# Patient Record
Sex: Male | Born: 1975
Health system: Southern US, Community
[De-identification: ages and names within clinical notes are randomized; demographics above are authoritative.]

## PROBLEM LIST (undated history)

## (undated) DIAGNOSIS — T8859XA Other complications of anesthesia, initial encounter: Secondary | ICD-10-CM

## (undated) DIAGNOSIS — Z9889 Other specified postprocedural states: Secondary | ICD-10-CM

## (undated) DIAGNOSIS — I429 Cardiomyopathy, unspecified: Secondary | ICD-10-CM

## (undated) DIAGNOSIS — I472 Ventricular tachycardia, unspecified: Secondary | ICD-10-CM

## (undated) DIAGNOSIS — H9193 Unspecified hearing loss, bilateral: Secondary | ICD-10-CM

## (undated) DIAGNOSIS — R112 Nausea with vomiting, unspecified: Secondary | ICD-10-CM

## (undated) DIAGNOSIS — E669 Obesity, unspecified: Secondary | ICD-10-CM

## (undated) DIAGNOSIS — I219 Acute myocardial infarction, unspecified: Secondary | ICD-10-CM

## (undated) DIAGNOSIS — I10 Essential (primary) hypertension: Secondary | ICD-10-CM

## (undated) DIAGNOSIS — F419 Anxiety disorder, unspecified: Secondary | ICD-10-CM

## (undated) DIAGNOSIS — Z952 Presence of prosthetic heart valve: Secondary | ICD-10-CM

## (undated) DIAGNOSIS — R55 Syncope and collapse: Secondary | ICD-10-CM

## (undated) DIAGNOSIS — K529 Noninfective gastroenteritis and colitis, unspecified: Secondary | ICD-10-CM

## (undated) DIAGNOSIS — Z95810 Presence of automatic (implantable) cardiac defibrillator: Secondary | ICD-10-CM

## (undated) DIAGNOSIS — R011 Cardiac murmur, unspecified: Secondary | ICD-10-CM

## (undated) DIAGNOSIS — Q210 Ventricular septal defect: Secondary | ICD-10-CM

## (undated) DIAGNOSIS — I499 Cardiac arrhythmia, unspecified: Secondary | ICD-10-CM

## (undated) HISTORY — PX: CORONARY ANGIOPLASTY: SHX604

## (undated) HISTORY — DX: Syncope and collapse: R55

## (undated) HISTORY — DX: Unspecified hearing loss, bilateral: H91.93

## (undated) HISTORY — PX: CARDIAC VALVE REPLACEMENT: SHX585

## (undated) HISTORY — PX: CORONARY ARTERY BYPASS GRAFT: SHX141

## (undated) HISTORY — DX: Ventricular tachycardia: I47.2

## (undated) HISTORY — PX: CARDIAC DEFIBRILLATOR PLACEMENT: SHX171

## (undated) HISTORY — PX: CHOLECYSTECTOMY: SHX55

## (undated) HISTORY — PX: AORTIC VALVE REPLACEMENT: SHX41

## (undated) HISTORY — DX: Ventricular tachycardia, unspecified: I47.20

## (undated) HISTORY — PX: INSERT / REPLACE / REMOVE PACEMAKER: SUR710

## (undated) HISTORY — PX: VSD REPAIR: SHX276

## (undated) HISTORY — DX: Ventricular septal defect: Q21.0

## (undated) HISTORY — DX: Cardiomyopathy, unspecified: I42.9

## (undated) HISTORY — PX: ANGIOPLASTY: SHX39

## (undated) HISTORY — PX: CARDIAC CATHETERIZATION: SHX172

## (undated) HISTORY — DX: Presence of prosthetic heart valve: Z95.2

## (undated) HISTORY — DX: Obesity, unspecified: E66.9

## (undated) HISTORY — DX: Noninfective gastroenteritis and colitis, unspecified: K52.9

## (undated) HISTORY — DX: Anxiety disorder, unspecified: F41.9

---

## 2006-06-28 ENCOUNTER — Other Ambulatory Visit: Payer: Self-pay

## 2006-06-29 ENCOUNTER — Other Ambulatory Visit: Payer: Self-pay

## 2006-06-29 ENCOUNTER — Ambulatory Visit: Payer: Self-pay | Admitting: Internal Medicine

## 2006-06-29 ENCOUNTER — Inpatient Hospital Stay: Payer: Self-pay | Admitting: Internal Medicine

## 2006-06-30 ENCOUNTER — Other Ambulatory Visit: Payer: Self-pay

## 2006-07-01 ENCOUNTER — Other Ambulatory Visit: Payer: Self-pay

## 2006-07-03 ENCOUNTER — Inpatient Hospital Stay (HOSPITAL_COMMUNITY): Admission: EM | Admit: 2006-07-03 | Discharge: 2006-07-06 | Payer: Self-pay | Admitting: Emergency Medicine

## 2006-07-03 ENCOUNTER — Ambulatory Visit: Payer: Self-pay | Admitting: Internal Medicine

## 2006-07-25 ENCOUNTER — Ambulatory Visit: Payer: Self-pay

## 2006-10-15 ENCOUNTER — Ambulatory Visit: Payer: Self-pay | Admitting: Internal Medicine

## 2006-11-13 ENCOUNTER — Ambulatory Visit: Payer: Self-pay | Admitting: Internal Medicine

## 2006-12-30 ENCOUNTER — Ambulatory Visit: Payer: Self-pay | Admitting: Internal Medicine

## 2007-03-11 ENCOUNTER — Emergency Department: Payer: Self-pay | Admitting: Emergency Medicine

## 2007-03-12 ENCOUNTER — Ambulatory Visit: Payer: Self-pay | Admitting: Internal Medicine

## 2007-03-12 ENCOUNTER — Inpatient Hospital Stay: Payer: Self-pay | Admitting: Surgery

## 2007-04-03 ENCOUNTER — Ambulatory Visit: Payer: Self-pay | Admitting: Internal Medicine

## 2007-07-08 ENCOUNTER — Ambulatory Visit: Payer: Self-pay | Admitting: Internal Medicine

## 2007-07-08 ENCOUNTER — Ambulatory Visit: Payer: Self-pay

## 2007-07-08 ENCOUNTER — Encounter: Payer: Self-pay | Admitting: Internal Medicine

## 2007-10-09 ENCOUNTER — Ambulatory Visit: Payer: Self-pay | Admitting: Internal Medicine

## 2008-01-08 ENCOUNTER — Ambulatory Visit: Payer: Self-pay | Admitting: Internal Medicine

## 2008-04-08 ENCOUNTER — Ambulatory Visit: Payer: Self-pay | Admitting: Internal Medicine

## 2008-06-28 ENCOUNTER — Ambulatory Visit: Payer: Self-pay | Admitting: Internal Medicine

## 2008-06-28 ENCOUNTER — Encounter (INDEPENDENT_AMBULATORY_CARE_PROVIDER_SITE_OTHER): Payer: Self-pay | Admitting: *Deleted

## 2008-09-30 ENCOUNTER — Ambulatory Visit: Payer: Self-pay | Admitting: Internal Medicine

## 2008-11-24 ENCOUNTER — Telehealth: Payer: Self-pay | Admitting: Internal Medicine

## 2008-12-30 ENCOUNTER — Ambulatory Visit: Payer: Self-pay | Admitting: Internal Medicine

## 2009-03-09 DIAGNOSIS — Z8679 Personal history of other diseases of the circulatory system: Secondary | ICD-10-CM | POA: Insufficient documentation

## 2009-03-09 DIAGNOSIS — I429 Cardiomyopathy, unspecified: Secondary | ICD-10-CM | POA: Insufficient documentation

## 2009-03-31 ENCOUNTER — Ambulatory Visit: Payer: Self-pay | Admitting: Internal Medicine

## 2009-04-04 ENCOUNTER — Encounter: Payer: Self-pay | Admitting: Internal Medicine

## 2009-04-13 ENCOUNTER — Encounter: Payer: Self-pay | Admitting: Internal Medicine

## 2009-08-08 ENCOUNTER — Ambulatory Visit: Payer: Self-pay | Admitting: Internal Medicine

## 2009-08-08 DIAGNOSIS — Z952 Presence of prosthetic heart valve: Secondary | ICD-10-CM | POA: Insufficient documentation

## 2009-11-10 ENCOUNTER — Ambulatory Visit: Payer: Self-pay | Admitting: Internal Medicine

## 2009-11-11 ENCOUNTER — Encounter: Payer: Self-pay | Admitting: Internal Medicine

## 2010-02-09 ENCOUNTER — Ambulatory Visit: Payer: Self-pay | Admitting: Internal Medicine

## 2010-05-18 ENCOUNTER — Ambulatory Visit: Payer: Self-pay | Admitting: Internal Medicine

## 2010-05-19 ENCOUNTER — Encounter: Payer: Self-pay | Admitting: Internal Medicine

## 2010-05-31 ENCOUNTER — Encounter (INDEPENDENT_AMBULATORY_CARE_PROVIDER_SITE_OTHER): Payer: Self-pay | Admitting: *Deleted

## 2010-06-20 NOTE — Cardiovascular Report (Signed)
Summary: Office Visit Remote   Office Visit Remote   Imported By: Roderic Ovens 11/29/2009 13:31:57  _____________________________________________________________________  External Attachment:    Type:   Image     Comment:   External Document

## 2010-06-20 NOTE — Assessment & Plan Note (Signed)
Summary: F/U 1 YEAR  Medications Added ASPIRIN 81 MG TBEC (ASPIRIN) Take one tablet by mouth every other day      Allergies Added: ! * SHELLFISH  Visit Type:  Follow-up Primary Provider:  Dr Orvan Falconer  CC:  no complaints.  History of Present Illness:   [name] is seen in followup for ventricular tachycardia associated with syncope. This occurred in the context of a repaired VSD and he also has aortic valve replacement with a prosthetic valve. At the time of that surgery he had damage to his right coronary artery he underwent vein angioplasty. He has modest depression of LV function with echo in 2009 demonstrating ejection fraction of 35-45%  He has had no complaints of chest pain or shortness of breath. There've been no intercurrent ICD discharges.  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 Take One Capsule By Mouth Daily 2)  Carvedilol 25 Mg Tabs (Carvedilol) .... Take One Tablet By Mouth Two Times A Day 3)  Aspirin 81 Mg Tbec (Aspirin) .... Take One Tablet By Mouth Every Other Day  Allergies (verified): 1)  ! * Shellfish  Past History:  Family History: Last updated: 03/09/2009 Family History of Hypertension:   Social History: Last updated: 03/09/2009 Full Time Married  Tobacco Use - No.  Alcohol Use - no  Risk Factors: Smoking Status: never (03/09/2009)  Past Medical History: secondary cardiomyopathy ventriculoseptal defect Aortic valve requiring  replacement specifics not available syncope Ventricular tachycardia Status post ICD implantation  Past Surgical History: VSD repair Aortic valve bioprosthesis RCA repair with a vein  angioplasty following injury with the aforementioned surgery  Vital Signs:  Patient profile:   35 year old male Height:      69.5 inches Weight:      187.50 pounds BMI:     27.39 Pulse rate:   67 / minute Pulse rhythm:   regular BP sitting:   110 / 82  (left arm) Cuff size:   large  Vitals Entered By: Mercer Pod (August 08, 2009 10:57 AM)  Physical Exam  General:  The patient was alert and oriented in no acute distress. HEENT Normal.  Neck veins were flat, carotids were brisk.  Lungs were clear.  Heart sounds were regular without murmurs or gallops.  Abdomen was soft with active bowel sounds. There is no clubbing cyanosis or edema. Skin Warm and dry    EKG  Procedure date:  08/08/2009  Findings:      sinus rhythm at 67 Intervals 0.17/0.11/0.41 axis of 60 there is evidence of ventricular hypertrophy with repolarization abnormalities   ICD Specifications Following MD:  Sherryl Manges, MD     ICD Vendor:  [facility] [employer]     ICD Model Number:  T175     ICD Serial Number:  [id number] ICD DOI:  07/04/2006     ICD Implanting MD:  Sherryl Manges, MD  Lead 1:    Location: RV     DOI: 07/04/2006     Model #: [id number]     Serial #: [id number]     Status: active  Indications::  VT   ICD Follow Up Remote Check?  No Battery Voltage:  2.99 V  Charge Time:  8.2 seconds     Battery Est. Longevity:  BOL Underlying rhythm:  SR ICD Dependent:  No       ICD Device Measurements Right Ventricle:  Amplitude: 18.9 mV, Impedance: 675 ohms, Threshold: 1.0 V at 0.5 msec Shock Impedance: 45 ohms   Episodes Shock:  0     ATP:  0     Nonsustained:  2     Ventricular Pacing:  0%  Brady Parameters Mode VVI     Lower Rate Limit:  40      Tachy Zones VF:  240     VT:  200     Next Remote Date:  11/10/2009     Next Cardiology Appt Due:  07/20/2010 Tech Comments:  No parameter changes.  1 VF episode aborted, 2 NSVT.  He will continue with his Latitude transmissions every 3 months and return in  1 year to the [place] office with Dr. Graciela Husbands. Altha Harm, LPN  August 08, 2009 11:19 AM   Impression & Recommendations:  Problem # 1:  VENTRICULAR TACHYCARDIA (ICD-427.1) Patient had recurrent nonsustained ventricular tachycardia identified by his ICD with minimum cycle length of 230 ms. It spontaneously terminated after proximally 6-7 seconds  Problem # 2:  CARDIOMYOPATHY, SECONDARY (ICD-425.9) is well compensated on his current medications. When we see him next year we'll obtain a 2-D echo. Based on the emphasis trauma maybe a role for additional Aldactone. His updated medication list for this problem includes:    Ramipril 5 Mg Caps (Ramipril) .Marland Kitchen... Take one capsule by mouth daily    Carvedilol 25 Mg Tabs (Carvedilol) .Marland Kitchen... Take one tablet by mouth two times a day    Aspirin 81 Mg Tbec (Aspirin) .Marland Kitchen... Take one tablet by mouth every other day  Problem # 3:  IMPLANTABLE  DEFIBRILLATOR  GDT (ICD-V45.02) Device parameters and data were reviewed and no changes were made

## 2010-06-22 NOTE — Letter (Signed)
Summary: Remote Device Check  [facility], Main Office  [address]   [place], [place] [address]   Phone: [phone]  Fax: [phone]     May 31, 2010 MRN: [id number]   [name] [address] [place], [place]  [address]   Dear Mr. Moose,   Your remote transmission was recieved and reviewed by your physician.  All diagnostics were within normal limits for you.   __X____Your next office visit is scheduled for:  March 2012 with Dr Graciela Husbands. Please call our office to schedule an appointment.    Sincerely,  Vella Kohler

## 2010-06-22 NOTE — Letter (Signed)
Summary: Remote Device Check  [facility], Main Office  [address]   [place], [place] [address]   Phone: [phone]  Fax: [phone]     May 31, 2010 MRN: [id number]   [name] [address] [place], [place]  [address]   Dear [name],   Your remote transmission was recieved and reviewed by your physician.  All diagnostics were within normal limits for you.  _____Your next transmission is scheduled for:                       .  Please transmit at any time this day.  If you have a wireless device your transmission will be sent automatically.  ______Your next office visit is scheduled for:                              . Please call our office to schedule an appointment.    Sincerely,  Vella Kohler

## 2010-06-22 NOTE — Cardiovascular Report (Signed)
Summary: Office Visit Remote   Office Visit Remote   Imported By: Roderic Ovens 06/02/2010 11:06:34  _____________________________________________________________________  External Attachment:    Type:   Image     Comment:   External Document

## 2010-08-24 ENCOUNTER — Ambulatory Visit (INDEPENDENT_AMBULATORY_CARE_PROVIDER_SITE_OTHER): Payer: 59 | Admitting: *Deleted

## 2010-08-24 DIAGNOSIS — Z95810 Presence of automatic (implantable) cardiac defibrillator: Secondary | ICD-10-CM

## 2010-08-24 DIAGNOSIS — I472 Ventricular tachycardia, unspecified: Secondary | ICD-10-CM

## 2010-08-24 DIAGNOSIS — R0989 Other specified symptoms and signs involving the circulatory and respiratory systems: Secondary | ICD-10-CM

## 2010-08-24 DIAGNOSIS — I428 Other cardiomyopathies: Secondary | ICD-10-CM

## 2010-08-28 ENCOUNTER — Other Ambulatory Visit: Payer: Self-pay

## 2010-08-28 NOTE — Progress Notes (Signed)
icd remote check  

## 2010-09-10 ENCOUNTER — Encounter: Payer: Self-pay | Admitting: *Deleted

## 2010-09-20 ENCOUNTER — Other Ambulatory Visit: Payer: Self-pay | Admitting: *Deleted

## 2010-09-20 MED ORDER — RAMIPRIL 5 MG PO CAPS: 5.0000 mg | ORAL_CAPSULE | Freq: Every day | ORAL | Status: DC

## 2010-09-20 MED ORDER — CARVEDILOL 25 MG PO TABS: 25.0000 mg | ORAL_TABLET | Freq: Two times a day (BID) | ORAL | Status: DC

## 2010-10-03 NOTE — Progress Notes (Signed)
[facility] OFFICE NOTE   NAME:[name], [name]                       MRN:          [id number]  DATE:06/28/2008                            DOB:          1975-06-19    [name] is seen in followup for syncope in the setting of VSD repair  and a pericardial aortic valve replacement.  He has sustained VT and  underwent ICD implantation.  He has had no recurrent VT.  His exercise  tolerance is quite good.   MEDICATIONS:  1. Altace 5.  2. Aspirin 81.  3. Coreg 25.   On examination, his weight was 196 which is up about 7 pounds in the  last year.  His blood pressure is 118/70, pulse was 65.  His lungs were  clear.  Neck veins were flat.  Heart sounds were regular.  The abdomen  was soft.  The extremities were without edema.   IMPRESSION:  Interrogation of his Guidant vitality ICD demonstrates an R-  wave of 17 with impedance of 657, threshold of 1 volt at 0.5.  High-  voltage impedance was 44.  Battery voltage was 3.15.   There have been no intercurrent therapies.  There is 1 recurrent  nonsustained episode of VT.   IMPRESSION:  1. Ventricular tachycardia with syncope.  2. Congenital heart disease.      a.     Status post ventricular septal defect repair.      b.     Status post aortic valve replacement with prosthetic valve.      c.     Right coronary artery vein angioplasty following damage with       time and surgery.  3. Junctional rhythm, intermittent - today in sinus rhythm.  4. Left ventricular dysfunction with cardiomyopathy.  Ejection      fraction 35%.   [name] is doing really quite well.  His sinus rate is more stable.  We will continue on his current medications with some consideration for  further up titration of Altace at his next visit.  He may well be moving  to [place].  He will let us know at that time if he needs further  Cardiology support.     Duke Salvia, MD,  [facility] [place] [facility]  Electronically Signed    SCK/MedQ  DD: 06/28/2008  DT: 06/29/2008  Job #: [id number]

## 2010-10-03 NOTE — Progress Notes (Signed)
[facility]                  [facility] OFFICE NOTE   NAME:Bates, Fernando                         MRN:          [id number]  DATE:07/08/2007                            DOB:          Dec 20, 1975    [name] is seen in followup for ventricular tachycardia occurring in  the context of congenital heart disease, specifically undergoing aortic  valve replacement for aortic insufficiency in 2002 and subsequently vein  angioplasty for an RCA damaged during that surgery with prior history of  VSD repair.  He has had no recurrent ventricular tachycardia.  He is  doing well without complaints exercise intolerance, shortness of breath,  or chest pain.  He has limited his activities somewhat   CURRENT MEDICATIONS:  1. Altace 5.  2. Aspirin 81.  3. Coreg 25 b.i.d.   PHYSICAL EXAMINATION:  VITAL SIGNS:  On examination today, his blood  pressure is 120/72. His pulse was 78.  His weight was 189.  LUNGS:  Clear.  HEART:  Sounds were regular with an S1 that was variable.  ABDOMEN:  Soft.  EXTREMITIES:  Without edema.   Interrogation of Guidant Vitality ICD demonstrates R-wave of 20 with  impedance of 663, a threshold of 1 volt at 0.5.  Battery voltage 3.2.  There were  new intercurrent therapies.   Electrocardiogram demonstrated junctional rhythm at 81 with intervals of  -/012/0.40. The axis was 60 degrees.  There is evidence of two P waves  in lead III on the first two beats of the tracing potentially coming out  prior to the third beat on the tracing.  After that, I could not see it  along the rhythm strip.   IMPRESSION:  1. Ventricular tachycardia with syncope.  2. Congenital heart disease.      a.     Status post VSD repair.      b.     Status post aortic valve replaced with a prosthetic valve.      c.     Status post right coronary artery vein patch angioplasty       following damage at time of surgery.  3. Junctional rhythm with  question sinus node slowing versus      intermittent sinus node arrest.  4. Cardiomyopathy with ejection fraction previously 35%.   We will plan to check an echocardiogram today to see what his LV  function looks like.  We will continue with current medications as I  think the benefits of Coreg outweigh the potential medication's of  accelerated junctional rhythm.   He has had essentially no ventricular pacing, so while the PP interval  is somewhat shorter than the RR interval, antegrade conduction block may  be evident at times. Ventricular pacing as opposed to junctional escape  is not an issue.   We will see him again in six months' time.  I should note that he is  thinking about moving to [place] Askew, MD, [facility]  Electronically Signed    SCK/MedQ  DD: 07/08/2007  DT: 07/09/2007  Job #: [id number]

## 2010-10-03 NOTE — Assessment & Plan Note (Signed)
[facility]                         ELECTROPHYSIOLOGY OFFICE NOTE   NAME:[name], [name]                       MRN:          [id number]  DATE:11/13/2006                            DOB:          1976/01/04    [name] is seen today.  He has a history of syncope with an ischemic  cardiomyopathy, status post aortic valve replacement, status post VSD  repair and a myocardial infarction that was related, I think, to damage  at the time of one of his surgeries with a coronary artery patch repair.  He is doing quite well; he has no symptoms currently.   MEDICATIONS CURRENTLY INCLUDE:  1. Coreg 12.5 b.i.d.  2. Altace.  3. Aspirin.   PHYSICAL EXAMINATION:  His blood pressure is 110/78 and pulse is 72.  LUNGS:  Clear.  HEART:  Sounds were regular.  EXTREMITIES:  Without edema.   We will plan to increase his Coreg today to 18.75 and we will see him  again in six weeks.  I have asked him to increase it from 18.75 to 25,  if he is tolerating it, in four weeks; and we will see where he goes.   I should also note that he has asked the questions as to whether there  is compensation to which he is entitled based on the damage to his heart  from his surgery.  I told him that I did not know enough about surgical  complications to be able to say anything about this, but if that was  something that he wanted to pursue then he certainly had the right to do  that.     Duke Salvia, MD, [facility]  Electronically Signed    SCK/MedQ  DD: 11/13/2006  DT: 11/14/2006  Job #: [id number]   cc:   Dr. Dorothyann Peng

## 2010-10-03 NOTE — Assessment & Plan Note (Signed)
[facility]                         ELECTROPHYSIOLOGY OFFICE NOTE   NAME:[name], [name]                       MRN:          [id number]  DATE:10/15/2006                            DOB:          [birth year]    [name] ELECTROPHYSIOLOGY NOTE:  Mr. [name] is seen.  He has a history of ventricular tachycardia in the  setting of cardiomyopathy with an ejection fraction of 30% that occurred  in the context of VSD repair, aortic valve replacement that was  complicated by a right coronary artery injury that subsequently required  grafting.  He is status post ICD implantation and is currently taking  Coreg 6.25 and Altace 5 and aspirin 81.   PHYSICAL EXAMINATION:  VITAL SIGNS:  His blood pressure is 137/88, pulse  of 90.  LUNGS:  Clear.  HEART:  Heart sounds were regular.  EXTREMITIES:  Without edema.   Interrogation of his Guidant Vitality T175 ICD indicates an R wave of 22  and impedance of 639, threshold of 0.4 at 0.5.  Battery voltage is 3.23.   IMPRESSION:  1. Ventricular tachycardia.  2. Complex cardiomyopathy.      a.     Status post VSD repair.      b.     Status post aortic valve replacement.      c.     Status post right coronary artery patch repair.      d.     Ejection fraction of 30%.   Mr. [name] is doing well.  Will increase his Coreg to 9.375, and I will  see him again in 4 weeks time to further uptitrate that.     Duke Salvia, MD, [facility]  Electronically Signed    SCK/MedQ  DD: 10/15/2006  DT: 10/15/2006  Job #: [id number]   cc:   Dorothyann Peng, M.D.

## 2010-10-06 NOTE — Op Note (Signed)
NAMECORNELLIUS, KROPP                ACCOUNT NO.:  [id number]   MEDICAL RECORD NO.:  [id number]          PATIENT TYPE:  INP   LOCATION:  3730                         FACILITY:  [facility]   PHYSICIAN:  Duke Salvia, MD, FACCDATE OF BIRTH:  January 31, 1976   DATE OF PROCEDURE:  07/04/2006  DATE OF DISCHARGE:                               OPERATIVE REPORT   PREOPERATIVE DIAGNOSIS:  Ischemic and nonischemic cardiomyopathy, status  post aortic valve replacement, status post ventricular septal defect  repair with ventricular tachycardia and syncope.   POSTOPERATIVE DIAGNOSIS:  Ischemic and nonischemic cardiomyopathy,  status post aortic valve replacement, status post ventricular septal  defect repair with ventricular tachycardia and syncope.   OPERATION/PROCEDURE:  Defibrillator implantation with intraoperative  defibrillation threshold testing.   DESCRIPTION OF PROCEDURE:  Following obtaining informed consent, the  patient was brought to the electrophysiology laboratory and placed on  the fluoroscopic table in supine position.  After routine prep and drape  of the left upper chest, lidocaine was infiltrated in the prepectoral  subclavicular region.  Incision was made and carried down to the layer  of the prepectoral fascia using electrocautery and sharp dissection.  A  pocket was formed similarly.  Hemostasis was obtained.   Thereafter, attention was turned to gain access to the extrathoracic  left subclavian vein which was accomplished with a moderate of  difficulty.  I actually punctured the artery on one occasion and held  pressure for 2 minutes.  I was then able to access the vein and  guidewire was placed and retained and a 9.5-French sheath was placed  through which was then passed a Guidant [employer]  covered lead, serial number [id number].  With a moderate amount of  difficulty, this lead was finally placed in the right ventricular apex.  In this location the bipolar  R wave was 12.1 with a pace impedance of  717 and a threshold of 1 volt at 0.5 without diaphragmatic stimulation  at 10 volts.  The lead was secured in the prepectoral fascia and then  attached to a Guidant Vitality 2T175 ICD, serial number [id number].  Through  the device bipolar R wave was 9.5 with a pace impedance of 657,  threshold of 0.5 milliseconds.  The high-voltage impedance was 36 ohms.   Defibrillation threshold testing was undertaken.  Ventricular  fibrillation was induced via the T-wave shock.  After a total duration  of 5 seconds a 14-joule shock was delivered through a measured  resistance of 34 ohms, failing to terminate ventricular fibrillation.  After a total duration of 11 seconds, a 23-joule shock was delivered  through a measured resistance of 35 ohms, terminating ventricular  fibrillation, restoring sinus rhythm.   After wait of 5-6 minutes, ventricular fibrillation was reinduced via  the T-wave shock.  After a total duration of about 8 seconds, a 23-joule  shock was delivered through a measured resistance of 35 ohms, failing to  terminate ventricular fibrillation.  Then a 316-joule shock was  delivered externally to rescue and restore sinus rhythm.   After waiting 6 minutes, ventricular fibrillation was reinduced.  After  a total duration of about 6 seconds, a 23-joule shock was delivered  through a measured resistance of 35 ohms through a reversed polarity,  terminating ventricular fibrillation, restoring sinus rhythm.  At this  point the device was implanted.  The pocket was copiously irrigated with  antibiotic-containing saline solution.  Hemostasis was assured.  The  lead and the pulse generator were placed in the pocket, secured to the  prepectoral fascia.  The wound was closed in three layers in normal  fashion.  The wound was  washed and dried and a Benzoin and Steri-Strip dressing was applied.  Needle counts, sponge counts and instrument counts were correct  at the  end of the procedure according to staff.  The patient tolerated the  procedure without apparent complication.      Duke Salvia, MD, [facility]  Electronically Signed     SCK/MEDQ  D:  07/04/2006  T:  07/05/2006  Job:  [id number]   cc:   Dr. Gerda Diss _____________  [facility]

## 2010-10-06 NOTE — Consult Note (Signed)
[name], [name]                ACCOUNT NO.:  [id number]   MEDICAL RECORD NO.:  [id number]          PATIENT TYPE:  INP   LOCATION:  3730                         FACILITY:  [facility]   PHYSICIAN:  Heloise Purpura, MD      DATE OF BIRTH:  [birth year]   DATE OF CONSULTATION:  07/04/2006  DATE OF DISCHARGE:                                 CONSULTATION   REQUESTING PHYSICIAN:  Dr. Duke Salvia.   REASON FOR CONSULTATION:  Right scrotal hematoma.   HISTORY:  [name] is a 35 year old gentleman with a history  significant for heart disease.  He has a history of a ventricular septal  defect repair as well as an aortic valve replacement and a history of a  myocardial infarction.  Recently, he was admitted to [facility]  after an acute episode where he became syncopal.  He underwent  evaluation including a cardiac catheterization.  As a complication of  his cardiac catheterization, he did develop a retroperitoneal hematoma  as well as a right scrotal hematoma.  He developed right scrotal pain  and a scrotal ultrasound was performed.  This demonstrated blood flow to  the right testis.  He was therefore taken for inguinal exploration by  Dr. Irineo Axon.  Per the patient's report, the testis was able to be  salvaged.  Since his operation, he has decreased pain in the right  testis.  Although he does still have some discomfort in the region, it  is not nearly as painful as it initially was.  The patient states that  he voids well without complications.  He denies a history of hematuria,  urinary tract infections, urolithiasis, GU malignancy, trauma or  surgery.   PAST MEDICAL HISTORY:  1. History of ventricular septal defect status post repair.  2. Aortic valve repair in 2002.  3. History of myocardial infarction with current ejection fraction of      approximately 30%.   MEDICATIONS:  1. Aspirin.  2. Coreg.  3. Cephazolin.  4. Codeine.  5. Altace.  6. Senokot.  7.  Bactrim.  8. Kanamycin.  9. Lopressor.   ALLERGIES:  SHELLFISH.   FAMILY HISTORY:  Hypertension.   SOCIAL HISTORY:  The patient works as a Engineer, civil (consulting).  He is  married.  He denies tobacco use.   REVIEW OF SYSTEMS:  A complete review of systems was performed.  Pertinent positives are as in history of present illness.  All other  systems are reviewed and otherwise negative.   PHYSICAL EXAM:  Temperature 98.4, heart rate 74, blood pressure 135/84.  Urine output 1200 mL.  CONSTITUTIONAL:  Alert and oriented in no acute distress.  ABDOMEN:  Soft, nondistended, nontender without abdominal masses.  GU:  The patient has a well-healed right inguinal incision.  He has a  normal male phallus without urethral discharge, blood at the urethral  meatus.  He has a normal left testis which is palpably normal without  tenderness or masses.  His right scrotum demonstrates edema with  findings consistent with a large hematoma.  His right testis is not able  to be well palpated.  There are no scrotal skin lesions or penile skin  lesions.   LABORATORY STUDIES:  Serum creatinine 0.9, hemoglobin 11.6.   Outside records:  The patient's outside scrotal ultrasound report was  reviewed which demonstrated no significant blood flow to the right  testis.  Dr. Heywood Footman note is also reviewed in preparation for his  inguinal exploration.  The patient's operative note is not available for  review.   IMPRESSION:  Right scrotal hematoma with history of testicular ischemia,  status post exploration and evacuation of hematoma.   RECOMMENDATIONS:  As the patient currently is not having increased pain  in the right testis, I would recommend a scrotal ultrasound tomorrow to  reevaluate his testicular blood flow.  If the patient does develop acute  increased scrotal pain, a scrotal ultrasound should be obtained on an  urgent basis.  Otherwise, if good flow is demonstrated, the patient can  followup as  scheduled with Dr. Lonna Cobb for further postoperative care.   Thank you very much for this consultation and the opportunity to care  for this nice gentleman.           ______________________________  Heloise Purpura, MD  Electronically Signed     LB/MEDQ  D:  07/04/2006  T:  07/05/2006  Job:  [id number]

## 2010-10-06 NOTE — Discharge Summary (Signed)
[name], [name]                ACCOUNT NO.:  [id number]   MEDICAL RECORD NO.:  [id number]          PATIENT TYPE:  INP   LOCATION:  3730                         FACILITY:  [facility]   PHYSICIAN:  Duke Salvia, MD, FACCDATE OF BIRTH:  [birth year]   DATE OF ADMISSION:  07/03/2006  DATE OF DISCHARGE:  07/04/2006                               DISCHARGE SUMMARY   ALLERGIES:  SHELLFISH AND DERIVATIVES OF SHELLFISH.   PAST HISTORY PERTINENT TO THIS ADMISSION:  1. VSD repair at age 3.  2. Pericardial tissue aortic valve replacement in 2002 for aortic      insufficiency.  The patient required vein patch angioplasty of the      right coronary artery, which was damaged during dissection down to      the heart secondary to adhesions from BSD repair.   1. History at [facility] from February 9 to February      13.  A:  Admitted with palpitations leading to syncope.  B:  VT on EMS strips requiring DCV 100 J.  C:  A 2-D echocardiogram at [facility] showed ejection fraction 30%.  D:  Abnormal stress test.  E:  Catheterization.  The patient has a large diaphragmatic wall motion  abnormality, ejection fraction 30%.  F:  Post catheterization retroperitoneal bleed with extension to the  scrotum.  G:  Right testicular pain/no flow on ultrasound of right testis February  12.  H:  Status post evacuation of hematoma right scrotum and inguinal tract.   1. History at [facility] from February 13 to      February 16.  A:  Transferred from [facility] to [facility] February 13.  B:  Electrophysiology studies/status post implantation of a Guidant  Vitality 2 single chamber cardioverted defibrillator.  Dr. Sherryl Manges  defibrillator threshold study less than or equal to 23 jewels.  This was  done July 04, 2006.  C:  Neurology consult, Dr. Heloise Purpura.  Scrotal ultrasound was  ordered.  Acceptable blood flow in the right testes, residual  circumferential  hematoma in the right scrotum.  D:  CT angiogram was performed here at [facility] February 15.  Results  are pending.   PROCEDURE:  1. July 04, 2006:  Implant single chamber cardioverted      defibrillator, Dr. Graciela Husbands.  Defibrillator threshold study less than      or equal 23 jewels.  2. Scrotal ultrasound with good flow to the right testes.  Residual      hematoma in the scrotum.  3. CT angiogram, July 05, 2006.  Results are pending.   BRIEF HISTORY:  [name] is a 35 year old male.  He is sitting on an  extensive prior cardiac history.  The patient had a VSD repair at age 72  in [place], [place].  He underwent an aortic valve repair in  2002 for aortic insufficiency.  Somewhere during this period, the  patient sustained a diaphragmatic myocardial infarction, for which he  did not seek medical attention.   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   The patient was in his usual state of health until he presented to the  emergency room at [facility].  He had complained of palpitations, and  then became unconscious.  He regained consciousness within 30 seconds.  Paramedics were called.  On arrival, paramedics found a wide QRS  tachycardia with cycle length of 260 milliseconds.  He underwent urgent  DC cardioversion, which restored sinus rhythm.  The patient was admitted  to [facility].  He had a stress test, which was abnormal.  He  underwent left heart catheterization.  The right coronary artery could  not be engaged during the catheterization, but apparently the left side  was free of occlusive coronary disease.  His ejection fraction by  echocardiogram and catheterization were both said to be 30%.  The  studies also found a large diaphragmatic wall motion abnormality.  Post  catheterization, there was a complication of large retroperitoneal  hematoma with extension  into the scrotum.  He subsequently underwent  evacuation and repair and hematoma in the scrotum, which caused the  ischemic insult to right testes characterized by continued right  testicular pain in this patient.   The patient is admitted for additional evaluation here at [facility],  including CT angiography, electrophysiology study, and likely a  cardioverted defibrillator implantation.   HOSPITAL COURSE:  The patient was admitted and transferred from [facility] on February 13 for CT angiography and electrophysiology study.  The op report from 2003 when the patient had aortic valve replacement  for aortic insufficiency was examined.  It was noted that the patient  received iatrogenic injury to the right coronary artery, for which a  vein patch angioplasty was required.  There was a comment by Dr. Graciela Husbands  on the chart here that there was a nanomelous takeoff of the right  coronary artery between the great vessels.  The patient underwent  electrophysiology study with implantation of cardioverted defibrillator,  a Guidant Vitality 2 single chamber defibrillator on February 14.  Defibrillator threshold study was less than or equal to 23 jewels.  The  patient did have inducible VT/V flutter.  The patient was seen also on  February 14 by Dr. Heloise Purpura, a urologist.  He recommended a repeat  of the scrotal ultrasound.  The study showed that there was good blood  flow to the left testes, and there was residual scrotal hematoma  circumferential to the testes.  On the morning of February 15, the  patient also had CT angiogram.  The results of this study are pending.  The patient has been maintained on Bactrim-DS for 2 out of the 7 days  that was recommended that he pursue this course.   He also is having ongoing constipation.  He has not had a bowel movement  since Thursday, February 7, and efforts will be made to stimulate bowel function once again prior to his discharge.  He has been  having on  February 15, intermittent spikes of fever.  Temperature was 100.9 in the  morning, spiked again at 101 during the day.  In the afternoon,  temperature was 99.  Dr. Graciela Husbands was consulted and thought that maybe this  would be secondary to the large hematoma this patient has sustained with  pyogens release.  He will be kept until February 16.  Enemas and mag  sulfate will be applied.  The patient also apparently does  given himself  enemas at home.  He was admitted on metoprolol, and this has been  changed to Coreg this hospitalization.   DISCHARGE MEDICATIONS:  He discharged on the following medications:  1. Altace 5 mg daily.  2. Enteric coated aspirin 81 mg daily.  3. Bactrim-DS 1 tab in the morning, 1 tab in the evening for 5 more      days.  4. Coreg 6.25 mg twice daily.   FOLLOWUP:  He has an office visit with Dr. Juliann Pares Friday, February 22  at 10:00 in the morning.  The patient knows to get in touch with Dr.  Lonna Cobb for followup urology visit, and he will have visits pertinent to  implantation of his ICD here at [facility] at [address].  He will see [place] at the ICD clinic in 2 weeks, and he will see  Dr. Graciela Husbands in 3 months.  Dr. Odessa Fleming office will call with those office.   Discharge greater than 45 minutes.  In addition, the patient has had  discussion of mobility of the arm affected by the cardioverted  defibrillator.  He is asked not to drive for the next 6 months.  He is  to keep the incision dry for the next 7 days, and then sponge bathe  until Thursday, February 21.      Maple Mirza, [place]      Duke Salvia, MD, [facility]  Electronically Signed    GM/MEDQ  D:  07/05/2006  T:  07/07/2006  Job:  [id number]   cc:   Duke Salvia, MD, [facility]  Dr. Juliann Pares  Dr. Orvan Falconer

## 2010-10-06 NOTE — Op Note (Signed)
[name], [name]                ACCOUNT NO.:  [id number]   MEDICAL RECORD NO.:  [id number]          PATIENT TYPE:  INP   LOCATION:  3730                         FACILITY:  [facility]   PHYSICIAN:  Duke Salvia, MD, FACCDATE OF BIRTH:  November 13, 1975   DATE OF PROCEDURE:  07/05/2006  DATE OF DISCHARGE:                               OPERATIVE REPORT   PREOPERATIVE DIAGNOSIS:  Wide-complex tachycardia with prior  ventriculoseptal defect repair and inferior infarct question mechanism  with known anomalous right coronary artery between the great vessels as  well as injury to the right coronary artery at the time of his aortic  valve replacement.   POSTOPERATIVE DIAGNOSIS:  Easily reducible ventricular tachycardia.   DESCRIPTION OF PROCEDURE:  The patient was submitted for noninvasive  elective physiological testing via his ICD.  The R wave was 14.3.  The  impedance was 598.  Threshold was 0.4 volts at 0.5 msec with a high  voltage impedance of 37 ohms.   Program simulation was undertaken via the defibrillator.  Using long  shorts at 400:550:250:210 monomorphic ventricular tachycardia/flutter  was easily inducible.  We should note that the patient's clinical  tachycardia had a cycle length of 240 msec.  This tachycardia that was  induced had a cycle length of 228-230 msec.  It was terminated with  shock therapy.   The patient tolerated the procedure without apparent complication.      Duke Salvia, MD, [facility]  Electronically Signed     SCK/MEDQ  D:  07/05/2006  T:  07/06/2006  Job:  [id number]   cc:   Dr. Denyse Amass  Electrophysiology Laboratory  [facility]

## 2010-10-06 NOTE — Assessment & Plan Note (Signed)
[facility]                         ELECTROPHYSIOLOGY OFFICE NOTE   NAME:Stansel, JARIUS DIEUDONNE                       MRN:          [id number]  DATE:07/25/2006                            DOB:          08-24-1975    Mr. Richman was seen today in the clinic on the 6th of March, 2008, for  follow up wound check of his newly implanted Guidant Model #T175  Vitality.  Date of implant was July 04, 2006, for ischemic  cardiomyopathy.  On interrogation of his device today, his battery  voltage was 3.24 with a charge time of 6.9 seconds.  R waves measures  17.3 mV with a ventricular capture threshold of less than 0.2 v at 0.5  milliseconds and a ventricular lead impedance of 542 ohms, shock  impedance was 41.  There were 4 induced VF shocks and 1 diverted; again,  all induced at the time of implant, no other episodes noted.  His Steri-  Strips were removed today, his wound was without redness or edema and he  will follow up with Dr. Graciela Husbands in May.      Altha Harm, LPN  Electronically Signed      Duke Salvia, MD, [facility]  Electronically Signed   PO/MedQ  DD: 07/25/2006  DT: 07/25/2006  Job #: [id number]

## 2010-10-06 NOTE — Assessment & Plan Note (Signed)
[facility]                                 ON-CALL NOTE   NAME:LEWISTylor, Courtwright                       MRN:          [id number]  DATE:05/26/2007                            DOB:          01/09/1976    SUPERVISING PHYSICIAN:  Dr. Tenny Craw.   HISTORY:  Mr. Comes called this evening, very upset because his pharmacy  has faxed over authorizations for refill of his Coreg, and it has not  been authorized.  He is calling this evening, because tonight he does  not have a dose.  I have contacted [facility] at [phone] and authorized  refill of Coreg 25 mg twice daily, #60 tablets, with six refills.      Joellyn Rued, PA-C  Electronically Signed      Duke Salvia, MD, [facility]  Electronically Signed   EW/MedQ  DD: 05/26/2007  DT: 05/27/2007  Job #: [id number]

## 2010-10-06 NOTE — H&P (Signed)
[name], [name]                ACCOUNT NO.:  [id number]   MEDICAL RECORD NO.:  [id number]          PATIENT TYPE:  INP   LOCATION:  1846                         FACILITY:  [facility]   PHYSICIAN:  Doylene Canning. Ladona Ridgel, MD    DATE OF BIRTH:  Nov 14, 1975   DATE OF ADMISSION:  07/03/2006  DATE OF DISCHARGE:                              HISTORY & PHYSICAL   The patient is admitted in transfer from [facility] for additional evaluation of a wide QRS tachycardia, anomalous  coronary circulation, history of VSD status post repair, and history of  aortic valve insufficiency status post repair.   HISTORY OF PRESENT ILLNESS:  The patient is a very pleasant 35 year old  man who has an extensive cardiac history.  Basically, the patient  appears to have had a VSD repair at age 62 in [place], [place].  The patient tolerated this nicely.  He underwent aortic valve  repair in 2002, the details of which we also do not know.  Somewhere  during this period of time, the patient sustained a diaphragmatic  myocardial infarction for which he did not seek medical attention.  He  apparently has an anomalous coronary artery.  He was in his usual state  of health until presenting to the emergency department several days ago.  The patient's wife, who is a Engineer, civil (consulting) at [employer], noted that he  complained of palpitations and then became unconscious.  Within about 30  seconds, the patient regained consciousness and the paramedics were  called.  On arrival, he was found to be in a wide QRS tachycardia  described as a right bundle branch block tachycardia at cycle length of  260 milliseconds.  He underwent urgent DC cardioversion restoring sinus  rhythm.  The patient was admitted to the hospital and underwent  catheterization after a stress test was abnormal.  His right coronary  artery could not be engaged during the catheterization, his left system  was apparently free of occlusive  coronary disease, and his EF by echo  and cath were said to be 30%.  There is a large diaphragmatic wall  motion abnormality.  The catheterization was complicated by a large  retroperitoneal hematoma as well as a scrotal hematoma for which he also  had to undergo surgical repair.  He is admitted for additional  evaluation including CT coronary angiography, EP testing, and likely ICD  implantation.  The patient's additional past medical history is  unremarkable.   FAMILY HISTORY:  Notable for hypertension.   SOCIAL HISTORY:  The patient works as a Engineer, civil (consulting).  He is  married.  He denies tobacco or ethanol abuse.  He drinks rarely.   REVIEW OF SYSTEMS:  Negative including no real history of active  congestive heart failure.  He has never had syncope before.  Interestingly enough, the patient does admit to having palpitations in  the past which were apparently worked up but no obvious findings.   PHYSICAL EXAMINATION:  GENERAL:  Pleasant well appearing young man in no  distress.  VITAL SIGNS:  Blood pressure was  130/70, pulse was 80 and regular,  respirations were 18.  HEENT: Normocephalic and atraumatic.  Pupils are round.  Oropharynx  moist.  Sclerae are anicteric.  NECK:  No jugular distension.  There is no thyromegaly.  The trachea is  midline.  Carotid are 2+ and symmetric.  There is no jugular  venous  distension.  LUNGS: Clear bilaterally to auscultation and no wheezes, rales or  rhonchi.  CARDIOVASCULAR:  Exam revealed a grade 2/6 systolic murmur at the right  upper sternal border.  A2 was well heard.  There is a grade 1/6  diastolic murmur at the right upper sternal border.  There was an LV  lift present.  There was no significant murmurs in the apex of the  heart.  The PMI was enlarged and laterally displaced, however.  ABDOMEN:  Soft, nontender.  There is no organomegaly.  Bowel sounds are  present.  There is no rebound or guarding.  EXTREMITIES:  Demonstrated no  cyanosis, clubbing or edema.  There is a  fresh scar in his right groin and the scrotal area.  NEUROLOGIC:  Alert and oriented X3.  Cranial nerves were intact.  Strength is 5/5 and symmetric.   There is no new EKG.  The prior EKG demonstrates a right bundle branch  block tachycardia with positive precordial concordance and a QRS  duration of 140 milliseconds.  The axis was superior.   IMPRESSION:  1. Probable ventricular tachycardia with syncope requiring emergent      defibrillation.  2. History of VSD repair at [place], [place], approximately      16 years ago, details of which are unavailable.  3. History of tissue aortic valve replacement in 2002 at [facility].  4. Anomalous right coronary artery.  5. Diaphragmatic MI, age undetermined.  6. Class I congestive heart failure, EF 30%.  7. Retroperitoneal/scrotal hematoma status post evacuation.   PLAN:  Admit the patient, obtain CT coronary angiography to define his  coronary circulation, and then to proceed with EP study with likely ICD  implantation.      Doylene Canning. Ladona Ridgel, MD  Electronically Signed     GWT/MEDQ  D:  07/03/2006  T:  07/03/2006  Job:  [id number]

## 2010-10-31 ENCOUNTER — Encounter: Payer: Self-pay | Admitting: Internal Medicine

## 2010-11-03 ENCOUNTER — Encounter: Payer: Self-pay | Admitting: Internal Medicine

## 2010-11-06 ENCOUNTER — Encounter: Payer: Self-pay | Admitting: Internal Medicine

## 2010-11-06 ENCOUNTER — Ambulatory Visit (INDEPENDENT_AMBULATORY_CARE_PROVIDER_SITE_OTHER): Payer: 59 | Admitting: Internal Medicine

## 2010-11-06 DIAGNOSIS — I472 Ventricular tachycardia: Secondary | ICD-10-CM

## 2010-11-06 DIAGNOSIS — I2510 Atherosclerotic heart disease of native coronary artery without angina pectoris: Secondary | ICD-10-CM

## 2010-11-06 DIAGNOSIS — I429 Cardiomyopathy, unspecified: Secondary | ICD-10-CM

## 2010-11-06 DIAGNOSIS — Z95810 Presence of automatic (implantable) cardiac defibrillator: Secondary | ICD-10-CM | POA: Insufficient documentation

## 2010-11-06 LAB — BASIC METABOLIC PANEL
CO2: 25 mEq/L (ref 19–32)
Chloride: 106 mEq/L (ref 96–112)
Creat: 0.81 mg/dL (ref 0.50–1.35)

## 2010-11-06 LAB — MAGNESIUM: Magnesium: 1.8 mg/dL (ref 1.5–2.5)

## 2010-11-06 NOTE — Progress Notes (Signed)
  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 This occurred in the context of a repaired VSD and he also has aortic valve replacement with a prosthetic valve. At the time of that surgery he had damage to his right coronary artery he underwent vein angioplasty. He has modest depression of LV function with echo in 2009 demonstrating ejection fraction of 35-45%  He is well except that he is exhaustedas he is going to school and working third shift  Past Medical History  Diagnosis Date  . Secondary cardiomyopathy   . Ventricular septal defect   . Aortic valve replaced     Requiring replacement, specifics not available  . Syncope   . Ventricular tachycardia   . ICD (implantable cardiac defibrillator), single, in situ     S/P implantation    Past Surgical History  Procedure Date  . Vsd repair   . Aortic valve replacement     Bioprosthesis  . Angioplasty     RCA repair with vein angioplasty following injury with the aforementioned surgery    Current Outpatient Prescriptions  Medication Sig Dispense Refill  . carvedilol (COREG) 25 MG tablet Take 1 tablet (25 mg total) by mouth 2 (two) times daily with a meal.  60 tablet  5  . ramipril (ALTACE) 5 MG capsule Take 1 capsule (5 mg total) by mouth daily.  30 capsule  11  . DISCONTD: aspirin 81 MG EC tablet Take 81 mg by mouth daily.          Allergies  Allergen Reactions  . Shellfish Allergy     Review of Systems negative except from HPI and PMH  Physical Exam Well developed and well nourished in no acute distress HENT normal E scleral and icterus clear Neck Supple JVP flat; carotids brisk and full Clear to ausculation Regular rate and rhythm, no murmurs gallops or rub Soft with active bowel sounds No clubbing cyanosis and edema Alert and oriented, grossly normal motor and sensory function Skin Warm and Dry   Assessment and  Plan

## 2010-11-06 NOTE — Assessment & Plan Note (Signed)
We'll reassess his LV function with an echo

## 2010-11-06 NOTE — Patient Instructions (Signed)
TO BE SCHEDULED IN ~3 WEEKS: Your physician has requested that you have an echocardiogram. Echocardiography is a painless test that uses sound waves to create images of your heart. It provides your doctor with information about the size and shape of your heart and how well your heart's chambers and valves are working. This procedure takes approximately one hour. There are no restrictions for this procedure.  We will call you with your lab results that were drawn today.  Your physician recommends that you schedule a follow-up appointment in: 1 year

## 2010-11-06 NOTE — Assessment & Plan Note (Signed)
Recurrent nonsustained polymorphic VT;  Recheck BMET and Mg

## 2010-11-06 NOTE — Assessment & Plan Note (Signed)
The patient's device was interrogated.  The information was reviewed. No changes were made in the programming.    

## 2010-11-06 NOTE — Assessment & Plan Note (Signed)
As above; will consider adding aldosterone antagonism if Ef stil down

## 2011-02-08 ENCOUNTER — Encounter: Payer: 59 | Admitting: *Deleted

## 2011-02-11 ENCOUNTER — Encounter: Payer: Self-pay | Admitting: *Deleted

## 2011-02-15 ENCOUNTER — Ambulatory Visit (INDEPENDENT_AMBULATORY_CARE_PROVIDER_SITE_OTHER): Payer: 59 | Admitting: *Deleted

## 2011-02-15 ENCOUNTER — Encounter: Payer: Self-pay | Admitting: Internal Medicine

## 2011-02-15 DIAGNOSIS — I429 Cardiomyopathy, unspecified: Secondary | ICD-10-CM

## 2011-02-15 DIAGNOSIS — I472 Ventricular tachycardia: Secondary | ICD-10-CM

## 2011-02-16 LAB — REMOTE ICD DEVICE
BATTERY VOLTAGE: 2.61 V
CHARGE TIME: 10.8 s
DEV-0020ICD: NEGATIVE
RV LEAD IMPEDENCE ICD: 631 Ohm
TZAT-0001FASTVT: 2
TZAT-0013FASTVT: 2
TZAT-0018FASTVT: NEGATIVE
TZST-0001FASTVT: 3
TZST-0001FASTVT: 4
TZST-0003FASTVT: 31 J
TZST-0003FASTVT: 31 J
TZST-0003FASTVT: 31 J
VENTRICULAR PACING ICD: 0 pct

## 2011-02-22 ENCOUNTER — Encounter: Payer: Self-pay | Admitting: *Deleted

## 2011-02-22 NOTE — Progress Notes (Signed)
icd remote check  

## 2011-05-17 ENCOUNTER — Encounter: Payer: 59 | Admitting: *Deleted

## 2011-05-21 ENCOUNTER — Other Ambulatory Visit: Payer: Self-pay | Admitting: Internal Medicine

## 2011-05-21 ENCOUNTER — Encounter: Payer: Self-pay | Admitting: Internal Medicine

## 2011-05-21 ENCOUNTER — Ambulatory Visit (INDEPENDENT_AMBULATORY_CARE_PROVIDER_SITE_OTHER): Payer: 59 | Admitting: *Deleted

## 2011-05-21 DIAGNOSIS — I429 Cardiomyopathy, unspecified: Secondary | ICD-10-CM

## 2011-05-21 DIAGNOSIS — I472 Ventricular tachycardia: Secondary | ICD-10-CM

## 2011-05-23 ENCOUNTER — Encounter: Payer: Self-pay | Admitting: *Deleted

## 2011-05-23 LAB — REMOTE ICD DEVICE
BRDY-0002RV: 40 {beats}/min
DEVICE MODEL ICD: 127217
RV LEAD IMPEDENCE ICD: 614 Ohm
TZAT-0001FASTVT: 1
TZAT-0001FASTVT: 2
TZAT-0013FASTVT: 2
TZAT-0018FASTVT: NEGATIVE
TZAT-0018FASTVT: NEGATIVE
TZST-0001FASTVT: 4
TZST-0003FASTVT: 31 J
TZST-0003FASTVT: 31 J

## 2011-05-24 ENCOUNTER — Other Ambulatory Visit: Payer: Self-pay | Admitting: *Deleted

## 2011-05-24 MED ORDER — CARVEDILOL 25 MG PO TABS: 25.0000 mg | ORAL_TABLET | Freq: Two times a day (BID) | ORAL | Status: DC

## 2011-05-24 NOTE — Progress Notes (Signed)
Remote defib check  

## 2011-08-01 ENCOUNTER — Other Ambulatory Visit: Payer: Self-pay | Admitting: Cardiology

## 2011-08-01 DIAGNOSIS — I429 Cardiomyopathy, unspecified: Secondary | ICD-10-CM

## 2011-08-04 ENCOUNTER — Telehealth: Payer: Self-pay | Admitting: Nurse Practitioner

## 2011-08-04 NOTE — Telephone Encounter (Signed)
Pt called stating that earlier today he had sudden onset of fatigue w/o chest pain, sob, palps, or presyncope.  This was followed by an AICD discharge.  He denies any specific complaints at this time, denying c/p, sob, presyncope, fatigue.  I advised that if he feels poorly or if his device goes off again, then he should come into the ER for eval.  OTW, if he feels well and he has no recurrent device discharges he should call the office on Monday AM to arrange for interrogation.  He verbalized understanding.

## 2011-08-06 ENCOUNTER — Encounter: Payer: Self-pay | Admitting: Internal Medicine

## 2011-08-07 ENCOUNTER — Other Ambulatory Visit (INDEPENDENT_AMBULATORY_CARE_PROVIDER_SITE_OTHER): Payer: 59

## 2011-08-07 ENCOUNTER — Other Ambulatory Visit: Payer: Self-pay

## 2011-08-07 DIAGNOSIS — I429 Cardiomyopathy, unspecified: Secondary | ICD-10-CM

## 2011-08-10 ENCOUNTER — Encounter: Payer: Self-pay | Admitting: *Deleted

## 2011-08-10 NOTE — Progress Notes (Signed)
Notified patient of Echo results per Dr. Graciela Husbands stable Echo.

## 2011-08-14 ENCOUNTER — Encounter: Payer: 59 | Admitting: Internal Medicine

## 2011-08-16 ENCOUNTER — Encounter: Payer: Self-pay | Admitting: Internal Medicine

## 2011-08-16 ENCOUNTER — Ambulatory Visit (INDEPENDENT_AMBULATORY_CARE_PROVIDER_SITE_OTHER): Payer: 59 | Admitting: Internal Medicine

## 2011-08-16 VITALS — BP 182/95 | HR 69 | Resp 18 | Ht 69.0 in | Wt 200.8 lb

## 2011-08-16 DIAGNOSIS — I429 Cardiomyopathy, unspecified: Secondary | ICD-10-CM

## 2011-08-16 DIAGNOSIS — I472 Ventricular tachycardia: Secondary | ICD-10-CM

## 2011-08-16 LAB — ICD DEVICE OBSERVATION
DEV-0020ICD: NEGATIVE
DEVICE MODEL ICD: 127217
TZAT-0001FASTVT: 1
TZAT-0001FASTVT: 2
TZAT-0018FASTVT: NEGATIVE
TZAT-0018FASTVT: NEGATIVE
TZST-0001FASTVT: 6
TZST-0003FASTVT: 31 J
TZST-0003FASTVT: 31 J
TZST-0003FASTVT: 31 J

## 2011-08-16 NOTE — Patient Instructions (Signed)
Your physician recommends that you schedule a follow-up appointment in: keep follow as scheduled with Dr Graciela Husbands  Make sure you take your medications and Do not drive for 6 months

## 2011-08-20 ENCOUNTER — Telehealth: Payer: Self-pay | Admitting: Internal Medicine

## 2011-08-20 NOTE — Telephone Encounter (Signed)
Spoke with pt, aware he is scheduled for recall with klein in June. He will call back to schedule.

## 2011-08-20 NOTE — Telephone Encounter (Signed)
Pt was in 3-28 to see allred because klein was out, was told he would need to see klein but no appt, when does he need to see him? Has remote transmission on 08-23-11

## 2011-08-23 ENCOUNTER — Encounter: Payer: 59 | Admitting: *Deleted

## 2011-08-26 ENCOUNTER — Encounter: Payer: Self-pay | Admitting: Internal Medicine

## 2011-08-26 NOTE — Progress Notes (Signed)
Primary EP:  Dr Graciela Husbands  The patient presents today for urgent electrophysiology followup after a recent ICD shock.  He reports having an ICD shock3/16/13 while playing basketball.  He reports that he had not taken his medication for several days at that point.  He was scheduled to see Dr Graciela Husbands but was noncompliant with that visit.  Today, he is added on as an urgent follow-up.  He is 1 hour late to his visit and will not let my assistants check his blood pressure or interact with him initially. He tells me that he is "anxious when at that doctor's office".  He is having difficulty sleeping chronically.  During our interaction today, he is lying on his left side and does not open his eyes are try to interact with me.  He states that he is "too tired".   Today, he denies symptoms of palpitations, chest pain, shortness of breath, orthopnea, PND, lower extremity edema, dizziness, presyncope, syncope, or further ICD shocks since his episode while playing basketball.  The patient feels that he is tolerating medications without difficulties and is otherwise without complaint today.   Past Medical History  Diagnosis Date  . Secondary cardiomyopathy   . Ventricular septal defect   . Aortic valve replaced     Requiring replacement, specifics not available  . Syncope   . Ventricular tachycardia   . ICD (implantable cardiac defibrillator), single, in situ     S/P implantation  . Anxiety    Past Surgical History  Procedure Date  . Vsd repair   . Aortic valve replacement     Bioprosthesis  . Angioplasty     RCA repair with vein angioplasty following injury with the aforementioned surgery  . Cardiac defibrillator placement     Current Outpatient Prescriptions  Medication Sig Dispense Refill  . carvedilol (COREG) 25 MG tablet Take 1 tablet (25 mg total) by mouth 2 (two) times daily with a meal.  60 tablet  5  . ramipril (ALTACE) 5 MG capsule Take 1 capsule (5 mg total) by mouth daily.  30 capsule  11     Allergies  Allergen Reactions  . Shellfish Allergy     History   Social History  . Marital Status: Married    Spouse Name: N/A    Number of Children: N/A  . Years of Education: N/A   Occupational History  . Full time    Social History Main Topics  . Smoking status: Never Smoker   . Smokeless tobacco: Never Used  . Alcohol Use: No  . Drug Use: No  . Sexually Active: Not on file   Other Topics Concern  . Not on file   Social History Narrative   Married    Family History  Problem Relation Age of Onset  . Hypertension Other     Physical Exam: Filed Vitals:   08/16/11 1146  BP: 182/95  Pulse: 69  Resp: 18  Height: 5\' 9"  (1.753 m)  Weight: 200 lb 12.8 oz (91.082 kg)    GEN- alert and oriented x 3 today, difficult to engage in the exam today and not cooperative for my staff Head- normocephalic, atraumatic Eyes-  Sclera clear, conjunctiva pink Ears- hearing intact Oropharynx- clear Lungs- Clear to ausculation bilaterally, normal work of breathing Chest- ICD pocket is well healed Heart- Regular rate and rhythm, no murmurs, rubs or gallops, PMI not laterally displaced GI- soft, NT, ND, + BS Extremities- no clubbing, cyanosis, or edema MS- no significant deformity or  atrophy Skin- no rash or lesion Psych- anxious mood, bizarre affect Neuro- strength and sensation are intact  ICD interrogation- reviewed in detail today,  See [facility] report  Assessment and Plan:

## 2011-08-26 NOTE — Assessment & Plan Note (Addendum)
Normal ICD function See [facility] report No changes today  ICD interrogation reveals VT on 08/04/11 with a cycle length of for which a 31Jshock briefly terminated tachycardia before it quickly recurred with a CL of .  A second 31J shock was then successful in terminating tachycardia.  This episode occurred while playing basketball in the setting of noncompliance with medicines.  He will follow-up with Dr Graciela Husbands in [place] at the next available time. The importance of compliance with medicines was stressed today.  He states that he will restart coreg and ramipril today.  In addition, I have instructed him to avoid competitive sports including basketball.  He is also aware that after an appropriate ICD shock for his VT that he cannot drive for 6 months.  He expresses willingness to comply with this.

## 2011-08-26 NOTE — Assessment & Plan Note (Signed)
Stable Compliance with medicines is encouraged

## 2011-09-05 ENCOUNTER — Encounter (HOSPITAL_COMMUNITY): Payer: Self-pay | Admitting: *Deleted

## 2011-09-05 ENCOUNTER — Emergency Department (HOSPITAL_COMMUNITY): Payer: 59

## 2011-09-05 ENCOUNTER — Emergency Department (HOSPITAL_COMMUNITY)
Admission: EM | Admit: 2011-09-05 | Discharge: 2011-09-05 | Disposition: A | Discharge status: Home / Self Care | Payer: 59 | Attending: Emergency Medicine | Admitting: Emergency Medicine

## 2011-09-05 DIAGNOSIS — I10 Essential (primary) hypertension: Secondary | ICD-10-CM | POA: Insufficient documentation

## 2011-09-05 DIAGNOSIS — R42 Dizziness and giddiness: Secondary | ICD-10-CM | POA: Insufficient documentation

## 2011-09-05 DIAGNOSIS — R011 Cardiac murmur, unspecified: Secondary | ICD-10-CM | POA: Insufficient documentation

## 2011-09-05 HISTORY — DX: Essential (primary) hypertension: I10

## 2011-09-05 LAB — CBC
HCT: 44.9 % (ref 39.0–52.0)
Hemoglobin: 15.2 g/dL (ref 13.0–17.0)
MCH: 28.6 pg (ref 26.0–34.0)
MCHC: 33.9 g/dL (ref 30.0–36.0)
MCV: 84.4 fL (ref 78.0–100.0)

## 2011-09-05 LAB — DIFFERENTIAL
Basophils Relative: 1 % (ref 0–1)
Eosinophils Absolute: 0.3 10*3/uL (ref 0.0–0.7)
Eosinophils Relative: 4 % (ref 0–5)
Monocytes Absolute: 0.7 10*3/uL (ref 0.1–1.0)
Monocytes Relative: 9 % (ref 3–12)
Neutrophils Relative %: 71 % (ref 43–77)

## 2011-09-05 LAB — BASIC METABOLIC PANEL
BUN: 13 mg/dL (ref 6–23)
Calcium: 9.4 mg/dL (ref 8.4–10.5)
Creatinine, Ser: 1.06 mg/dL (ref 0.50–1.35)
GFR calc Af Amer: >90 mL/min (ref >90)
GFR calc non Af Amer: 88 mL/min — ABNORMAL LOW (ref >90)

## 2011-09-05 MED ORDER — SODIUM CHLORIDE 0.9 % IV BOLUS (SEPSIS): 1000.0000 mL | Freq: Once | INTRAVENOUS | Status: DC

## 2011-09-05 NOTE — ED Notes (Signed)
ecg given to edp Glick/no old ecg found

## 2011-09-05 NOTE — ED Notes (Signed)
Patient presents to ed via [facility], States he was walking at work and started feeling "funny all over his body. ". States he has recently had his verapmil  Increased to 10 mg.  States he just feels weak.

## 2011-09-05 NOTE — ED Provider Notes (Addendum)
36 year old male with implanted defibrillator had an episode today where he got lightheaded and tingly all over. He has had some of these episodes over the last 3 weeks. There was associated dry mouth. He checked his blood pressure noted was elevated. However, he rarely checks his blood pressure and does not know what it had been previously. Episode lasted about 45 minutes and is completely resolved. He does have a history of anxiety. Of note, he has also had a recent cough which he thought was due to the flu. He denied chest pain and dyspnea and nausea and vomiting. Episode sounds most like hyperventilation. On exam, his lungs are clear. He has a 2/6 systolic murmur cardiac base. I suspect is blood pressure elevation is secondary to anxiety related to the hyperventilation symptoms. Chest x-ray will be obtained to rule out pneumonia and CBC and electrolytes will be checked.  Dione Booze, MD 09/05/11 1612   Date: 09/05/2011  Rate: 72  Rhythm: normal sinus rhythm  QRS Axis: normal  Intervals: normal  ST/T Wave abnormalities: nonspecific ST/T changes  Conduction Disutrbances:nonspecific intraventricular conduction delay  Narrative Interpretation: Left ventricular hypertrophy with secondary repolarization changes. No old ECG available for comparison.  Old EKG Reviewed: none available    Dione Booze, MD 09/05/11 [phone]

## 2011-09-05 NOTE — ED Provider Notes (Signed)
History     CSN: [id number]  Arrival date & time 09/05/11  1451   First MD Initiated Contact with Patient 09/05/11 1511      Chief Complaint  Patient presents with  . Dizziness    (Consider location/radiation/quality/duration/timing/severity/associated sxs/prior treatment) Patient is a 36 y.o. male presenting with neurologic complaint. The history is provided by the patient. No language interpreter was used.  Neurologic Problem The primary symptoms include dizziness. Primary symptoms do not include headaches, syncope, loss of consciousness, altered mental status, seizures, visual change, paresthesias, focal weakness, loss of sensation, speech change, memory loss, fever, nausea or vomiting. The symptoms began 1 to 2 hours ago. The symptoms are improving. The neurological symptoms are diffuse. The symptoms occurred on exertion (HTN related).  Dizziness does not occur with tinnitus, nausea, vomiting or weakness.  Additional symptoms do not include neck stiffness, weakness, pain, lower back pain, leg pain, loss of balance, photophobia, hearing loss, tinnitus, vertigo or anxiety. Medical issues also include hypertension. Medical issues do not include alcohol use, drug use, diabetes or recent surgery. Workup history includes cardiac workup.    Past Medical History  Diagnosis Date  . Secondary cardiomyopathy   . Ventricular septal defect   . Aortic valve replaced     Requiring replacement, specifics not available  . Syncope   . Ventricular tachycardia   . ICD (implantable cardiac defibrillator), single, in situ     S/P implantation  . Anxiety   . Hypertension     Past Surgical History  Procedure Date  . Vsd repair   . Aortic valve replacement     Bioprosthesis  . Angioplasty     RCA repair with vein angioplasty following injury with the aforementioned surgery  . Cardiac defibrillator placement     Family History  Problem Relation Age of Onset  . Hypertension Other      History  Substance Use Topics  . Smoking status: Never Smoker   . Smokeless tobacco: Never Used  . Alcohol Use: No      Review of Systems  Constitutional: Negative for fever and chills.  HENT: Positive for congestion and rhinorrhea. Negative for hearing loss, ear pain, sore throat, trouble swallowing, neck pain, neck stiffness, sinus pressure and tinnitus.   Eyes: Negative for photophobia and visual disturbance.  Respiratory: Negative for cough, chest tightness and shortness of breath.   Cardiovascular: Negative for chest pain, palpitations, leg swelling and syncope.  Gastrointestinal: Negative for nausea, vomiting, abdominal pain, diarrhea, constipation, blood in stool and abdominal distention.  Genitourinary: Negative for dysuria, urgency, hematuria and difficulty urinating.  Musculoskeletal: Negative for back pain and gait problem.  Skin: Negative for rash.  Neurological: Positive for dizziness. Negative for vertigo, tremors, speech change, focal weakness, seizures, loss of consciousness, syncope, facial asymmetry, speech difficulty, weakness, light-headedness, numbness, headaches, paresthesias and loss of balance.  Hematological: Negative for adenopathy. Does not bruise/bleed easily.  Psychiatric/Behavioral: Negative for memory loss, confusion and altered mental status.    Allergies  Shellfish allergy  Home Medications   Current Outpatient Rx  Name Route Sig Dispense Refill  . CARVEDILOL 25 MG PO TABS Oral Take 25 mg by mouth 2 (two) times daily with a meal.    . OMEPRAZOLE 10 MG PO CPDR Oral Take 10 mg by mouth 2 (two) times daily.    Marland Kitchen RAMIPRIL 10 MG PO CAPS Oral Take 10 mg by mouth daily.      BP 153/98  Pulse 76  Temp(Src) 97.2 F (36.2  C) (Oral)  Resp 18  SpO2 100%  Physical Exam  Constitutional: He is oriented to person, place, and time. He appears well-developed and well-nourished. No distress.  HENT:  Head: Normocephalic.  Eyes: Conjunctivae are  normal.  Neck: Normal range of motion. Neck supple.  Cardiovascular: Normal rate, regular rhythm and intact distal pulses.   Murmur heard.  Systolic murmur is present with a grade of 2/6       Loudest over aortic post.   Pulmonary/Chest: Effort normal and breath sounds normal. No respiratory distress. He has no wheezes. He has no rales. He exhibits no tenderness.  Abdominal: Soft. Bowel sounds are normal. He exhibits no distension and no mass. There is no tenderness. There is no rebound and no guarding.  Musculoskeletal: Normal range of motion. He exhibits no edema and no tenderness.  Neurological: He is alert and oriented to person, place, and time. He has normal strength. No cranial nerve deficit or sensory deficit. Coordination and gait normal. GCS eye subscore is 4. GCS verbal subscore is 5. GCS motor subscore is 6.  Skin: Skin is warm and dry. He is not diaphoretic.  Psychiatric: He has a normal mood and affect.    ED Course  Procedures (including critical care time)  Labs Reviewed  BASIC METABOLIC PANEL - Abnormal; Notable for the following:    Glucose, Bld 107 (*)    GFR calc non Af Amer 88 (*)    All other components within normal limits  CBC  DIFFERENTIAL   Dg Chest 2 View  09/05/2011  *RADIOLOGY REPORT*  Clinical Data: Dizziness  CHEST - 2 VIEW  Comparison: Chest x-ray of 07/05/2006  Findings: The lungs are clear.  Mild cardiomegaly is stable.  A permanent pacemaker with AICD lead remains.  No bony abnormality is seen.  IMPRESSION: Stable mild cardiomegaly with permanent pacemaker.  No active lung disease.  Original Report Authenticated By: Juline Patch, M.D.    1. Dizzy   2. Hypertension       MDM  Pt is a well appearing 35yo AAM with PMH of VSD, AVR, and VT s/p ICD who presents with 2 hours of dizziness and generalized tingling after some mild exertion at work today. BP reported 170/100 by EMS which pt admitted to me that he has had dizzy spells with tingling  identical to todays that have correlated with HTN episodes. BP 153/98 now and pt reports sx much better. Recent URI sx but no CP, SOB, palpitations or ICD discharges recently. Glucose 100 by EMS. Pt denies dark stools or rectal bleeding. No syncope and no hx of seizures. Pt has been complaint with his HTN meds. No other new meds. No drug use. No energy drinks or stimulant use. Pt later admitted to me that he missed lunch today. Sx today possibly 2/2 dizziness from HTN which is resolving as well as hypoglycemia or orthostasis. Clinical picture doubtful for arrhythmia, PE, ACS, dissection, SAH, seizure, anemia, electrolyte abnormality or stroke. No focal neuro deficits on exam. Pt does have a murmur on exam not noted on last 2 cardiology clinic appointments. Labs and CXR pending.   All testing unremarkable. Pt states sx resolved on reassessment. Given new murmur cardiology was consulted who plans to evaluate pt in clinic tomorrow or Friday. D/C home in stable condition with sx resolved.         Consuello Masse, MD 09/06/11 [phone]

## 2011-09-05 NOTE — Discharge Instructions (Signed)
Dizziness Dizziness is a common problem. It is a feeling of unsteadiness or lightheadedness. You may feel like you are about to faint. Dizziness can lead to injury if you stumble or fall. A person of any age group can suffer from dizziness, but dizziness is more common in older adults. CAUSES  Dizziness can be caused by many different things, including:  Middle ear problems.   Standing for too long.   Infections.   An allergic reaction.   Aging.   An emotional response to something, such as the sight of blood.   Side effects of medicines.   Fatigue.   Problems with circulation or blood pressure.   Excess use of alcohol, medicines, or illegal drug use.   Breathing too fast (hyperventilation).   An arrhythmia or problems with your heart rhythm.   Low red blood cell count (anemia).   Pregnancy.   Vomiting, diarrhea, fever, or other illnesses that cause dehydration.   Diseases or conditions such as Parkinson's disease, high blood pressure (hypertension), diabetes, and thyroid problems.   Exposure to extreme heat.  DIAGNOSIS  To find the cause of your dizziness, your caregiver may do a physical exam, lab tests, radiologic imaging scans, or an electrocardiography test (ECG).  TREATMENT  Treatment of dizziness depends on the cause of your symptoms and can vary greatly. HOME CARE INSTRUCTIONS   Drink enough fluids to keep your urine clear or pale yellow. This is especially important in very hot weather. In the elderly, it is also important in cold weather.   If your dizziness is caused by medicines, take them exactly as directed. When taking blood pressure medicines, it is especially important to get up slowly.   Rise slowly from chairs and steady yourself until you feel okay.   In the morning, first sit up on the side of the bed. When this seems okay, stand slowly while holding onto something until you know your balance is fine.   If you need to stand in one place for a  long time, be sure to move your legs often. Tighten and relax the muscles in your legs while standing.   If dizziness continues to be a problem, have someone stay with you for a day or two. Do this until you feel you are well enough to stay alone. Have the person call your caregiver if he or she notices changes in you that are concerning.   Do not drive or use heavy machinery if you feel dizzy.  SEEK IMMEDIATE MEDICAL CARE IF:   Your dizziness or lightheadedness gets worse.   You feel nauseous or vomit.   You develop problems with talking, walking, weakness, or using your arms, hands, or legs.   You are not thinking clearly or you have difficulty forming sentences. It may take a friend or family member to determine if your thinking is normal.   You develop chest pain, abdominal pain, shortness of breath, or sweating.   Your vision changes.   You notice any bleeding.   You have side effects from medicine that seems to be getting worse rather than better.  MAKE SURE YOU:   Understand these instructions.   Will watch your condition.   Will get help right away if you are not doing well or get worse.  Document Released: 10/31/2000 Document Revised: 04/26/2011 Document Reviewed: 11/24/2010 [facility] Patient Information 2012 [place], [place].Heart Murmur A heart murmur is an extra sound heard by your caregiver when listening to your heart with a device  called a stethoscope. The sound might be a "hum" or "whoosh" sound heard when the heart beats. The sound comes from turbulence when blood flows through the heart. There are two types of heart murmurs:  Innocent (Harmless) murmurs: Most people with this type of heart murmur do not have signs or symptoms of heart problems. Many children have innocent heart murmurs. When an innocent heart murmur is found, there is no need to get tests or do treatment. Also, there is no need to restrict activities or stop playing sports. Innocent heart murmurs may  be caused by many things. For example, it might be caused by a tiny hole or defect in the wall of the heart. These defects often close as a child grows. An innocent heart murmur may be heard by an examining clinician throughout your life. If you see a new caregiver, please let him or her know this was found during past exams.   Abnormal murmurs: May have signs and symptoms of heart problems. These types of murmurs can occur in children and adults. In children, abnormal heart murmurs are typically caused from heart defects that are present at birth. In adults, abnormal murmurs are usually from heart valve problems caused by disease, infection, or aging.  SYMPTOMS   Innocent (Harmless) murmurs do not cause symptoms or require you to limit physical activity.   Many people with abnormal murmurs may or may not have symptoms. If symptoms do develop, they might include:   Shortness of breath.   Blue coloring of the skin, especially on the fingertips.   Chest pain.   Palpitations or feeling a "fluttering" or a "skipped" heart beat.   Fainting.   Persistent cough.   Getting tired much faster than expected.  DIAGNOSIS  A heart murmur might be heard during a pre-sports physical or during any type of examination. When a murmur is heard, it may suggest a possible problem. When this happens, your caregiver may ask you to see a heart specialist (cardiologist). You may also be asked to undergo one or more heart tests. In these cases, testing may vary depending upon what your caregiver heard. Tests for a heart murmur might include one or more of the following:  EKG (electrocardiogram).   Echocardiogram.   Cardiac MRI.  For children and adults who have an abnormal heart murmur and want to play sports, it is important to complete testing, review test results, and receive recommendations from your caregiver. If heart disease is present, it may be risky to play. Finding out the results of your test Not  all test results are available during your visit. If your test results are not back during the visit, make an appointment with your caregiver to find out the results. Do not assume everything is normal if you have not heard from your caregiver or the medical facility. It is important for you to follow up on all of your test results.  TREATMENT  As noted above, innocent (harmless) murmurs require no treatment or activity restriction. If the murmur represents a problem with the heart, treatment will depend upon the exact nature of the problem. In these cases, medicine or surgery may be needed to treat the problem. HOME CARE INSTRUCTIONS If you want to participate in sports or other types of strenuous physical activity, it is important to discuss this first with your caregiver. If the murmur represents a problem with the heart and you choose to participate in sports, there is a small chance that a serious problem (  including sudden death) could result.  SEEK MEDICAL CARE IF:   You feel that your symptoms are slowly worsening.   You develop any new symptoms that cause concern.   You feel that you are having side effects from any medicines prescribed.  SEEK IMMEDIATE MEDICAL CARE IF:   Chest pain develops.   You are short of breath.   You notice that your heart beats irregularly often enough to cause you to worry.   You have fainting spells.   There is a worsening of any problems that brought you or your child in for medical care.  Document Released: 06/14/2004 Document Revised: 04/26/2011 Document Reviewed: 07/15/2007 [facility] Patient Information 2012 [place], [place].Hypertension As your heart beats, it forces blood through your arteries. This force is your blood pressure. If the pressure is too high, it is called hypertension (HTN) or high blood pressure. HTN is dangerous because you may have it and not know it. High blood pressure may mean that your heart has to work harder to pump blood. Your  arteries may be narrow or stiff. The extra work puts you at risk for heart disease, stroke, and other problems.  Blood pressure consists of two numbers, a higher number over a lower, 110/72, for example. It is stated as "110 over 72." The ideal is below 120 for the top number (systolic) and under 80 for the bottom (diastolic). Write down your blood pressure today. You should pay close attention to your blood pressure if you have certain conditions such as:  Heart failure.   Prior heart attack.   Diabetes   Chronic kidney disease.   Prior stroke.   Multiple risk factors for heart disease.  To see if you have HTN, your blood pressure should be measured while you are seated with your arm held at the level of the heart. It should be measured at least twice. A one-time elevated blood pressure reading (especially in the Emergency Department) does not mean that you need treatment. There may be conditions in which the blood pressure is different between your right and left arms. It is important to see your caregiver soon for a recheck. Most people have essential hypertension which means that there is not a specific cause. This type of high blood pressure may be lowered by changing lifestyle factors such as:  Stress.   Smoking.   Lack of exercise.   Excessive weight.   Drug/tobacco/alcohol use.   Eating less salt.  Most people do not have symptoms from high blood pressure until it has caused damage to the body. Effective treatment can often prevent, delay or reduce that damage. TREATMENT  When a cause has been identified, treatment for high blood pressure is directed at the cause. There are a large number of medications to treat HTN. These fall into several categories, and your caregiver will help you select the medicines that are best for you. Medications may have side effects. You should review side effects with your caregiver. If your blood pressure stays high after you have made lifestyle  changes or started on medicines,   Your medication(s) may need to be changed.   Other problems may need to be addressed.   Be certain you understand your prescriptions, and know how and when to take your medicine.   Be sure to follow up with your caregiver within the time frame advised (usually within two weeks) to have your blood pressure rechecked and to review your medications.   If you are taking more than  one medicine to lower your blood pressure, make sure you know how and at what times they should be taken. Taking two medicines at the same time can result in blood pressure that is too low.  SEEK IMMEDIATE MEDICAL CARE IF:  You develop a severe headache, blurred or changing vision, or confusion.   You have unusual weakness or numbness, or a faint feeling.   You have severe chest or abdominal pain, vomiting, or breathing problems.  MAKE SURE YOU:   Understand these instructions.   Will watch your condition.   Will get help right away if you are not doing well or get worse.  Document Released: 05/07/2005 Document Revised: 04/26/2011 Document Reviewed: 12/26/2007 [facility] Patient Information 2012 [place], [place].

## 2011-09-06 ENCOUNTER — Encounter: Payer: 59 | Admitting: Internal Medicine

## 2011-09-07 NOTE — ED Provider Notes (Signed)
I saw and evaluated the patient, reviewed the resident's note and I agree with the findings and plan.   Dione Booze, MD 09/07/11 [phone]

## 2011-09-10 ENCOUNTER — Ambulatory Visit (INDEPENDENT_AMBULATORY_CARE_PROVIDER_SITE_OTHER): Payer: 59 | Admitting: Physician Assistant

## 2011-09-10 ENCOUNTER — Encounter: Payer: Self-pay | Admitting: Physician Assistant

## 2011-09-10 VITALS — BP 118/82 | HR 98 | Ht 69.0 in | Wt 194.8 lb

## 2011-09-10 DIAGNOSIS — I472 Ventricular tachycardia, unspecified: Secondary | ICD-10-CM

## 2011-09-10 DIAGNOSIS — I10 Essential (primary) hypertension: Secondary | ICD-10-CM

## 2011-09-10 DIAGNOSIS — A09 Infectious gastroenteritis and colitis, unspecified: Secondary | ICD-10-CM

## 2011-09-10 DIAGNOSIS — I429 Cardiomyopathy, unspecified: Secondary | ICD-10-CM

## 2011-09-10 DIAGNOSIS — A084 Viral intestinal infection, unspecified: Secondary | ICD-10-CM

## 2011-09-10 NOTE — Progress Notes (Signed)
[address] [place], [place]  [address] Phone: [phone] Fax:  [phone]  Date:  09/10/2011   Name:  Fernando COMMISSO       DOB:  [birth year] MRN:  [id number]  PCP:  No primary provider on file.  Primary Cardiologist:  Dr. Sherryl Manges  Primary Electrophysiologist:  Dr. Sherryl Manges    History of Present Illness: [name] is a 36 y.o. male who presents for follow up.  He has a history of VSD, status post repair at the age of 16, status post pericardial tissue aortic valve replacement in 2002 secondary to aortic insufficiency.  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  This was performed at another facility.  He did require a vein patch angioplasty of the RCA secondary to damage incurred during the aortic valve surgery.  He has a history of nonischemic cardiomyopathy with ejection fraction as low as 30% in the past.  He is status post AICD secondary to ventricular tachycardia.  He had a heart catheterization in 06/2006 at another facility in the setting of ventricular tachycardia.  This was reportedly normal.  However the RCA could not be engaged.  His procedure was complicated by a large retroperitoneal hematoma with extension into the scrotum.  AICD was implanted 06/2006.    He was seen in this office 08/26/11 after an ICD shock on 08/04/11 while playing basketball.  He was seen by Dr. Johney Frame.  He was off of his coreg and ramipril and these were restarted as his BP was high.  He was also instructed to avoid competitive sports as well as to refrain from driving for 6 months post ICD shock.  He was to followup with Dr. Graciela Husbands at the next available visit in [place].    He was evaluated in the emergency room 09/05/11 with dizziness.  There was no clear reason for his dizziness according to the notes.  CBC and basic metabolic panel were within normal limits.  Chest x-ray was nonacute.    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  He notes his BP goes up after eating.  He admits to a high salt diet.  He would like to see a nutritionist.  No further dizziness.  No chest pain.  No dyspnea.  He has limited his activity.  No orthopnea, PND or edema.  No syncope.  No ICD shocks.    Past Medical History  Diagnosis Date  . Secondary cardiomyopathy   . Ventricular septal defect   . Aortic valve replaced     Requiring replacement, specifics not available  . Syncope   . Ventricular tachycardia   . ICD (implantable cardiac defibrillator), single, in situ     S/P implantation  . Anxiety   . Hypertension     Current Outpatient Prescriptions  Medication Sig Dispense Refill  . carvedilol (COREG) 25 MG tablet Take 25 mg by mouth 2 (two) times daily with a meal.      . omeprazole (PRILOSEC) 10 MG capsule Take 10 mg by mouth 2 (two) times daily.      . ramipril (ALTACE) 10 MG capsule Take 10 mg by mouth daily.        Allergies: Allergies  Allergen Reactions  . Shellfish Allergy Nausea And Vomiting    History  Substance Use Topics  .  Smoking status: Never Smoker   . Smokeless tobacco: Never Used  . Alcohol Use: No     ROS:  Please see the history of present illness.   Notes diarrhea and vomiting starting last night.  No cough.  No bloody stools.   All other systems reviewed and negative.   PHYSICAL EXAM: VS:  BP 118/82  Pulse 98  Ht 5\' 9"  (1.753 m)  Wt 194 lb 12.8 oz (88.361 kg)  BMI 28.77 kg/m2 Well nourished, well developed, in no acute distress HEENT: normal Neck: no JVD Cardiac:  normal S1, accentuated S2; RRR; 1/6 systolic murmur along LSB Lungs:  clear to auscultation bilaterally, no wheezing, rhonchi or rales Abd: normal bowel sounds, soft, nontender, no hepatomegaly Ext: no edema Skin: warm and dry Neuro:  CNs 2-12 intact, no focal abnormalities  noted  EKG:  Sinus rhythm, HR 98, normal axis, unusual P wave (? RAE), LVH with repol abnormality, no significant change from prior tracing  Device Interrogation:  No episodes since 08/20/11.  Episode on 4/1 was non sustained.    ASSESSMENT AND PLAN:  1. Essential hypertension, benign  Controlled.  Sounds like his diet is poor.  Will give him low Na diet.  He would like to see a nutritionist.  Will make that referral.   2. Paroxysmal ventricular tachycardia  Arrange follow up with Dr. Sherryl Manges.    3. CARDIOMYOPATHY, SECONDARY  EF stable.  Continue ACE and beta blocker.  Follow up with Dr. Sherryl Manges in 1-2 mos.   4. Viral gastroenteritis  Push fluids.  Advance to [employer]  Follow up with PCP if no improvement over 72 hours.   5.  Dizziness      No episodes on ICD to explain his symptoms.  Dizziness seems to be related to elevated BP.  Work       On diet as noted.  Continue current Rx.     Luna Glasgow, PA-C  11:28 AM 09/10/2011

## 2011-09-10 NOTE — Patient Instructions (Signed)
Your physician recommends that you schedule a follow-up appointment in: 1-2 months with Dr Graciela Husbands in [facility] have been referred to Nutrition at [facility] for Hypertensive diet      3 to 4 Gram Sodium Diet, No Added Salt (NAS) A 3 to 4 gram sodium diet restricts the amount of sodium in the diet to no more than 3 to 4 g or 3000 to 4000 mg daily. Limiting the amount of sodium is often used to help lower blood pressure. It is important if you have heart, liver, or kidney problems. Many foods contain sodium for flavor and sometimes as a preservative. When the amount of sodium in a diet needs to be low, it is important to know what to look for when choosing foods and drinks. The following includes some information and guidelines to help make it easier for you to adapt to a low sodium diet. QUICK TIPS  Do not add salt to food.   Avoid convenience items and fast food.   Choose unsalted snack foods.   Buy lower sodium products, often labeled as "lower sodium" or "no salt added."   Check food labels to learn how much sodium is in 1 serving.   When eating at a restaurant, ask that your food be prepared with less salt or none, if possible.  READING FOOD LABELS FOR SODIUM INFORMATION The nutrition facts label is a good place to find how much sodium is in foods. Look for products with no more than 500 to 600 mg of sodium per meal and no more than 150 mg per serving. Remember that 3 to 4 g = 3000 to 4000 mg. The food label may also list foods as:  Sodium-free: Less than 5 mg in a serving.   Very low sodium: 35 mg or less in a serving.   Low-sodium: 140 mg or less in a serving.   Light in sodium: 50% less sodium in a serving. For example, if a food that usually has 300 mg of sodium is changed to become light in sodium, it will have 150 mg of sodium.   Reduced sodium: 25% less sodium in a serving. For example, if a food that usually has 400 mg of sodium is changed to reduced sodium, it will have  300 mg of sodium.  CHOOSING FOODS Grains  Avoid: Salted crackers and snack items. Bread stuffing and biscuit mixes. Seasoned rice or pasta mixes.   Choose: Unsalted snack items. English muffins, breads, and rolls. Homemade pancakes and waffles. Most cereals. Pasta.  Meats  Avoid: Salted, canned, smoked, spiced, pickled meats, including fish and poultry. Bacon, ham, sausage, cold cuts, hot dogs, anchovies.   Choose: Low-sodium canned tuna and salmon. Fresh or frozen meat, poultry, and fish.  Dairy  Avoid: Processed cheese and spreads. Cottage cheese. Buttermilk and condensed milk. Regular cheese.   Choose: Milk. Low-sodium cottage cheese. Yogurt. Sour cream. Low-sodium cheese.  Fruits and Vegetables  Avoid: Regular canned vegetables. Regular canned tomato sauce and paste. Frozen vegetables in sauces. Olives. Rosita Fire. Relishes. Sauerkraut.   Choose: Low-sodium canned vegetables. Low-sodium tomato sauce and paste. Frozen or fresh vegetables. Fresh and frozen fruit.  Condiments  Avoid: Canned and packaged gravies. Worcestershire sauce. Tartar sauce. Barbecue sauce. Soy sauce. Steak sauce. Ketchup. Onion, garlic, and table salt. Meat flavorings and tenderizers.   Choose: Fresh and dried herbs and spices. Low-sodium varieties of mustard and ketchup. Lemon juice. Tabasco sauce. Horseradish.  SAMPLE 3 TO 4 GRAM SODIUM MEAL PLAN  Breakfast /  Sodium (mg)  1 cup low-fat milk / 143 mg   2 slices whole-wheat toast / 270 mg   1 tbs heart-healthy margarine / 153 mg   1 hard-boiled egg / 139 mg  Lunch / Sodium (mg)  1 cup raw carrots / 76 mg    cup hummus / 298 mg   1 cup low-fat milk / 143 mg    cup red grapes / 2 mg   1 cup low-sodium chicken and rice soup / 480 mg   10 low-sodium saltine crackers / 191 mg  Dinner / Sodium (mg)  1 cup whole-wheat pasta / 2 mg   1 cup tomato sauce / 1178 mg   3 oz lean ground beef / 57 mg   1 small side salad (1 cup raw spinach leaves,   cup cucumber,  cup yellow bell pepper) / 25 mg   1 tsp ranch dressing / 144 mg  Snack / Sodium (mg)  1 slice cheddar cheese / 258 mg   1 medium apple / 1 mg  Nutrient Analysis  Calories: 2005   Protein: 85 g   Carbohydrate: 245 g   Fat: 78 g   Sodium: 3560 mg  Document Released: 05/07/2005 Document Revised: 04/26/2011 Document Reviewed: 08/08/2009 [facility] Patient Information 2012 [place], [place].

## 2011-09-12 ENCOUNTER — Ambulatory Visit: Payer: 59 | Admitting: Physician Assistant

## 2011-09-28 ENCOUNTER — Telehealth: Payer: Self-pay | Admitting: Internal Medicine

## 2011-09-28 ENCOUNTER — Telehealth: Payer: Self-pay | Admitting: Physician Assistant

## 2011-09-28 NOTE — Telephone Encounter (Signed)
Patient called asking what he can take OTC for a chest cold. He has a runny/stuffy nose, post-nasal drip & chest congestion. He thinks he may have had a fever but isn't sure; denies any body aches or shaking chills. I see we called him earlier on his answering machine recommending Coricidin and Claritin. He told me he just took his first dose of Coricidin HPB earlier today but wasn't sure it helped. I told him to continue it scheduled for now, and add Claritin along with saline nasal spray. He inquired about Mucinex, but his Coricidin HBP already has guaifenesin in it. I instructed him to avoid pseudophedrine products as well as Afrin given stimulant effects (he had a product called Zutripro that had pseudoephedrine in it and was instructed to avoid it). I also talked to him about trying hot steam treatments, proper handwashing, and getting rest. I told him to see his PCP or urgent care in the next 1-2 days if his cold does not get any better to determine if further therapy is warranted. He verbalized understanding and gratitude.  Marcea Rojek PA-C

## 2011-09-28 NOTE — Telephone Encounter (Signed)
New msg Pt has a head cold.allergies and he wants to know what he can take. Please call

## 2011-09-28 NOTE — Telephone Encounter (Signed)
I left a message on the patient's phone to try plain claritin or coricidin for his symptoms.

## 2011-10-04 ENCOUNTER — Other Ambulatory Visit: Payer: Self-pay | Admitting: Cardiology

## 2011-10-04 ENCOUNTER — Other Ambulatory Visit: Payer: Self-pay | Admitting: *Deleted

## 2011-10-04 MED ORDER — RAMIPRIL 5 MG PO CAPS: 10.0000 mg | ORAL_CAPSULE | Freq: Every day | ORAL | Status: DC

## 2011-11-02 ENCOUNTER — Encounter: Payer: Self-pay | Admitting: *Deleted

## 2011-11-13 ENCOUNTER — Encounter: Payer: 59 | Admitting: Internal Medicine

## 2011-11-14 ENCOUNTER — Telehealth: Payer: Self-pay

## 2011-11-14 NOTE — Telephone Encounter (Signed)
Scheduled pt with Dr. Graciela Husbands in [facility] for 12/13/11 at 0845. Pt informed.  Understanding verb.

## 2011-11-14 NOTE — Telephone Encounter (Signed)
Message copied by Marcelle Overlie on Wed Nov 14, 2011  2:01 PM ------      Message from: Thersa Salt      Created: Wed Nov 14, 2011  1:20 PM      Regarding: RE: klein appt       I talked to him and confirmed the appt??? He must have been calling wrong number.       No where to put him here. Will need to call [facility] for their schedule. Talk to Marlowe Kays about Graciela Husbands schedule there. SORRY.      ----- Message -----         From: Marcelle Overlie, RN         Sent: 11/14/2011  12:39 PM           To: Angelina Sheriff Little      Subject: klein appt                                               Pt missed appt with Dr. Graciela Husbands yesterday. He says he tried to call multiple times but line was busy            He needs to r/s. I offered him first available 8/20, but he says he needs to be seen sooner. Says he can go to [facility] if needed.  Can you help me find a place to put him ([facility] or [facility])?      Thanks

## 2011-12-13 ENCOUNTER — Encounter: Payer: Self-pay | Admitting: Internal Medicine

## 2011-12-13 ENCOUNTER — Ambulatory Visit (INDEPENDENT_AMBULATORY_CARE_PROVIDER_SITE_OTHER): Payer: 59 | Admitting: Internal Medicine

## 2011-12-13 VITALS — BP 104/66 | HR 72 | Ht 70.0 in | Wt 196.8 lb

## 2011-12-13 DIAGNOSIS — Z95810 Presence of automatic (implantable) cardiac defibrillator: Secondary | ICD-10-CM

## 2011-12-13 DIAGNOSIS — I429 Cardiomyopathy, unspecified: Secondary | ICD-10-CM

## 2011-12-13 DIAGNOSIS — I10 Essential (primary) hypertension: Secondary | ICD-10-CM

## 2011-12-13 DIAGNOSIS — I472 Ventricular tachycardia: Secondary | ICD-10-CM

## 2011-12-13 LAB — ICD DEVICE OBSERVATION
BATTERY VOLTAGE: 2.58 V
CHARGE TIME: 10.8 s
DEV-0020ICD: NEGATIVE
HV IMPEDENCE: 44 Ohm
TZAT-0001FASTVT: 1
TZAT-0001FASTVT: 2
TZAT-0002FASTVT: NEGATIVE
TZAT-0013FASTVT: 2
TZAT-0018FASTVT: NEGATIVE
TZON-0003FASTVT: 300 ms
TZST-0001FASTVT: 4
TZST-0001FASTVT: 5
TZST-0003FASTVT: 31 J
TZST-0003FASTVT: 31 J
TZST-0003FASTVT: 31 J

## 2011-12-13 NOTE — Assessment & Plan Note (Signed)
No recurrent therapy. There was an episode of nonsustained ventricular tachycardia. He is currently taking his beta blockers and ACE inhibitors.

## 2011-12-13 NOTE — Assessment & Plan Note (Signed)
The patient's device was interrogated.  The information was reviewed. No changes were made in the programming.    

## 2011-12-13 NOTE — Assessment & Plan Note (Signed)
Continue on his current medications.

## 2011-12-13 NOTE — Patient Instructions (Addendum)
Your physician recommends that you schedule a follow-up appointment in: 4-5 months with Dr. Graciela Husbands in [facility]  Your physician recommends that you continue on your current medications as directed. Please refer to the Current Medication list given to you today.

## 2011-12-13 NOTE — Assessment & Plan Note (Signed)
Stable on current medications 

## 2011-12-13 NOTE — Progress Notes (Signed)
  HPI  [name] is a 36 y.o. male Seen in followup for ventricular tachycardia occurring in the context of surgically corrected congenital heart disease.Marland Kitchen He is status post ICD implantation in April had appropriate shock for fast ventricular tachycardia  He is taking his medications as prescribed. He is feeling pretty good.I  Echo March 2013 demonstrated left ventricular dysfunction with an ejection fraction of 40-45% with inferior posterior akinesis and aortic valve bioprosthesis  Past Medical History  Diagnosis Date  . Secondary cardiomyopathy   . Ventricular septal defect   . Aortic valve replaced     Requiring replacement, specifics not available  . Syncope   . Ventricular tachycardia     appropriate VT shock therapy  . ICD (implantable cardiac defibrillator), BSX single     S/P implantation  . Anxiety   . Hypertension     Past Surgical History  Procedure Date  . Vsd repair   . Aortic valve replacement     Bioprosthesis  . Angioplasty     RCA repair with vein angioplasty following injury with the aforementioned surgery  . Cardiac defibrillator placement     Current Outpatient Prescriptions  Medication Sig Dispense Refill  . carvedilol (COREG) 25 MG tablet Take 25 mg by mouth 2 (two) times daily with a meal.      . omeprazole (PRILOSEC) 10 MG capsule Take 10 mg by mouth 2 (two) times daily.      . ramipril (ALTACE) 5 MG capsule Take 2 capsules (10 mg total) by mouth daily.  60 capsule  9    Allergies  Allergen Reactions  . Shellfish Allergy Nausea And Vomiting    Review of Systems negative except from HPI and PMH  Physical Exam BP 104/66  Pulse 72  Ht 5\' 10"  (1.778 m)  Wt 196 lb 12.8 oz (89.268 kg)  BMI 28.24 kg/m2 Well developed and well nourished in no acute distress HENT normal E scleral and icterus clear Neck Supple JVP flat; carotids brisk and full Clear to ausculation Regular rate and rhythm, no murmurs gallops or rub Soft with active bowel  sounds No clubbing cyanosis none as there is Johnsen Edema Alert and oriented, grossly normal motor and sensory function Skin Warm and Dry    Assessment and  Plan

## 2012-01-15 ENCOUNTER — Other Ambulatory Visit: Payer: Self-pay | Admitting: Internal Medicine

## 2012-01-15 MED ORDER — CARVEDILOL 25 MG PO TABS: 25.0000 mg | ORAL_TABLET | Freq: Two times a day (BID) | ORAL | Status: DC

## 2012-04-15 ENCOUNTER — Encounter: Payer: Self-pay | Admitting: Internal Medicine

## 2012-04-15 ENCOUNTER — Ambulatory Visit (INDEPENDENT_AMBULATORY_CARE_PROVIDER_SITE_OTHER): Payer: 59 | Admitting: Internal Medicine

## 2012-04-15 VITALS — BP 118/80 | HR 76 | Ht 69.0 in | Wt 200.5 lb

## 2012-04-15 DIAGNOSIS — Z954 Presence of other heart-valve replacement: Secondary | ICD-10-CM

## 2012-04-15 DIAGNOSIS — I472 Ventricular tachycardia: Secondary | ICD-10-CM

## 2012-04-15 DIAGNOSIS — I10 Essential (primary) hypertension: Secondary | ICD-10-CM

## 2012-04-15 DIAGNOSIS — Z95810 Presence of automatic (implantable) cardiac defibrillator: Secondary | ICD-10-CM

## 2012-04-15 DIAGNOSIS — I429 Cardiomyopathy, unspecified: Secondary | ICD-10-CM

## 2012-04-15 LAB — ICD DEVICE OBSERVATION
BRDY-0002RV: 40 {beats}/min
DEV-0020ICD: NEGATIVE
DEVICE MODEL ICD: 127217
HV IMPEDENCE: 45 Ohm
RV LEAD AMPLITUDE: 15.3 mv
RV LEAD THRESHOLD: 0.8 V
TZAT-0001FASTVT: 2
TZAT-0002FASTVT: NEGATIVE
TZAT-0013FASTVT: 2
TZAT-0018FASTVT: NEGATIVE
TZON-0003FASTVT: 300 ms
TZST-0001FASTVT: 4
TZST-0001FASTVT: 5
TZST-0001FASTVT: 6
TZST-0001FASTVT: 7
TZST-0003FASTVT: 31 J
TZST-0003FASTVT: 31 J
TZST-0003FASTVT: 31 J

## 2012-04-15 NOTE — Assessment & Plan Note (Signed)
Well controlled with current meds.

## 2012-04-15 NOTE — Assessment & Plan Note (Signed)
Continue current beta blockers and ACE inhibitors

## 2012-04-15 NOTE — Progress Notes (Signed)
  HPI  [name] is a 36 y.o. male Seen in followup for ventricular tachycardia occurring in the context of surgically corrected congenital heart disease.Marland Kitchen He is status post ICD implantation.  4/13 had appropriate shock for fast ventricular tachycardia  He is taking his medications as prescribed. He is feeling pretty good.I  Echo March 2013 demonstrated left ventricular dysfunction with an ejection fraction of 40-45% with inferior posterior akinesis and aortic valve bioprosthesis. 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  Past Medical History  Diagnosis Date  . Secondary cardiomyopathy   . Ventricular septal defect   . Aortic valve replaced     Requiring replacement, specifics not available  . Syncope   . Ventricular tachycardia     appropriate VT shock therapy  . ICD (implantable cardiac defibrillator), BSX single     S/P implantation  . Anxiety   . Hypertension     Past Surgical History  Procedure Date  . Vsd repair   . Aortic valve replacement     Bioprosthesis  . Angioplasty     RCA repair with vein angioplasty following injury with the aforementioned surgery  . Cardiac defibrillator placement     Current Outpatient Prescriptions  Medication Sig Dispense Refill  . carvedilol (COREG) 25 MG tablet Take 1 tablet (25 mg total) by mouth 2 (two) times daily with a meal.  60 tablet  5  . omeprazole (PRILOSEC) 10 MG capsule Take 10 mg by mouth 2 (two) times daily.      . ramipril (ALTACE) 5 MG capsule Take 2 capsules (10 mg total) by mouth daily.  60 capsule  9    Allergies  Allergen Reactions  . Shellfish Allergy Nausea And Vomiting    Review of Systems negative except from HPI and PMH  Physical Exam BP 118/80  Pulse 76  Ht 5\' 9"  (1.753 m)  Wt 200 lb 8 oz (90.946 kg)  BMI 29.61 kg/m2 Well developed and well nourished in no acute distress HENT normal E  scleral and icterus clear Neck Supple JVP flat; carotids brisk and full Clear to ausculation Regular rate and rhythm, 2/6 systolic murmur; no diastolic murmurs were heard across the precordium Soft with active bowel sounds No clubbing cyanosis none as there is [place] Edema Alert and oriented, grossly normal motor and sensory function Skin Warm and Dry    Assessment and  Plan

## 2012-04-15 NOTE — Assessment & Plan Note (Signed)
The patient's device was interrogated.  The information was reviewed. No changes were made in the programming.    

## 2012-04-15 NOTE — Patient Instructions (Addendum)
F/U 1 YEAR WITH DR. Graciela Husbands

## 2012-04-15 NOTE — Assessment & Plan Note (Signed)
Status post aortic bioprosthesis 10-13 years ago; not exactly sure the date. No evidence of valve deterioration by his last echo or by symptoms. I will ask Dr. Excell Seltzer to offer an opinion as to the appropriate surveillance schedule  for [name]

## 2012-04-15 NOTE — Assessment & Plan Note (Signed)
No recurrent ventricular tachycardia 

## 2012-04-25 ENCOUNTER — Telehealth: Payer: Self-pay

## 2012-04-25 NOTE — Telephone Encounter (Signed)
Message copied by Marcelle Overlie on Fri Apr 25, 2012  9:32 AM ------      Message from: Duke Salvia      Created: Wed Apr 23, 2012  9:05 AM       Could you please tell pt that valve guru suggests recheck echo next year at annual visit      Thanks      steve      ----- Message -----         From: Tonny Bollman, MD         Sent: 04/15/2012   6:02 PM           To: Duke Salvia, MD            Because of mild AI, I would follow with a yearly echo      ----- Message -----         From: Duke Salvia, MD         Sent: 04/15/2012   3:05 PM           To: Tonny Bollman, MD            Kathlene November, could you look at what we know and let me know please with the right surveillance schedule is for this gentleman who has a bioprosthesis now in for more than a decade thanks Brett Canales

## 2012-04-25 NOTE — Telephone Encounter (Signed)
lmtcb

## 2012-04-25 NOTE — Telephone Encounter (Signed)
Pt informed Understanding verb 

## 2012-06-07 ENCOUNTER — Other Ambulatory Visit: Payer: Self-pay | Admitting: Internal Medicine

## 2012-07-11 ENCOUNTER — Other Ambulatory Visit: Payer: Self-pay | Admitting: Physician Assistant

## 2012-07-11 ENCOUNTER — Other Ambulatory Visit: Payer: Self-pay | Admitting: *Deleted

## 2012-07-14 MED ORDER — RAMIPRIL 5 MG PO CAPS: 10.0000 mg | ORAL_CAPSULE | Freq: Every day | ORAL | Status: DC

## 2012-07-14 MED ORDER — CARVEDILOL 25 MG PO TABS: ORAL_TABLET | ORAL | Status: DC

## 2012-07-24 ENCOUNTER — Other Ambulatory Visit: Payer: Self-pay

## 2012-07-24 ENCOUNTER — Ambulatory Visit (INDEPENDENT_AMBULATORY_CARE_PROVIDER_SITE_OTHER): Payer: 59 | Admitting: *Deleted

## 2012-07-24 DIAGNOSIS — I429 Cardiomyopathy, unspecified: Secondary | ICD-10-CM

## 2012-07-24 DIAGNOSIS — Z95810 Presence of automatic (implantable) cardiac defibrillator: Secondary | ICD-10-CM

## 2012-08-04 ENCOUNTER — Other Ambulatory Visit: Payer: Self-pay | Admitting: Cardiology

## 2012-08-04 ENCOUNTER — Other Ambulatory Visit: Payer: Self-pay | Admitting: *Deleted

## 2012-08-04 MED ORDER — RAMIPRIL 5 MG PO CAPS: 10.0000 mg | ORAL_CAPSULE | Freq: Every day | ORAL | Status: DC

## 2012-08-04 NOTE — Telephone Encounter (Signed)
Refilled Ramipril sent to [facility].

## 2012-08-11 LAB — REMOTE ICD DEVICE
BATTERY VOLTAGE: 2.57 V
BRDY-0002RV: 40 {beats}/min
CHARGE TIME: 11.3 s
DEVICE MODEL ICD: 127217
HV IMPEDENCE: 44 Ohm
PACEART VT: 0
TZAT-0002FASTVT: NEGATIVE
TZAT-0013FASTVT: 2
TZAT-0018FASTVT: NEGATIVE
TZST-0001FASTVT: 4
TZST-0001FASTVT: 5
TZST-0003FASTVT: 31 J
TZST-0003FASTVT: 31 J
VF: 0

## 2012-08-21 ENCOUNTER — Encounter: Payer: Self-pay | Admitting: *Deleted

## 2012-09-08 ENCOUNTER — Encounter: Payer: Self-pay | Admitting: Internal Medicine

## 2012-10-23 ENCOUNTER — Encounter: Payer: 59 | Admitting: *Deleted

## 2012-11-17 ENCOUNTER — Ambulatory Visit (INDEPENDENT_AMBULATORY_CARE_PROVIDER_SITE_OTHER): Payer: 59 | Admitting: *Deleted

## 2012-11-17 DIAGNOSIS — I4729 Other ventricular tachycardia: Secondary | ICD-10-CM

## 2012-11-17 DIAGNOSIS — Z95810 Presence of automatic (implantable) cardiac defibrillator: Secondary | ICD-10-CM

## 2012-11-17 DIAGNOSIS — I472 Ventricular tachycardia: Secondary | ICD-10-CM

## 2012-11-24 LAB — REMOTE ICD DEVICE
DEV-0020ICD: NEGATIVE
DEVICE MODEL ICD: 127217
FVT: 0
PACEART VT: 0
RV LEAD IMPEDENCE ICD: 614 Ohm
TOT-0006: 20140306000000
TZAT-0001FASTVT: 2
TZAT-0002FASTVT: NEGATIVE
TZON-0003FASTVT: 300 ms
TZST-0001FASTVT: 5
TZST-0001FASTVT: 6
TZST-0003FASTVT: 31 J
TZST-0003FASTVT: 31 J
VF: 0

## 2012-12-05 ENCOUNTER — Encounter: Payer: Self-pay | Admitting: Internal Medicine

## 2013-01-17 ENCOUNTER — Other Ambulatory Visit: Payer: Self-pay | Admitting: Internal Medicine

## 2013-04-05 ENCOUNTER — Telehealth: Payer: Self-pay | Admitting: Internal Medicine

## 2013-04-05 NOTE — Telephone Encounter (Signed)
Called that ICD has been beeping. Was told that ICD battery may be reaching end of life. Denies any symptoms or shocks. Asked to call Dr Odessa Fleming office first thing Monday or come in to ER if he becomes symtomatic

## 2013-04-06 ENCOUNTER — Telehealth: Payer: Self-pay | Admitting: Internal Medicine

## 2013-04-06 ENCOUNTER — Encounter: Payer: Self-pay | Admitting: Internal Medicine

## 2013-04-06 ENCOUNTER — Ambulatory Visit (INDEPENDENT_AMBULATORY_CARE_PROVIDER_SITE_OTHER): Payer: 59 | Admitting: *Deleted

## 2013-04-06 DIAGNOSIS — I472 Ventricular tachycardia: Secondary | ICD-10-CM

## 2013-04-06 LAB — MDC_IDC_ENUM_SESS_TYPE_INCLINIC
Battery Voltage: 2.55 V
HighPow Impedance: 44 Ohm
Implantable Pulse Generator Serial Number: 127217
Lead Channel Pacing Threshold Amplitude: 0.8 V
Lead Channel Sensing Intrinsic Amplitude: 16.1 mV
Lead Channel Setting Pacing Pulse Width: 0.4 ms

## 2013-04-06 NOTE — Progress Notes (Signed)
ICD check in clinic. Normal device function. Thresholds and sensing consistent with previous device measurements. Impedance trends stable over time. No evidence of any ventricular arrhythmias. No mode switches. Histogram distribution appropriate for patient and level of activity. No changes made this session. Device programmed at appropriate safety margins. Device programmed to optimize intrinsic conduction.  Pt enrolled in remote follow-up. Plan to check device every 3 months remotely and in office annually. Patient education completed including shock plan. Alert tones/vibration demonstrated for patient.  Device @ ERI.  ROV 11/25 with Dr. Graciela Husbands in [place].

## 2013-04-06 NOTE — Telephone Encounter (Signed)
New messge     Defibulator beeping all weekend. Pls advise

## 2013-04-06 NOTE — Telephone Encounter (Signed)
Device reached ERI and scheduled for SK appointment. Pt also scheduled for 11-17 to turn off alert/kwm

## 2013-04-14 ENCOUNTER — Encounter: Payer: Self-pay | Admitting: *Deleted

## 2013-04-14 ENCOUNTER — Encounter: Payer: Self-pay | Admitting: Internal Medicine

## 2013-04-14 ENCOUNTER — Other Ambulatory Visit: Payer: Self-pay | Admitting: *Deleted

## 2013-04-14 ENCOUNTER — Ambulatory Visit (INDEPENDENT_AMBULATORY_CARE_PROVIDER_SITE_OTHER): Payer: 59 | Admitting: Internal Medicine

## 2013-04-14 VITALS — BP 152/98 | HR 70 | Ht 69.0 in | Wt 210.8 lb

## 2013-04-14 DIAGNOSIS — Z01810 Encounter for preprocedural cardiovascular examination: Secondary | ICD-10-CM

## 2013-04-14 DIAGNOSIS — I429 Cardiomyopathy, unspecified: Secondary | ICD-10-CM

## 2013-04-14 DIAGNOSIS — I472 Ventricular tachycardia: Secondary | ICD-10-CM

## 2013-04-14 DIAGNOSIS — I10 Essential (primary) hypertension: Secondary | ICD-10-CM

## 2013-04-14 DIAGNOSIS — Z95810 Presence of automatic (implantable) cardiac defibrillator: Secondary | ICD-10-CM

## 2013-04-14 NOTE — Assessment & Plan Note (Signed)
We'll reassess left ventricular function

## 2013-04-14 NOTE — Assessment & Plan Note (Signed)
Blood pressure is elevated. He will check some values between now device generator replacement and will make a decision that point as to whether to augment therapy.

## 2013-04-14 NOTE — Progress Notes (Signed)
  HPI  [name] is a 37 y.o. male Seen in followup for ventricular tachycardia occurring in the context of surgically corrected congenital heart disease.. He is status post ICD implantation.  4/13 had appropriate shock for fast ventricular tachycardia  He is taking his medications as prescribed.     Echo March 2013 demonstrated left ventricular dysfunction with an ejection fraction of 40-45% with inferior posterior akinesis and aortic valve bioprosthesis. 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  Past Medical History  Diagnosis Date  . Secondary cardiomyopathy   . Ventricular septal defect   . Aortic valve replaced     Requiring replacement, specifics not available  . Syncope   . Ventricular tachycardia     appropriate VT shock therapy /13  . ICD (implantable cardiac defibrillator), BSX single   . Anxiety   . Hypertension     Past Surgical History  Procedure Laterality Date  . Vsd repair    . Aortic valve replacement      Bioprosthesis  . Angioplasty      RCA repair with vein angioplasty following injury with the aforementioned surgery  . Cardiac defibrillator placement      Current Outpatient Prescriptions  Medication Sig Dispense Refill  . carvedilol (COREG) 25 MG tablet TAKE 1 TABLET BY MOUTH TWICE DAILY WITH A MEAL  60 tablet  1  . omeprazole (PRILOSEC) 10 MG capsule Take 10 mg by mouth 2 (two) times daily.      . ramipril (ALTACE) 5 MG capsule TAKE 2 CAPSULES (10 MG TOTAL) BY MOUTH DAILY.  60 capsule  9   No current facility-administered medications for this visit.    Allergies  Allergen Reactions  . Shellfish Allergy Nausea And Vomiting    Review of Systems negative except from HPI and PMH  Physical Exam BP 152/98  Pulse 70  Ht 5' 9" (1.753 m)  Wt 210 lb 12.8 oz (95.618 kg)  BMI 31.12 kg/m2 Well developed and well nourished in no acute  distress HENT normal E scleral and icterus clear Neck Supple JVP flat; carotids brisk and full Clear to ausculation Device pocket well healed; without hematoma or erythema.  There is no tethering  Regular rate and rhythm, 2/6 systolic murmur; low pitched  diastolic murmurs were heard across the precordium Soft with active bowel sounds No clubbing cyanosis none as there is Fildes Edema Alert and oriented, grossly normal motor and sensory function Skin Warm and Dry  ECG demonstrates sinus rhythm at 70 intervals of 16/12/40 LVH with repolarization  Assessment and  Plan  

## 2013-04-14 NOTE — Assessment & Plan Note (Signed)
No intercurrent ventricular tachycardia 

## 2013-04-14 NOTE — Assessment & Plan Note (Signed)
deevice has reached ERI   We have reviewed the benefits and risks of generator replacement.  These include but are not limited to lead fracture and infection.  The patient understands, agrees and is willing to proceed.  We also discussed the fact that all these devices are wireless the are not wife I associated. It was important for him.

## 2013-04-20 ENCOUNTER — Encounter (HOSPITAL_COMMUNITY): Payer: Self-pay | Admitting: Pharmacy Technician

## 2013-04-28 ENCOUNTER — Ambulatory Visit (INDEPENDENT_AMBULATORY_CARE_PROVIDER_SITE_OTHER): Payer: 59

## 2013-04-28 DIAGNOSIS — Z01810 Encounter for preprocedural cardiovascular examination: Secondary | ICD-10-CM

## 2013-04-28 DIAGNOSIS — I472 Ventricular tachycardia: Secondary | ICD-10-CM

## 2013-04-29 LAB — CBC WITH DIFFERENTIAL/PLATELET
Basos: 1 %
Eosinophils Absolute: 0.3 10*3/uL (ref 0.0–0.4)
HCT: 43.5 % (ref 37.5–51.0)
Immature Grans (Abs): 0 10*3/uL (ref 0.0–0.1)
Lymphocytes Absolute: 0.9 10*3/uL (ref 0.7–3.1)
MCH: 28.8 pg (ref 26.6–33.0)
MCV: 88 fL (ref 79–97)
Monocytes Absolute: 0.6 10*3/uL (ref 0.1–0.9)
Neutrophils Relative %: 57 %
RBC: 4.97 x10E6/uL (ref 4.14–5.80)

## 2013-04-29 LAB — BASIC METABOLIC PANEL
BUN/Creatinine Ratio: 11 (ref 8–19)
Chloride: 104 mmol/L (ref 97–108)
GFR calc Af Amer: 107 mL/min/{1.73_m2} (ref >59)
Potassium: 5.2 mmol/L (ref 3.5–5.2)

## 2013-05-05 ENCOUNTER — Encounter (HOSPITAL_COMMUNITY)
Admission: RE | Disposition: A | Discharge status: Home / Self Care | Payer: Self-pay | Source: Ambulatory Visit | Attending: Internal Medicine

## 2013-05-05 ENCOUNTER — Ambulatory Visit (HOSPITAL_COMMUNITY)
Admission: RE | Admit: 2013-05-05 | Discharge: 2013-05-05 | Disposition: A | Discharge status: Home / Self Care | Payer: 59 | Source: Ambulatory Visit | Attending: Internal Medicine | Admitting: Internal Medicine

## 2013-05-05 DIAGNOSIS — Z952 Presence of prosthetic heart valve: Secondary | ICD-10-CM | POA: Insufficient documentation

## 2013-05-05 DIAGNOSIS — I472 Ventricular tachycardia, unspecified: Secondary | ICD-10-CM

## 2013-05-05 DIAGNOSIS — I429 Cardiomyopathy, unspecified: Secondary | ICD-10-CM | POA: Insufficient documentation

## 2013-05-05 DIAGNOSIS — Z79899 Other long term (current) drug therapy: Secondary | ICD-10-CM | POA: Insufficient documentation

## 2013-05-05 DIAGNOSIS — Z9889 Other specified postprocedural states: Secondary | ICD-10-CM | POA: Insufficient documentation

## 2013-05-05 DIAGNOSIS — Z4502 Encounter for adjustment and management of automatic implantable cardiac defibrillator: Secondary | ICD-10-CM | POA: Insufficient documentation

## 2013-05-05 DIAGNOSIS — I517 Cardiomegaly: Secondary | ICD-10-CM

## 2013-05-05 DIAGNOSIS — I10 Essential (primary) hypertension: Secondary | ICD-10-CM | POA: Insufficient documentation

## 2013-05-05 DIAGNOSIS — F411 Generalized anxiety disorder: Secondary | ICD-10-CM | POA: Insufficient documentation

## 2013-05-05 HISTORY — PX: IMPLANTABLE CARDIOVERTER DEFIBRILLATOR GENERATOR CHANGE: SHX5474

## 2013-05-05 LAB — SURGICAL PCR SCREEN: Staphylococcus aureus: NEGATIVE

## 2013-05-05 SURGERY — IMPLANTABLE CARDIOVERTER DEFIBRILLATOR GENERATOR CHANGE
Anesthesia: LOCAL

## 2013-05-05 MED ORDER — FENTANYL CITRATE 0.05 MG/ML IJ SOLN
INTRAMUSCULAR | Status: AC
  Filled 2013-05-05: qty 2

## 2013-05-05 MED ORDER — ONDANSETRON HCL 4 MG/2ML IJ SOLN: 4.0000 mg | Freq: Four times a day (QID) | INTRAMUSCULAR | Status: DC | PRN

## 2013-05-05 MED ORDER — ACETAMINOPHEN 325 MG PO TABS
325.0000 mg | ORAL_TABLET | ORAL | Status: DC | PRN
  Filled 2013-05-05: qty 2

## 2013-05-05 MED ORDER — MUPIROCIN 2 % EX OINT
TOPICAL_OINTMENT | Freq: Two times a day (BID) | CUTANEOUS | Status: DC
  Administered 2013-05-05: 1 via NASAL
  Filled 2013-05-05: qty 22

## 2013-05-05 MED ORDER — HEPARIN (PORCINE) IN NACL 2-0.9 UNIT/ML-% IJ SOLN
INTRAMUSCULAR | Status: AC
  Filled 2013-05-05: qty 500

## 2013-05-05 MED ORDER — MUPIROCIN 2 % EX OINT
TOPICAL_OINTMENT | CUTANEOUS | Status: AC
  Filled 2013-05-05: qty 22

## 2013-05-05 MED ORDER — SODIUM CHLORIDE 0.9 % IV SOLN: INTRAVENOUS | Status: DC

## 2013-05-05 MED ORDER — SODIUM CHLORIDE 0.9 % IV SOLN
INTRAVENOUS | Status: DC
  Administered 2013-05-05: 13:00:00 via INTRAVENOUS

## 2013-05-05 MED ORDER — MIDAZOLAM HCL 5 MG/5ML IJ SOLN
INTRAMUSCULAR | Status: AC
  Filled 2013-05-05: qty 5

## 2013-05-05 MED ORDER — CHLORHEXIDINE GLUCONATE 4 % EX LIQD
60.0000 mL | Freq: Once | CUTANEOUS | Status: DC
  Filled 2013-05-05: qty 60

## 2013-05-05 MED ORDER — CEFAZOLIN SODIUM-DEXTROSE 2-3 GM-% IV SOLR
2.0000 g | INTRAVENOUS | Status: DC
  Filled 2013-05-05: qty 50

## 2013-05-05 MED ORDER — GENTAMICIN SULFATE 40 MG/ML IJ SOLN
80.0000 mg | INTRAMUSCULAR | Status: DC
  Filled 2013-05-05: qty 2

## 2013-05-05 MED ORDER — LIDOCAINE HCL (PF) 1 % IJ SOLN
INTRAMUSCULAR | Status: AC
  Filled 2013-05-05: qty 60

## 2013-05-05 NOTE — Discharge Instructions (Signed)
Pacemaker Battery Change °A pacemaker battery usually lasts 4 to 12 years. Once or twice per year, you will be asked to visit your health care provider to have a full evaluation of your pacemaker. When a battery needs to be replaced, the entire pacemaker is replaced so that you can benefit from new circuitry and any new features that have been added to pacemakers. Most often, this procedure is very simple because the leads are already in place.  °There are many things that affect how long a pacemaker battery will last, including:  °· The age of the pacemaker.   °· The number of leads (1, 2, or 3).   °· The pacemaker work load. If the pacemaker is helping the heart more often, the battery will not last as long as it would if the pacemaker did not need to help the heart.   °· Power (voltage) settings.  °LET YOUR HEALTH CARE PROVIDER KNOW ABOUT:  °· Any allergies you have.   °· All medicines you are taking, including vitamins, herbs, eye drops, creams, and over-the-counter medicines.   °· Previous problems you or members of your family have had with the use of anesthetics.   °· Any blood disorders you have.   °· Previous surgeries you have had, especially since your last pacemaker placement.   °· Medical conditions you have.   °· Possible pregnancy, if applicable. °· Symptoms of chest pain, trouble breathing, palpitations, lightheadedness, or feelings of an abnormal or irregular heartbeat. °RISKS AND COMPLICATIONS  °Generally, this is a safe procedure. However, as with any procedure, complications can occur. Possible complications include:  °· Bleeding.   °· Bruising of the skin around where the incision was made.   °· Pain at the incision site.   °· Pulling apart of the skin at the incision site.   °· Infection.   °· Allergic reaction to anesthetics or other medicines used during the procedure.   °Diabetics may have a temporary increase in their blood sugar after any surgical procedure.  °BEFORE THE PROCEDURE  °· Wash  all of the skin around the area of the chest where the pacemaker is located.   °· Ask your health care provider for help with any medicine adjustments before the pacemaker is replaced.   °· Unless advised otherwise, do not eat or drink after midnight on the night before the procedure. You may drink water to take your medicine as you normally would or as directed. °PROCEDURE  °· After giving medicine to numb the skin, your health care provider will make a cut to reopen the pocket holding the pacemaker.   °· The old pacemaker will be disconnected from its leads.   °· The leads will be tested.   °· If needed, the leads will be replaced. If the leads are functioning properly, the new pacemaker may be connected to the existing leads. °· A heart monitor and the pacemaker programmer will be used to make sure that the new pacemaker is working properly. °· The incision site is then closed. A dressing is placed over the pacemaker site. The dressing is removed 24 to 48 hours afterwards. °AFTER THE PROCEDURE  °· You will be taken to a recovery area after the new pacemaker implant is completed. Your vital signs such as blood pressure, heart rate, breathing, and oxygen levels will be monitored. °· Your health care provider will tell you when you will need to next test your pacemaker or when to return to the office for follow-up for removal of stitches. °Document Released: 08/15/2006 Document Revised: 01/07/2013 Document Reviewed: 11/19/2012 °[employer]® Patient Information ©2014 [employer]. ° °

## 2013-05-05 NOTE — Progress Notes (Signed)
  Echocardiogram 2D Echocardiogram has been performed.  Arvil Chaco 05/05/2013, 12:03 PM

## 2013-05-05 NOTE — Interval H&P Note (Signed)
ICD Criteria  Current LVEF (within 6 months):NA%  NYHA Functional Classification: Class II  Heart Failure History:  Yes, Duration of heart failure since onset is > 9 months  Non-Ischemic Dilated Cardiomyopathy History:  Yes, timeframe is > 9 months  Atrial Fibrillation/Atrial Flutter:  No.  Ventricular Tachycardia History:  Yes, Hemodynamic status unknown. VT Type:  SVT - Monomorphic.  Cardiac Arrest History:  No  History of Syndromes with Risk of Sudden Death:  No.  Previous ICD:  Yes, ICD Type:  Single, Reason for ICD:  Secondary, Ventricular tachycardia  Electrophysiology Study: No.  Prior MI: No.  PPM: No.  OSA:  No  Patient Life Expectancy of >=1 year: Yes.  Anticoagulation Therapy:  Patient is NOT on anticoagulation therapy.   Beta Blocker Therapy:  Yes.   Ace Inhibitor/ARB Therapy:  Yes.History and Physical Interval Note:  05/05/2013 1:47 PM  [name]  has presented today for surgery, with the diagnosis of vt  The various methods of treatment have been discussed with the patient and family. After consideration of risks, benefits and other options for treatment, the patient has consented to  Procedure(s): IMPLANTABLE CARDIOVERTER DEFIBRILLATOR GENERATOR CHANGE (N/A) as a surgical intervention .  The patient's history has been reviewed, patient examined, no change in status, stable for surgery.  I have reviewed the patient's chart and labs.  Questions were answered to the patient's satisfaction.     Sherryl Manges

## 2013-05-05 NOTE — H&P (View-Only) (Signed)
  HPI  [name] is a 37 y.o. male Seen in followup for ventricular tachycardia occurring in the context of surgically corrected congenital heart disease.Marland Kitchen He is status post ICD implantation.  4/13 had appropriate shock for fast ventricular tachycardia  He is taking his medications as prescribed.     Echo March 2013 demonstrated left ventricular dysfunction with an ejection fraction of 40-45% with inferior posterior akinesis and aortic valve bioprosthesis. 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  Past Medical History  Diagnosis Date  . Secondary cardiomyopathy   . Ventricular septal defect   . Aortic valve replaced     Requiring replacement, specifics not available  . Syncope   . Ventricular tachycardia     appropriate VT shock therapy /13  . ICD (implantable cardiac defibrillator), BSX single   . Anxiety   . Hypertension     Past Surgical History  Procedure Laterality Date  . Vsd repair    . Aortic valve replacement      Bioprosthesis  . Angioplasty      RCA repair with vein angioplasty following injury with the aforementioned surgery  . Cardiac defibrillator placement      Current Outpatient Prescriptions  Medication Sig Dispense Refill  . carvedilol (COREG) 25 MG tablet TAKE 1 TABLET BY MOUTH TWICE DAILY WITH A MEAL  60 tablet  1  . omeprazole (PRILOSEC) 10 MG capsule Take 10 mg by mouth 2 (two) times daily.      . ramipril (ALTACE) 5 MG capsule TAKE 2 CAPSULES (10 MG TOTAL) BY MOUTH DAILY.  60 capsule  9   No current facility-administered medications for this visit.    Allergies  Allergen Reactions  . Shellfish Allergy Nausea And Vomiting    Review of Systems negative except from HPI and PMH  Physical Exam BP 152/98  Pulse 70  Ht 5\' 9"  (1.753 m)  Wt 210 lb 12.8 oz (95.618 kg)  BMI 31.12 kg/m2 Well developed and well nourished in no acute  distress HENT normal E scleral and icterus clear Neck Supple JVP flat; carotids brisk and full Clear to ausculation Device pocket well healed; without hematoma or erythema.  There is no tethering  Regular rate and rhythm, 2/6 systolic murmur; low pitched  diastolic murmurs were heard across the precordium Soft with active bowel sounds No clubbing cyanosis none as there is Gutzmer Edema Alert and oriented, grossly normal motor and sensory function Skin Warm and Dry  ECG demonstrates sinus rhythm at 70 intervals of 16/12/40 LVH with repolarization  Assessment and  Plan

## 2013-05-05 NOTE — CV Procedure (Signed)
Preoperative diagnosis  Corrected congenital heart disease with VT  Prior ICD now @ [facility] Postoperative diagnosis same/  Procedure: Generator replacement     Following informed consent the patient was brought to the electrophysiology laboratory in place of the fluoroscopic table in the supine position after routine prep and drape lidocaine was infiltrated in the region of the previous incision and carried down to later the device pocket using sharp dissection and electrocautery. The pocket was opened the device was freed up and was explanted.  Interrogation of the previously implanted ICD ventricular lead [employer]  demonstrated an R wave of 22.8  millivolts., and impedance of 681 ohms, and a pacing threshold of 0.9 volts at 0.5 msec.   The leads were inspected. Repair was needed. The leads were then attached to a [employer]   pulse generator, serial number [id number].    Through the device the ICD ventricular lead demonstrated an R wave of 23  millivolts., and impedance of 614 ohms, and a pacing threshold of 1.0 volts at 0.4 msec.    High voltage impedances were 51 ohms  The pocket was irrigated with antibiotic containing saline solution hemostasis was assured and the leads and the device were placed in the pocket. The wound was then closed in 2 layers w monocryl for the surface layer  The patient tolerated the procedure without apparent complication.  DFT testing was not  performed  Sherryl Manges   \

## 2013-05-08 ENCOUNTER — Telehealth: Payer: Self-pay

## 2013-05-08 NOTE — Telephone Encounter (Signed)
Spoke w/ pt.  He is agreeable to setting up TEE. TEE sched at [facility] for 12/29 at 8:00.   Pt to arrive at 7:00 and verbalizes understanding of instructions. Mailed instructions to pt.

## 2013-05-17 ENCOUNTER — Other Ambulatory Visit: Payer: Self-pay | Admitting: Internal Medicine

## 2013-05-18 ENCOUNTER — Other Ambulatory Visit: Payer: Self-pay | Admitting: Physician Assistant

## 2013-05-18 ENCOUNTER — Telehealth: Payer: Self-pay

## 2013-05-18 NOTE — Telephone Encounter (Signed)
Attempted to call pt to see about scheduling TEE for tomorrow am.  Left message for pt to call back.

## 2013-05-18 NOTE — Telephone Encounter (Signed)
Spoke w/ pt yesterday, 05/17/13 to cancel TEE sched for today, as Dr. Mariah Milling is out of town w/ an emergency. Pt is agreeable to cancelling procedure at this time and for our office to call to reschedule.  Left message on pt's voicemail for pt to call back to reschedule.

## 2013-05-20 NOTE — Telephone Encounter (Signed)
Spoke w/ pt.  He reports that he needs to check w/ his job before rescheduling TEE. States that he will call back before the end of the day.

## 2013-05-22 NOTE — Telephone Encounter (Signed)
Left message on pt's mobile and home #s to call back to resched TEE.

## 2013-05-25 ENCOUNTER — Telehealth: Payer: Self-pay

## 2013-05-25 NOTE — Telephone Encounter (Signed)
Spoke w/ pt.  He states that he would like to reschedule his TEE for next week, but wants to make sure that he can take a cab home, as he does not have anyone that can drive him. TEE sched for 06/02/13 @ [place] @ 8:00am. Pt is aware of time, location, instructions and to arrive at the medical mall entrance of [facility] @ 7:00am.

## 2013-05-25 NOTE — Telephone Encounter (Signed)
Left message for pt to call back to reschedule TEE. Mailed letter to call the office to reschedule this.

## 2013-06-02 ENCOUNTER — Telehealth: Payer: Self-pay

## 2013-06-02 ENCOUNTER — Ambulatory Visit: Payer: Self-pay | Admitting: Internal Medicine

## 2013-06-02 DIAGNOSIS — I517 Cardiomegaly: Secondary | ICD-10-CM

## 2013-06-02 NOTE — Telephone Encounter (Signed)
Leigh Aurora from [facility] called to make follow up from TEE, states it was normal, and not sure when he needs to schedule. Dr Rockey Situ per formed TEE, but wants pt to follow up with Caryl Comes. Please advise when pt should see Dr Caryl Comes. I told Juliann Pulse we would call pt with appt.

## 2013-06-03 ENCOUNTER — Encounter: Payer: Self-pay | Admitting: Cardiovascular Disease

## 2013-06-10 NOTE — Telephone Encounter (Signed)
ple ase schedule him to come in at next time in [place] to schedule his devicde replacment Thanks steve

## 2013-06-11 ENCOUNTER — Telehealth: Payer: Self-pay | Admitting: *Deleted

## 2013-06-11 NOTE — Telephone Encounter (Signed)
Scheduled appt with patient

## 2013-06-15 ENCOUNTER — Other Ambulatory Visit: Payer: Self-pay | Admitting: Internal Medicine

## 2013-06-23 ENCOUNTER — Ambulatory Visit (INDEPENDENT_AMBULATORY_CARE_PROVIDER_SITE_OTHER): Payer: 59 | Admitting: Internal Medicine

## 2013-06-23 ENCOUNTER — Encounter: Payer: Self-pay | Admitting: Internal Medicine

## 2013-06-23 VITALS — BP 98/70 | HR 65 | Ht 69.5 in | Wt 212.0 lb

## 2013-06-23 DIAGNOSIS — Z954 Presence of other heart-valve replacement: Secondary | ICD-10-CM

## 2013-06-23 DIAGNOSIS — I4729 Other ventricular tachycardia: Secondary | ICD-10-CM

## 2013-06-23 DIAGNOSIS — I429 Cardiomyopathy, unspecified: Secondary | ICD-10-CM

## 2013-06-23 DIAGNOSIS — I472 Ventricular tachycardia: Secondary | ICD-10-CM

## 2013-06-23 LAB — MDC_IDC_ENUM_SESS_TYPE_INCLINIC
Date Time Interrogation Session: 20150203050000
HighPow Impedance: 53 Ohm
Implantable Pulse Generator Serial Number: 126781
Lead Channel Impedance Value: 649 Ohm
Lead Channel Sensing Intrinsic Amplitude: 20.1 mV
Lead Channel Setting Pacing Pulse Width: 0.4 ms
MDC IDC MSMT LEADCHNL RV PACING THRESHOLD AMPLITUDE: 1 V
MDC IDC MSMT LEADCHNL RV PACING THRESHOLD PULSEWIDTH: 0.4 ms
MDC IDC SET LEADCHNL RV PACING AMPLITUDE: 2.4 V
MDC IDC SET LEADCHNL RV SENSING SENSITIVITY: 0.6 mV
MDC IDC SET ZONE DETECTION INTERVAL: 300 ms
Zone Setting Detection Interval: 250 ms

## 2013-06-23 MED ORDER — RAMIPRIL 5 MG PO CAPS: 10.0000 mg | ORAL_CAPSULE | Freq: Every day | ORAL | Status: DC

## 2013-06-23 NOTE — Progress Notes (Signed)
Patient Care Team: Provider Not In System as PCP - General   HPI  [name] is a 38 y.o. male Seen in followup for ventricular tachycardia occurring in the context of surgically corrected congenital heart disease.Marland Kitchen He is status post ICD implantation. 4/13 had appropriate shock for fast ventricular tachycardia He underwent device generator replacement 12/14    Echo March 2013 demonstrated left ventricular dysfunction with an ejection fraction of 40-45% with inferior posterior akinesis and aortic valve bioprosthesis. 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  Echo 12/14 demonstrated mild worsening of LV function 35-40%.with questions regarding aortic valve and possible stenosis with a bioprosthesis;  TEE was undertaken by Dr. Deidre Ala demonstrated that normal LV function no significant aortic stenosis with well visualized aortic valve opening.  His major complaint today is of dizziness upon standing often requiring him to squat back down especially at work   Past Medical History  Diagnosis Date  . Secondary cardiomyopathy   . Ventricular septal defect   . Aortic valve replaced     Requiring replacement, specifics not available  . Syncope   . Ventricular tachycardia     appropriate VT shock therapy /13  . ICD (implantable cardiac defibrillator), BSX single   . Anxiety   . Hypertension     Past Surgical History  Procedure Laterality Date  . Vsd repair    . Aortic valve replacement      Bioprosthesis  . Angioplasty      RCA repair with vein angioplasty following injury with the aforementioned surgery  . Cardiac defibrillator placement      Current Outpatient Prescriptions  Medication Sig Dispense Refill  . carvedilol (COREG) 25 MG tablet TAKE 1 TABLET BY MOUTH TWICE DAILY  60 tablet  6  . fluticasone (FLONASE) 50 MCG/ACT nasal spray Place 2 sprays into both nostrils  daily as needed for allergies.      Marland Kitchen loratadine (CLARITIN) 5 MG chewable tablet Chew 5 mg by mouth daily as needed for allergies.      Marland Kitchen omeprazole (PRILOSEC) 10 MG capsule Take 10 mg by mouth 2 (two) times daily as needed (acid reflux).       . ramipril (ALTACE) 5 MG capsule Take 10 mg by mouth daily.       No current facility-administered medications for this visit.    Allergies  Allergen Reactions  . Shellfish Allergy Nausea And Vomiting    Review of Systems negative except from HPI and PMH  Physical Exam Ht 5' 9.5" (1.765 m)  Wt 212 lb (96.163 kg)  BMI 30.87 kg/m2 Well developed and well nourished in no acute distress HENT normal E scleral and icterus clear Neck Supple JVP flat; carotids brisk and full Clear to ausculation  Device pocket well healed; without hematoma or erythema.  There is no tethering Regular rate and rhythm, 2/6 murmur Soft with active bowel sounds No clubbing cyanosis none Edema Alert and oriented, grossly normal motor and sensory function Skin Warm and Dry  ECG demonstrates sinus rhythm at 65 Intervals 18/11/41 LVH with repolarization  Assessment and plan  Ventricular tachycardia  no intercurrent ventricular tachycardia  Hypertension now with orthostatic hypotension We'll   change his ramipril to take it at nigh; if this does not suffice, will have him decrease his a.m. Carvedilol 25--12.5 and continue 25 mg p.m. Aortic valve-bioprosthesis there was a major discrepancy between his  transthoracic and transesophageal echo.  ICD-[employer]  The patient's device was interrogated.  The information was reviewed. No changes were made in the programming.     Cardiomyopathy-secondary As above

## 2013-06-23 NOTE — Patient Instructions (Signed)
Your physician has recommended you make the following change in your medication:  Take Altace at night   Your physician wants you to follow-up in: 6 months. You will receive a reminder letter in the mail two months in advance. If you don't receive a letter, please call our office to schedule the follow-up appointment.

## 2013-07-12 ENCOUNTER — Other Ambulatory Visit: Payer: Self-pay | Admitting: Internal Medicine

## 2013-08-11 ENCOUNTER — Other Ambulatory Visit: Payer: Self-pay | Admitting: Internal Medicine

## 2013-09-24 ENCOUNTER — Encounter: Payer: Self-pay | Admitting: Internal Medicine

## 2013-09-24 ENCOUNTER — Ambulatory Visit (INDEPENDENT_AMBULATORY_CARE_PROVIDER_SITE_OTHER): Payer: 59 | Admitting: *Deleted

## 2013-09-24 DIAGNOSIS — I428 Other cardiomyopathies: Secondary | ICD-10-CM

## 2013-09-24 DIAGNOSIS — I472 Ventricular tachycardia: Secondary | ICD-10-CM

## 2013-09-24 DIAGNOSIS — I4729 Other ventricular tachycardia: Secondary | ICD-10-CM

## 2013-09-24 LAB — MDC_IDC_ENUM_SESS_TYPE_REMOTE
Brady Statistic RV Percent Paced: 0 %
HighPow Impedance: 55 Ohm
Lead Channel Impedance Value: 647 Ohm
Lead Channel Sensing Intrinsic Amplitude: 21 mV
Lead Channel Setting Pacing Pulse Width: 0.4 ms
Lead Channel Setting Sensing Sensitivity: 0.6 mV
MDC IDC PG SERIAL: 126781
MDC IDC SET LEADCHNL RV PACING AMPLITUDE: 2.4 V
MDC IDC SET ZONE DETECTION INTERVAL: 250 ms
MDC IDC SET ZONE DETECTION INTERVAL: 300 ms

## 2013-10-06 NOTE — Progress Notes (Signed)
Remote ICD transmission.   

## 2013-10-16 ENCOUNTER — Encounter: Payer: Self-pay | Admitting: Cardiology

## 2013-11-28 ENCOUNTER — Other Ambulatory Visit: Payer: Self-pay | Admitting: Internal Medicine

## 2013-12-28 ENCOUNTER — Ambulatory Visit (INDEPENDENT_AMBULATORY_CARE_PROVIDER_SITE_OTHER): Payer: 59 | Admitting: *Deleted

## 2013-12-28 DIAGNOSIS — I429 Cardiomyopathy, unspecified: Secondary | ICD-10-CM

## 2013-12-28 DIAGNOSIS — I472 Ventricular tachycardia: Secondary | ICD-10-CM

## 2013-12-28 DIAGNOSIS — I428 Other cardiomyopathies: Secondary | ICD-10-CM

## 2013-12-28 DIAGNOSIS — I4729 Other ventricular tachycardia: Secondary | ICD-10-CM

## 2013-12-28 NOTE — Progress Notes (Signed)
Remote ICD transmission.   

## 2014-01-15 LAB — MDC_IDC_ENUM_SESS_TYPE_REMOTE
Battery Remaining Longevity: 12
Brady Statistic RV Percent Paced: 0 %
HIGH POWER IMPEDANCE MEASURED VALUE: 51 Ohm
Lead Channel Impedance Value: 608 Ohm
Lead Channel Setting Pacing Amplitude: 2.4 V
Lead Channel Setting Pacing Pulse Width: 0.4 ms
MDC IDC MSMT LEADCHNL RV SENSING INTR AMPL: 21.9 mV
MDC IDC PG SERIAL: 126781
MDC IDC SET LEADCHNL RV SENSING SENSITIVITY: 0.6 mV
MDC IDC SET ZONE DETECTION INTERVAL: 300 ms
Zone Setting Detection Interval: 250 ms

## 2014-01-28 ENCOUNTER — Encounter: Payer: Self-pay | Admitting: Cardiology

## 2014-02-03 ENCOUNTER — Encounter: Payer: Self-pay | Admitting: Internal Medicine

## 2014-03-03 ENCOUNTER — Encounter: Payer: Self-pay | Admitting: *Deleted

## 2014-03-23 ENCOUNTER — Other Ambulatory Visit: Payer: Self-pay | Admitting: Internal Medicine

## 2014-04-29 ENCOUNTER — Encounter (HOSPITAL_COMMUNITY): Payer: Self-pay | Admitting: Internal Medicine

## 2014-05-25 ENCOUNTER — Encounter: Payer: Self-pay | Admitting: *Deleted

## 2014-05-25 ENCOUNTER — Encounter: Payer: 59 | Admitting: Internal Medicine

## 2014-05-26 ENCOUNTER — Encounter: Payer: Self-pay | Admitting: *Deleted

## 2014-06-07 ENCOUNTER — Telehealth: Payer: Self-pay | Admitting: Internal Medicine

## 2014-06-07 NOTE — Telephone Encounter (Signed)
lmov for patient, need to rsch his apt for I put it in the wrong slot. He needs to be in a 6 month fu in a 15 min slot.

## 2014-06-21 ENCOUNTER — Ambulatory Visit (INDEPENDENT_AMBULATORY_CARE_PROVIDER_SITE_OTHER): Payer: 59 | Admitting: Internal Medicine

## 2014-06-21 ENCOUNTER — Encounter: Payer: Self-pay | Admitting: Internal Medicine

## 2014-06-21 VITALS — BP 148/86 | HR 81 | Ht 69.0 in | Wt 219.0 lb

## 2014-06-21 DIAGNOSIS — I472 Ventricular tachycardia, unspecified: Secondary | ICD-10-CM

## 2014-06-21 DIAGNOSIS — Z4502 Encounter for adjustment and management of automatic implantable cardiac defibrillator: Secondary | ICD-10-CM

## 2014-06-21 LAB — MDC_IDC_ENUM_SESS_TYPE_INCLINIC
Date Time Interrogation Session: 20160201050000
HighPow Impedance: 55 Ohm
Implantable Pulse Generator Serial Number: 126781
Lead Channel Impedance Value: 649 Ohm
Lead Channel Pacing Threshold Amplitude: 1 V
Lead Channel Sensing Intrinsic Amplitude: 25 mV
Lead Channel Setting Pacing Amplitude: 2.4 V
Lead Channel Setting Pacing Pulse Width: 0.4 ms
Lead Channel Setting Sensing Sensitivity: 0.6 mV
MDC IDC MSMT LEADCHNL RV PACING THRESHOLD PULSEWIDTH: 0.4 ms
MDC IDC SET ZONE DETECTION INTERVAL: 250 ms
Zone Setting Detection Interval: 300 ms

## 2014-06-21 NOTE — Progress Notes (Signed)
No care team member to display   HPI  [name] is a 39 y.o. male Seen in followup for ventricular tachycardia occurring in the context of surgically corrected congenital heart disease.Marland Kitchen He is status post ICD implantation. 4/13 had appropriate shock for fast ventricular tachycardia He underwent device generator replacement 12/14    Echo March 2013 demonstrated left ventricular dysfunction with an ejection fraction of 40-45% with inferior posterior akinesis and aortic valve bioprosthesis. 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  Echo 12/14 demonstrated mild worsening of LV function 35-40%.with questions regarding aortic valve and possible stenosis with a bioprosthesis;  TEE 1/15 was undertaken by Dr. Deidre Ala demonstrated that normal LV function no significant aortic stenosis with well visualized aortic valve opening.  The patient denies chest pain, shortness of breath, nocturnal dyspnea, orthopnea or peripheral edema.  There have been no palpitations, lightheadedness or syncope.   He he describes being very tired. He denies snoring. His sleep disturbances that he awakens in the middle of the night and can't go back to sleep. He nods engagingly w query as to whether he is more irritable.      Past Medical History  Diagnosis Date  . Secondary cardiomyopathy   . Ventricular septal defect   . Aortic valve replaced     Requiring replacement, specifics not available  . Syncope   . Ventricular tachycardia     appropriate VT shock therapy /13  . ICD (implantable cardiac defibrillator), BSX single   . Anxiety   . Hypertension     Past Surgical History  Procedure Laterality Date  . Vsd repair    . Aortic valve replacement      Bioprosthesis  . Angioplasty      RCA repair with vein angioplasty following injury with the aforementioned surgery  . Cardiac defibrillator placement     . Implantable cardioverter defibrillator generator change N/A 05/05/2013    Procedure: IMPLANTABLE CARDIOVERTER DEFIBRILLATOR GENERATOR CHANGE;  Surgeon: Deboraha Sprang, MD;  Location: [facility] CATH LAB;  Service: Cardiovascular;  Laterality: N/A;    Current Outpatient Prescriptions  Medication Sig Dispense Refill  . carvedilol (COREG) 25 MG tablet TAKE 1 TABLET BY MOUTH TWICE DAILY 60 tablet 6  . loratadine (CLARITIN) 5 MG chewable tablet Chew 5 mg by mouth daily as needed for allergies.    Marland Kitchen omeprazole (PRILOSEC) 10 MG capsule Take 10 mg by mouth 2 (two) times daily as needed (acid reflux).     . ramipril (ALTACE) 5 MG capsule TAKE 2 CAPSULES BY MOUTH EVERY NIGHT AT BEDTIME 90 capsule 3   No current facility-administered medications for this visit.    Allergies  Allergen Reactions  . Shellfish Allergy Nausea And Vomiting    Review of Systems negative except from HPI and PMH  Physical Exam BP 148/86 mmHg  Pulse 81  Ht 5\' 9"  (1.753 m)  Wt 219 lb (99.338 kg)  BMI 32.33 kg/m2 Well developed and well nourished in no acute distress HENT normal E scleral and icterus clear Neck Supple JVP flat; carotids brisk and full Clear to ausculation  Device pocket well healed; without hematoma or erythema.  There is no tethering Regular rate and rhythm, 2/6 murmur Soft with active bowel sounds No clubbing cyanosis none Edema Alert and oriented, grossly normal motor and sensory function Skin Warm and Dry  ECG demonstrates sinus rhythm at 65 Intervals 18/11/41  LVH with repolarization  Assessment and plan  Ventricular tachycardia  no intercurrent ventricular tachycardia  Hypertension   Sleep disturbance and irritability   Aortic valve-bioprosthesis   ICD-[employer]  The patient's device was interrogated.  The information was reviewed. No changes were made in the programming.      Cardiomyopathy-secondary   Blood pressure is elevated. Is not clear whether this represents  whitecoat hypertension or not. I suggested that he get a blood pressure cuff   We had a lengthy discussion regarding irritability sleep disturbance and the potential that it represents depression. He will ask his family and pursue follow-up for this with his primary care physician.  We spent more than 50% of our >40 min visit in face to face counseling regarding the above

## 2014-06-21 NOTE — Patient Instructions (Signed)

## 2014-06-25 ENCOUNTER — Encounter: Payer: Self-pay | Admitting: Internal Medicine

## 2014-07-28 ENCOUNTER — Other Ambulatory Visit: Payer: Self-pay | Admitting: Internal Medicine

## 2014-09-23 ENCOUNTER — Ambulatory Visit (INDEPENDENT_AMBULATORY_CARE_PROVIDER_SITE_OTHER): Payer: 59 | Admitting: *Deleted

## 2014-09-23 DIAGNOSIS — Z95810 Presence of automatic (implantable) cardiac defibrillator: Secondary | ICD-10-CM | POA: Diagnosis not present

## 2014-09-23 DIAGNOSIS — I472 Ventricular tachycardia: Secondary | ICD-10-CM | POA: Diagnosis not present

## 2014-09-23 DIAGNOSIS — I4729 Other ventricular tachycardia: Secondary | ICD-10-CM

## 2014-09-28 LAB — CUP PACEART REMOTE DEVICE CHECK
Battery Remaining Longevity: 144 mo
Battery Remaining Percentage: 100 %
Brady Statistic RV Percent Paced: 0 %
Date Time Interrogation Session: 20160505054100
HighPow Impedance: 53 Ohm
Lead Channel Impedance Value: 610 Ohm
Lead Channel Pacing Threshold Amplitude: 1 V
Lead Channel Pacing Threshold Pulse Width: 0.4 ms
Lead Channel Sensing Intrinsic Amplitude: 22.5 mV
Lead Channel Setting Pacing Pulse Width: 0.4 ms
Lead Channel Setting Sensing Sensitivity: 0.6 mV
MDC IDC SET LEADCHNL RV PACING AMPLITUDE: 2.4 V
MDC IDC SET ZONE DETECTION INTERVAL: 250 ms
Pulse Gen Serial Number: 126781
Zone Setting Detection Interval: 300 ms

## 2014-10-11 ENCOUNTER — Encounter: Payer: Self-pay | Admitting: Cardiology

## 2014-10-19 ENCOUNTER — Encounter: Payer: Self-pay | Admitting: Internal Medicine

## 2014-10-24 ENCOUNTER — Other Ambulatory Visit: Payer: Self-pay | Admitting: Family Medicine

## 2014-10-24 DIAGNOSIS — F320 Major depressive disorder, single episode, mild: Secondary | ICD-10-CM

## 2014-10-24 DIAGNOSIS — F32A Depression, unspecified: Secondary | ICD-10-CM

## 2014-12-04 ENCOUNTER — Other Ambulatory Visit: Payer: Self-pay | Admitting: Internal Medicine

## 2014-12-23 ENCOUNTER — Ambulatory Visit (INDEPENDENT_AMBULATORY_CARE_PROVIDER_SITE_OTHER): Payer: 59 | Admitting: *Deleted

## 2014-12-23 DIAGNOSIS — I472 Ventricular tachycardia, unspecified: Secondary | ICD-10-CM

## 2014-12-23 NOTE — Progress Notes (Signed)
Remote ICD transmission.   

## 2014-12-30 LAB — CUP PACEART REMOTE DEVICE CHECK
HighPow Impedance: 56 Ohm
Lead Channel Impedance Value: 628 Ohm
Lead Channel Sensing Intrinsic Amplitude: 21 mV
Lead Channel Setting Sensing Sensitivity: 0.6 mV
MDC IDC MSMT BATTERY REMAINING LONGEVITY: 144 mo
MDC IDC MSMT BATTERY REMAINING PERCENTAGE: 100 %
MDC IDC SESS DTM: 20160804054100
MDC IDC SET LEADCHNL RV PACING AMPLITUDE: 2.4 V
MDC IDC SET LEADCHNL RV PACING PULSEWIDTH: 0.4 ms
MDC IDC STAT BRADY RV PERCENT PACED: 0 %
Pulse Gen Serial Number: 126781
Zone Setting Detection Interval: 250 ms
Zone Setting Detection Interval: 300 ms

## 2015-01-28 ENCOUNTER — Encounter: Payer: Self-pay | Admitting: Cardiology

## 2015-02-08 ENCOUNTER — Encounter: Payer: Self-pay | Admitting: Internal Medicine

## 2015-03-02 ENCOUNTER — Other Ambulatory Visit: Payer: Self-pay | Admitting: Family Medicine

## 2015-03-25 ENCOUNTER — Encounter: Payer: Self-pay | Admitting: Cardiovascular Disease

## 2015-03-28 ENCOUNTER — Ambulatory Visit (INDEPENDENT_AMBULATORY_CARE_PROVIDER_SITE_OTHER): Payer: 59 | Admitting: *Deleted

## 2015-03-28 DIAGNOSIS — I472 Ventricular tachycardia, unspecified: Secondary | ICD-10-CM

## 2015-03-29 LAB — CUP PACEART REMOTE DEVICE CHECK
Battery Remaining Longevity: 144 mo
Brady Statistic RV Percent Paced: 0 %
Date Time Interrogation Session: 20161107065400
HighPow Impedance: 53 Ohm
Implantable Lead Location: 753860
Implantable Lead Serial Number: 178017
Lead Channel Impedance Value: 589 Ohm
Lead Channel Setting Sensing Sensitivity: 0.6 mV
MDC IDC LEAD IMPLANT DT: 20080214
MDC IDC LEAD MODEL: 185
MDC IDC MSMT BATTERY REMAINING PERCENTAGE: 100 %
MDC IDC PG SERIAL: 126781
MDC IDC SET LEADCHNL RV PACING AMPLITUDE: 2.4 V
MDC IDC SET LEADCHNL RV PACING PULSEWIDTH: 0.4 ms

## 2015-03-29 NOTE — Progress Notes (Signed)
Remote ICD transmission.   

## 2015-03-30 ENCOUNTER — Encounter: Payer: Self-pay | Admitting: Cardiology

## 2015-04-25 ENCOUNTER — Other Ambulatory Visit: Payer: Self-pay | Admitting: *Deleted

## 2015-04-25 NOTE — Telephone Encounter (Signed)
Lmovm to contact office to verify Ramipril instructions.

## 2015-04-26 ENCOUNTER — Encounter: Payer: Self-pay | Admitting: Family Medicine

## 2015-04-26 MED ORDER — RAMIPRIL 5 MG PO CAPS
10.0000 mg | ORAL_CAPSULE | Freq: Every day | ORAL | Status: DC
Start: 2015-04-26 — End: 2015-07-07

## 2015-05-10 ENCOUNTER — Ambulatory Visit (INDEPENDENT_AMBULATORY_CARE_PROVIDER_SITE_OTHER): Payer: 59 | Admitting: Family Medicine

## 2015-05-10 ENCOUNTER — Encounter: Payer: Self-pay | Admitting: Family Medicine

## 2015-05-10 VITALS — HR 78 | Temp 98.0°F | Resp 16 | Wt 212.7 lb

## 2015-05-10 DIAGNOSIS — Z13220 Encounter for screening for lipoid disorders: Secondary | ICD-10-CM

## 2015-05-10 DIAGNOSIS — Z125 Encounter for screening for malignant neoplasm of prostate: Secondary | ICD-10-CM | POA: Diagnosis not present

## 2015-05-10 DIAGNOSIS — Z2821 Immunization not carried out because of patient refusal: Secondary | ICD-10-CM

## 2015-05-10 DIAGNOSIS — I10 Essential (primary) hypertension: Secondary | ICD-10-CM | POA: Diagnosis not present

## 2015-05-10 DIAGNOSIS — Z23 Encounter for immunization: Secondary | ICD-10-CM | POA: Insufficient documentation

## 2015-05-10 DIAGNOSIS — Z136 Encounter for screening for cardiovascular disorders: Secondary | ICD-10-CM

## 2015-05-10 DIAGNOSIS — Z0000 Encounter for general adult medical examination without abnormal findings: Secondary | ICD-10-CM

## 2015-05-10 DIAGNOSIS — Z95810 Presence of automatic (implantable) cardiac defibrillator: Secondary | ICD-10-CM

## 2015-05-10 DIAGNOSIS — IMO0001 Reserved for inherently not codable concepts without codable children: Secondary | ICD-10-CM | POA: Insufficient documentation

## 2015-05-10 DIAGNOSIS — Z8679 Personal history of other diseases of the circulatory system: Secondary | ICD-10-CM | POA: Diagnosis not present

## 2015-05-10 NOTE — Progress Notes (Signed)
Name: [name]   MRN: [id number]    DOB: 08-05-1975   Date:05/10/2015       Progress Note  Subjective  Chief Complaint  Chief Complaint  Patient presents with  . Annual Exam    HPI  Patient is here today for a Complete Male Physical Exam:  [name] is a 39 year old male with know history of cardiomyopathy, HTN, aortic valve replacement with recent episode of Ventricular Tachycardia s/p defibrillator implant. Overall feels health needs are balanced. Has no complaints.   Echo March 2013 demonstrated left ventricular dysfunction with an ejection fraction of 40-45% with inferior posterior akinesis and aortic valve bioprosthesis. 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   Echo 12/14 demonstrated mild worsening of LV function 35-40%.with questions regarding aortic valve and possible stenosis with a bioprosthesis; TEE 1/15 was undertaken by Dr. Deidre Ala demonstrated that normal LV function no significant aortic stenosis with well visualized aortic valve opening.  He is medically treated with Coreg 25 mg a day along with Ramipril 5 mg in the evenings. Otherwise reports well controled moods on Prozac 20 mg a day.  The patient denies chest pain, shortness of breath, nocturnal dyspnea, orthopnea or peripheral edema. There have been no palpitations, lightheadedness or syncope.   Diet is well balanced. In general does exercise regularly. Sees dentist regularly and addresses vision concerns with ophthalmologist if applicable. In regards to sexual activity the patient is currently sexually active. Currently is not concerned about exposure to any STDs. Family history of prostate cancer in his grandfather, diagnosed in his 40s.    Past Medical History  Diagnosis Date  . Secondary cardiomyopathy ([place])   . Ventricular septal defect   . Aortic valve replaced     Requiring replacement, specifics  not available  . Syncope   . Ventricular tachycardia ([place])     appropriate VT shock therapy /13  . ICD (implantable cardiac defibrillator), BSX single   . Anxiety   . Hypertension     Past Surgical History  Procedure Laterality Date  . Vsd repair    . Aortic valve replacement      Bioprosthesis  . Angioplasty      RCA repair with vein angioplasty following injury with the aforementioned surgery  . Cardiac defibrillator placement    . Implantable cardioverter defibrillator generator change N/A 05/05/2013    Procedure: IMPLANTABLE CARDIOVERTER DEFIBRILLATOR GENERATOR CHANGE;  Surgeon: Deboraha Sprang, MD;  Location: [facility] CATH LAB;  Service: Cardiovascular;  Laterality: N/A;    Family History  Problem Relation Age of Onset  . Heart disease Father     Social History   Social History  . Marital Status: Married    Spouse Name: N/A  . Number of Children: N/A  . Years of Education: N/A   Occupational History  . Full time    Social History Main Topics  . Smoking status: Never Smoker   . Smokeless tobacco: Never Used  . Alcohol Use: No  . Drug Use: No  . Sexual Activity: Not on file   Other Topics Concern  . Not on file   Social History Narrative   Married     Current outpatient prescriptions:  .  carvedilol (COREG) 25 MG tablet, TAKE 1 TABLET BY MOUTH TWICE DAILY, Disp: 60 tablet, Rfl: 6 .  carvedilol (COREG) 25 MG tablet, TAKE 1 TABLET BY MOUTH TWICE DAILY WITH MEALS,  Disp: 60 tablet, Rfl: 10 .  FLUoxetine (PROZAC) 20 MG capsule, TAKE 1 CAPSULE BY MOUTH EVERY MORNING, Disp: 30 capsule, Rfl: 5 .  loratadine (CLARITIN) 5 MG chewable tablet, Chew 5 mg by mouth daily as needed for allergies., Disp: , Rfl:  .  omeprazole (PRILOSEC) 10 MG capsule, Take 10 mg by mouth 2 (two) times daily as needed (acid reflux). , Disp: , Rfl:  .  ramipril (ALTACE) 5 MG capsule, Take 2 capsules (10 mg total) by mouth at bedtime., Disp: 180 capsule, Rfl: 1  Allergies  Allergen Reactions  .  Shellfish Allergy Nausea And Vomiting    ROS  CONSTITUTIONAL: No significant weight changes, fever, chills, weakness or fatigue.  HEENT:  - Eyes: No visual changes.  - Ears: No auditory changes. No pain.  - Nose: No sneezing, congestion, runny nose. - Throat: No sore throat. No changes in swallowing. SKIN: No rash or itching.  CARDIOVASCULAR: No chest pain, chest pressure or chest discomfort. No palpitations or edema.  RESPIRATORY: No shortness of breath, cough or sputum.  GASTROINTESTINAL: No anorexia, nausea, vomiting. No changes in bowel habits. No abdominal pain or blood.  GENITOURINARY: No dysuria. No frequency. No discharge.  NEUROLOGICAL: No headache, dizziness, syncope, paralysis, ataxia, numbness or tingling in the extremities. No memory changes. No change in bowel or bladder control.  MUSCULOSKELETAL: No joint pain. No muscle pain. HEMATOLOGIC: No anemia, bleeding or bruising.  LYMPHATICS: No enlarged lymph nodes.  PSYCHIATRIC: No change in mood. No change in sleep pattern.  ENDOCRINOLOGIC: No reports of sweating, cold or heat intolerance. No polyuria or polydipsia.   Objective  Filed Vitals:   05/10/15 1411  Pulse: 78  Temp: 98 F (36.7 C)  TempSrc: Oral  Resp: 16  Weight: 212 lb 11.2 oz (96.48 kg)  SpO2: 96%   Body mass index is 31.4 kg/(m^2).  Depression screen [facility] 2/9 05/10/2015  Decreased Interest 0  Down, Depressed, Hopeless 0  PHQ - 2 Score 0      Recent Results (from the past 2160 hour(s))  Implantable device - remote     Status: None   Collection Time: 03/28/15  6:54 AM  Result Value Ref Range   Date Time Interrogation Session [id number]    Pulse Generator Manufacturer BOST    Pulse Gen Model E141 ENERGEN VR    Pulse Gen Serial Number [id number]    Implantable Pulse Generator Type Implantable Cardiac Defibulator    Implantable Pulse Generator Implant Date [id number]    Implantable Lead Manufacturer [facility]    Implantable Lead Model 0185     Implantable Lead Serial Number [id number]    Implantable Lead Implant Date [id number]    Implantable Lead Location [id number]    Lead Channel Setting Sensing Sensitivity 0.6 mV   Lead Channel Setting Sensing Adaptation Mode Adaptive Sensing    Lead Channel Setting Pacing Pulse Width 0.4 ms   Lead Channel Setting Pacing Amplitude 2.4 V   Lead Channel Impedance Value 589 ohm   HighPow Impedance 53 ohm   Battery Status BOS    Battery Remaining Longevity 144 mo   Battery Remaining Percentage 100 %   Brady Statistic RV Percent Paced 0 %   Eval Rhythm Vs     Physical Exam  Constitutional: Patient appears well-developed and well-nourished. In no distress.  HEENT:  - Head: Normocephalic and atraumatic.  - Ears: Bilateral TMs gray, no erythema or effusion - Nose: Nasal mucosa moist - Mouth/Throat: Oropharynx is clear and moist. No tonsillar  hypertrophy or erythema. No post nasal drainage.  - Eyes: Conjunctivae clear, EOM movements normal. PERRLA. No scleral icterus.  Neck: Normal range of motion. Neck supple. No JVD present. No thyromegaly present.  Cardiovascular: Normal rate, regular rhythm and normal heart sounds.  No murmur heard.  Pulmonary/Chest: Implanted device palpated over left pec muscle. Midline sternum scar well healed. Effort normal and breath sounds normal. No respiratory distress. Abdominal: Soft. Bowel sounds are normal, no distension. There is no tenderness. no masses BREAST: Bilateral breast exam normal with no masses, skin changes or nipple discharge MALE GENITALIA: Bilateral testes descended with no masses, no penile lesions, no penile discharge. PROSTATE: Normal prostate size and consistency. RECTAL: no rectal masses or hemorrhoids Musculoskeletal: Normal range of motion bilateral UE and LE, no joint effusions. Peripheral vascular: Bilateral LE no edema. Neurological: CN II-XII grossly intact with no focal deficits. Alert and oriented to person, place, and time. Coordination,  balance, strength, speech and gait are normal.  Skin: Skin is warm and dry. No rash noted. No erythema.  Psychiatric: Patient has a normal mood and affect. Behavior is normal in office today. Judgment and thought content normal in office today.    Assessment & Plan  1. Annual physical exam Discussed in detail all recommended preventative measures appropriate for age and gender now and in the future.  - CBC with Differential/Platelet - Comprehensive metabolic panel - Lipid panel - TSH - PSA  2. Hypertension goal BP (blood pressure) < 140/90 Well controled on current regimen.  - CBC with Differential/Platelet - Comprehensive metabolic panel - Lipid panel - TSH - PSA  3. Presence of cardiac defibrillator Stable.  - CBC with Differential/Platelet - Comprehensive metabolic panel - Lipid panel - TSH - PSA  4. History of ventricular tachycardia Resolved.  - CBC with Differential/Platelet - Comprehensive metabolic panel - Lipid panel - TSH - PSA  5. Influenza vaccination declined by patient   6. Encounter for cholesteral screening for cardiovascular disease  - Lipid panel  7. Encounter for screening for malignant neoplasm of prostate I discussed with the patient the risks, benefits and indications of PSA testing. He is aware that the test is a controversial test and that the guidelines vary therefore we are employing shared decision making in regards to testing serum PSA or not. He is aware that PSA testing may lead to over treatment, invasive procedures and over diagnosis of prostate cancer. He chooses to proceed with serum PSA testing.   - PSA  8. Need for diphtheria-tetanus-pertussis (Tdap) vaccine, adult/adolescent  - Tdap vaccine greater than or equal to 7yo IM

## 2015-05-11 LAB — CBC WITH DIFFERENTIAL/PLATELET
BASOS: 2 %
Basophils Absolute: 0.1 10*3/uL (ref 0.0–0.2)
EOS (ABSOLUTE): 0.4 10*3/uL (ref 0.0–0.4)
EOS: 10 %
HEMATOCRIT: 44.1 % (ref 37.5–51.0)
Hemoglobin: 14.6 g/dL (ref 12.6–17.7)
IMMATURE GRANULOCYTES: 0 %
Immature Grans (Abs): 0 10*3/uL (ref 0.0–0.1)
Lymphocytes Absolute: 1 10*3/uL (ref 0.7–3.1)
Lymphs: 24 %
MCH: 28.6 pg (ref 26.6–33.0)
MCHC: 33.1 g/dL (ref 31.5–35.7)
MCV: 86 fL (ref 79–97)
MONOCYTES: 13 %
Monocytes Absolute: 0.6 10*3/uL (ref 0.1–0.9)
NEUTROS PCT: 51 %
Neutrophils Absolute: 2.2 10*3/uL (ref 1.4–7.0)
Platelets: 163 10*3/uL (ref 150–379)
RBC: 5.11 x10E6/uL (ref 4.14–5.80)
RDW: 12.3 % (ref 12.3–15.4)
WBC: 4.3 10*3/uL (ref 3.4–10.8)

## 2015-05-12 LAB — COMPREHENSIVE METABOLIC PANEL
ALT: 36 IU/L (ref 0–44)
AST: 29 IU/L (ref 0–40)
Albumin/Globulin Ratio: 1.6 (ref 1.1–2.5)
Albumin: 4.4 g/dL (ref 3.5–5.5)
Alkaline Phosphatase: 34 IU/L — ABNORMAL LOW (ref 39–117)
BUN/Creatinine Ratio: 12 (ref 9–20)
BUN: 13 mg/dL (ref 6–24)
Bilirubin Total: 0.7 mg/dL (ref 0.0–1.2)
CALCIUM: 9.6 mg/dL (ref 8.7–10.2)
CO2: 26 mmol/L (ref 18–29)
CREATININE: 1.13 mg/dL (ref 0.76–1.27)
Chloride: 103 mmol/L (ref 96–106)
GFR, EST AFRICAN AMERICAN: 93 mL/min/{1.73_m2} (ref >59)
GFR, EST NON AFRICAN AMERICAN: 80 mL/min/{1.73_m2} (ref >59)
GLUCOSE: 99 mg/dL (ref 65–99)
Globulin, Total: 2.8 g/dL (ref 1.5–4.5)
Potassium: 4.5 mmol/L (ref 3.5–5.2)
Sodium: 143 mmol/L (ref 134–144)
TOTAL PROTEIN: 7.2 g/dL (ref 6.0–8.5)

## 2015-05-12 LAB — LIPID PANEL
CHOL/HDL RATIO: 4 ratio (ref 0.0–5.0)
Cholesterol, Total: 160 mg/dL (ref 100–199)
HDL: 40 mg/dL (ref >39)
LDL Calculated: 105 mg/dL — ABNORMAL HIGH (ref 0–99)
Triglycerides: 73 mg/dL (ref 0–149)
VLDL CHOLESTEROL CAL: 15 mg/dL (ref 5–40)

## 2015-05-12 LAB — PSA: Prostate Specific Ag, Serum: 1.1 ng/mL (ref 0.0–4.0)

## 2015-05-12 LAB — TSH: TSH: 2.3 u[IU]/mL (ref 0.450–4.500)

## 2015-05-25 ENCOUNTER — Telehealth: Payer: Self-pay

## 2015-05-27 NOTE — Telephone Encounter (Signed)
error 

## 2015-05-31 ENCOUNTER — Telehealth: Payer: Self-pay

## 2015-05-31 NOTE — Telephone Encounter (Signed)
Patient called Call a nurse 05/29/14 for shoulder pains and I called today to check on him.  He states still having pain, I told him we would get him scheduled to be seen by someone this week.

## 2015-07-07 ENCOUNTER — Encounter: Payer: Self-pay | Admitting: Internal Medicine

## 2015-07-07 ENCOUNTER — Ambulatory Visit (INDEPENDENT_AMBULATORY_CARE_PROVIDER_SITE_OTHER): Payer: 59 | Admitting: Internal Medicine

## 2015-07-07 VITALS — BP 150/100 | HR 61 | Ht 69.0 in | Wt 219.0 lb

## 2015-07-07 DIAGNOSIS — Z95810 Presence of automatic (implantable) cardiac defibrillator: Secondary | ICD-10-CM | POA: Diagnosis not present

## 2015-07-07 DIAGNOSIS — I472 Ventricular tachycardia: Secondary | ICD-10-CM

## 2015-07-07 DIAGNOSIS — I429 Cardiomyopathy, unspecified: Secondary | ICD-10-CM

## 2015-07-07 DIAGNOSIS — I4729 Other ventricular tachycardia: Secondary | ICD-10-CM

## 2015-07-07 LAB — CUP PACEART INCLINIC DEVICE CHECK
HIGH POWER IMPEDANCE MEASURED VALUE: 54 Ohm
Implantable Lead Model: 185
Implantable Lead Serial Number: 178017
Lead Channel Impedance Value: 608 Ohm
Lead Channel Pacing Threshold Amplitude: 1 V
Lead Channel Pacing Threshold Pulse Width: 0.4 ms
Lead Channel Setting Pacing Amplitude: 2.4 V
MDC IDC LEAD IMPLANT DT: 20080214
MDC IDC LEAD LOCATION: 753860
MDC IDC MSMT LEADCHNL RV SENSING INTR AMPL: 21.6 mV
MDC IDC PG SERIAL: 126781
MDC IDC SESS DTM: 20170216050000
MDC IDC SET LEADCHNL RV PACING PULSEWIDTH: 0.4 ms
MDC IDC SET LEADCHNL RV SENSING SENSITIVITY: 0.6 mV

## 2015-07-07 NOTE — Progress Notes (Signed)
Patient Care Team: Bobetta Lime, MD as PCP - General (Family Medicine)   HPI  [name] is a 40 y.o. male Seen in followup for ventricular tachycardia occurring in the context of surgically corrected congenital heart disease.Marland Kitchen He is status post ICD implantation. 4/13 had appropriate shock for fast ventricular tachycardia He underwent device generator replacement 12/14    Echo March 2013 demonstrated left ventricular dysfunction with an ejection fraction of 40-45% with inferior posterior akinesis and aortic valve bioprosthesis. 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  Echo 12/14 demonstrated mild worsening of LV function 35-40%.with questions regarding aortic valve and possible stenosis with a bioprosthesis;  TEE 1/15 was undertaken by Dr. Deidre Ala demonstrated that normal LV function no significant aortic stenosis with well visualized aortic valve opening.    The patient denies chest pain, shortness of breath, nocturnal dyspnea, orthopnea or peripheral edema.  There have been no palpitations, lightheadedness or syncope.   The patient had episodes of chest pain associated with gas are sharp briefly lived. He notes no chest discomfort with exertion.      Past Medical History  Diagnosis Date  . Secondary cardiomyopathy ([place])   . Ventricular septal defect   . Aortic valve replaced     Requiring replacement, specifics not available  . Syncope   . Ventricular tachycardia ([place])     appropriate VT shock therapy /13  . ICD (implantable cardiac defibrillator), BSX single   . Anxiety   . Hypertension     Past Surgical History  Procedure Laterality Date  . Vsd repair    . Aortic valve replacement      Bioprosthesis  . Angioplasty      RCA repair with vein angioplasty following injury with the aforementioned surgery  . Cardiac defibrillator placement    . Implantable  cardioverter defibrillator generator change N/A 05/05/2013    Procedure: IMPLANTABLE CARDIOVERTER DEFIBRILLATOR GENERATOR CHANGE;  Surgeon: Deboraha Sprang, MD;  Location: [facility] CATH LAB;  Service: Cardiovascular;  Laterality: N/A;    Current Outpatient Prescriptions  Medication Sig Dispense Refill  . carvedilol (COREG) 25 MG tablet TAKE 1 TABLET BY MOUTH TWICE DAILY WITH MEALS 60 tablet 10  . FLUoxetine (PROZAC) 20 MG capsule TAKE 1 CAPSULE BY MOUTH EVERY MORNING 30 capsule 5  . loratadine (CLARITIN) 5 MG chewable tablet Chew 5 mg by mouth daily as needed for allergies.    . ramipril (ALTACE) 10 MG capsule Take 10 mg by mouth daily.     No current facility-administered medications for this visit.    Allergies  Allergen Reactions  . Shellfish Allergy Nausea And Vomiting    Review of Systems negative except from HPI and PMH  Physical Exam BP 150/100 mmHg  Pulse 61  Ht 5\' 9"  (1.753 m)  Wt 219 lb (99.338 kg)  BMI 32.33 kg/m2 Well developed and well nourished in no acute distress HENT normal E scleral and icterus clear Neck Supple JVP flat; carotids brisk and full Clear to ausculation  Device pocket well healed; without hematoma or erythema.  There is no tethering Regular rate and rhythm, 2/6 murmur Soft with active bowel sounds No clubbing cyanosis no  Edema Alert and oriented, grossly normal motor and sensory function Skin Warm and Dry  ECG demonstrates sinus rhythm at 65 Intervals 18/11/41 LVH with repolarization  Assessment and plan  Ventricular tachycardia  some recurrent nonsustained ventricular  tachycardia  Hypertension   Chest pain  Aortic valve-bioprosthesis   ICD-[employer]  The patient's device was interrogated.  The information was reviewed. No changes were made in the programming.      Cardiomyopathy-secondary     We will plan to repeat his echocardiogram given the interval change in nonsustained ventricular tachycardia burden. It is noteworthy  that he had significant discordance between his TEE and TTE when done 2 years ago. Hopefully, the use of contrast will solve this for [place].  His chest pain seems to be atypical been associated with gas relieved with eructation

## 2015-07-07 NOTE — Patient Instructions (Addendum)
Medication Instructions: - Your physician recommends that you continue on your current medications as directed. Please refer to the Current Medication list given to you today.  Labwork: - none  Procedures/Testing: - Your physician has requested that you have an echocardiogram- [place]. Echocardiography is a painless test that uses sound waves to create images of your heart. It provides your doctor with information about the size and shape of your heart and how well your heart's chambers and valves are working. This procedure takes approximately one hour. There are no restrictions for this procedure.  Follow-Up: - Remote monitoring is used to monitor your Pacemaker of ICD from home. This monitoring reduces the number of office visits required to check your device to one time per year. It allows [place] to keep an eye on the functioning of your device to ensure it is working properly. You are scheduled for a device check from home on 10/06/15. You may send your transmission at any time that day. If you have a wireless device, the transmission will be sent automatically. After your physician reviews your transmission, you will receive a postcard with your next transmission date.  - Your physician wants you to follow-up in: 1 year with Dr. Caryl Comes. You will receive a reminder letter in the mail two months in advance. If you don't receive a letter, please call our office to schedule the follow-up appointment.  Any Additional Special Instructions Will Be Listed Below (If Applicable).     If you need a refill on your cardiac medications before your next appointment, please call your pharmacy.

## 2015-07-19 ENCOUNTER — Other Ambulatory Visit: Payer: Self-pay

## 2015-07-19 ENCOUNTER — Ambulatory Visit (INDEPENDENT_AMBULATORY_CARE_PROVIDER_SITE_OTHER): Payer: 59

## 2015-07-19 DIAGNOSIS — Z95810 Presence of automatic (implantable) cardiac defibrillator: Secondary | ICD-10-CM | POA: Diagnosis not present

## 2015-07-19 DIAGNOSIS — I4729 Other ventricular tachycardia: Secondary | ICD-10-CM

## 2015-07-19 DIAGNOSIS — I472 Ventricular tachycardia: Secondary | ICD-10-CM

## 2015-07-19 DIAGNOSIS — I429 Cardiomyopathy, unspecified: Secondary | ICD-10-CM | POA: Diagnosis not present

## 2015-07-26 ENCOUNTER — Encounter: Payer: 59 | Admitting: Internal Medicine

## 2015-07-29 ENCOUNTER — Ambulatory Visit (INDEPENDENT_AMBULATORY_CARE_PROVIDER_SITE_OTHER): Payer: 59 | Admitting: Family Medicine

## 2015-07-29 ENCOUNTER — Encounter: Payer: Self-pay | Admitting: Family Medicine

## 2015-07-29 VITALS — BP 120/80 | HR 81 | Temp 98.0°F | Resp 14 | Ht 69.0 in | Wt 218.0 lb

## 2015-07-29 DIAGNOSIS — J069 Acute upper respiratory infection, unspecified: Secondary | ICD-10-CM | POA: Diagnosis not present

## 2015-07-29 DIAGNOSIS — B9789 Other viral agents as the cause of diseases classified elsewhere: Secondary | ICD-10-CM

## 2015-07-29 DIAGNOSIS — R1031 Right lower quadrant pain: Secondary | ICD-10-CM

## 2015-07-29 DIAGNOSIS — R1032 Left lower quadrant pain: Secondary | ICD-10-CM | POA: Diagnosis not present

## 2015-07-29 DIAGNOSIS — Z113 Encounter for screening for infections with a predominantly sexual mode of transmission: Secondary | ICD-10-CM | POA: Insufficient documentation

## 2015-07-29 DIAGNOSIS — R1030 Lower abdominal pain, unspecified: Secondary | ICD-10-CM | POA: Insufficient documentation

## 2015-07-29 MED ORDER — CIPROFLOXACIN HCL 500 MG PO TABS: 500.0000 mg | ORAL_TABLET | Freq: Two times a day (BID) | ORAL | Status: DC

## 2015-07-29 MED ORDER — IBUPROFEN 800 MG PO TABS: 800.0000 mg | ORAL_TABLET | Freq: Three times a day (TID) | ORAL | Status: DC | PRN

## 2015-07-29 NOTE — Progress Notes (Signed)
Name: [name]   MRN: [id number]    DOB: 1975-05-30   Date:07/29/2015       Progress Note  Subjective  Chief Complaint  Chief Complaint  Patient presents with  . Groin Pain    onset 1.5 weeks, Patient states both sides unknown trauma.  Left side has a knot, no problems with testicals.  Pain comes and goes.    HPI  [name] is a 40 year old male who complains of bilateral groin pain and left is worse than right and he feels a knot in the left groin area. Denies unusually exercise activity or trauma. Denies dysuria, penile discharge, pelvic pain, scrotal pain, scrotal mass. Also complains of respiratory cough and congestion without fevers.    Past Medical History  Diagnosis Date  . Secondary cardiomyopathy ([place])   . Ventricular septal defect   . Aortic valve replaced     Requiring replacement, specifics not available  . Syncope   . Ventricular tachycardia ([place])     appropriate VT shock therapy /13  . ICD (implantable cardiac defibrillator), BSX single   . Anxiety   . Hypertension     Patient Active Problem List   Diagnosis Date Noted  . Groin pain 07/29/2015  . Screening for STD (sexually transmitted disease) 07/29/2015  . Presence of cardiac defibrillator 05/10/2015  . Influenza vaccination declined by patient 05/10/2015  . Need for diphtheria-tetanus-pertussis (Tdap) vaccine, adult/adolescent 05/10/2015  . Hypertension goal BP (blood pressure) < 140/90 12/13/2011  . H/O aortic valve replacement 08/08/2009  . Cardiomyopathy, secondary ([place]) 03/09/2009  . History of ventricular tachycardia 03/09/2009    Social History  Substance Use Topics  . Smoking status: Never Smoker   . Smokeless tobacco: Never Used  . Alcohol Use: No     Current outpatient prescriptions:  .  carvedilol (COREG) 25 MG tablet, TAKE 1 TABLET BY MOUTH TWICE DAILY WITH MEALS, Disp: 60 tablet, Rfl: 10 .  ciprofloxacin (CIPRO) 500 MG tablet, Take 1 tablet (500 mg total) by mouth 2 (two)  times daily., Disp: 20 tablet, Rfl: 0 .  FLUoxetine (PROZAC) 20 MG capsule, TAKE 1 CAPSULE BY MOUTH EVERY MORNING, Disp: 30 capsule, Rfl: 5 .  ibuprofen (ADVIL,MOTRIN) 800 MG tablet, Take 1 tablet (800 mg total) by mouth every 8 (eight) hours as needed., Disp: 50 tablet, Rfl: 1 .  loratadine (CLARITIN) 5 MG chewable tablet, Chew 5 mg by mouth daily as needed for allergies., Disp: , Rfl:  .  ramipril (ALTACE) 10 MG capsule, Take 10 mg by mouth daily., Disp: , Rfl:   Allergies  Allergen Reactions  . Shellfish Allergy Nausea And Vomiting    Review of Systems  Positive for bilateral groin pain, cough as mentioned in HPI, otherwise all systems reviewed and are negative.  Objective  BP 120/80 mmHg  Pulse 81  Temp(Src) 98 F (36.7 C) (Oral)  Resp 14  Ht 5\' 9"  (1.753 m)  Wt 218 lb (98.884 kg)  BMI 32.18 kg/m2  SpO2 97%  Body mass index is 32.18 kg/(m^2).   Physical Exam  Constitutional: Patient remains overweight and well-nourished. In no distress.  HEENT:  - Head: Normocephalic and atraumatic.  - Ears: Bilateral TMs gray, no erythema or effusion - Nose: Nasal mucosa moist - Mouth/Throat: Oropharynx is clear and moist. No tonsillar hypertrophy or erythema. No post nasal drainage.  - Eyes: Conjunctivae clear, EOM movements normal. PERRLA. No scleral icterus.  Neck: Normal range of motion. Neck supple. No JVD present. No  thyromegaly present.  Cardiovascular: Normal rate, regular rhythm and normal heart sounds.  No murmur heard.  Pulmonary/Chest: Effort normal and breath sounds normal. No respiratory distress. Musculoskeletal: Normal range of motion bilateral UE and LE, no joint effusions.  Genital area: normal bilateral descended testes with no masses, penis with no discharge or lesions, bilateral crus of legs with no rash or masses although left side does have soft prominent lymph node.   Psychiatric: Patient has a normal mood and affect. Behavior is normal in office today.  Judgment and thought content normal in office today.    Assessment & Plan  1. Groin pain, right Likely muscular strain but due to prominent lymph node I will get urine culture and std testing. Prophylactic Cipro. If symptoms persist despite this may need pelvic imaging.  - ciprofloxacin (CIPRO) 500 MG tablet; Take 1 tablet (500 mg total) by mouth 2 (two) times daily.  Dispense: 20 tablet; Refill: 0 - ibuprofen (ADVIL,MOTRIN) 800 MG tablet; Take 1 tablet (800 mg total) by mouth every 8 (eight) hours as needed.  Dispense: 50 tablet; Refill: 1 - Urine Culture - Chlamydia/Gonococcus/Trichomonas, NAA  2. Groin pain, left  - ciprofloxacin (CIPRO) 500 MG tablet; Take 1 tablet (500 mg total) by mouth 2 (two) times daily.  Dispense: 20 tablet; Refill: 0 - ibuprofen (ADVIL,MOTRIN) 800 MG tablet; Take 1 tablet (800 mg total) by mouth every 8 (eight) hours as needed.  Dispense: 50 tablet; Refill: 1 - Urine Culture - Chlamydia/Gonococcus/Trichomonas, NAA  3. Screening for STD (sexually transmitted disease)  - Chlamydia/Gonococcus/Trichomonas, NAA  4. Viral URI with cough Symptomatic treatment

## 2015-07-29 NOTE — Patient Instructions (Signed)
Groin Strain  A groin strain (also called a groin pull) is an injury to the muscles or tendon on the upper inner part of the thigh. These muscles are called the adductor muscles or groin muscles. They are responsible for moving the leg across the body. A muscle strain occurs when a muscle is overstretched and some muscle fibers are torn. A groin strain can range from mild to severe depending on how many muscle fibers are affected and whether the muscle fibers are partially or completely torn.   Groin strains usually occur during exercise or participation in sports. The injury often happens when a sudden, violent force is placed on a muscle, stretching the muscle too far. A strain is more likely to occur when your muscles are not warmed up or if you are not properly conditioned. Depending on the severity of the groin strain, recovery time may vary from a few weeks to several weeks. Severe injuries often require 4-6 weeks for recovery. In these cases, complete healing can take 4-5 months.   CAUSES    Stretching the groin muscles too far or too suddenly, often during side-to-side motion with an abrupt change in direction.   Putting repeated stress on the groin muscles over a long period of time.   Performing vigorous activity without properly stretching the groin muscles beforehand.  SYMPTOMS    Pain and tenderness in the groin area. This begins as sharp pain and persists as a dull ache.   Popping or snapping feeling when the injury occurs (for severe strains).   Swelling or bruising.   Muscle spasms.   Weakness in the leg.   Stiffness in the groin area with decreased ability to move the affected muscles.  DIAGNOSIS   Your caregiver will perform a physical exam to diagnose a groin strain. You will be asked about your symptoms and how the injury occurred. X-rays are sometimes needed to rule out a broken bone or cartilage problems. Your caregiver may order a CT scan or MRI if a complete muscle tear is  suspected.  TREATMENT   A groin strain will often heal on its own. Your caregiver may prescribe medicines to help manage pain and swelling (anti-inflammatory medicine). You may be told to use crutches for the first few days to minimize your pain.  HOME CARE INSTRUCTIONS    Rest. Do not use the strained muscle if it causes pain.   Put ice on the injured area.   Put ice in a plastic bag.   Place a towel between your skin and the bag.   Leave the ice on for 15-20 minutes, every 2-3 hours. Do this for the first 2 days after the injury.   Only take over-the-counter or prescription medicines as directed by your caregiver.   Wrap the injured area with an elastic bandage as directed by your caregiver.   Keep the injured leg raised (elevated).   Walk, stretch, and perform range-of-motion exercises to improve blood flow to the injured area. Only perform these activities if you can do so without any pain.  To prevent muscle strains:   Warm up before exercise.   Develop proper conditioning and strength in the groin muscles.  SEEK IMMEDIATE MEDICAL CARE IF:    You have increased pain or swelling in the affected area.    Your symptoms are not improving or are getting worse.  MAKE SURE YOU:    Understand these instructions.   Will watch your condition.   Will get help   right away if you are not doing well or get worse.     This information is not intended to replace advice given to you by your health care provider. Make sure you discuss any questions you have with your health care provider.     Document Released: 01/03/2004 Document Revised: 04/23/2012 Document Reviewed: 01/09/2012  Elsevier Interactive Patient Education 2016 [employer]

## 2015-08-02 LAB — URINE CULTURE: ORGANISM ID, BACTERIA: NO GROWTH

## 2015-08-03 LAB — CHLAMYDIA/GONOCOCCUS/TRICHOMONAS, NAA
CHLAMYDIA BY NAA: NEGATIVE
Gonococcus by NAA: NEGATIVE
Trich vag by NAA: NEGATIVE

## 2015-08-15 ENCOUNTER — Other Ambulatory Visit: Payer: Self-pay

## 2015-08-17 ENCOUNTER — Other Ambulatory Visit: Payer: Self-pay

## 2015-08-17 MED ORDER — FLUTICASONE PROPIONATE 50 MCG/ACT NA SUSP: 2.0000 | Freq: Every day | NASAL | Status: DC

## 2015-08-31 ENCOUNTER — Other Ambulatory Visit: Payer: Self-pay

## 2015-08-31 MED ORDER — FLUOXETINE HCL 20 MG PO CAPS: 20.0000 mg | ORAL_CAPSULE | Freq: Every morning | ORAL | Status: DC

## 2015-09-03 ENCOUNTER — Telehealth: Payer: Self-pay | Admitting: Physician Assistant

## 2015-09-03 MED ORDER — CARVEDILOL 25 MG PO TABS: 25.0000 mg | ORAL_TABLET | Freq: Two times a day (BID) | ORAL | Status: DC

## 2015-09-03 NOTE — Telephone Encounter (Signed)
Paged by answering service, the patient has list pill of coreg tonight, will give refill. 90 days supplies.    [name], [place]

## 2015-09-15 ENCOUNTER — Encounter: Payer: Self-pay | Admitting: Family Medicine

## 2015-09-15 ENCOUNTER — Ambulatory Visit (INDEPENDENT_AMBULATORY_CARE_PROVIDER_SITE_OTHER): Payer: 59 | Admitting: Family Medicine

## 2015-09-15 VITALS — BP 110/70 | HR 90 | Temp 98.4°F | Resp 18 | Ht 69.0 in | Wt 219.2 lb

## 2015-09-15 DIAGNOSIS — R1032 Left lower quadrant pain: Secondary | ICD-10-CM | POA: Diagnosis not present

## 2015-09-15 DIAGNOSIS — R197 Diarrhea, unspecified: Secondary | ICD-10-CM | POA: Diagnosis not present

## 2015-09-15 DIAGNOSIS — I10 Essential (primary) hypertension: Secondary | ICD-10-CM | POA: Diagnosis not present

## 2015-09-15 DIAGNOSIS — R110 Nausea: Secondary | ICD-10-CM

## 2015-09-15 DIAGNOSIS — M545 Low back pain, unspecified: Secondary | ICD-10-CM | POA: Insufficient documentation

## 2015-09-15 DIAGNOSIS — R140 Abdominal distension (gaseous): Secondary | ICD-10-CM

## 2015-09-15 DIAGNOSIS — R61 Generalized hyperhidrosis: Secondary | ICD-10-CM | POA: Insufficient documentation

## 2015-09-15 NOTE — Patient Instructions (Signed)
We'll get labs today We'll get scan tomorrow If your symptoms worsen, please do go to the ER Call me with any changes

## 2015-09-15 NOTE — Progress Notes (Signed)
BP 110/70 mmHg  Pulse 90  Temp(Src) 98.4 F (36.9 C) (Oral)  Resp 18  Ht 5\' 9"  (1.753 m)  Wt 219 lb 3.2 oz (99.428 kg)  BMI 32.36 kg/m2  SpO2 96%   Subjective:    Patient ID: [name], male    DOB: [birth year], 40 y.o.   MRN: [id number]  HPI: [name] is a 40 y.o. male  Chief Complaint  Patient presents with  . Back Pain    for 1 month  . Spasms    right arm for 2 weeks   Patient c/o back pain, going on for about a month Squatting is out of the picture Gets aggravated through the day; intense at times when squatting Came out of nowhere Going on for one month; pretty constant in terms of intensity Can come and go though No rash Nothing similar No back injuries He has tried massages, thinking it might be muscular; two sessions; helps for a 1-2 days but then comes back He did try heat and that did not help When the pain is there, it can be clenching No weakness in the legs No shooting pains, no B/B dysfunction No blood in the urine or stool  Had groin pain about a month ago; got tested for STDs, but he doesn't know why it happened; still has lump in the groin left side; no fevers; little bit of night sweats; diarrhea for 2 days, weight up and down; appetite has not been great  Depression screen [facility] 2/9 09/15/2015 07/29/2015 05/10/2015  Decreased Interest 0 0 0  Down, Depressed, Hopeless 0 0 0  PHQ - 2 Score 0 0 0   Relevant past medical, surgical, family and social history reviewed and updated as indicated.  Past Medical History  Diagnosis Date  . Secondary cardiomyopathy ([place])   . Ventricular septal defect   . Aortic valve replaced     Requiring replacement, specifics not available  . Syncope   . Ventricular tachycardia ([place])     appropriate VT shock therapy /13  . ICD (implantable cardiac defibrillator), BSX single   . Anxiety   . Hypertension   . IBD (inflammatory bowel disease) 09/23/2015   Past Surgical History  Procedure Laterality Date  .  Vsd repair    . Aortic valve replacement      Bioprosthesis  . Angioplasty      RCA repair with vein angioplasty following injury with the aforementioned surgery  . Cardiac defibrillator placement    . Implantable cardioverter defibrillator generator change N/A 05/05/2013    Procedure: IMPLANTABLE CARDIOVERTER DEFIBRILLATOR GENERATOR CHANGE;  Surgeon: Deboraha Sprang, MD;  Location: [facility] CATH LAB;  Service: Cardiovascular;  Laterality: N/A;   Family History  Problem Relation Age of Onset  . Heart disease Father   same condition as patient Aunt battling kidney disease  Social History  Substance Use Topics  . Smoking status: Never Smoker   . Smokeless tobacco: Never Used  . Alcohol Use: No   Interim medical history since our last visit reviewed. Allergies and medications reviewed and updated.  Review of Systems Per HPI unless specifically indicated above     Objective:    BP 110/70 mmHg  Pulse 90  Temp(Src) 98.4 F (36.9 C) (Oral)  Resp 18  Ht 5\' 9"  (1.753 m)  Wt 219 lb 3.2 oz (99.428 kg)  BMI 32.36 kg/m2  SpO2 96%  Wt Readings from Last 3 Encounters:  09/15/15 219 lb 3.2 oz (99.428 kg)  07/29/15 218 lb (98.884 kg)  07/07/15 219 lb (99.338 kg)    Physical Exam  Constitutional: He appears well-developed and well-nourished. No distress.  HENT:  Head: Normocephalic and atraumatic.  Eyes: EOM are normal. No scleral icterus.  Neck: No thyromegaly present.  Cardiovascular: Normal rate and regular rhythm.   Pulmonary/Chest: Effort normal and breath sounds normal.  Abdominal: Soft. Bowel sounds are normal. He exhibits no distension. There is no tenderness.  Musculoskeletal: He exhibits no edema.  Lymphadenopathy:       Right: No inguinal adenopathy present.       Left: No inguinal adenopathy present.  Neurological: Coordination normal.  Skin: Skin is warm and dry. No pallor.  Psychiatric: He has a normal mood and affect. His behavior is normal. Judgment and thought  content normal.      Assessment & Plan:   Problem List Items Addressed This Visit      Cardiovascular and Mediastinum   Hypertension goal BP (blood pressure) < 140/90    Controlled today        Other   Diarrhea    With back pain, abd bloating, getting abd CT scan and labs      Relevant Orders   CT Abdomen Pelvis W Contrast (Completed)   Groin pain    Scan pelvis      Relevant Orders   UA/M w/rflx Culture, Routine (Completed)   CT Abdomen Pelvis W Contrast (Completed)   Nausea   Relevant Orders   CT Abdomen Pelvis W Contrast (Completed)   Night sweats    Will get LDH and CT scan to r/o lyphoma, malignancy      Relevant Orders   Lactate Dehydrogenase (LDH) (Completed)   Comprehensive metabolic panel (Completed)   CT Abdomen Pelvis W Contrast (Completed)   Pain in lower back - Primary    Check urine to r/o blood; not typical for musculoskeletal disease; will be getting CT scan to look for intra-abdominal, retroperitoneal pathology      Relevant Orders   CBC with Differential/Platelet (Completed)   UA/M w/rflx Culture, Routine (Completed)   CT Abdomen Pelvis W Contrast (Completed)    Other Visit Diagnoses    Abdominal bloating        Relevant Orders    Comprehensive metabolic panel (Completed)    CT Abdomen Pelvis W Contrast (Completed)       Follow up plan: No Follow-up on file.  An after-visit summary was printed and given to the patient at [place].  Please see the patient instructions which may contain other information and recommendations beyond what is mentioned above in the assessment and plan.  Orders Placed This Encounter  Procedures  . Microscopic Examination  . CT Abdomen Pelvis W Contrast  . CBC with Differential/Platelet  . Lactate Dehydrogenase (LDH)  . Comprehensive metabolic panel  . UA/M w/rflx Culture, Routine

## 2015-09-15 NOTE — Assessment & Plan Note (Signed)
Scan pelvis

## 2015-09-16 LAB — CBC WITH DIFFERENTIAL/PLATELET
BASOS: 2 %
Basophils Absolute: 0.1 10*3/uL (ref 0.0–0.2)
EOS (ABSOLUTE): 0.3 10*3/uL (ref 0.0–0.4)
Eos: 6 %
HEMOGLOBIN: 14.6 g/dL (ref 12.6–17.7)
Hematocrit: 44.2 % (ref 37.5–51.0)
IMMATURE GRANS (ABS): 0 10*3/uL (ref 0.0–0.1)
Immature Granulocytes: 0 %
Lymphocytes Absolute: 1.5 10*3/uL (ref 0.7–3.1)
Lymphs: 30 %
MCH: 28.3 pg (ref 26.6–33.0)
MCHC: 33 g/dL (ref 31.5–35.7)
MCV: 86 fL (ref 79–97)
MONOS ABS: 0.8 10*3/uL (ref 0.1–0.9)
Monocytes: 15 %
NEUTROS ABS: 2.3 10*3/uL (ref 1.4–7.0)
Neutrophils: 47 %
PLATELETS: 173 10*3/uL (ref 150–379)
RBC: 5.15 x10E6/uL (ref 4.14–5.80)
RDW: 12.9 % (ref 12.3–15.4)
WBC: 4.9 10*3/uL (ref 3.4–10.8)

## 2015-09-16 LAB — COMPREHENSIVE METABOLIC PANEL
A/G RATIO: 1.4 (ref 1.2–2.2)
ALT: 28 IU/L (ref 0–44)
AST: 27 IU/L (ref 0–40)
Albumin: 4.4 g/dL (ref 3.5–5.5)
Alkaline Phosphatase: 36 IU/L — ABNORMAL LOW (ref 39–117)
BUN/Creatinine Ratio: 13 (ref 9–20)
BUN: 14 mg/dL (ref 6–24)
Bilirubin Total: 0.6 mg/dL (ref 0.0–1.2)
CALCIUM: 9.5 mg/dL (ref 8.7–10.2)
CO2: 25 mmol/L (ref 18–29)
CREATININE: 1.08 mg/dL (ref 0.76–1.27)
Chloride: 99 mmol/L (ref 96–106)
GFR, EST AFRICAN AMERICAN: 98 mL/min/{1.73_m2} (ref >59)
GFR, EST NON AFRICAN AMERICAN: 85 mL/min/{1.73_m2} (ref >59)
Globulin, Total: 3.1 g/dL (ref 1.5–4.5)
Glucose: 97 mg/dL (ref 65–99)
POTASSIUM: 4.2 mmol/L (ref 3.5–5.2)
Sodium: 143 mmol/L (ref 134–144)
TOTAL PROTEIN: 7.5 g/dL (ref 6.0–8.5)

## 2015-09-16 LAB — UA/M W/RFLX CULTURE, ROUTINE
BILIRUBIN UA: NEGATIVE
GLUCOSE, UA: NEGATIVE
KETONES UA: NEGATIVE
Leukocytes, UA: NEGATIVE
NITRITE UA: NEGATIVE
Protein, UA: NEGATIVE
RBC UA: NEGATIVE
Specific Gravity, UA: 1.024 (ref 1.005–1.030)
UUROB: 1 mg/dL (ref 0.2–1.0)
pH, UA: 6 (ref 5.0–7.5)

## 2015-09-16 LAB — MICROSCOPIC EXAMINATION
BACTERIA UA: NONE SEEN
Casts: NONE SEEN /lpf

## 2015-09-16 LAB — LACTATE DEHYDROGENASE: LDH: 195 IU/L (ref 121–224)

## 2015-09-23 ENCOUNTER — Telehealth: Payer: Self-pay | Admitting: Family Medicine

## 2015-09-23 ENCOUNTER — Encounter: Payer: Self-pay | Admitting: Family Medicine

## 2015-09-23 ENCOUNTER — Ambulatory Visit
Admission: RE | Admit: 2015-09-23 | Discharge: 2015-09-23 | Disposition: A | Discharge status: Home / Self Care | Payer: 59 | Source: Ambulatory Visit | Attending: Family Medicine | Admitting: Family Medicine

## 2015-09-23 ENCOUNTER — Ambulatory Visit: Admission: RE | Admit: 2015-09-23 | Payer: 59 | Source: Ambulatory Visit

## 2015-09-23 DIAGNOSIS — R140 Abdominal distension (gaseous): Secondary | ICD-10-CM | POA: Insufficient documentation

## 2015-09-23 DIAGNOSIS — R1032 Left lower quadrant pain: Secondary | ICD-10-CM | POA: Diagnosis not present

## 2015-09-23 DIAGNOSIS — R61 Generalized hyperhidrosis: Secondary | ICD-10-CM | POA: Insufficient documentation

## 2015-09-23 DIAGNOSIS — A09 Infectious gastroenteritis and colitis, unspecified: Secondary | ICD-10-CM | POA: Diagnosis not present

## 2015-09-23 DIAGNOSIS — R197 Diarrhea, unspecified: Secondary | ICD-10-CM | POA: Diagnosis present

## 2015-09-23 DIAGNOSIS — R110 Nausea: Secondary | ICD-10-CM | POA: Insufficient documentation

## 2015-09-23 DIAGNOSIS — K529 Noninfective gastroenteritis and colitis, unspecified: Secondary | ICD-10-CM

## 2015-09-23 DIAGNOSIS — M545 Low back pain: Secondary | ICD-10-CM | POA: Diagnosis present

## 2015-09-23 DIAGNOSIS — A047 Enterocolitis due to Clostridium difficile: Secondary | ICD-10-CM | POA: Insufficient documentation

## 2015-09-23 HISTORY — DX: Noninfective gastroenteritis and colitis, unspecified: K52.9

## 2015-09-23 MED ORDER — METRONIDAZOLE 500 MG PO TABS: 500.0000 mg | ORAL_TABLET | Freq: Three times a day (TID) | ORAL | Status: DC

## 2015-09-23 MED ORDER — IOPAMIDOL (ISOVUE-370) INJECTION 76%
100.0000 mL | Freq: Once | INTRAVENOUS | Status: AC | PRN
  Administered 2015-09-23: 100 mL via INTRAVENOUS

## 2015-09-23 MED ORDER — CIPROFLOXACIN HCL 500 MG PO TABS: 500.0000 mg | ORAL_TABLET | Freq: Two times a day (BID) | ORAL | Status: DC

## 2015-09-23 NOTE — Telephone Encounter (Signed)
I spoke with patient; he is not acutely ill; no fever; he is planning to go out of town for a conference next week, and cannot have the colonoscopy done until he returns Advised risk of illness, bowel perforation, reasons to start antibiotics/go to ER Colonoscopy with Dr. Allen Norris when he returns the following week

## 2015-09-23 NOTE — Telephone Encounter (Signed)
CT scan results reviewed; please get Dr. Allen Norris on the phone for me if you would; I'd like to ask him to look at the CT scan and see if he can see this patient next week

## 2015-09-23 NOTE — Telephone Encounter (Signed)
Already spoke with dr. Durwin Reges

## 2015-09-23 NOTE — Telephone Encounter (Signed)
Flagyl and cipro if sick Refer to Dr. Allen Norris for colonoscopy; tell Ginger we spoke to him

## 2015-10-06 ENCOUNTER — Encounter: Payer: 59 | Admitting: *Deleted

## 2015-10-07 ENCOUNTER — Encounter: Payer: Self-pay | Admitting: Cardiology

## 2015-10-09 NOTE — Assessment & Plan Note (Signed)
Will get LDH and CT scan to r/o lyphoma, malignancy

## 2015-10-09 NOTE — Assessment & Plan Note (Signed)
Check urine to r/o blood; not typical for musculoskeletal disease; will be getting CT scan to look for intra-abdominal, retroperitoneal pathology

## 2015-10-09 NOTE — Assessment & Plan Note (Signed)
With back pain, abd bloating, getting abd CT scan and labs

## 2015-10-09 NOTE — Assessment & Plan Note (Signed)
Controlled today 

## 2015-10-26 ENCOUNTER — Telehealth: Payer: Self-pay

## 2015-10-26 ENCOUNTER — Other Ambulatory Visit: Payer: Self-pay

## 2015-10-26 NOTE — Telephone Encounter (Signed)
Gastroenterology Pre-Procedure Review  Request Date: 12/06/15 Requesting Physician: Dr. Sanda Klein  PATIENT REVIEW QUESTIONS: The patient responded to the following health history questions as indicated:    1. Are you having any GI issues? yes (IBS symptoms, diarrhea, bloating) 2. Do you have a personal history of Polyps? no 3. Do you have a family history of Colon Cancer or Polyps? no 4. Diabetes Mellitus? no 5. Joint replacements in the past 12 months?no 6. Major health problems in the past 3 months?no 7. Any artificial heart valves, MVP, or defibrillator?yes (Pacemaker and defibrillator)    MEDICATIONS & ALLERGIES:    Patient reports the following regarding taking any anticoagulation/antiplatelet therapy:   Plavix, Coumadin, Eliquis, Xarelto, Lovenox, Pradaxa, Brilinta, or Effient? no Aspirin? no  Patient confirms/reports the following medications:  Current Outpatient Prescriptions  Medication Sig Dispense Refill  . carvedilol (COREG) 25 MG tablet Take 1 tablet (25 mg total) by mouth 2 (two) times daily with a meal. 180 tablet 3  . ciprofloxacin (CIPRO) 500 MG tablet Take 1 tablet (500 mg total) by mouth 2 (two) times daily. 20 tablet 0  . FLUoxetine (PROZAC) 20 MG capsule Take 1 capsule (20 mg total) by mouth every morning. 30 capsule 2  . fluticasone (FLONASE) 50 MCG/ACT nasal spray Place 2 sprays into both nostrils daily. 16 g 2  . ibuprofen (ADVIL,MOTRIN) 800 MG tablet Take 1 tablet (800 mg total) by mouth every 8 (eight) hours as needed. 50 tablet 1  . loratadine (CLARITIN) 5 MG chewable tablet Chew 5 mg by mouth daily as needed for allergies.    . metroNIDAZOLE (FLAGYL) 500 MG tablet Take 1 tablet (500 mg total) by mouth 3 (three) times daily. 30 tablet 0  . ramipril (ALTACE) 10 MG capsule Take 10 mg by mouth daily.     No current facility-administered medications for this visit.    Patient confirms/reports the following allergies:  Allergies  Allergen Reactions  . Shellfish  Allergy Nausea And Vomiting    No orders of the defined types were placed in this encounter.    AUTHORIZATION INFORMATION Primary Insurance: 1D#: Group #:  Secondary Insurance: 1D#: Group #:  SCHEDULE INFORMATION: Date: 12/06/15 Time: Location: [place]

## 2015-11-29 ENCOUNTER — Other Ambulatory Visit: Payer: Self-pay | Admitting: Family Medicine

## 2015-12-05 ENCOUNTER — Encounter: Payer: Self-pay | Admitting: *Deleted

## 2015-12-06 ENCOUNTER — Ambulatory Visit
Admission: RE | Admit: 2015-12-06 | Discharge: 2015-12-06 | Disposition: A | Discharge status: Home / Self Care | Payer: 59 | Source: Ambulatory Visit | Attending: Gastroenterology | Admitting: Gastroenterology

## 2015-12-06 ENCOUNTER — Encounter
Admission: RE | Disposition: A | Discharge status: Home / Self Care | Payer: Self-pay | Source: Ambulatory Visit | Attending: Gastroenterology

## 2015-12-06 ENCOUNTER — Ambulatory Visit: Payer: 59 | Admitting: Anesthesiology

## 2015-12-06 DIAGNOSIS — I429 Cardiomyopathy, unspecified: Secondary | ICD-10-CM | POA: Insufficient documentation

## 2015-12-06 DIAGNOSIS — K580 Irritable bowel syndrome with diarrhea: Secondary | ICD-10-CM | POA: Diagnosis not present

## 2015-12-06 DIAGNOSIS — Z791 Long term (current) use of non-steroidal anti-inflammatories (NSAID): Secondary | ICD-10-CM | POA: Insufficient documentation

## 2015-12-06 DIAGNOSIS — I10 Essential (primary) hypertension: Secondary | ICD-10-CM | POA: Insufficient documentation

## 2015-12-06 DIAGNOSIS — R198 Other specified symptoms and signs involving the digestive system and abdomen: Secondary | ICD-10-CM | POA: Insufficient documentation

## 2015-12-06 DIAGNOSIS — F419 Anxiety disorder, unspecified: Secondary | ICD-10-CM | POA: Insufficient documentation

## 2015-12-06 DIAGNOSIS — Z7951 Long term (current) use of inhaled steroids: Secondary | ICD-10-CM | POA: Insufficient documentation

## 2015-12-06 DIAGNOSIS — Z91013 Allergy to seafood: Secondary | ICD-10-CM | POA: Insufficient documentation

## 2015-12-06 DIAGNOSIS — R197 Diarrhea, unspecified: Secondary | ICD-10-CM | POA: Diagnosis not present

## 2015-12-06 DIAGNOSIS — Z95810 Presence of automatic (implantable) cardiac defibrillator: Secondary | ICD-10-CM | POA: Diagnosis not present

## 2015-12-06 DIAGNOSIS — Z79899 Other long term (current) drug therapy: Secondary | ICD-10-CM | POA: Insufficient documentation

## 2015-12-06 DIAGNOSIS — K641 Second degree hemorrhoids: Secondary | ICD-10-CM | POA: Insufficient documentation

## 2015-12-06 DIAGNOSIS — Z955 Presence of coronary angioplasty implant and graft: Secondary | ICD-10-CM | POA: Insufficient documentation

## 2015-12-06 DIAGNOSIS — R933 Abnormal findings on diagnostic imaging of other parts of digestive tract: Secondary | ICD-10-CM | POA: Diagnosis not present

## 2015-12-06 DIAGNOSIS — Z952 Presence of prosthetic heart valve: Secondary | ICD-10-CM | POA: Insufficient documentation

## 2015-12-06 DIAGNOSIS — Z9049 Acquired absence of other specified parts of digestive tract: Secondary | ICD-10-CM | POA: Diagnosis not present

## 2015-12-06 DIAGNOSIS — Z8249 Family history of ischemic heart disease and other diseases of the circulatory system: Secondary | ICD-10-CM | POA: Diagnosis not present

## 2015-12-06 HISTORY — DX: Cardiac murmur, unspecified: R01.1

## 2015-12-06 HISTORY — PX: COLONOSCOPY WITH PROPOFOL: SHX5780

## 2015-12-06 SURGERY — COLONOSCOPY WITH PROPOFOL
Anesthesia: General

## 2015-12-06 MED ORDER — LIDOCAINE HCL (CARDIAC) 20 MG/ML IV SOLN
INTRAVENOUS | Status: DC | PRN
  Administered 2015-12-06: 40 mg via INTRAVENOUS

## 2015-12-06 MED ORDER — SODIUM CHLORIDE 0.9 % IV SOLN
INTRAVENOUS | Status: DC
  Administered 2015-12-06: 09:00:00 via INTRAVENOUS

## 2015-12-06 MED ORDER — PROPOFOL 10 MG/ML IV BOLUS
INTRAVENOUS | Status: DC | PRN
  Administered 2015-12-06: 60 mg via INTRAVENOUS

## 2015-12-06 MED ORDER — PROPOFOL 500 MG/50ML IV EMUL
INTRAVENOUS | Status: DC | PRN
  Administered 2015-12-06: 150 ug/kg/min via INTRAVENOUS

## 2015-12-06 MED ORDER — SODIUM CHLORIDE 0.9 % IV SOLN: INTRAVENOUS | Status: DC

## 2015-12-06 NOTE — Anesthesia Postprocedure Evaluation (Signed)
Anesthesia Post Note  Patient: [name]  Procedure(s) Performed: Procedure(s) (LRB): COLONOSCOPY WITH PROPOFOL (N/A)  Patient location during evaluation: PACU Anesthesia Type: General Level of consciousness: awake and alert Pain management: pain level controlled Vital Signs Assessment: post-procedure vital signs reviewed and stable Respiratory status: spontaneous breathing, nonlabored ventilation, respiratory function stable and patient connected to nasal cannula oxygen Cardiovascular status: blood pressure returned to baseline and stable Postop Assessment: no signs of nausea or vomiting Anesthetic complications: no    Last Vitals:  Filed Vitals:   12/06/15 0837 12/06/15 1006  BP: 119/86 92/53  Pulse: 72 81  Temp: 36.3 C 35.9 C  Resp: 17 24    Last Pain: There were no vitals filed for this visit.               Molli Barrows

## 2015-12-06 NOTE — Anesthesia Preprocedure Evaluation (Signed)
Anesthesia Evaluation  Patient identified by MRN, date of birth, ID band Patient awake    Reviewed: Allergy & Precautions, H&P , NPO status , Patient's Chart, lab work & pertinent test results, reviewed documented beta blocker date and time   Airway Mallampati: II   Neck ROM: full    Dental  (+) Teeth Intact   Pulmonary neg pulmonary ROS,    Pulmonary exam normal        Cardiovascular Exercise Tolerance: Good hypertension, On Medications + CAD and + CABG  negative cardio ROS Normal cardiovascular examVentricular Tachycardia + Valvular Problems/Murmurs  Rhythm:regular Rate:Normal     Neuro/Psych negative neurological ROS  negative psych ROS   GI/Hepatic negative GI ROS, Neg liver ROS,   Endo/Other  negative endocrine ROS  Renal/GU negative Renal ROS  negative genitourinary   Musculoskeletal   Abdominal   Peds  Hematology negative hematology ROS (+)   Anesthesia Other Findings Past Medical History:   Secondary cardiomyopathy ([place])                               Ventricular septal defect                                    Aortic valve replaced                                          Comment:Requiring replacement, specifics not available   Syncope                                                      Ventricular tachycardia ([facility])                                  Comment:appropriate VT shock therapy /13   ICD (implantable cardiac defibrillator), BSX s*              Anxiety                                                      Hypertension                                                 IBD (inflammatory bowel disease)                09/23/2015     Heart murmur                                               Past Surgical History:   VSD REPAIR  AORTIC VALVE REPLACEMENT                                        Comment:Bioprosthesis   ANGIOPLASTY                                                      Comment:RCA repair with vein angioplasty following               injury with the aforementioned surgery   CARDIAC DEFIBRILLATOR PLACEMENT                               IMPLANTABLE CARDIOVERTER DEFIBRILLATOR GENERAT* N/A 05/05/2013     Comment:Procedure: IMPLANTABLE CARDIOVERTER               DEFIBRILLATOR GENERATOR CHANGE;  Surgeon:               Deboraha Sprang, MD;  Location: [facility] CATH LAB;                Service: Cardiovascular;  Laterality: N/A;   CORONARY ARTERY BYPASS GRAFT                                  INSERT / REPLACE / REMOVE PACEMAKER                           CARDIAC VALVE REPLACEMENT                                     CHOLECYSTECTOMY                                             BMI    Body Mass Index   32.17 kg/m 2     Reproductive/Obstetrics                             Anesthesia Physical Anesthesia Plan  ASA: III  Anesthesia Plan: General   Post-op Pain Management:    Induction:   Airway Management Planned:   Additional Equipment:   Intra-op Plan:   Post-operative Plan:   Informed Consent: I have reviewed the patients History and Physical, chart, labs and discussed the procedure including the risks, benefits and alternatives for the proposed anesthesia with the patient or authorized representative who has indicated his/her understanding and acceptance.   Dental Advisory Given  Plan Discussed with: CRNA  Anesthesia Plan Comments:         Anesthesia Quick Evaluation

## 2015-12-06 NOTE — Op Note (Signed)
[facility] Gastroenterology Patient Name: [name] Procedure Date: 12/06/2015 9:33 AM MRN: [id number] Account #: [id number] Date of Birth: Nov 25, 1975 Admit Type: Outpatient Age: 40 Room: [facility] ENDO ROOM 4 Gender: Male Note Status: Finalized Procedure:            Colonoscopy Indications:          Clinically significant diarrhea of unexplained origin,                        Abnormal CT of the GI tract Providers:            [name] MD, MD Referring MD:         Arnetha Courser (Referring MD) Medicines:            Propofol per Anesthesia Complications:        No immediate complications. Procedure:            Pre-Anesthesia Assessment:                       - Prior to the procedure, a History and Physical was                        performed, and patient medications and allergies were                        reviewed. The patient's tolerance of previous                        anesthesia was also reviewed. The risks and benefits of                        the procedure and the sedation options and risks were                        discussed with the patient. All questions were                        answered, and informed consent was obtained. Prior                        Anticoagulants: The patient has taken no previous                        anticoagulant or antiplatelet agents. ASA Grade                        Assessment: II - A patient with mild systemic disease.                        After reviewing the risks and benefits, the patient was                        deemed in satisfactory condition to undergo the                        procedure.                       After obtaining informed consent, the colonoscope was  passed under direct vision. Throughout the procedure,                        the patient's blood pressure, pulse, and oxygen                        saturations were monitored continuously. The   Colonoscope was introduced through the anus and                        advanced to the the terminal ileum. The colonoscopy was                        performed without difficulty. The patient tolerated the                        procedure well. The quality of the bowel preparation                        was excellent. Findings:      The perianal and digital rectal examinations were normal.      The terminal ileum appeared normal. Biopsies were taken with a cold       forceps for histology.      Non-bleeding internal hemorrhoids were found during retroflexion. The       hemorrhoids were Grade II (internal hemorrhoids that prolapse but reduce       spontaneously).      The exam was otherwise without abnormality.      Random biopsies were obtained with cold forceps for histology randomly       in the entire colon. Impression:           - The examined portion of the ileum was normal.                        Biopsied.                       - Non-bleeding internal hemorrhoids.                       - The examination was otherwise normal.                       - Random biopsies were obtained in the entire colon. Recommendation:       - Await pathology results. Procedure Code(s):    --- Professional ---                       [phone], Colonoscopy, flexible; with biopsy, single or                        multiple Diagnosis Code(s):    --- Professional ---                       R93.3, Abnormal findings on diagnostic imaging of other                        parts of digestive tract                       R19.7, Diarrhea, unspecified CPT copyright 2016 American Medical Association.  All rights reserved. The codes documented in this report are preliminary and upon coder review may  be revised to meet current compliance requirements. [name] MD, MD 12/06/2015 10:02:33 AM This report has been signed electronically. Number of Addenda: 0 Note Initiated On: 12/06/2015 9:33 AM Scope Withdrawal Time: 0  hours 9 minutes 23 seconds  Total Procedure Duration: 0 hours 11 minutes 29 seconds       [facility]

## 2015-12-06 NOTE — H&P (Signed)
[name], MD [facility] [address], [place], [place] [address] Phone: [phone] Fax : [phone]  Primary Care Physician:  Enid Derry, MD Primary Gastroenterologist:  Dr. Allen Norris  Pre-Procedure History & Physical: HPI:  [name] is a 40 y.o. male is here for an colonoscopy.   Past Medical History  Diagnosis Date  . Secondary cardiomyopathy ([place])   . Ventricular septal defect   . Aortic valve replaced     Requiring replacement, specifics not available  . Syncope   . Ventricular tachycardia ([place])     appropriate VT shock therapy /13  . ICD (implantable cardiac defibrillator), BSX single   . Anxiety   . Hypertension   . IBD (inflammatory bowel disease) 09/23/2015  . Heart murmur     Past Surgical History  Procedure Laterality Date  . Vsd repair    . Aortic valve replacement      Bioprosthesis  . Angioplasty      RCA repair with vein angioplasty following injury with the aforementioned surgery  . Cardiac defibrillator placement    . Implantable cardioverter defibrillator generator change N/A 05/05/2013    Procedure: IMPLANTABLE CARDIOVERTER DEFIBRILLATOR GENERATOR CHANGE;  Surgeon: Deboraha Sprang, MD;  Location: [facility] CATH LAB;  Service: Cardiovascular;  Laterality: N/A;  . Coronary artery bypass graft    . Insert / replace / remove pacemaker    . Cardiac valve replacement    . Cholecystectomy      Prior to Admission medications   Medication Sig Start Date End Date Taking? Authorizing Provider  carvedilol (COREG) 25 MG tablet Take 1 tablet (25 mg total) by mouth 2 (two) times daily with a meal. 09/03/15  Yes Bhavinkumar Bhagat, PA  ciprofloxacin (CIPRO) 500 MG tablet Take 1 tablet (500 mg total) by mouth 2 (two) times daily. 09/23/15   Arnetha Courser, MD  FLUoxetine (PROZAC) 20 MG capsule TAKE 1 CAPSULE(20 MG) BY MOUTH EVERY MORNING 11/30/15   Arnetha Courser, MD  fluticasone (FLONASE) 50 MCG/ACT nasal spray Place 2 sprays into both nostrils daily. 08/17/15    Steele Sizer, MD  ibuprofen (ADVIL,MOTRIN) 800 MG tablet Take 1 tablet (800 mg total) by mouth every 8 (eight) hours as needed. 07/29/15   Bobetta Lime, MD  loratadine (CLARITIN) 5 MG chewable tablet Chew 5 mg by mouth daily as needed for allergies.    Historical Provider, MD  metroNIDAZOLE (FLAGYL) 500 MG tablet Take 1 tablet (500 mg total) by mouth 3 (three) times daily. 09/23/15   Arnetha Courser, MD  ramipril (ALTACE) 10 MG capsule Take 10 mg by mouth daily. 06/20/15   Historical Provider, MD    Allergies as of 10/26/2015 - Review Complete 09/15/2015  Allergen Reaction Noted  . Shellfish allergy Nausea And Vomiting 11/06/2010    Family History  Problem Relation Age of Onset  . Heart disease Father     Social History   Social History  . Marital Status: Married    Spouse Name: N/A  . Number of Children: N/A  . Years of Education: N/A   Occupational History  . Full time    Social History Main Topics  . Smoking status: Never Smoker   . Smokeless tobacco: Never Used  . Alcohol Use: No  . Drug Use: No  . Sexual Activity: Not on file   Other Topics Concern  . Not on file   Social History Narrative   Married    Review of Systems: See HPI, otherwise negative ROS  Physical Exam: BP 119/86 mmHg  Pulse 72  Temp(Src) 97.3 F (36.3 C) (Tympanic)  Resp 17  Ht 5\' 9"  (1.753 m)  Wt 218 lb (98.884 kg)  BMI 32.18 kg/m2  SpO2 96% General:   Alert,  pleasant and cooperative in NAD Head:  Normocephalic and atraumatic. Neck:  Supple; no masses or thyromegaly. Lungs:  Clear throughout to auscultation.    Heart:  Regular rate and rhythm. Abdomen:  Soft, nontender and nondistended. Normal bowel sounds, without guarding, and without rebound.   Neurologic:  Alert and  oriented x4;  grossly normal neurologically.  Impression/Plan: [name] is here for an colonoscopy to be performed for abnormal CT and diarrhea  Risks, benefits, limitations, and alternatives regarding   colonoscopy have been reviewed with the patient.  Questions have been answered.  All parties agreeable.   [name], MD  12/06/2015, 9:35 AM

## 2015-12-06 NOTE — Transfer of Care (Signed)
Immediate Anesthesia Transfer of Care Note  Patient: [name]  Procedure(s) Performed: Procedure(s): COLONOSCOPY WITH PROPOFOL (N/A)  Patient Location: PACU  Anesthesia Type:General  Level of Consciousness: sedated  Airway & Oxygen Therapy: Patient Spontanous Breathing and Patient connected to nasal cannula oxygen  Post-op Assessment: Report given to RN and Post -op Vital signs reviewed and stable  Post vital signs: Reviewed and stable  Last Vitals:  Filed Vitals:   12/06/15 0837  BP: 119/86  Pulse: 72  Temp: 36.3 C  Resp: 17    Last Pain: There were no vitals filed for this visit.       Complications: No apparent anesthesia complications

## 2015-12-07 ENCOUNTER — Encounter: Payer: Self-pay | Admitting: Gastroenterology

## 2015-12-09 LAB — SURGICAL PATHOLOGY

## 2015-12-13 ENCOUNTER — Telehealth: Payer: Self-pay

## 2015-12-13 NOTE — Telephone Encounter (Signed)
-----   Message from [name], MD sent at 12/12/2015 12:19 PM EDT ----- Let the patient know that the biopsies of the end of the small bowel and throughout the colon were all normal. If the patient is still having diarrhea please have him follow-up in the office.

## 2015-12-13 NOTE — Telephone Encounter (Signed)
Pt notified of colonoscopy results. Pt advised he was still feeling some fatigue but wanted to make sure his GI track was ok. Advised him to follow up with his PCP to seek further investigation for symptom. Pt stated he has an appt for a PE next week.

## 2015-12-13 NOTE — Telephone Encounter (Signed)
LVM for pt to return my call.

## 2015-12-22 ENCOUNTER — Ambulatory Visit (INDEPENDENT_AMBULATORY_CARE_PROVIDER_SITE_OTHER): Payer: 59 | Admitting: Family Medicine

## 2015-12-22 ENCOUNTER — Encounter: Payer: Self-pay | Admitting: Family Medicine

## 2015-12-22 VITALS — BP 122/78 | HR 80 | Temp 98.1°F | Resp 16 | Ht 69.5 in | Wt 218.0 lb

## 2015-12-22 DIAGNOSIS — Z1159 Encounter for screening for other viral diseases: Secondary | ICD-10-CM

## 2015-12-22 DIAGNOSIS — Z125 Encounter for screening for malignant neoplasm of prostate: Secondary | ICD-10-CM | POA: Insufficient documentation

## 2015-12-22 DIAGNOSIS — Z0000 Encounter for general adult medical examination without abnormal findings: Secondary | ICD-10-CM

## 2015-12-22 DIAGNOSIS — E669 Obesity, unspecified: Secondary | ICD-10-CM | POA: Diagnosis not present

## 2015-12-22 DIAGNOSIS — Z113 Encounter for screening for infections with a predominantly sexual mode of transmission: Secondary | ICD-10-CM

## 2015-12-22 DIAGNOSIS — Z114 Encounter for screening for human immunodeficiency virus [HIV]: Secondary | ICD-10-CM

## 2015-12-22 DIAGNOSIS — H9193 Unspecified hearing loss, bilateral: Secondary | ICD-10-CM

## 2015-12-22 HISTORY — DX: Obesity, unspecified: E66.9

## 2015-12-22 HISTORY — DX: Unspecified hearing loss, bilateral: H91.93

## 2015-12-22 NOTE — Assessment & Plan Note (Signed)
Refer to ENT

## 2015-12-22 NOTE — Assessment & Plan Note (Signed)
check

## 2015-12-22 NOTE — Assessment & Plan Note (Addendum)
Starting today, information given; will write waiver or appeal to [employer] that he is getting serious about weight loss, talked to me about it, he will work on 26 pounds of weight loss over next 26 weeks; if he is not able to meet criteria next year, I will not be able to write another appeal; see AVS

## 2015-12-22 NOTE — Assessment & Plan Note (Signed)
screen 

## 2015-12-22 NOTE — Patient Instructions (Addendum)
Check out the information at familydoctor.org entitled "Nutrition for Weight Loss: What You Need to Know about Fad Diets" Try to lose between 1-2 pounds per week by taking in fewer calories and burning off more calories You can succeed by limiting portions, limiting foods dense in calories and fat, becoming more active, and drinking 8 glasses of water a day (64 ounces) Don't skip meals, especially breakfast, as skipping meals may alter your metabolism Do not use over-the-counter weight loss pills or gimmicks that claim rapid weight loss A healthy BMI (or body mass index) is between 18.5 and 24.9 You can calculate your ideal BMI at the NIH website ClubMonetize.fr We'll aim for 192 pounds over the next 6 months  We'll get labs today We'll refer you to the ENT about your hearing If you have not heard anything from my staff in a week about any orders/referrals/studies from today, please contact us here to follow-up ([phone]  Health Maintenance, Male A healthy lifestyle and preventative care can promote health and wellness.  Maintain regular health, dental, and eye exams.  Eat a healthy diet. Foods like vegetables, fruits, whole grains, low-fat dairy products, and lean protein foods contain the nutrients you need and are low in calories. Decrease your intake of foods high in solid fats, added sugars, and salt. Get information about a proper diet from your health care provider, if necessary.  Regular physical exercise is one of the most important things you can do for your health. Most adults should get at least 150 minutes of moderate-intensity exercise (any activity that increases your heart rate and causes you to sweat) each week. In addition, most adults need muscle-strengthening exercises on 2 or more days a week.   Maintain a healthy weight. The body mass index (BMI) is a screening tool to identify possible weight problems. It provides  an estimate of body fat based on height and weight. Your health care provider can find your BMI and can help you achieve or maintain a healthy weight. For males 20 years and older:  A BMI below 18.5 is considered underweight.  A BMI of 18.5 to 24.9 is normal.  A BMI of 25 to 29.9 is considered overweight.  A BMI of 30 and above is considered obese.  Maintain normal blood lipids and cholesterol by exercising and minimizing your intake of saturated fat. Eat a balanced diet with plenty of fruits and vegetables. Blood tests for lipids and cholesterol should begin at age 14 and be repeated every 5 years. If your lipid or cholesterol levels are high, you are over age 24, or you are at high risk for heart disease, you may need your cholesterol levels checked more frequently.Ongoing high lipid and cholesterol levels should be treated with medicines if diet and exercise are not working.  If you smoke, find out from your health care provider how to quit. If you do not use tobacco, do not start.  Lung cancer screening is recommended for adults aged 51-80 years who are at high risk for developing lung cancer because of a history of smoking. A yearly low-dose CT scan of the lungs is recommended for people who have at least a 30-pack-year history of smoking and are current smokers or have quit within the past 15 years. A pack year of smoking is smoking an average of 1 pack of cigarettes a day for 1 year (for example, a 30-pack-year history of smoking could mean smoking 1 pack a day for 30 years or 2 packs  a day for 15 years). Yearly screening should continue until the smoker has stopped smoking for at least 15 years. Yearly screening should be stopped for people who develop a health problem that would prevent them from having lung cancer treatment.  If you choose to drink alcohol, do not have more than 2 drinks per day. One drink is considered to be 12 oz (360 mL) of beer, 5 oz (150 mL) of wine, or 1.5 oz (45 mL)  of liquor.  Avoid the use of street drugs. Do not share needles with anyone. Ask for help if you need support or instructions about stopping the use of drugs.  High blood pressure causes heart disease and increases the risk of stroke. High blood pressure is more likely to develop in:  People who have blood pressure in the end of the normal range (100-139/85-89 mm Hg).  People who are overweight or obese.  People who are African American.  If you are 69-21 years of age, have your blood pressure checked every 3-5 years. If you are 69 years of age or older, have your blood pressure checked every year. You should have your blood pressure measured twice--once when you are at a hospital or clinic, and once when you are not at a hospital or clinic. Record the average of the two measurements. To check your blood pressure when you are not at a hospital or clinic, you can use:  An automated blood pressure machine at a pharmacy.  A home blood pressure monitor.  If you are 31-83 years old, ask your health care provider if you should take aspirin to prevent heart disease.  Diabetes screening involves taking a blood sample to check your fasting blood sugar level. This should be done once every 3 years after age 28 if you are at a normal weight and without risk factors for diabetes. Testing should be considered at a younger age or be carried out more frequently if you are overweight and have at least 1 risk factor for diabetes.  Colorectal cancer can be detected and often prevented. Most routine colorectal cancer screening begins at the age of 55 and continues through age 12. However, your health care provider may recommend screening at an earlier age if you have risk factors for colon cancer. On a yearly basis, your health care provider may provide home test kits to check for hidden blood in the stool. A small camera at the end of a tube may be used to directly examine the colon (sigmoidoscopy or  colonoscopy) to detect the earliest forms of colorectal cancer. Talk to your health care provider about this at age 77 when routine screening begins. A direct exam of the colon should be repeated every 5-10 years through age 66, unless early forms of precancerous polyps or small growths are found.  People who are at an increased risk for hepatitis B should be screened for this virus. You are considered at high risk for hepatitis B if:  You were born in a country where hepatitis B occurs often. Talk with your health care provider about which countries are considered high risk.  Your parents were born in a high-risk country and you have not received a shot to protect against hepatitis B (hepatitis B vaccine).  You have HIV or AIDS.  You use needles to inject street drugs.  You live with, or have sex with, someone who has hepatitis B.  You are a man who has sex with other men (MSM).  You  get hemodialysis treatment.  You take certain medicines for conditions like cancer, organ transplantation, and autoimmune conditions.  Hepatitis C blood testing is recommended for all people born from [birth year] through 1967 and any individual with known risk factors for hepatitis C.  Healthy men should no longer receive prostate-specific antigen (PSA) blood tests as part of routine cancer screening. Talk to your health care provider about prostate cancer screening.  Testicular cancer screening is not recommended for adolescents or adult males who have no symptoms. Screening includes self-exam, a health care provider exam, and other screening tests. Consult with your health care provider about any symptoms you have or any concerns you have about testicular cancer.  Practice safe sex. Use condoms and avoid high-risk sexual practices to reduce the spread of sexually transmitted infections (STIs).  You should be screened for STIs, including gonorrhea and chlamydia if:  You are sexually active and are younger than  24 years.  You are older than 24 years, and your health care provider tells you that you are at risk for this type of infection.  Your sexual activity has changed since you were last screened, and you are at an increased risk for chlamydia or gonorrhea. Ask your health care provider if you are at risk.  If you are at risk of being infected with HIV, it is recommended that you take a prescription medicine daily to prevent HIV infection. This is called pre-exposure prophylaxis (PrEP). You are considered at risk if:  You are a man who has sex with other men (MSM).  You are a heterosexual man who is sexually active with multiple partners.  You take drugs by injection.  You are sexually active with a partner who has HIV.  Talk with your health care provider about whether you are at high risk of being infected with HIV. If you choose to begin PrEP, you should first be tested for HIV. You should then be tested every 3 months for as long as you are taking PrEP.  Use sunscreen. Apply sunscreen liberally and repeatedly throughout the day. You should seek shade when your shadow is shorter than you. Protect yourself by wearing long sleeves, pants, a wide-brimmed hat, and sunglasses year round whenever you are outdoors.  Tell your health care provider of new moles or changes in moles, especially if there is a change in shape or color. Also, tell your health care provider if a mole is larger than the size of a pencil eraser.  A one-time screening for abdominal aortic aneurysm (AAA) and surgical repair of large AAAs by ultrasound is recommended for men aged 37-75 years who are current or former smokers.  Stay current with your vaccines (immunizations).   This information is not intended to replace advice given to you by your health care provider. Make sure you discuss any questions you have with your health care provider.   Document Released: 11/03/2007 Document Revised: 05/28/2014 Document Reviewed:  10/02/2010 Elsevier Interactive Patient Education [employer].

## 2015-12-22 NOTE — Progress Notes (Signed)
Patient ID: [name], male   DOB: Aug 02, 1975, 40 y.o.   MRN: [id number]   Subjective:   [name] is a 40 y.o. male here for a complete physical exam  Interim issues since last visit: none reported  USPSTF grade A and B recommendations Alcohol: socially Depression:  Depression screen [facility] 2/9 12/22/2015 09/15/2015 07/29/2015 05/10/2015  Decreased Interest 0 0 0 0  Down, Depressed, Hopeless 0 0 0 0  PHQ - 2 Score 0 0 0 0   Hypertension: controlled Obesity: working on that starting today; patient needs some sort of appeal to his work saying that he is going to work on weight loss with his provider Tobacco use: no HIV, hep B, hep C: check STD testing and prevention (chl/gon/syphilis): declined Lipids: today Glucose: today Colorectal cancer: colonoscopy with Dr. Allen Norris Breast cancer: no lumps Lung cancer: no Osteoporosis: n/a AAA: n/a Aspirin: n/a Diet: discussed Exercise: tries Skin cancer: no worrisome moles  Past Medical History:  Diagnosis Date  . Anxiety   . Aortic valve replaced    Requiring replacement, specifics not available  . Hearing loss of both ears 12/22/2015  . Heart murmur   . Hypertension   . IBD (inflammatory bowel disease) 09/23/2015  . ICD (implantable cardiac defibrillator), BSX single   . Obesity 12/22/2015  . Secondary cardiomyopathy ([place])   . Syncope   . Ventricular septal defect   . Ventricular tachycardia ([place])    appropriate VT shock therapy /13   Past Surgical History:  Procedure Laterality Date  . ANGIOPLASTY     RCA repair with vein angioplasty following injury with the aforementioned surgery  . AORTIC VALVE REPLACEMENT     Bioprosthesis  . CARDIAC DEFIBRILLATOR PLACEMENT    . CARDIAC VALVE REPLACEMENT    . CHOLECYSTECTOMY    . COLONOSCOPY WITH PROPOFOL N/A 12/06/2015   Procedure: COLONOSCOPY WITH PROPOFOL;  Surgeon: [name], MD;  Location: [facility] ENDOSCOPY;  Service: Endoscopy;  Laterality: N/A;  . CORONARY ARTERY BYPASS GRAFT     . IMPLANTABLE CARDIOVERTER DEFIBRILLATOR GENERATOR CHANGE N/A 05/05/2013   Procedure: IMPLANTABLE CARDIOVERTER DEFIBRILLATOR GENERATOR CHANGE;  Surgeon: Deboraha Sprang, MD;  Location: [facility] CATH LAB;  Service: Cardiovascular;  Laterality: N/A;  . INSERT / REPLACE / REMOVE PACEMAKER    . VSD REPAIR     Family History  Problem Relation Age of Onset  . Heart disease Father    Social History  Substance Use Topics  . Smoking status: Never Smoker  . Smokeless tobacco: Never Used  . Alcohol use No   Review of Systems  Objective:   Vitals:   12/22/15 1120  BP: 122/78  Pulse: 80  Resp: 16  Temp: 98.1 F (36.7 C)  TempSrc: Oral  SpO2: 93%  Weight: 218 lb (98.9 kg)  Height: 5' 9.5" (1.765 m)   Body mass index is 31.73 kg/m. Wt Readings from Last 3 Encounters:  12/22/15 218 lb (98.9 kg)  12/06/15 218 lb (98.9 kg)  09/15/15 219 lb 3.2 oz (99.4 kg)   Physical Exam  Constitutional: He appears well-developed and well-nourished. No distress.  HENT:  Head: Normocephalic and atraumatic.  Nose: Nose normal.  Mouth/Throat: Oropharynx is clear and moist.  Eyes: EOM are normal. No scleral icterus.  Neck: No JVD present. No thyromegaly present.  Cardiovascular: Normal rate, regular rhythm and normal heart sounds.   Pulmonary/Chest: Effort normal and breath sounds normal. No respiratory distress. He has no wheezes. He has no rales.  Abdominal: Soft.  Bowel sounds are normal. He exhibits no distension. There is no tenderness. There is no guarding.  Musculoskeletal: Normal range of motion. He exhibits no edema.  Lymphadenopathy:    He has no cervical adenopathy.  Neurological: He is alert. He displays normal reflexes. He exhibits normal muscle tone. Coordination normal.  Skin: Skin is warm and dry. No rash noted. He is not diaphoretic. No erythema. No pallor.  Psychiatric: He has a normal mood and affect. His behavior is normal. Judgment and thought content normal.   Assessment/Plan:    Problem List Items Addressed This Visit      Nervous and Auditory   Hearing loss of both ears    Refer to ENT      Relevant Orders   Ambulatory referral to ENT     Other   Prostate cancer screening    screen      Relevant Orders   PSA (Completed)   Preventative health care - Primary    USPSTF grade A and B recommendations reviewed with patient; age-appropriate recommendations, preventive care, screening tests, etc discussed and encouraged; healthy living encouraged; see AVS for patient education given to patient      Relevant Orders   LP+Creat+Hb A1c   Glucose, random (Completed)   CBC with Differential/Platelet (Completed)   TSH (Completed)   Hepatic function panel (Completed)   Obesity    Starting today, information given; will write waiver or appeal to [employer] that he is getting serious about weight loss, talked to me about it, he will work on 26 pounds of weight loss over next 26 weeks; if he is not able to meet criteria next year, I will not be able to write another appeal; see AVS      Need for hepatitis C screening test    check      Relevant Orders   Hepatitis panel, acute (Completed)   Need for hepatitis B screening test    Check       Relevant Orders   Hepatitis panel, acute (Completed)   Encounter for screening for HIV   Relevant Orders   HIV antibody (Completed)    Other Visit Diagnoses    Screen for STD (sexually transmitted disease)       Relevant Orders   RPR (Completed)   GC/Chlamydia Probe Amp      No orders of the defined types were placed in this encounter.  Orders Placed This Encounter  Procedures  . GC/Chlamydia Probe Amp  . GC/Chlamydia Probe Amp  . LP+Creat+Hb A1c  . Glucose, random  . CBC with Differential/Platelet  . TSH  . Hepatic function panel  . PSA  . Hepatitis panel, acute  . HIV antibody  . RPR  . LP+Creat+Hb A1c  . Ambulatory referral to ENT    Referral Priority:   Routine    Referral Type:   Consultation     Referral Reason:   Specialty Services Required    Requested Specialty:   Otolaryngology    Number of Visits Requested:   1    Follow up plan: Return in about 1 year (around 12/21/2016) for complete physical.  An after-visit summary was printed and given to the patient at [facility].  Please see the patient instructions which may contain other information and recommendations beyond what is mentioned above in the assessment and plan.

## 2015-12-22 NOTE — Assessment & Plan Note (Signed)
Check

## 2015-12-25 LAB — CBC WITH DIFFERENTIAL/PLATELET
BASOS ABS: 0.1 10*3/uL (ref 0.0–0.2)
Basos: 2 %
EOS (ABSOLUTE): 0.2 10*3/uL (ref 0.0–0.4)
Eos: 5 %
Hematocrit: 43.5 % (ref 37.5–51.0)
Hemoglobin: 14.4 g/dL (ref 12.6–17.7)
Immature Grans (Abs): 0 10*3/uL (ref 0.0–0.1)
Immature Granulocytes: 1 %
LYMPHS ABS: 1.1 10*3/uL (ref 0.7–3.1)
Lymphs: 33 %
MCH: 28.2 pg (ref 26.6–33.0)
MCHC: 33.1 g/dL (ref 31.5–35.7)
MCV: 85 fL (ref 79–97)
MONOCYTES: 13 %
Monocytes Absolute: 0.4 10*3/uL (ref 0.1–0.9)
Neutrophils Absolute: 1.6 10*3/uL (ref 1.4–7.0)
Neutrophils: 46 %
PLATELETS: 153 10*3/uL (ref 150–379)
RBC: 5.11 x10E6/uL (ref 4.14–5.80)
RDW: 12.8 % (ref 12.3–15.4)
WBC: 3.4 10*3/uL (ref 3.4–10.8)

## 2015-12-25 LAB — LP+CREAT+HB A1C
CHOL/HDL RATIO: 4 ratio (ref 0.0–5.0)
Cholesterol, Total: 174 mg/dL (ref 100–199)
Creatinine, Ser: 1.11 mg/dL (ref 0.76–1.27)
GFR, EST AFRICAN AMERICAN: 95 mL/min/{1.73_m2} (ref >59)
GFR, EST NON AFRICAN AMERICAN: 82 mL/min/{1.73_m2} (ref >59)
HDL: 43 mg/dL (ref >39)
HEMOGLOBIN A1C: 4.9 % (ref 4.8–5.6)
LDL Calculated: 115 mg/dL — ABNORMAL HIGH (ref 0–99)
Triglycerides: 82 mg/dL (ref 0–149)
VLDL CHOLESTEROL CAL: 16 mg/dL (ref 5–40)

## 2015-12-25 LAB — HIV ANTIBODY (ROUTINE TESTING W REFLEX): HIV Screen 4th Generation wRfx: NONREACTIVE

## 2015-12-25 LAB — HEPATIC FUNCTION PANEL
ALK PHOS: 36 IU/L — AB (ref 39–117)
ALT: 47 IU/L — AB (ref 0–44)
AST: 31 IU/L (ref 0–40)
Albumin: 4.1 g/dL (ref 3.5–5.5)
BILIRUBIN TOTAL: 0.9 mg/dL (ref 0.0–1.2)
BILIRUBIN, DIRECT: 0.21 mg/dL (ref 0.00–0.40)
Total Protein: 7.2 g/dL (ref 6.0–8.5)

## 2015-12-25 LAB — GLUCOSE, RANDOM: GLUCOSE: 88 mg/dL (ref 65–99)

## 2015-12-25 LAB — TSH: TSH: 2.39 u[IU]/mL (ref 0.450–4.500)

## 2015-12-25 LAB — GC/CHLAMYDIA PROBE AMP
Chlamydia trachomatis, NAA: NEGATIVE
NEISSERIA GONORRHOEAE BY PCR: NEGATIVE

## 2015-12-25 LAB — HEPATITIS PANEL, ACUTE
HEP C VIRUS AB: 0.1 {s_co_ratio} (ref 0.0–0.9)
Hep A IgM: NEGATIVE
Hep B C IgM: NEGATIVE
Hepatitis B Surface Ag: NEGATIVE

## 2015-12-25 LAB — RPR: RPR: NONREACTIVE

## 2015-12-25 LAB — PSA: PROSTATE SPECIFIC AG, SERUM: 1.1 ng/mL (ref 0.0–4.0)

## 2015-12-25 NOTE — Assessment & Plan Note (Signed)
USPSTF grade A and B recommendations reviewed with patient; age-appropriate recommendations, preventive care, screening tests, etc discussed and encouraged; healthy living encouraged; see AVS for patient education given to patient  

## 2015-12-26 ENCOUNTER — Telehealth: Payer: Self-pay | Admitting: Emergency Medicine

## 2015-12-26 NOTE — Telephone Encounter (Signed)
Patient notified of appointment with [facility] Dr. Pryor Ochoa on August 23,2017 at 2:00pm

## 2015-12-27 ENCOUNTER — Other Ambulatory Visit: Payer: Self-pay | Admitting: Internal Medicine

## 2015-12-29 ENCOUNTER — Other Ambulatory Visit: Payer: Self-pay | Admitting: Internal Medicine

## 2016-03-25 ENCOUNTER — Other Ambulatory Visit: Payer: Self-pay | Admitting: Family Medicine

## 2016-03-25 MED ORDER — FLUOXETINE HCL 20 MG PO CAPS: 20.0000 mg | ORAL_CAPSULE | Freq: Every day | ORAL | 9 refills | Status: DC

## 2016-05-15 ENCOUNTER — Inpatient Hospital Stay
Admission: EM | Admit: 2016-05-15 | Discharge: 2016-05-17 | DRG: 309 | Disposition: A | Discharge status: Home / Self Care | Payer: 59 | Attending: Internal Medicine | Admitting: Internal Medicine

## 2016-05-15 ENCOUNTER — Encounter: Payer: Self-pay | Admitting: Emergency Medicine

## 2016-05-15 DIAGNOSIS — Z6830 Body mass index (BMI) 30.0-30.9, adult: Secondary | ICD-10-CM

## 2016-05-15 DIAGNOSIS — E669 Obesity, unspecified: Secondary | ICD-10-CM | POA: Diagnosis present

## 2016-05-15 DIAGNOSIS — I10 Essential (primary) hypertension: Secondary | ICD-10-CM | POA: Diagnosis present

## 2016-05-15 DIAGNOSIS — Z8249 Family history of ischemic heart disease and other diseases of the circulatory system: Secondary | ICD-10-CM

## 2016-05-15 DIAGNOSIS — I472 Ventricular tachycardia, unspecified: Secondary | ICD-10-CM

## 2016-05-15 DIAGNOSIS — Z91013 Allergy to seafood: Secondary | ICD-10-CM

## 2016-05-15 DIAGNOSIS — Z95810 Presence of automatic (implantable) cardiac defibrillator: Secondary | ICD-10-CM

## 2016-05-15 DIAGNOSIS — Z953 Presence of xenogenic heart valve: Secondary | ICD-10-CM

## 2016-05-15 DIAGNOSIS — M79661 Pain in right lower leg: Secondary | ICD-10-CM

## 2016-05-15 DIAGNOSIS — I252 Old myocardial infarction: Secondary | ICD-10-CM

## 2016-05-15 DIAGNOSIS — Z79899 Other long term (current) drug therapy: Secondary | ICD-10-CM

## 2016-05-15 DIAGNOSIS — R55 Syncope and collapse: Secondary | ICD-10-CM | POA: Diagnosis present

## 2016-05-15 DIAGNOSIS — F419 Anxiety disorder, unspecified: Secondary | ICD-10-CM | POA: Diagnosis present

## 2016-05-15 DIAGNOSIS — I429 Cardiomyopathy, unspecified: Secondary | ICD-10-CM | POA: Diagnosis present

## 2016-05-15 DIAGNOSIS — Z952 Presence of prosthetic heart valve: Secondary | ICD-10-CM

## 2016-05-15 DIAGNOSIS — M79662 Pain in left lower leg: Secondary | ICD-10-CM

## 2016-05-15 DIAGNOSIS — H9193 Unspecified hearing loss, bilateral: Secondary | ICD-10-CM | POA: Diagnosis present

## 2016-05-15 DIAGNOSIS — Z8774 Personal history of (corrected) congenital malformations of heart and circulatory system: Secondary | ICD-10-CM

## 2016-05-15 LAB — CBC
HCT: 43.9 % (ref 40.0–52.0)
Hemoglobin: 14.9 g/dL (ref 13.0–18.0)
MCH: 29.3 pg (ref 26.0–34.0)
MCHC: 33.8 g/dL (ref 32.0–36.0)
MCV: 86.4 fL (ref 80.0–100.0)
Platelets: 134 10*3/uL — ABNORMAL LOW (ref 150–440)
RBC: 5.08 MIL/uL (ref 4.40–5.90)
RDW: 12.5 % (ref 11.5–14.5)
WBC: 6.4 10*3/uL (ref 3.8–10.6)

## 2016-05-15 LAB — BASIC METABOLIC PANEL
Anion gap: 6 (ref 5–15)
BUN: 16 mg/dL (ref 6–20)
CALCIUM: 9.1 mg/dL (ref 8.9–10.3)
CO2: 30 mmol/L (ref 22–32)
Chloride: 104 mmol/L (ref 101–111)
Creatinine, Ser: 1.2 mg/dL (ref 0.61–1.24)
GFR calc Af Amer: >60 mL/min (ref >60)
GLUCOSE: 135 mg/dL — AB (ref 65–99)
Potassium: 3.8 mmol/L (ref 3.5–5.1)
Sodium: 140 mmol/L (ref 135–145)

## 2016-05-15 MED ORDER — LORAZEPAM 2 MG/ML IJ SOLN
1.0000 mg | Freq: Once | INTRAMUSCULAR | Status: AC
  Administered 2016-05-15: 1 mg via INTRAVENOUS
  Filled 2016-05-15: qty 1

## 2016-05-15 NOTE — ED Triage Notes (Signed)
Pt presents to ED after he had a syncopal episode just prior to arrival while getting clothes out of the washing machine. Pt states he has felt a fluttering on the left side of his chest all day. Denies chest pain or sob today. No dizziness currently. Pt alert and calm with no increased work of breathing or distress noted. Skin warm and dry.

## 2016-05-16 ENCOUNTER — Observation Stay (HOSPITAL_COMMUNITY)
Admit: 2016-05-16 | Discharge: 2016-05-16 | Disposition: A | Discharge status: Home / Self Care | Payer: 59 | Attending: Internal Medicine | Admitting: Internal Medicine

## 2016-05-16 ENCOUNTER — Observation Stay: Payer: 59

## 2016-05-16 ENCOUNTER — Encounter: Payer: Self-pay | Admitting: Internal Medicine

## 2016-05-16 DIAGNOSIS — Z91013 Allergy to seafood: Secondary | ICD-10-CM | POA: Diagnosis not present

## 2016-05-16 DIAGNOSIS — I472 Ventricular tachycardia, unspecified: Secondary | ICD-10-CM

## 2016-05-16 DIAGNOSIS — I459 Conduction disorder, unspecified: Secondary | ICD-10-CM

## 2016-05-16 DIAGNOSIS — Z8249 Family history of ischemic heart disease and other diseases of the circulatory system: Secondary | ICD-10-CM | POA: Diagnosis not present

## 2016-05-16 DIAGNOSIS — Z952 Presence of prosthetic heart valve: Secondary | ICD-10-CM

## 2016-05-16 DIAGNOSIS — I252 Old myocardial infarction: Secondary | ICD-10-CM | POA: Diagnosis not present

## 2016-05-16 DIAGNOSIS — Z79899 Other long term (current) drug therapy: Secondary | ICD-10-CM | POA: Diagnosis not present

## 2016-05-16 DIAGNOSIS — R55 Syncope and collapse: Secondary | ICD-10-CM

## 2016-05-16 DIAGNOSIS — E669 Obesity, unspecified: Secondary | ICD-10-CM | POA: Diagnosis present

## 2016-05-16 DIAGNOSIS — Z6830 Body mass index (BMI) 30.0-30.9, adult: Secondary | ICD-10-CM | POA: Diagnosis not present

## 2016-05-16 DIAGNOSIS — I10 Essential (primary) hypertension: Secondary | ICD-10-CM | POA: Diagnosis present

## 2016-05-16 DIAGNOSIS — Z953 Presence of xenogenic heart valve: Secondary | ICD-10-CM | POA: Diagnosis not present

## 2016-05-16 DIAGNOSIS — H9193 Unspecified hearing loss, bilateral: Secondary | ICD-10-CM | POA: Diagnosis present

## 2016-05-16 DIAGNOSIS — F419 Anxiety disorder, unspecified: Secondary | ICD-10-CM | POA: Diagnosis present

## 2016-05-16 DIAGNOSIS — I429 Cardiomyopathy, unspecified: Secondary | ICD-10-CM

## 2016-05-16 DIAGNOSIS — Z8774 Personal history of (corrected) congenital malformations of heart and circulatory system: Secondary | ICD-10-CM | POA: Diagnosis not present

## 2016-05-16 DIAGNOSIS — Z95810 Presence of automatic (implantable) cardiac defibrillator: Secondary | ICD-10-CM | POA: Diagnosis not present

## 2016-05-16 LAB — URINALYSIS, COMPLETE (UACMP) WITH MICROSCOPIC
Bacteria, UA: NONE SEEN
Bilirubin Urine: NEGATIVE
Glucose, UA: NEGATIVE mg/dL
Hgb urine dipstick: NEGATIVE
KETONES UR: NEGATIVE mg/dL
Leukocytes, UA: NEGATIVE
Nitrite: NEGATIVE
PH: 6 (ref 5.0–8.0)
Protein, ur: NEGATIVE mg/dL
RBC / HPF: NONE SEEN RBC/hpf (ref 0–5)
SQUAMOUS EPITHELIAL / LPF: NONE SEEN
Specific Gravity, Urine: 1.014 (ref 1.005–1.030)

## 2016-05-16 LAB — BASIC METABOLIC PANEL
ANION GAP: 4 — AB (ref 5–15)
BUN: 15 mg/dL (ref 6–20)
CHLORIDE: 107 mmol/L (ref 101–111)
CO2: 29 mmol/L (ref 22–32)
Calcium: 9.2 mg/dL (ref 8.9–10.3)
Creatinine, Ser: 1.1 mg/dL (ref 0.61–1.24)
GFR calc Af Amer: >60 mL/min (ref >60)
GFR calc non Af Amer: >60 mL/min (ref >60)
Glucose, Bld: 94 mg/dL (ref 65–99)
POTASSIUM: 4 mmol/L (ref 3.5–5.1)
Sodium: 140 mmol/L (ref 135–145)

## 2016-05-16 LAB — ECHOCARDIOGRAM COMPLETE
Height: 70 in
Weight: 3430.4 oz

## 2016-05-16 LAB — TSH: TSH: 2.388 u[IU]/mL (ref 0.350–4.500)

## 2016-05-16 LAB — MAGNESIUM: Magnesium: 2.1 mg/dL (ref 1.7–2.4)

## 2016-05-16 LAB — TROPONIN I
Troponin I: <0.03 ng/mL (ref <0.03)
Troponin I: <0.03 ng/mL (ref <0.03)
Troponin I: <0.03 ng/mL (ref <0.03)

## 2016-05-16 MED ORDER — SODIUM CHLORIDE 0.9% FLUSH
3.0000 mL | Freq: Two times a day (BID) | INTRAVENOUS | Status: DC
  Administered 2016-05-16 – 2016-05-17 (×3): 3 mL via INTRAVENOUS

## 2016-05-16 MED ORDER — MAGNESIUM OXIDE 400 (241.3 MG) MG PO TABS
400.0000 mg | ORAL_TABLET | Freq: Two times a day (BID) | ORAL | Status: DC
  Administered 2016-05-16 – 2016-05-17 (×2): 400 mg via ORAL
  Filled 2016-05-16 (×2): qty 1

## 2016-05-16 MED ORDER — FLUTICASONE PROPIONATE 50 MCG/ACT NA SUSP
2.0000 | Freq: Every day | NASAL | Status: DC
  Filled 2016-05-16: qty 16

## 2016-05-16 MED ORDER — ONDANSETRON HCL 4 MG PO TABS: 4.0000 mg | ORAL_TABLET | Freq: Four times a day (QID) | ORAL | Status: DC | PRN

## 2016-05-16 MED ORDER — ASPIRIN EC 81 MG PO TBEC
81.0000 mg | DELAYED_RELEASE_TABLET | Freq: Every day | ORAL | Status: DC
  Filled 2016-05-16 (×2): qty 1

## 2016-05-16 MED ORDER — ENOXAPARIN SODIUM 40 MG/0.4ML ~~LOC~~ SOLN: 40.0000 mg | SUBCUTANEOUS | Status: DC

## 2016-05-16 MED ORDER — ACETAMINOPHEN 650 MG RE SUPP
650.0000 mg | Freq: Four times a day (QID) | RECTAL | Status: DC | PRN
Start: 2016-05-16 — End: 2016-05-17

## 2016-05-16 MED ORDER — IPRATROPIUM BROMIDE 0.02 % IN SOLN: 0.5000 mg | Freq: Four times a day (QID) | RESPIRATORY_TRACT | Status: DC | PRN

## 2016-05-16 MED ORDER — ACETAMINOPHEN 325 MG PO TABS: 650.0000 mg | ORAL_TABLET | Freq: Four times a day (QID) | ORAL | Status: DC | PRN

## 2016-05-16 MED ORDER — RAMIPRIL 10 MG PO CAPS
10.0000 mg | ORAL_CAPSULE | Freq: Every day | ORAL | Status: DC
  Administered 2016-05-16 – 2016-05-17 (×2): 10 mg via ORAL
  Filled 2016-05-16 (×2): qty 1

## 2016-05-16 MED ORDER — CARVEDILOL 25 MG PO TABS
25.0000 mg | ORAL_TABLET | Freq: Two times a day (BID) | ORAL | Status: DC
  Administered 2016-05-16 – 2016-05-17 (×3): 25 mg via ORAL
  Filled 2016-05-16 (×3): qty 1

## 2016-05-16 MED ORDER — RANOLAZINE ER 500 MG PO TB12
500.0000 mg | ORAL_TABLET | Freq: Two times a day (BID) | ORAL | Status: DC
  Administered 2016-05-16 – 2016-05-17 (×2): 500 mg via ORAL
  Filled 2016-05-16 (×2): qty 1

## 2016-05-16 MED ORDER — IPRATROPIUM BROMIDE HFA 17 MCG/ACT IN AERS: 2.0000 | INHALATION_SPRAY | Freq: Four times a day (QID) | RESPIRATORY_TRACT | Status: DC | PRN

## 2016-05-16 MED ORDER — FLUOXETINE HCL 20 MG PO CAPS
20.0000 mg | ORAL_CAPSULE | Freq: Every day | ORAL | Status: DC
  Administered 2016-05-16 – 2016-05-17 (×2): 20 mg via ORAL
  Filled 2016-05-16 (×2): qty 1

## 2016-05-16 MED ORDER — ONDANSETRON HCL 4 MG/2ML IJ SOLN: 4.0000 mg | Freq: Four times a day (QID) | INTRAMUSCULAR | Status: DC | PRN

## 2016-05-16 NOTE — H&P (Signed)
[place] [facility] [place] NAME: [name]    MR#:  [id number]  DATE OF BIRTH:  03/23/1976  DATE OF ADMISSION:  05/15/2016  PRIMARY CARE PHYSICIAN: Enid Derry, MD   REQUESTING/REFERRING PHYSICIAN:   CHIEF COMPLAINT:   Chief Complaint  Patient presents with  . Loss of Consciousness    HISTORY OF PRESENT ILLNESS: [name]  is a 40 y.o. male with a known history of hypertension, intermittent bowel disease, ICD placement, cardiomyopathy, ventricular tachycardia presented to the emergency room after he blacked out at home yesterday evening. Patient was putting the detergent in the laundry machine he felt very weak and did not feel well. He sat down on the floor and then blacked out for a brief moment of time. Patient's wife was at home and help him out after he passed out. No complaints of any chest pain. No complaints of any shortness of breath or orthopnea. Patient was evaluated in the emergency room his defibrillator could not be interrogated. First set of troponin was negative. Before passing out patient says he yelled out loudly. Hospitalist service was consulted for further care of the patient.  PAST MEDICAL HISTORY:   Past Medical History:  Diagnosis Date  . Anxiety   . Aortic valve replaced    Requiring replacement, specifics not available  . Hearing loss of both ears 12/22/2015  . Heart murmur   . Hypertension   . IBD (inflammatory bowel disease) 09/23/2015  . ICD (implantable cardiac defibrillator), BSX single   . Obesity 12/22/2015  . Secondary cardiomyopathy ([place])   . Syncope   . Ventricular septal defect   . Ventricular tachycardia ([place])    appropriate VT shock therapy /13    PAST SURGICAL HISTORY: Past Surgical History:  Procedure Laterality Date  . ANGIOPLASTY     RCA repair with vein angioplasty following injury with the aforementioned surgery  . AORTIC VALVE REPLACEMENT     Bioprosthesis  . CARDIAC DEFIBRILLATOR  PLACEMENT    . CARDIAC VALVE REPLACEMENT    . CHOLECYSTECTOMY    . COLONOSCOPY WITH PROPOFOL N/A 12/06/2015   Procedure: COLONOSCOPY WITH PROPOFOL;  Surgeon: [name], MD;  Location: [facility] ENDOSCOPY;  Service: Endoscopy;  Laterality: N/A;  . CORONARY ARTERY BYPASS GRAFT    . IMPLANTABLE CARDIOVERTER DEFIBRILLATOR GENERATOR CHANGE N/A 05/05/2013   Procedure: IMPLANTABLE CARDIOVERTER DEFIBRILLATOR GENERATOR CHANGE;  Surgeon: Deboraha Sprang, MD;  Location: [facility] CATH LAB;  Service: Cardiovascular;  Laterality: N/A;  . INSERT / REPLACE / REMOVE PACEMAKER    . VSD REPAIR      SOCIAL HISTORY:  Social History  Substance Use Topics  . Smoking status: Never Smoker  . Smokeless tobacco: Never Used  . Alcohol use No    FAMILY HISTORY:  Family History  Problem Relation Age of Onset  . Heart disease Father     DRUG ALLERGIES:  Allergies  Allergen Reactions  . Shellfish Allergy Nausea And Vomiting    REVIEW OF SYSTEMS:   CONSTITUTIONAL: No fever, fatigue or weakness.  EYES: No blurred or double vision.  EARS, NOSE, AND THROAT: No tinnitus or ear pain.  RESPIRATORY: No cough, shortness of breath, wheezing or hemoptysis.  CARDIOVASCULAR: No chest pain, orthopnea, edema.  GASTROINTESTINAL: No nausea, vomiting, diarrhea or abdominal pain.  GENITOURINARY: No dysuria, hematuria.  ENDOCRINE: No polyuria, nocturia,  HEMATOLOGY: No anemia, easy bruising or bleeding SKIN: No rash or lesion. MUSCULOSKELETAL: No joint pain or arthritis.   NEUROLOGIC:  No tingling, numbness, weakness.  Passed out yesterday PSYCHIATRY: No anxiety or depression.   MEDICATIONS AT HOME:  Prior to Admission medications   Medication Sig Start Date End Date Taking? Authorizing Provider  ATROVENT HFA 17 MCG/ACT inhaler Inhale 2 puffs into the lungs 4 (four) times daily as needed. 04/09/16  Yes Historical Provider, MD  carvedilol (COREG) 25 MG tablet Take 1 tablet (25 mg total) by mouth 2 (two) times daily with a meal.  09/03/15  Yes Bhavinkumar Bhagat, PA  FLUoxetine (PROZAC) 20 MG capsule Take 1 capsule (20 mg total) by mouth daily. 03/25/16  Yes Arnetha Courser, MD  fluticasone (FLONASE) 50 MCG/ACT nasal spray Place 2 sprays into both nostrils daily. 08/17/15  Yes Steele Sizer, MD  ramipril (ALTACE) 10 MG capsule Take 10 mg by mouth daily. 06/20/15  Yes Historical Provider, MD  loratadine (CLARITIN) 5 MG chewable tablet Chew 5 mg by mouth daily as needed for allergies.    Historical Provider, MD      PHYSICAL EXAMINATION:   VITAL SIGNS: Blood pressure (!) 132/92, pulse 81, temperature 97.8 F (36.6 C), temperature source Oral, resp. rate 19, height 5\' 10"  (1.778 m), weight 102.1 kg (225 lb), SpO2 97 %.  GENERAL:  40 y.o.-year-old well built male patient lying in the bed with no acute distress.  EYES: Pupils equal, round, reactive to light and accommodation. No scleral icterus. Extraocular muscles intact.  HEENT: Head atraumatic, normocephalic. Oropharynx and nasopharynx clear.  NECK:  Supple, no jugular venous distention. No thyroid enlargement, no tenderness.  LUNGS: Normal breath sounds bilaterally, no wheezing, rales,rhonchi or crepitation. No use of accessory muscles of respiration.  Midline scar noted CARDIOVASCULAR: S1, S2 normal. No murmurs, rubs, or gallops.  ABDOMEN: Soft, nontender, nondistended. Bowel sounds present. No organomegaly or mass.  EXTREMITIES: No pedal edema, cyanosis, or clubbing.  NEUROLOGIC: Cranial nerves II through XII are intact. Muscle strength 5/5 in all extremities. Sensation intact. Gait not checked.  PSYCHIATRIC: The patient is alert and oriented x 3.  SKIN: No obvious rash, lesion, or ulcer.   LABORATORY PANEL:   CBC  Recent Labs Lab 05/15/16 2336  WBC 6.4  HGB 14.9  HCT 43.9  PLT 134*  MCV 86.4  MCH 29.3  MCHC 33.8  RDW 12.5   ------------------------------------------------------------------------------------------------------------------  Chemistries    Recent Labs Lab 05/15/16 2336  NA 140  K 3.8  CL 104  CO2 30  GLUCOSE 135*  BUN 16  CREATININE 1.20  CALCIUM 9.1   ------------------------------------------------------------------------------------------------------------------ estimated creatinine clearance is 96 mL/min (by C-G formula based on SCr of 1.2 mg/dL). ------------------------------------------------------------------------------------------------------------------ No results for input(s): TSH, T4TOTAL, T3FREE, THYROIDAB in the last 72 hours.  Invalid input(s): FREET3   Coagulation profile No results for input(s): INR, PROTIME in the last 168 hours. ------------------------------------------------------------------------------------------------------------------- No results for input(s): DDIMER in the last 72 hours. -------------------------------------------------------------------------------------------------------------------  Cardiac Enzymes  Recent Labs Lab 05/15/16 2336  TROPONINI <0.03   ------------------------------------------------------------------------------------------------------------------ Invalid input(s): POCBNP  ---------------------------------------------------------------------------------------------------------------  Urinalysis    Component Value Date/Time   COLORURINE YELLOW (A) 05/15/2016 2336   APPEARANCEUR CLEAR (A) 05/15/2016 2336   APPEARANCEUR Clear 09/15/2015 1525   LABSPEC 1.014 05/15/2016 2336   PHURINE 6.0 05/15/2016 2336   GLUCOSEU NEGATIVE 05/15/2016 2336   HGBUR NEGATIVE 05/15/2016 2336   BILIRUBINUR NEGATIVE 05/15/2016 2336   BILIRUBINUR Negative 09/15/2015 [place] 05/15/2016 2336   PROTEINUR NEGATIVE 05/15/2016 2336   NITRITE NEGATIVE 05/15/2016 2336   LEUKOCYTESUR NEGATIVE 05/15/2016 2336  LEUKOCYTESUR Negative 09/15/2015 1525     RADIOLOGY: No results found.  EKG: Orders placed or performed during the hospital encounter of  05/15/16  . EKG 12-Lead  . EKG 12-Lead  . ED EKG  . ED EKG    IMPRESSION AND PLAN: 40 year old male patient with history of cardiomyopathy, defibrillator, hypertension, ventricular tachycardia presented to the emergency room after he passed out. Admitting diagnosis 1. Syncope 2. Cardiomyopathy 3. Hypertension 4. History of ventricular arrhythmia Treatment plan Admit patient to telemetry observation bed Check troponin to rule out ischemia Cardiology consultation Defibrillator interrogation Resume cardiac medications DVT prophylaxis with subcutaneous Lovenox 40 MG daily Telemetry monitoring.  All the records are reviewed and case discussed with ED provider. Management plans discussed with the patient, family and they are in agreement.  CODE STATUS:FULL CODE Code Status History    Date Active Date Inactive Code Status Order ID Comments User Context   05/05/2013  3:01 PM 05/05/2013  9:05 PM Full Code [id number]  Deboraha Sprang, MD Inpatient       TOTAL TIME TAKING CARE OF THIS PATIENT: 50 minutes.    Saundra Shelling M.D on 05/16/2016 at 3:19 AM  Between 7am to 6pm - Pager - [phone]  After 6pm go to [email or web address] - password [email or web address] [facility]  Office  [phone]  CC: Primary care physician; Enid Derry, MD

## 2016-05-16 NOTE — ED Notes (Signed)
Pt transported to room 236A

## 2016-05-16 NOTE — ED Notes (Signed)
Tried to interrogate pacemaker with all 3 machines. 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 MD made aware that nurse was unable to interrogate with machines in the Ed.

## 2016-05-16 NOTE — Progress Notes (Signed)
*  PRELIMINARY RESULTS* Echocardiogram 2D Echocardiogram has been performed.  [name] 05/16/2016, 2:37 PM

## 2016-05-16 NOTE — Consult Note (Signed)
ELECTROPHYSIOLOGY CONSULT NOTE  Patient ID: [name], MRN: [id number], DOB/AGE: [birth year] 40 y.o. Admit date: 05/15/2016 Date of Consult: 05/16/2016  Primary Physician: Enid Derry, MD Primary Cardiologist: SK    Chief Complaint: Syncope and WCT   HPI [name] is a 40 y.o. male  Admitted with an episode of syncope. He was standing at the washing machine and collapsed with a premonition of just seconds. He woke up moments later. His wife arrange for him to come to the hospital.  Overnight telemetry is notable first SVT mistakenly characterizedventricular tachycardia as it is wide complex  The last few weeks and been notable for significant shortness of breath that occurred in the context of a "viral illness"which is now largely resolved. It was unaccompanied by edema chest pain or nocturnal dyspnea.  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 Ejection fraction at that time was 30%.. He had a history of appropriate VT therapy 2013. He has a history of ischemic congenital heart disease which we presume is a VSD repair because compartment of aortic valve replacement thought potentially due to disrupted leaflets from a membranous VSD. He has a history of modest cardiomyopathy with ejection fraction of 35-40%        Past Medical History:  Diagnosis Date  . Anxiety   . Aortic valve replaced    Requiring replacement, specifics not available  . Hearing loss of both ears 12/22/2015  . Heart murmur   . Hypertension   . IBD (inflammatory bowel disease) 09/23/2015  . ICD (implantable cardiac  defibrillator), BSX single   . Obesity 12/22/2015  . Secondary cardiomyopathy ([place])   . Syncope   . Ventricular septal defect   . Ventricular tachycardia ([place])    appropriate VT shock therapy /13      Surgical History:  Past Surgical History:  Procedure Laterality Date  . ANGIOPLASTY     RCA repair with vein angioplasty following injury with the aforementioned surgery  . AORTIC VALVE REPLACEMENT     Bioprosthesis  . CARDIAC DEFIBRILLATOR PLACEMENT    . CARDIAC VALVE REPLACEMENT    . CHOLECYSTECTOMY    . COLONOSCOPY WITH PROPOFOL N/A 12/06/2015   Procedure: COLONOSCOPY WITH PROPOFOL;  Surgeon: [name], MD;  Location: [facility] ENDOSCOPY;  Service: Endoscopy;  Laterality: N/A;  . CORONARY ARTERY BYPASS GRAFT    . IMPLANTABLE CARDIOVERTER DEFIBRILLATOR GENERATOR CHANGE N/A 05/05/2013   Procedure: IMPLANTABLE CARDIOVERTER DEFIBRILLATOR GENERATOR CHANGE;  Surgeon: Deboraha Sprang, MD;  [facility] CATH LAB;  Service: Cardiovascular;  Laterality: N/A;  . INSERT / REPLACE / REMOVE PACEMAKER    . VSD REPAIR       Home Meds: Prior to Admission medications   Medication Sig Start Date End Date Taking? Authorizing Provider  ATROVENT HFA 17 MCG/ACT inhaler Inhale 2 puffs into the lungs 4 (four) times daily as needed. 04/09/16  Yes Historical Provider, MD  carvedilol (COREG) 25 MG tablet Take 1 tablet (25 mg total) by mouth 2 (two) times daily with a meal. 09/03/15  Yes Bhavinkumar Bhagat, PA  FLUoxetine (PROZAC) 20 MG capsule Take 1 capsule (20 mg total)  by mouth daily. 03/25/16  Yes Arnetha Courser, MD  fluticasone (FLONASE) 50 MCG/ACT nasal spray Place 2 sprays into both nostrils daily. 08/17/15  Yes Steele Sizer, MD  ramipril (ALTACE) 10 MG capsule Take 10 mg by mouth daily. 06/20/15  Yes Historical Provider, MD  loratadine (CLARITIN) 5 MG chewable tablet Chew 5 mg by mouth daily as needed for allergies.    Historical Provider, MD    Inpatient Medications:  . aspirin EC  81 mg Oral Daily  .  carvedilol  25 mg Oral BID WC  . enoxaparin (LOVENOX) injection  40 mg Subcutaneous Q24H  . FLUoxetine  20 mg Oral Daily  . fluticasone  2 spray Each Nare Daily  . ramipril  10 mg Oral Daily  . sodium chloride flush  3 mL Intravenous Q12H     Allergies:  Allergies  Allergen Reactions  . Shellfish Allergy Nausea And Vomiting    Social History   Social History  . Marital status: Married    Spouse name: N/A  . Number of children: N/A  . Years of education: N/A   Occupational History  . 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 Smoking status: Never Smoker  . Smokeless tobacco: Never Used  . Alcohol use No  . Drug use: No  . Sexual activity: Not on file   Other Topics Concern  . Not on file   Social History Narrative   Married     Family History  Problem Relation Age of Onset  . Heart disease Father      ROS:  Please see the history of present illness.     All other systems reviewed and negative.    Physical Exam: Blood pressure 114/66, pulse 78, temperature 98.1 F (36.7 C), temperature source Oral, resp. rate 17, height 5\' 10"  (1.778 m), weight 214 lb 6.4 oz (97.3 kg), SpO2 96 %. General: Well developed, well nourished male in no acute distress. Head: Normocephalic, atraumatic, sclera non-icteric, no xanthomas, nares are without discharge. EENT: normal Lymph Nodes:  none Back: without scoliosis/kyphosis , no CVA tendersness Neck: Negative for carotid bruits. JVD 5-6 cm Lungs: Clear bilaterally to auscultation without wheezes, rales, or rhonchi. Breathing is unlabored. Heart: RRR with S1 S2.  2/6 systolic and 2/6 diastolic murmur , rubs, or gallops appreciated. Abdomen: Soft, non-tender, non-distended with normoactive bowel sounds. No hepatomegaly. No rebound/guarding. No obvious abdominal masses. Msk:  Strength and tone appear normal for age. Extremities: No clubbing or cyanosis.tr * edema.  Distal pedal pulses are 2+ and equal  bilaterally. Skin: Warm and Dry Neuro: Alert and oriented X 3. CN III-XII intact Grossly normal sensory and motor function . Psych:  Responds to questions appropriately with a normal affect.      Labs: Cardiac Enzymes  Recent Labs  05/15/16 2336 05/16/16 0428 05/16/16 1006  TROPONINI <0.03 <0.03 <0.03   CBC Lab Results  Component Value Date   WBC 6.4 05/15/2016   HGB 14.9 05/15/2016   HCT 43.9 05/15/2016   MCV 86.4 05/15/2016   PLT 134 (L) 05/15/2016   PROTIME: No results for input(s): LABPROT, INR in the last 72 hours. Chemistry  Recent Labs Lab 05/16/16 0428  NA 140  K 4.0  CL 107  CO2 29  BUN 15  CREATININE 1.10  CALCIUM 9.2  GLUCOSE 94   Lipids Lab Results  Component Value Date   CHOL 174 12/22/2015  HDL 43 12/22/2015   LDLCALC 115 (H) 12/22/2015   TRIG 82 12/22/2015   BNP No results found for: PROBNP Thyroid Function Tests:  Recent Labs  05/16/16 1006  TSH 2.388      Miscellaneous No results found for: DDIMER  Radiology/Studies:  US Venous Img Lower Bilateral  Result Date: 05/16/2016 CLINICAL DATA:  Bilateral calf pain.  Evaluate for DVT. EXAM: BILATERAL LOWER EXTREMITY VENOUS DOPPLER ULTRASOUND TECHNIQUE: Gray-scale sonography with graded compression, as well as color Doppler and duplex ultrasound were performed to evaluate the lower extremity deep venous systems from the level of the common femoral vein and including the common femoral, femoral, profunda femoral, popliteal and calf veins including the posterior tibial, peroneal and gastrocnemius veins when visible. The superficial great saphenous vein was also interrogated. Spectral Doppler was utilized to evaluate flow at rest and with distal augmentation maneuvers in the common femoral, femoral and popliteal veins. COMPARISON:  None. FINDINGS: RIGHT LOWER EXTREMITY Common Femoral Vein: No evidence of thrombus. Normal compressibility, respiratory phasicity and response to augmentation.  Saphenofemoral Junction: No evidence of thrombus. Normal compressibility and flow on color Doppler imaging. Profunda Femoral Vein: No evidence of thrombus. Normal compressibility and flow on color Doppler imaging. Femoral Vein: No evidence of thrombus. Normal compressibility, respiratory phasicity and response to augmentation. Popliteal Vein: No evidence of thrombus. Normal compressibility, respiratory phasicity and response to augmentation. Calf Veins: No evidence of thrombus. Normal compressibility and flow on color Doppler imaging. Superficial Great Saphenous Vein: No evidence of thrombus. Normal compressibility and flow on color Doppler imaging. Venous Reflux:  None. Other Findings:  None. LEFT LOWER EXTREMITY Common Femoral Vein: No evidence of thrombus. Normal compressibility, respiratory phasicity and response to augmentation. Saphenofemoral Junction: No evidence of thrombus. Normal compressibility and flow on color Doppler imaging. Profunda Femoral Vein: No evidence of thrombus. Normal compressibility and flow on color Doppler imaging. Femoral Vein: No evidence of thrombus. Normal compressibility, respiratory phasicity and response to augmentation. Popliteal Vein: No evidence of thrombus. Normal compressibility, respiratory phasicity and response to augmentation. Calf Veins: No evidence of thrombus. Normal compressibility and flow on color Doppler imaging. Superficial Great Saphenous Vein: No evidence of thrombus. Normal compressibility and flow on color Doppler imaging. Venous Reflux:  None. Other Findings:  None. IMPRESSION: No evidence DVT within either lower extremity. Electronically Signed   By: Sandi Mariscal M.D.   On: 05/16/2016 11:15    EKG:  Sinus rhythm at 77. Intervals 18/12/43 Frequent premature beats with a QRS morphology very similar to but mildly wider suggesting a junctional origin i.e. PJCs. They could be septal PVCs.  Telemetry Personally reviewed  ` wide complex tachycardia event  described in the notes as a QRS morphology almost identical to the antecedent beats with P waves notable in the ST segment. Hence, I think it is SVT not VT.  His defibrillator was interrogated. Inappropriately detected and treated polymorphic ventricular tachycardia that was not long short coupled   he has had a number of episodes over the last couple of weeks of short polymorphic VT  Assessment and Plan:   VT-PM With Syncope  NICM  EF 35-40%    Myocardial Infarction--presumably related to RCA damage at the time of aortic outflow placement  VSD repair  SVT-nonsustained  ICD-[employer]   The patient had polymorphic ventricular tachycardia that was associated with syncope. It was appropriately detected and treated by his device. The telemetry strips are more consistent with SVT.  There has been a a number of  polymorphic VT episodes over the last couple of weeks occurring in the wake of viral illness that afflicted him around Thanksgiving and left him very short of breath. It is possible that there were some inflammatory irritability of his heart. If he had myocarditis that presumably was rather mild as there is no significant troponin elevation and no significant deterioration in LV systolic function.  The normal troponins also make it unlikely that there is a strong ischemic component, not specifically of arrhythmic events, but of arrhythmic events in the context of his dyspnea that developed after Thanksgiving.  I discussed antiarrhythmic drug options with the patient and his wife (formal cardiac nurse)--and at this juncture would like to avoid them.  Hence, we will continue his carvedilol  We will add magnesium and ranolazine         Virl Axe

## 2016-05-16 NOTE — Progress Notes (Signed)
Patient ID: [name], male   DOB: 30-Oct-1975, 40 y.o.   MRN: [id number]  [facility] PROGRESS NOTE  [name] DOB: 04-11-1976 DOA: 05/15/2016 PCP: Enid Derry, MD  HPI/Subjective: Patient came in with palpitations and syncope. Found to have ventricular tachycardia that was sustained. His defibrillator did not sense it because of ventricular tachycardia was slow.  The patient had a respiratory cold around Thanksgiving.  Objective: Vitals:   05/16/16 0735 05/16/16 1400  BP: 126/71 114/66  Pulse: 66 78  Resp: 18 17  Temp:  98.1 F (36.7 C)    Filed Weights   05/15/16 2313 05/16/16 0422  Weight: 102.1 kg (225 lb) 97.3 kg (214 lb 6.4 oz)    ROS: Review of Systems  Constitutional: Negative for chills and fever.  Eyes: Negative for blurred vision.  Respiratory: Negative for cough and shortness of breath.   Cardiovascular: Negative for chest pain.  Gastrointestinal: Negative for abdominal pain, constipation, diarrhea, nausea and vomiting.  Genitourinary: Negative for dysuria.  Musculoskeletal: Negative for joint pain.  Neurological: Negative for dizziness and headaches.   Exam: Physical Exam  Constitutional: He is oriented to person, place, and time.  HENT:  Nose: No mucosal edema.  Mouth/Throat: No oropharyngeal exudate or posterior oropharyngeal edema.  Eyes: Conjunctivae, EOM and lids are normal. Pupils are equal, round, and reactive to light.  Neck: No JVD present. Carotid bruit is not present. No edema present. No thyroid mass and no thyromegaly present.  Cardiovascular: S1 normal and S2 normal.  Exam reveals no gallop.   No murmur heard. Pulses:      Dorsalis pedis pulses are 2+ on the right side, and 2+ on the left side.  Respiratory: No respiratory distress. He has no wheezes. He has no rhonchi. He has no rales.  GI: Soft. Bowel sounds are normal. There is no tenderness.  Musculoskeletal:       Right ankle: He exhibits no swelling.        Left ankle: He exhibits no swelling.  Lymphadenopathy:    He has no cervical adenopathy.  Neurological: He is alert and oriented to person, place, and time. No cranial nerve deficit.  Skin: Skin is warm. No rash noted. Nails show no clubbing.  Psychiatric: He has a normal mood and affect.      Data Reviewed: Basic Metabolic Panel:  Recent Labs Lab 05/15/16 2336 05/16/16 0428  NA 140 140  K 3.8 4.0  CL 104 107  CO2 30 29  GLUCOSE 135* 94  BUN 16 15  CREATININE 1.20 1.10  CALCIUM 9.1 9.2  MG  --  2.1   CBC:  Recent Labs Lab 05/15/16 2336  WBC 6.4  HGB 14.9  HCT 43.9  MCV 86.4  PLT 134*   Cardiac Enzymes:  Recent Labs Lab 05/15/16 2336 05/16/16 0428 05/16/16 1006  TROPONINI <0.03 <0.03 <0.03    Studies: US Venous Img Lower Bilateral  Result Date: 05/16/2016 CLINICAL DATA:  Bilateral calf pain.  Evaluate for DVT. EXAM: BILATERAL LOWER EXTREMITY VENOUS DOPPLER ULTRASOUND TECHNIQUE: Gray-scale sonography with graded compression, as well as color Doppler and duplex ultrasound were performed to evaluate the lower extremity deep venous systems from the level of the common femoral vein and including the common femoral, femoral, profunda femoral, popliteal and calf veins including the posterior tibial, peroneal and gastrocnemius veins when visible. The superficial great saphenous vein was also interrogated. Spectral Doppler was utilized to evaluate flow at rest and with distal augmentation maneuvers  in the common femoral, femoral and popliteal veins. COMPARISON:  None. FINDINGS: RIGHT LOWER EXTREMITY Common Femoral Vein: No evidence of thrombus. Normal compressibility, respiratory phasicity and response to augmentation. Saphenofemoral Junction: No evidence of thrombus. Normal compressibility and flow on color Doppler imaging. Profunda Femoral Vein: No evidence of thrombus. Normal compressibility and flow on color Doppler imaging. Femoral Vein: No evidence of thrombus. Normal  compressibility, respiratory phasicity and response to augmentation. Popliteal Vein: No evidence of thrombus. Normal compressibility, respiratory phasicity and response to augmentation. Calf Veins: No evidence of thrombus. Normal compressibility and flow on color Doppler imaging. Superficial Great Saphenous Vein: No evidence of thrombus. Normal compressibility and flow on color Doppler imaging. Venous Reflux:  None. Other Findings:  None. LEFT LOWER EXTREMITY Common Femoral Vein: No evidence of thrombus. Normal compressibility, respiratory phasicity and response to augmentation. Saphenofemoral Junction: No evidence of thrombus. Normal compressibility and flow on color Doppler imaging. Profunda Femoral Vein: No evidence of thrombus. Normal compressibility and flow on color Doppler imaging. Femoral Vein: No evidence of thrombus. Normal compressibility, respiratory phasicity and response to augmentation. Popliteal Vein: No evidence of thrombus. Normal compressibility, respiratory phasicity and response to augmentation. Calf Veins: No evidence of thrombus. Normal compressibility and flow on color Doppler imaging. Superficial Great Saphenous Vein: No evidence of thrombus. Normal compressibility and flow on color Doppler imaging. Venous Reflux:  None. Other Findings:  None. IMPRESSION: No evidence DVT within either lower extremity. Electronically Signed   By: Sandi Mariscal M.D.   On: 05/16/2016 11:15    Scheduled Meds: . aspirin EC  81 mg Oral Daily  . carvedilol  25 mg Oral BID WC  . enoxaparin (LOVENOX) injection  40 mg Subcutaneous Q24H  . FLUoxetine  20 mg Oral Daily  . fluticasone  2 spray Each Nare Daily  . ramipril  10 mg Oral Daily  . sodium chloride flush  3 mL Intravenous Q12H   Assessment/Plan:  1. Sustained ventricular tachycardia. Appreciate cardiology consult and they discussed case with Dr. Caryl Comes cardiology EP specialist to see the patient. Patient may end up needing his defibrillator to have  settings changed to sense this ventricular tachycardia. May end up needing antiarrhythmic medication. Electrolytes okay. Echocardiogram pending but last echocardiogram showed a good ejection fraction. Patient is on Coreg and ramipril 2. history of cardiomyopathy on Coreg and ramipril 3. Essential hypertension on Coreg and enalapril  Code Status:     Code Status Orders        Start     Ordered   05/16/16 0417  Full code  Continuous     05/16/16 0416    Code Status History    Date Active Date Inactive Code Status Order ID Comments User Context   05/05/2013  3:01 PM 05/05/2013  9:05 PM Full Code [id number]  Deboraha Sprang, MD Inpatient     Family Communication: Spoke with family at bedside Disposition Plan: Home in the next day or so  Consultants:  Cardiology  Time spent: 35 minutes  Loletha Grayer  [employer]

## 2016-05-16 NOTE — Consult Note (Signed)
Cardiology Consultation Note  Patient ID: [name], MRN: [id number], DOB/AGE: 22-Nov-1975 40 y.o. Admit date: 05/15/2016   Date of Consult: 05/16/2016 Primary Physician: Enid Derry, MD Primary Cardiologist: Dr. Caryl Comes, MD Requesting Physician: Dr. Estanislado Pandy, MD  Chief Complaint: Syncope Reason for Consult: VT associated syncope  HPI: 40 y.o. 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 on 05/15/2016 with an episode of syncope on 12/26 while doing the laundry.   Previous echo in 07/2011 demonstrated LV dysfunction with an EF of 40-45% with inferior posterior akinesis and aortic valve bioprosthesis. There as mild AI. There was concern for possible disruption of the aortic leaflets. Echo in 04/2013 showed mild worsening of EF at 35-40% with questions regarding the aortic valve and possible stenosis within the bioprosthetic valve. TEE was performed in 05/2013 that showed normal LV systolic function with no significant aortic stenosis with well visualized aortic valve opening. Most recent echo from 06/2015 showed an EF of 45-50%, normal wall motion except for the basal inferior and posterior wall which was hypokinetic and appeared aneurysmal. LV diastolic function was normal. Aoritc valve was poorly visualized with mild to moderate AI. PASP normal. Last device interrogation in 06/2015 showed 11 NSVT episodes with the longest being 18 seconds.   Patient has been dealing with URI x 2-3 weeks dating back to late November that has now resolved. For the past 2-3 weeks he has been noticing increase in palpitations that would last less than 1 minute. No shocks. Patient was in his usual state of health  on 12/26 until he was doing the laundry and he reported "just not feeling well." No associated palpitations, chest pain, diaphoresis, nausea, or vomiting. Patient sat on the ground and placed the laundry detergent beside him. The next thing he remembers is waking up with his face on the floor. He reports he was unconscious for "a couple of seconds." He got up, walked to his wife to let her know, and came to the ED.   Upon the patient's arrival to [facility] they were found to have troponin negative x 2, magnesium 2.1, potassium 3.8-->4.0, SCr 1.20-->1.10, hgb 14.9, wbc 6.4, plt 134. ECG as below, telemetry showed sinus rhythm with rates in the 80's bpm. Episode of sustained VT lasting 51 seconds at 4:35 AM, 11 beats of NSVT at 5:25 AM, 5 beats of NSVT at 6:57 AM, 21 beats of NSVT at 8:26 AM, multifocal PVCs. Currently asymptomatic. Echo is pending.    Past Medical History:  Diagnosis Date  . Anxiety   . Aortic valve replaced    Requiring replacement, specifics not available  . Hearing loss of both ears 12/22/2015  . Heart murmur   . Hypertension   . IBD (inflammatory bowel disease) 09/23/2015  . ICD (implantable cardiac defibrillator), BSX single   . Obesity 12/22/2015  . Secondary cardiomyopathy ([place])   . Syncope   . Ventricular septal defect   . Ventricular tachycardia ([place])    appropriate VT shock therapy /13      Most Recent Cardiac Studies: Echo 07/19/2015: Study Conclusions  - Left ventricle: The cavity size was normal. Systolic function was   mildly reduced. The estimated ejection fraction was in the range   of 45% to 50%.  Wall motion was normal, except for the basal   inferior and posterior wall which is hypokinetic and appears   aneuysmal. Left ventricular diastolic function parameters were   normal. - Aortic valve: Poorly visualized. A bioprosthesis was present.   Transvalvular velocity was increased. There was moderate   stenosis. There was mild to moderate regurgitation. Valve  area   (VTI): 1.18 cm^2. - Left atrium: The atrium was mildly dilated. - Right ventricle: Pacer wire or catheter noted in right ventricle.   Systolic function was normal. - Pulmonary arteries: Systolic pressure was within the normal   range.  Device interrogation 07/07/2015: Conclusion   ICD check in clinic. Normal device function. Threshold and sensing consistent with previous device measurements. Impedance trends stable over time. 11 NSVT episodes- longest 18 seconds, EGMs available show true NSVT. Histogram distribution appropriate  for patient and level of activity. No changes made this session. Device programmed at appropriate safety margins. Device programmed to optimize intrinsic conduction. Estimated longevity 12 years. Pt enrolled in remote follow-up. [place] 10/06/15, ROV  with SK in 12 months.     Surgical History:  Past Surgical History:  Procedure Laterality Date  . ANGIOPLASTY     RCA repair with vein angioplasty following injury with the aforementioned surgery  . AORTIC VALVE REPLACEMENT     Bioprosthesis  . CARDIAC DEFIBRILLATOR PLACEMENT    . CARDIAC VALVE REPLACEMENT    . CHOLECYSTECTOMY    . COLONOSCOPY WITH PROPOFOL N/A 12/06/2015   Procedure: COLONOSCOPY WITH PROPOFOL;  Surgeon: [name], MD;  Location: [facility] ENDOSCOPY;  Service: Endoscopy;  Laterality: N/A;  . CORONARY ARTERY BYPASS GRAFT    . IMPLANTABLE CARDIOVERTER DEFIBRILLATOR GENERATOR CHANGE N/A 05/05/2013   Procedure: IMPLANTABLE CARDIOVERTER DEFIBRILLATOR GENERATOR CHANGE;  Surgeon: Deboraha Sprang, MD;  Location: [facility] CATH LAB;  Service: Cardiovascular;  Laterality: N/A;  . INSERT / REPLACE / REMOVE PACEMAKER    . VSD REPAIR       Home Meds: Prior to Admission medications   Medication Sig Start Date End Date Taking? Authorizing Provider  ATROVENT HFA 17 MCG/ACT inhaler Inhale 2 puffs into the lungs 4 (four) times daily as needed. 04/09/16  Yes Historical Provider, MD  carvedilol (COREG) 25 MG tablet  Take 1 tablet (25 mg total) by mouth 2 (two) times daily with a meal. 09/03/15  Yes Bhavinkumar Bhagat, PA  FLUoxetine (PROZAC) 20 MG capsule Take 1 capsule (20 mg total) by mouth daily. 03/25/16  Yes Arnetha Courser, MD  fluticasone (FLONASE) 50 MCG/ACT nasal spray Place 2 sprays into both nostrils daily. 08/17/15  Yes Steele Sizer, MD  ramipril (ALTACE) 10 MG capsule Take 10 mg by mouth daily. 06/20/15  Yes Historical Provider, MD  loratadine (CLARITIN) 5 MG chewable tablet Chew 5 mg by mouth daily as needed for allergies.    Historical Provider, MD    Inpatient Medications:  . aspirin EC  81 mg Oral Daily  . carvedilol  25 mg Oral BID WC  . enoxaparin (LOVENOX) injection  40 mg Subcutaneous Q24H  . FLUoxetine  20 mg Oral Daily  . fluticasone  2 spray Each Nare Daily  . ramipril  10 mg Oral Daily  . sodium chloride flush  3 mL Intravenous Q12H     Allergies:  Allergies  Allergen Reactions  . Shellfish Allergy Nausea And Vomiting    Social History   Social History  . Marital status: Married    Spouse name: N/A  . Number of children: N/A  .  Years of education: N/A   Occupational History  . 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 Smoking status: Never Smoker  . Smokeless tobacco: Never Used  . Alcohol use No  . Drug use: No  . Sexual activity: Not on file   Other Topics Concern  . Not on file   Social History Narrative   Married     Family History  Problem Relation Age of Onset  . Heart disease Father      Review of Systems: Review of Systems  Constitutional: Positive for malaise/fatigue. Negative for chills, diaphoresis, fever and weight loss.  HENT: Negative for congestion.   Eyes: Negative for discharge and redness.  Respiratory: Negative for cough, hemoptysis, sputum production, shortness of breath and wheezing.   Cardiovascular: Positive for palpitations. Negative for chest pain, orthopnea, claudication, leg swelling and  PND.  Gastrointestinal: Negative for abdominal pain, blood in stool, heartburn, melena, nausea and vomiting.  Genitourinary: Negative for hematuria.  Musculoskeletal: Negative for falls and myalgias.  Skin: Negative for rash.  Neurological: Positive for loss of consciousness and weakness. Negative for dizziness, tingling, tremors, sensory change, speech change, focal weakness, seizures and headaches.  Endo/Heme/Allergies: Does not bruise/bleed easily.  Psychiatric/Behavioral: Negative for substance abuse. The patient is not nervous/anxious.   All other systems reviewed and are negative.   Labs:  Recent Labs  05/15/16 2336 05/16/16 0428  TROPONINI <0.03 <0.03   Lab Results  Component Value Date   WBC 6.4 05/15/2016   HGB 14.9 05/15/2016   HCT 43.9 05/15/2016   MCV 86.4 05/15/2016   PLT 134 (L) 05/15/2016    Recent Labs Lab 05/16/16 0428  NA 140  K 4.0  CL 107  CO2 29  BUN 15  CREATININE 1.10  CALCIUM 9.2  GLUCOSE 94   Lab Results  Component Value Date   CHOL 174 12/22/2015   HDL 43 12/22/2015   LDLCALC 115 (H) 12/22/2015   TRIG 82 12/22/2015   No results found for: DDIMER  Radiology/Studies:  No results found.  EKG: Interpreted by me showed: NSR, 77 bpm, LVH, occasional PVCs, nonspecific IVCD, inferior TWI Telemetry: Interpreted by me showed: currently in sinus rhythm with rates in the 80's bpm. Episode of sustained VT lasting 51 seconds at 4:35 AM, 11 beats of NSVT at 5:25 AM, 5 beats of NSVT at 6:57 AM, 21 beats of NSVT at 8:26 AM, multifocal PVCs  Weights: Filed Weights   05/15/16 2313 05/16/16 0422  Weight: 225 lb (102.1 kg) 214 lb 6.4 oz (97.3 kg)     Physical Exam: Blood pressure 126/71, pulse 66, temperature 97.8 F (36.6 C), temperature source Oral, resp. rate 18, height 5\' 10"  (1.778 m), weight 214 lb 6.4 oz (97.3 kg), SpO2 96 %. Body mass index is 30.76 kg/m. General: Well developed, well nourished, in no acute distress. Head: Normocephalic,  atraumatic, sclera non-icteric, no xanthomas, nares are without discharge.  Neck: Negative for carotid bruits. JVD not elevated. Lungs: Clear bilaterally to auscultation without wheezes, rales, or rhonchi. Breathing is unlabored. Heart: RRR with S1 S2. II/VI systolic murmur, no rubs, or gallops appreciated. Abdomen: Obese, soft, non-tender, non-distended with normoactive bowel sounds. No hepatomegaly. No rebound/guarding. No obvious abdominal masses. Msk:  Strength and tone appear normal for age. Extremities: No clubbing or cyanosis. No edema. Distal pedal pulses are 2+ and equal bilaterally. Neuro: Alert and oriented X 3. No facial asymmetry. No focal  deficit. Moves all extremities spontaneously. Psych:  Responds to questions appropriately with a normal affect.    Assessment and Plan:  Principal Problem:   Syncope Active Problems:   Sustained ventricular tachycardia (HCC)   Cardiomyopathy, secondary ([place])   H/O aortic valve replacement    1. Sustained ventricular tachycardia associated syncope: -Has been noting increased palpitations over the past 2-3 weeks with each lasting < 1 minute after having a URI -While adding laundry detergent to the washer on 12/26 patient suddenly "did not feel well" he sat on the ground and placed the laundry detergent beside him. Next thing he remembers is waking up with his face on the floor. He reports LOC for a couple of seconds. He did not feel any palpitations, chest pain, nausea, vomiting, or diaphoresis. He did not feel a shock delivery -On telemetry at [facility] patient with multiple episodes of VT, longest run of 51 seconds at 4:35 AM, followed by 11 beats at 5:25 AM, 5 beats at 6:57 AM, and 21 beats at 8:26 AM. He did not associate these episodes with symptoms -[facility] to have ICD interrogated with possible settings changed as it appears his VT may be running slightly lower than current detection thresholds  -Call into Dr. Caryl Comes to  discuss treatment options of settings as well as possible antiarrhythmic medication -Magnesium and potassium at goal -Recent TSH in August 2017 ok, will check level today -Continue Coreg 25 mg bid -Check echo to evaluate EF and wall motion  -No driving x 6 months   2. History of VT s/p Chemical engineer single lead ICD: -Device interrogation as above -Needs EP follow up  3. Cardiomyopathy 2/2 congenital heart disease s/p repair: -Check echo to evaluate bioprosthetic aortic valve  -Continue Coreg 25 mg bid -Add further medications pending echo if indicated   4. Recent URI: -Improved    Signed, Christell Faith, PA-C [facility] Pager: [phone] 05/16/2016, 9:17 AM

## 2016-05-17 ENCOUNTER — Telehealth: Payer: Self-pay | Admitting: Family Medicine

## 2016-05-17 MED ORDER — MAGNESIUM OXIDE 400 (241.3 MG) MG PO TABS: 400.0000 mg | ORAL_TABLET | Freq: Two times a day (BID) | ORAL | 0 refills | Status: DC

## 2016-05-17 MED ORDER — RANOLAZINE ER 500 MG PO TB12: 500.0000 mg | ORAL_TABLET | Freq: Two times a day (BID) | ORAL | 0 refills | Status: DC

## 2016-05-17 NOTE — Discharge Summary (Signed)
[facility] [place] NAME: [name]    MR#:  [id number]  DATE OF BIRTH:  06/24/1975  DATE OF ADMISSION:  05/15/2016 ADMITTING PHYSICIAN: Saundra Shelling, MD  DATE OF DISCHARGE: 05/17/2016 11:48 AM  PRIMARY CARE PHYSICIAN: Enid Derry, MD    ADMISSION DIAGNOSIS:  Syncopal episode  DISCHARGE DIAGNOSIS:  Principal Problem:   Syncope Active Problems:   Cardiomyopathy, secondary ([place])   H/O aortic valve replacement   Sustained ventricular tachycardia (HCC)   Ventricular tachycardia ([place])   SECONDARY DIAGNOSIS:   Past Medical History:  Diagnosis Date  . Anxiety   . Aortic valve replaced    Requiring replacement, specifics not available  . Hearing loss of both ears 12/22/2015  . Heart murmur   . Hypertension   . IBD (inflammatory bowel disease) 09/23/2015  . ICD (implantable cardiac defibrillator), BSX single   . Obesity 12/22/2015  . Secondary cardiomyopathy ([place])   . Syncope   . Ventricular septal defect   . Ventricular tachycardia ([place])    appropriate VT shock therapy /13    HOSPITAL COURSE:   1. Sustained ventricular tachycardia with syncope. Patient seen by cardiology and EP specialist Dr. Caryl Comes. Patient started on magnesium and Ranexa. Continue dose of Coreg. Follow-up with Dr. Caryl Comes as outpatient. Consideration of antiarrhythmic medications can be done as outpatient. No further arrhythmias seen on telemetry monitoring from when I saw him on 12/27 through the morning of 12/28. Patient felt good with ambulation and was stable for discharge home. 2. Cardiomyopathy on Coreg and ramipril. 3. Essential hypertension on Coreg and ROM are PERRL  DISCHARGE CONDITIONS:   Satisfactory  CONSULTS OBTAINED:  Treatment Team:  Deboraha Sprang, MD Minna Merritts, MD  DRUG ALLERGIES:   Allergies  Allergen Reactions  . Shellfish Allergy Nausea And Vomiting    DISCHARGE MEDICATIONS:   Discharge Medication List as of 05/17/2016  11:26 AM    START taking these medications   Details  magnesium oxide (MAG-OX) 400 (241.3 Mg) MG tablet Take 1 tablet (400 mg total) by mouth 2 (two) times daily., Starting Thu 05/17/2016, Print    ranolazine (RANEXA) 500 MG 12 hr tablet Take 1 tablet (500 mg total) by mouth 2 (two) times daily., Starting Thu 05/17/2016, Print      CONTINUE these medications which have NOT CHANGED   Details  ATROVENT HFA 17 MCG/ACT inhaler Inhale 2 puffs into the lungs 4 (four) times daily as needed., Starting Mon 04/09/2016, Historical Med    carvedilol (COREG) 25 MG tablet Take 1 tablet (25 mg total) by mouth 2 (two) times daily with a meal., Starting Sat 09/03/2015, Normal    FLUoxetine (PROZAC) 20 MG capsule Take 1 capsule (20 mg total) by mouth daily., Starting Sun 03/25/2016, Normal    fluticasone (FLONASE) 50 MCG/ACT nasal spray Place 2 sprays into both nostrils daily., Starting Wed 08/17/2015, Normal    ramipril (ALTACE) 10 MG capsule Take 10 mg by mouth daily., Starting Mon 06/20/2015, Historical Med    loratadine (CLARITIN) 5 MG chewable tablet Chew 5 mg by mouth daily as needed for allergies., Historical Med         DISCHARGE INSTRUCTIONS:   Follow-up with Dr. Caryl Comes cardiology Follow-up with PMD  If you experience worsening of your admission symptoms, develop shortness of breath, life threatening emergency, suicidal or homicidal thoughts you must seek medical attention immediately by calling 911 or calling your MD immediately  if symptoms less severe.  You  Must read complete instructions/literature along with all the possible adverse reactions/side effects for all the Medicines you take and that have been prescribed to you. Take any new Medicines after you have completely understood and accept all the possible adverse reactions/side effects.   Please note  You were cared for by a hospitalist during your hospital stay. If you have any questions about your discharge medications or the care  you received while you were in the hospital after you are discharged, you can call the unit and asked to speak with the hospitalist on call if the hospitalist that took care of you is not available. Once you are discharged, your primary care physician will handle any further medical issues. Please note that NO REFILLS for any discharge medications will be authorized once you are discharged, as it is imperative that you return to your primary care physician (or establish a relationship with a primary care physician if you do not have one) for your aftercare needs so that they can reassess your need for medications and monitor your lab values.    Today   CHIEF COMPLAINT:   Chief Complaint  Patient presents with  . Loss of Consciousness    HISTORY OF PRESENT ILLNESS:  [name]  is a 40 y.o. male with a known history of cardiomyopathy presents with syncope   VITAL SIGNS:  Blood pressure 125/71, pulse 67, temperature 98.6 F (37 C), resp. rate 18, height 5\' 10"  (1.778 m), weight 97.3 kg (214 lb 6.4 oz), SpO2 97 %.    PHYSICAL EXAMINATION:  GENERAL:  40 y.o.-year-old patient lying in the bed with no acute distress.  EYES: Pupils equal, round, reactive to light and accommodation. No scleral icterus. Extraocular muscles intact.  HEENT: Head atraumatic, normocephalic. Oropharynx and nasopharynx clear.  NECK:  Supple, no jugular venous distention. No thyroid enlargement, no tenderness.  LUNGS: Normal breath sounds bilaterally, no wheezing, rales,rhonchi or crepitation. No use of accessory muscles of respiration.  CARDIOVASCULAR: S1, S2 normal. 2/6 systolic murmurs. No rubs, or gallops.  ABDOMEN: Soft, non-tender, non-distended. Bowel sounds present. No organomegaly or mass.  EXTREMITIES: No pedal edema, cyanosis, or clubbing.  NEUROLOGIC: Cranial nerves II through XII are intact. Muscle strength 5/5 in all extremities. Sensation intact. Gait not checked.  PSYCHIATRIC: The patient is alert  and oriented x 3.  SKIN: No obvious rash, lesion, or ulcer.   DATA REVIEW:   CBC  Recent Labs Lab 05/15/16 2336  WBC 6.4  HGB 14.9  HCT 43.9  PLT 134*    Chemistries   Recent Labs Lab 05/16/16 0428  NA 140  K 4.0  CL 107  CO2 29  GLUCOSE 94  BUN 15  CREATININE 1.10  CALCIUM 9.2  MG 2.1    Cardiac Enzymes  Recent Labs Lab 05/16/16 1006  TROPONINI <0.03    Microbiology Results  Results for orders placed or performed in visit on 12/22/15  GC/Chlamydia Probe Amp     Status: None   Collection Time: 12/22/15 12:10 PM  Result Value Ref Range Status   Chlamydia trachomatis, NAA Negative Negative Final   Neisseria gonorrhoeae by PCR Negative Negative Final    RADIOLOGY:  US Venous Img Lower Bilateral  Result Date: 05/16/2016 CLINICAL DATA:  Bilateral calf pain.  Evaluate for DVT. EXAM: BILATERAL LOWER EXTREMITY VENOUS DOPPLER ULTRASOUND TECHNIQUE: Gray-scale sonography with graded compression, as well as color Doppler and duplex ultrasound were performed to evaluate the lower extremity deep venous systems from the level of  the common femoral vein and including the common femoral, femoral, profunda femoral, popliteal and calf veins including the posterior tibial, peroneal and gastrocnemius veins when visible. The superficial great saphenous vein was also interrogated. Spectral Doppler was utilized to evaluate flow at rest and with distal augmentation maneuvers in the common femoral, femoral and popliteal veins. COMPARISON:  None. FINDINGS: RIGHT LOWER EXTREMITY Common Femoral Vein: No evidence of thrombus. Normal compressibility, respiratory phasicity and response to augmentation. Saphenofemoral Junction: No evidence of thrombus. Normal compressibility and flow on color Doppler imaging. Profunda Femoral Vein: No evidence of thrombus. Normal compressibility and flow on color Doppler imaging. Femoral Vein: No evidence of thrombus. Normal compressibility, respiratory phasicity  and response to augmentation. Popliteal Vein: No evidence of thrombus. Normal compressibility, respiratory phasicity and response to augmentation. Calf Veins: No evidence of thrombus. Normal compressibility and flow on color Doppler imaging. Superficial Great Saphenous Vein: No evidence of thrombus. Normal compressibility and flow on color Doppler imaging. Venous Reflux:  None. Other Findings:  None. LEFT LOWER EXTREMITY Common Femoral Vein: No evidence of thrombus. Normal compressibility, respiratory phasicity and response to augmentation. Saphenofemoral Junction: No evidence of thrombus. Normal compressibility and flow on color Doppler imaging. Profunda Femoral Vein: No evidence of thrombus. Normal compressibility and flow on color Doppler imaging. Femoral Vein: No evidence of thrombus. Normal compressibility, respiratory phasicity and response to augmentation. Popliteal Vein: No evidence of thrombus. Normal compressibility, respiratory phasicity and response to augmentation. Calf Veins: No evidence of thrombus. Normal compressibility and flow on color Doppler imaging. Superficial Great Saphenous Vein: No evidence of thrombus. Normal compressibility and flow on color Doppler imaging. Venous Reflux:  None. Other Findings:  None. IMPRESSION: No evidence DVT within either lower extremity. Electronically Signed   By: Sandi Mariscal M.D.   On: 05/16/2016 11:15    Management plans discussed with the patient, family and they are in agreement.  CODE STATUS:  Code Status History    Date Active Date Inactive Code Status Order ID Comments User Context   05/16/2016  4:16 AM 05/17/2016  3:08 PM Full Code [id number]  Saundra Shelling, MD Inpatient   05/05/2013  3:01 PM 05/05/2013  9:05 PM Full Code [id number]  Deboraha Sprang, MD Inpatient      TOTAL TIME TAKING CARE OF THIS PATIENT: 35 minutes.    Loletha Grayer M.D on 05/17/2016 at 5:54 PM  Between 7am to 6pm - Pager - [phone]  After 6pm go to [email or web address]  - password [email or web address]  [facility] Office  [phone]  CC: Primary care physician; Enid Derry, MD

## 2016-05-17 NOTE — Progress Notes (Addendum)
Patient Name: [name] Date of Encounter: 05/17/2016  Primary Cardiologist: [facility] Problem List     Principal Problem:   Syncope Active Problems:   Sustained ventricular tachycardia (HCC)   Cardiomyopathy, secondary ([place])   H/O aortic valve replacement   Ventricular tachycardia (HCC)     Subjective   Feels well this morning. No acute overnight events. Echo on 12/27 showed a slightly worsened EF at 35-40%. Seen by EP on 12/27. Added Ranexa and magnesium. Will hold on antiarrhythmic medications.   Inpatient Medications    Scheduled Meds: . aspirin EC  81 mg Oral Daily  . carvedilol  25 mg Oral BID WC  . enoxaparin (LOVENOX) injection  40 mg Subcutaneous Q24H  . FLUoxetine  20 mg Oral Daily  . fluticasone  2 spray Each Nare Daily  . magnesium oxide  400 mg Oral BID  . ramipril  10 mg Oral Daily  . ranolazine  500 mg Oral BID  . sodium chloride flush  3 mL Intravenous Q12H   Continuous Infusions:  PRN Meds: acetaminophen **OR** acetaminophen, ipratropium, ondansetron **OR** ondansetron (ZOFRAN) IV   Vital Signs    Vitals:   05/16/16 0735 05/16/16 1400 05/16/16 1958 05/17/16 0501  BP: 126/71 114/66 120/75 125/71  Pulse: 66 78 78 67  Resp: 18 17 18 18   Temp:  98.1 F (36.7 C) 98.2 F (36.8 C) 98.6 F (37 C)  TempSrc:  Oral Oral   SpO2: 96% 96% 95% 97%  Weight:      Height:        Intake/Output Summary (Last 24 hours) at 05/17/16 0835 Last data filed at 05/17/16 0503  Gross per 24 hour  Intake              720 ml  Output             1850 ml  Net            -1130 ml   Filed Weights   05/15/16 2313 05/16/16 0422  Weight: 225 lb (102.1 kg) 214 lb 6.4 oz (97.3 kg)    Physical Exam    GEN: Well nourished, well developed, in no acute distress.  HEENT: Grossly normal.  Neck: Supple, no JVD, carotid bruits, or masses. Cardiac: RRR, II/VI systolic murmur, no rubs, or gallops. No clubbing, cyanosis, edema.  Radials/DP/PT 2+ and equal  bilaterally.  Respiratory:  Respirations regular and unlabored, clear to auscultation bilaterally. GI: Soft, nontender, nondistended, BS + x 4. MS: no deformity or atrophy. Skin: warm and dry, no rash. Neuro:  Strength and sensation are intact. Psych: AAOx3.  Normal affect.  Labs    CBC  Recent Labs  05/15/16 2336  WBC 6.4  HGB 14.9  HCT 43.9  MCV 86.4  PLT [id number]*   Basic Metabolic Panel  Recent Labs  05/15/16 2336 05/16/16 0428  NA 140 140  K 3.8 4.0  CL 104 107  CO2 30 29  GLUCOSE 135* 94  BUN 16 15  CREATININE 1.20 1.10  CALCIUM 9.1 9.2  MG  --  2.1   Liver Function Tests No results for input(s): AST, ALT, ALKPHOS, BILITOT, PROT, ALBUMIN in the last 72 hours. No results for input(s): LIPASE, AMYLASE in the last 72 hours. Cardiac Enzymes  Recent Labs  05/15/16 2336 05/16/16 0428 05/16/16 1006  TROPONINI <0.03 <0.03 <0.03   BNP Invalid input(s): POCBNP D-Dimer No results for input(s): DDIMER in the last 72 hours. Hemoglobin A1C No  results for input(s): HGBA1C in the last 72 hours. Fasting Lipid Panel No results for input(s): CHOL, HDL, LDLCALC, TRIG, CHOLHDL, LDLDIRECT in the last 72 hours. Thyroid Function Tests  Recent Labs  05/16/16 1006  TSH 2.388    Telemetry    NSR, 70's bpm, 21 beats of narrow complex tachycardia at 18:01 and 18:10 on 12/27- Personally Reviewed  ECG    n/a - Personally Reviewed  Radiology    US Venous Img Lower Bilateral  Result Date: 05/16/2016 CLINICAL DATA:  Bilateral calf pain.  Evaluate for DVT. EXAM: BILATERAL LOWER EXTREMITY VENOUS DOPPLER ULTRASOUND TECHNIQUE: Gray-scale sonography with graded compression, as well as color Doppler and duplex ultrasound were performed to evaluate the lower extremity deep venous systems from the level of the common femoral vein and including the common femoral, femoral, profunda femoral, popliteal and calf veins including the posterior tibial, peroneal and gastrocnemius veins  when visible. The superficial great saphenous vein was also interrogated. Spectral Doppler was utilized to evaluate flow at rest and with distal augmentation maneuvers in the common femoral, femoral and popliteal veins. COMPARISON:  None. FINDINGS: RIGHT LOWER EXTREMITY Common Femoral Vein: No evidence of thrombus. Normal compressibility, respiratory phasicity and response to augmentation. Saphenofemoral Junction: No evidence of thrombus. Normal compressibility and flow on color Doppler imaging. Profunda Femoral Vein: No evidence of thrombus. Normal compressibility and flow on color Doppler imaging. Femoral Vein: No evidence of thrombus. Normal compressibility, respiratory phasicity and response to augmentation. Popliteal Vein: No evidence of thrombus. Normal compressibility, respiratory phasicity and response to augmentation. Calf Veins: No evidence of thrombus. Normal compressibility and flow on color Doppler imaging. Superficial Great Saphenous Vein: No evidence of thrombus. Normal compressibility and flow on color Doppler imaging. Venous Reflux:  None. Other Findings:  None. LEFT LOWER EXTREMITY Common Femoral Vein: No evidence of thrombus. Normal compressibility, respiratory phasicity and response to augmentation. Saphenofemoral Junction: No evidence of thrombus. Normal compressibility and flow on color Doppler imaging. Profunda Femoral Vein: No evidence of thrombus. Normal compressibility and flow on color Doppler imaging. Femoral Vein: No evidence of thrombus. Normal compressibility, respiratory phasicity and response to augmentation. Popliteal Vein: No evidence of thrombus. Normal compressibility, respiratory phasicity and response to augmentation. Calf Veins: No evidence of thrombus. Normal compressibility and flow on color Doppler imaging. Superficial Great Saphenous Vein: No evidence of thrombus. Normal compressibility and flow on color Doppler imaging. Venous Reflux:  None. Other Findings:  None.  IMPRESSION: No evidence DVT within either lower extremity. Electronically Signed   By: Sandi Mariscal M.D.   On: 05/16/2016 11:15    Cardiac Studies   Echo 05/16/2016: Study Conclusions  - Left ventricle: The cavity size was mildly dilated. There was   mild concentric hypertrophy. Systolic function was moderately   reduced. The estimated ejection fraction was in the range of 35%   to 40%. Basal septal and lateral wall hypokineiss, Basal to mid   inferior/posterior wall akinesis, appears aneurysmal (seen   previously). Left ventricular diastolic function parameters were   normal. - Aortic valve: A bioprosthesis was present. There was mild to   moderate stenosis. There was mild regurgitation. Peak velocity   (S): 323 cm/s. Mean gradient (S): 24 mm Hg. - Left atrium: The atrium was mildly dilated. - Right ventricle: Systolic function was normal. - Pulmonary arteries: Systolic pressure was within the normal   range.  Impressions:  - EF is sligtly worse when compared to study dated 06/2015   (previously 45 to 50%)  Patient  Profile     40 y.o. male with history of ventricular tachycardia in the setting of surgically repaired congential heart disease with VSD repair and bioprosthetic aortic valve replacement s/p ICD implantation with most recent generator replacement in 04/2013 with appropriate shock for VT in 08/2011, cardiomyopathy 2/2 congential heart disease, obesity, HTN, and IBD who presented to [facility] on 05/15/2016 with an episode of syncope on 12/26 while doing the laundry. Device interrogation showed VT associated syncope. Telemetry has shown both WCT and narrow complex tachycardia.   Assessment & Plan    1. VT associated syncope s/p ICD: -Seen by EP -Added magnesium and Ranexa -SED, samples of Ranexa ready for pick up at the office -Continue Coreg at current dose -Follow up with EP -Needs regular remote or in office device interrogation -If increase in symptoms or increased  ventricular ectopy is noted needs to follow up with EP -ICD functioning normally  2. WCT and narrow complex tachycardia: -Coreg as above -As above  3. Cardiomyopathy 2/2 congential heart disease s/p repair: -Does not appear to be a primary ischemic event given negative troponin -Continue Coreg  Signed, Christell Faith, PA-C [facility] Pager: [phone] 05/17/2016, 8:35 AM   Attending Note Patient seen and examined, agree with detailed note above,  Patient presentation and plan discussed on rounds.   No complaints overnight Reports that he feels well, denies any near-syncope or syncope, no palpitations Telemetry reviewed showing very short run of narrow complex tachycardia consistent with SVT, 21 beats occurring around 6 PM last night. No further sustained VT  On exam, no JVD, lungs clear to auscultation, heart sounds regular, abdomen soft nontender, no leg edema  No lab work for review  ----syncope In the setting of sustained VT Seen by Dr. Caryl Comes, started on magnesium, Ranexa We have contacted our office, samples available, we will leave this at the front desk for him Recommended he start ranexa 500 mg twice a day If he has recurrent symptoms of VT, associated with syncope, may benefit from antiarrhythmics such as mexiletine, amiodarone. Will defer this decision to Dr. 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 [facility]

## 2016-05-17 NOTE — Telephone Encounter (Signed)
[name] from [facility] scheduled hospital follow up appointment for ventricular tachycardia 05/29/16

## 2016-05-17 NOTE — Progress Notes (Signed)
A&O. Independent. Slept well through the night. No s/s distress.

## 2016-05-17 NOTE — Care Management (Signed)
Informed there are no discharge needs identified by members of the care team.

## 2016-05-17 NOTE — Discharge Instructions (Signed)
Syncope °Syncope is when you temporarily lose consciousness. Syncope may also be called fainting or passing out. It is caused by a sudden decrease in blood flow to the brain. Even though most causes of syncope are not dangerous, syncope can be a sign of a serious medical problem. Signs that you may be about to faint include: °· Feeling dizzy or light-headed. °· Feeling nauseous. °· Seeing all white or all black in your field of vision. °· Having cold, clammy skin. °If you fainted, get medical help right away.Call your local emergency services (911 in the [place] Do not drive yourself to the hospital. °Follow these instructions at home: °Pay attention to any changes in your symptoms. Take these actions to help with your condition: °· Have someone stay with you until you feel stable. °· Do not drive, use machinery, or play sports until your health care provider says it is okay. °· Keep all follow-up visits as told by your health care provider. This is important. °· If you start to feel like you might faint, lie down right away and raise (elevate) your feet above the level of your heart. Breathe deeply and steadily. Wait until all of the symptoms have passed. °· Drink enough fluid to keep your urine clear or pale yellow. °· If you are taking blood pressure or heart medicine, get up slowly and take several minutes to sit and then stand. This can reduce dizziness. °· Take over-the-counter and prescription medicines only as told by your health care provider. °Get help right away if: °· You have a severe headache. °· You have unusual pain in your chest, abdomen, or back. °· You are bleeding from your mouth or rectum, or you have black or tarry stool. °· You have a very fast or irregular heartbeat (palpitations). °· You have pain with breathing. °· You faint once or repeatedly. °· You have a seizure. °· You are confused. °· You have trouble walking. °· You have severe weakness. °· You have vision problems. °These symptoms  may represent a serious problem that is an emergency. Do not wait to see if your symptoms will go away. Get medical help right away. Call your local emergency services (911 in the [place] Do not drive yourself to the hospital. °This information is not intended to replace advice given to you by your health care provider. Make sure you discuss any questions you have with your health care provider. °Document Released: 05/07/2005 Document Revised: 10/13/2015 Document Reviewed: 01/19/2015 °Elsevier Interactive Patient Education © 2017 [employer] ° °

## 2016-05-22 NOTE — ED Provider Notes (Signed)
[facility] Emergency Department Provider Note   First MD Initiated Contact with Patient 05/15/16 2321     (approximate)  I have reviewed the triage vital signs and the nursing notes.   HISTORY  Chief Complaint Loss of Consciousness    HPI [name] is a 41 y.o. male with bolus of chronic medical conditions including cardiomyopathy, ventricular tachycardia with AICD in place presents to the emergency department with history of syncopal episode which occurred tonight. Patient states that he was bending over to remove something from his washing machine. Patient's wife states that the patient yelled out for help and on her arrival he had completely passed out. Patient denies any preceding symptoms no chest pain shortness of breath dizziness nausea or vomiting, or headache. Patient denies any symptoms at present.   Past Medical History:  Diagnosis Date  . Anxiety   . Aortic valve replaced    Requiring replacement, specifics not available  . Hearing loss of both ears 12/22/2015  . Heart murmur   . Hypertension   . IBD (inflammatory bowel disease) 09/23/2015  . ICD (implantable cardiac defibrillator), BSX single   . Obesity 12/22/2015  . Secondary cardiomyopathy ([place])   . Syncope   . Ventricular septal defect   . Ventricular tachycardia ([place])    appropriate VT shock therapy /13    Patient Active Problem List   Diagnosis Date Noted  . Syncope 05/16/2016  . Sustained ventricular tachycardia ([place]) 05/16/2016  . Ventricular tachycardia ([place]) 05/16/2016  . Prostate cancer screening 12/22/2015  . Encounter for screening for HIV 12/22/2015  . Need for hepatitis B screening test 12/22/2015  . Need for hepatitis C screening test 12/22/2015  . Hearing loss of both ears 12/22/2015  . Obesity 12/22/2015  . Abnormal findings-gastrointestinal tract   . IBD (inflammatory bowel disease) 09/23/2015  . Pain in lower back 09/15/2015  . Nausea 09/15/2015  . Diarrhea  09/15/2015  . Night sweats 09/15/2015  . Groin pain 07/29/2015  . Screening for STD (sexually transmitted disease) 07/29/2015  . Preventative health care 05/10/2015  . Presence of cardiac defibrillator 05/10/2015  . Influenza vaccination declined by patient 05/10/2015  . Need for diphtheria-tetanus-pertussis (Tdap) vaccine, adult/adolescent 05/10/2015  . Hypertension goal BP (blood pressure) < 140/90 12/13/2011  . H/O aortic valve replacement 08/08/2009  . Cardiomyopathy, secondary ([place]) 03/09/2009  . History of ventricular tachycardia 03/09/2009    Past Surgical History:  Procedure Laterality Date  . ANGIOPLASTY     RCA repair with vein angioplasty following injury with the aforementioned surgery  . AORTIC VALVE REPLACEMENT     Bioprosthesis  . CARDIAC DEFIBRILLATOR PLACEMENT    . CARDIAC VALVE REPLACEMENT    . CHOLECYSTECTOMY    . COLONOSCOPY WITH PROPOFOL N/A 12/06/2015   Procedure: COLONOSCOPY WITH PROPOFOL;  Surgeon: [name], MD;  Location: [facility] ENDOSCOPY;  Service: Endoscopy;  Laterality: N/A;  . CORONARY ARTERY BYPASS GRAFT    . IMPLANTABLE CARDIOVERTER DEFIBRILLATOR GENERATOR CHANGE N/A 05/05/2013   Procedure: IMPLANTABLE CARDIOVERTER DEFIBRILLATOR GENERATOR CHANGE;  Surgeon: Deboraha Sprang, MD;  Location: [facility] CATH LAB;  Service: Cardiovascular;  Laterality: N/A;  . INSERT / REPLACE / REMOVE PACEMAKER    . VSD REPAIR      Prior to Admission medications   Medication Sig Start Date End Date Taking? Authorizing Provider  ATROVENT HFA 17 MCG/ACT inhaler Inhale 2 puffs into the lungs 4 (four) times daily as needed. 04/09/16  Yes Historical Provider, MD  carvedilol (COREG)  25 MG tablet Take 1 tablet (25 mg total) by mouth 2 (two) times daily with a meal. 09/03/15  Yes Bhavinkumar Bhagat, PA  FLUoxetine (PROZAC) 20 MG capsule Take 1 capsule (20 mg total) by mouth daily. 03/25/16  Yes Arnetha Courser, MD  fluticasone (FLONASE) 50 MCG/ACT nasal spray Place 2 sprays into both  nostrils daily. 08/17/15  Yes Steele Sizer, MD  ramipril (ALTACE) 10 MG capsule Take 10 mg by mouth daily. 06/20/15  Yes Historical Provider, MD  loratadine (CLARITIN) 5 MG chewable tablet Chew 5 mg by mouth daily as needed for allergies.    Historical Provider, MD  magnesium oxide (MAG-OX) 400 (241.3 Mg) MG tablet Take 1 tablet (400 mg total) by mouth 2 (two) times daily. 05/17/16   Loletha Grayer, MD  ranolazine (RANEXA) 500 MG 12 hr tablet Take 1 tablet (500 mg total) by mouth 2 (two) times daily. 05/17/16   Loletha Grayer, MD    Allergies Shellfish allergy  Family History  Problem Relation Age of Onset  . Heart disease Father     Social History Social History  Substance Use Topics  . Smoking status: Never Smoker  . Smokeless tobacco: Never Used  . Alcohol use No    Review of Systems Constitutional: No fever/chills Eyes: No visual changes. ENT: No sore throat. Cardiovascular: Denies chest pain. Respiratory: Denies shortness of breath. Gastrointestinal: No abdominal pain.  No nausea, no vomiting.  No diarrhea.  No constipation. Genitourinary: Negative for dysuria. Musculoskeletal: Negative for back pain. Skin: Negative for rash. Neurological: Negative for headaches, focal weakness or numbness.Positive for syncope  10-point ROS otherwise negative.  ____________________________________________   PHYSICAL EXAM:  VITAL SIGNS: ED Triage Vitals  Enc Vitals Group     BP 05/15/16 2313 (!) 145/76     Pulse Rate 05/15/16 2313 75     Resp 05/15/16 2313 18     Temp 05/15/16 2313 97.8 F (36.6 C)     Temp Source 05/15/16 2313 Oral     SpO2 05/15/16 2313 99 %     Weight 05/15/16 2313 225 lb (102.1 kg)     Height 05/15/16 2313 5\' 10"  (1.778 m)     Head Circumference --      Peak Flow --      Pain Score 05/15/16 2314 0     Pain Loc --      Pain Edu? --      Excl. in [place]? --     Constitutional: Alert and oriented. Well appearing and in no acute distress. Eyes:  Conjunctivae are normal. PERRL. EOMI. Head: Atraumatic. Ears:  Healthy appearing ear canals and TMs bilaterally Nose: No congestion/rhinnorhea. Mouth/Throat: Mucous membranes are moist.  Oropharynx non-erythematous. Neck: No stridor.  No cervical spine tenderness to palpation. Cardiovascular: Normal rate, regular rhythm. Good peripheral circulation. Grossly normal heart sounds. Respiratory: Normal respiratory effort.  No retractions. Lungs CTAB. Gastrointestinal: Soft and nontender. No distention.  Musculoskeletal: No lower extremity tenderness nor edema. No gross deformities of extremities. Neurologic:  Normal speech and language. No gross focal neurologic deficits are appreciated.  Skin:  Skin is warm, dry and intact. No rash noted. Psychiatric: Mood and affect are normal. Speech and behavior are normal.  ____________________________________________   LABS (all labs ordered are listed, but only abnormal results are displayed)  Labs Reviewed  BASIC METABOLIC PANEL - Abnormal; Notable for the following:       Result Value   Glucose, Bld 135 (*)    All other components  within normal limits  CBC - Abnormal; Notable for the following:    Platelets 134 (*)    All other components within normal limits  URINALYSIS, COMPLETE (UACMP) WITH MICROSCOPIC - Abnormal; Notable for the following:    Color, Urine YELLOW (*)    APPearance CLEAR (*)    All other components within normal limits  BASIC METABOLIC PANEL - Abnormal; Notable for the following:    Anion gap 4 (*)    All other components within normal limits  TROPONIN I  TROPONIN I  TROPONIN I  MAGNESIUM  TSH   ____________________________________________  EKG ED ECG REPORT I, Farley N BROWN, the attending physician, personally viewed and interpreted this ECG.   Date: 05/22/2016  EKG Time: 11:09PM  Rate: 77  Rhythm: Normal sinus rhythm with premature ventricular contraction  Axis: Normal  Intervals: Irregular PR interval   ST&T Change: None  ________________  Procedures    INITIAL IMPRESSION / ASSESSMENT AND PLAN / ED COURSE  Pertinent labs & imaging results that were available during my care of the patient were reviewed by me and considered in my medical decision making (see chart for details).  41 year old male presents with syncopal episode. Concern for possible AICD firing however unable to interrogate the patient's pacemaker in the emergency department as he has a brain that we do not have the capability of interrogating. We'll admit the patient to the hospitalist service for further evaluation and management. Patient stable during ED stay   Clinical Course     ____________________________________________  FINAL CLINICAL IMPRESSION(S) / ED DIAGNOSES  Syncope  MEDICATIONS GIVEN DURING THIS VISIT:  Medications  LORazepam (ATIVAN) injection 1 mg (1 mg Intravenous Given 05/15/16 2352)     NEW OUTPATIENT MEDICATIONS STARTED DURING THIS VISIT:  Discharge Medication List as of 05/17/2016 11:26 AM    START taking these medications   Details  magnesium oxide (MAG-OX) 400 (241.3 Mg) MG tablet Take 1 tablet (400 mg total) by mouth 2 (two) times daily., Starting Thu 05/17/2016, Print    ranolazine (RANEXA) 500 MG 12 hr tablet Take 1 tablet (500 mg total) by mouth 2 (two) times daily., Starting Thu 05/17/2016, Print        Discharge Medication List as of 05/17/2016 11:26 AM      Discharge Medication List as of 05/17/2016 11:26 AM       Note:  This document was prepared using Dragon voice recognition software and may include unintentional dictation errors.    Gregor Hams, MD 05/22/16 [phone]

## 2016-05-23 NOTE — Progress Notes (Signed)
Electrophysiology Office Note Date: 05/25/2016  ID:  [name], DOB December 10, 1975, MRN [id number]  PCP: Enid Derry, MD Electrophysiologist: Caryl Comes  CC: Routine ICD follow-up  [name] is a 41 y.o. male seen today for Dr Caryl Comes.  He presents today for post hospital electrophysiology followup.  He was recently seen at [facility] for appropriate ICD therapy.  Mag-Ox and Ranexa were added at that time.  Since discharge, the patient reports doing very well. He denies chest pain, palpitations, dyspnea, PND, orthopnea, nausea, vomiting, dizziness, syncope, edema, weight gain, or early satiety.  He has not had further ICD shocks.   Device History: BSX single chamber ICD implanted 2008 for NICM; gen change 2014 History of appropriate therapy: Yes History of AAD therapy: No   Past Medical History:  Diagnosis Date  . Anxiety   . Aortic valve replaced    Requiring replacement, specifics not available  . Hearing loss of both ears 12/22/2015  . Heart murmur   . Hypertension   . IBD (inflammatory bowel disease) 09/23/2015  . ICD (implantable cardiac defibrillator), BSX single   . Obesity 12/22/2015  . Secondary cardiomyopathy ([place])   . Syncope   . Ventricular septal defect   . Ventricular tachycardia ([place])    appropriate VT shock therapy /13   Past Surgical History:  Procedure Laterality Date  . ANGIOPLASTY     RCA repair with vein angioplasty following injury with the aforementioned surgery  . AORTIC VALVE REPLACEMENT     Bioprosthesis  . CARDIAC DEFIBRILLATOR PLACEMENT    . CARDIAC VALVE REPLACEMENT    . CHOLECYSTECTOMY    . COLONOSCOPY WITH PROPOFOL N/A 12/06/2015   Procedure: COLONOSCOPY WITH PROPOFOL;  Surgeon: [name], MD;  Location: [facility] ENDOSCOPY;  Service: Endoscopy;  Laterality: N/A;  . CORONARY ARTERY BYPASS GRAFT    . IMPLANTABLE CARDIOVERTER DEFIBRILLATOR GENERATOR CHANGE N/A 05/05/2013   Procedure: IMPLANTABLE CARDIOVERTER DEFIBRILLATOR GENERATOR CHANGE;  Surgeon:  Deboraha Sprang, MD;  Location: [facility] CATH LAB;  Service: Cardiovascular;  Laterality: N/A;  . INSERT / REPLACE / REMOVE PACEMAKER    . VSD REPAIR      Current Outpatient Prescriptions  Medication Sig Dispense Refill  . ATROVENT HFA 17 MCG/ACT inhaler Inhale 2 puffs into the lungs 4 (four) times daily as needed.    . carvedilol (COREG) 25 MG tablet Take 1 tablet (25 mg total) by mouth 2 (two) times daily with a meal. 180 tablet 3  . FLUoxetine (PROZAC) 20 MG capsule Take 1 capsule (20 mg total) by mouth daily. 30 capsule 9  . fluticasone (FLONASE) 50 MCG/ACT nasal spray Place 2 sprays into both nostrils daily. 16 g 2  . loratadine (CLARITIN) 5 MG chewable tablet Chew 5 mg by mouth daily as needed for allergies.    . magnesium oxide (MAG-OX) 400 (241.3 Mg) MG tablet Take 1 tablet (400 mg total) by mouth 2 (two) times daily. 60 tablet 0  . ramipril (ALTACE) 10 MG capsule Take 10 mg by mouth daily.    . ranolazine (RANEXA) 500 MG 12 hr tablet Take 1 tablet (500 mg total) by mouth 2 (two) times daily. 60 tablet 0   No current facility-administered medications for this visit.     Allergies:   Shellfish allergy   Social History: Social History   Social History  . Marital status: Married    Spouse name: N/A  . Number of children: N/A  . Years of education: N/A   Occupational History  .  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 Smoking status: Never Smoker  . Smokeless tobacco: Never Used  . Alcohol use No  . Drug use: No  . Sexual activity: Not on file   Other Topics Concern  . Not on file   Social History Narrative   Married    Family History: Family History  Problem Relation Age of Onset  . Heart disease Father     Review of Systems: All other systems reviewed and are otherwise negative except as noted above.   Physical Exam: VS:  BP 116/78   Pulse 68   Ht 5' 9.5" (1.765 m)   Wt 220 lb 9.6 oz (100.1 kg)   BMI 32.11 kg/m  , BMI Body  mass index is 32.11 kg/m.  GEN- The patient is well appearing, alert and oriented x 3 today.   HEENT: normocephalic, atraumatic; sclera clear, conjunctiva pink; hearing intact; oropharynx clear; neck supple Lungs- Clear to ausculation bilaterally, normal work of breathing.  No wheezes, rales, rhonchi Heart- Regular rate and rhythm GI- soft, non-tender, non-distended, bowel sounds present Extremities- no clubbing, cyanosis, or edema; DP/PT/radial pulses 2+ bilaterally MS- no significant deformity or atrophy Skin- warm and dry, no rash or lesion; ICD pocket well healed Psych- euthymic mood, full affect Neuro- strength and sensation are intact  ICD interrogation- reviewed in detail today,  See PACEART report  EKG:  EKG is not ordered today.  Recent Labs: 12/22/2015: ALT 47 05/15/2016: Hemoglobin 14.9 05/16/2016: Magnesium 2.1; TSH 2.388 05/24/2016: BUN 12; Creatinine, Ser 1.28; Platelets 180; Potassium 5.1; Sodium 143   Wt Readings from Last 3 Encounters:  05/24/16 220 lb 9.6 oz (100.1 kg)  05/16/16 214 lb 6.4 oz (97.3 kg)  12/22/15 218 lb (98.9 kg)     Other studies Reviewed: Additional studies/ records that were reviewed today include: Dr Olin Pia office notes  Assessment and Plan:  1.  Chronic systolic dysfunction euvolemic today Stable on an appropriate medical regimen Normal ICD function See Pace Art report No changes today  2.  Ventricular tachycardia No recurrence since ER visit Continue Mag-Ox and Ranexa per Dr Caryl Comes BMET, Mg today No driving x6 months per [place] DMV - pt informed today  3.  HTN Stable No change required today  4.  Corrected congenital heart disease With significant history, would likely benefit from follow up with general cardiology Will defer to Dr Caryl Comes   Current medicines are reviewed at length with the patient today.   The patient does not have concerns regarding his medicines.  The following changes were made today:  none  Labs/ tests  ordered today include: none Orders Placed This Encounter  Procedures  . Basic metabolic panel  . CBC w/Diff     Disposition:   Follow up with [facility], Dr Caryl Comes 3 months in [facility], [place], [place] 05/25/2016 8:10 AM  [facility] [address] [place] [address] [phone] (office) [phone] (fax)

## 2016-05-24 ENCOUNTER — Ambulatory Visit (INDEPENDENT_AMBULATORY_CARE_PROVIDER_SITE_OTHER): Payer: 59 | Admitting: Nurse Practitioner

## 2016-05-24 ENCOUNTER — Telehealth: Payer: Self-pay | Admitting: Internal Medicine

## 2016-05-24 ENCOUNTER — Encounter: Payer: Self-pay | Admitting: Nurse Practitioner

## 2016-05-24 VITALS — BP 116/78 | HR 68 | Ht 69.5 in | Wt 220.6 lb

## 2016-05-24 DIAGNOSIS — I4729 Other ventricular tachycardia: Secondary | ICD-10-CM

## 2016-05-24 DIAGNOSIS — I10 Essential (primary) hypertension: Secondary | ICD-10-CM

## 2016-05-24 DIAGNOSIS — I5022 Chronic systolic (congestive) heart failure: Secondary | ICD-10-CM

## 2016-05-24 DIAGNOSIS — I472 Ventricular tachycardia, unspecified: Secondary | ICD-10-CM

## 2016-05-24 DIAGNOSIS — Q249 Congenital malformation of heart, unspecified: Secondary | ICD-10-CM

## 2016-05-24 DIAGNOSIS — Z95810 Presence of automatic (implantable) cardiac defibrillator: Secondary | ICD-10-CM | POA: Diagnosis not present

## 2016-05-24 NOTE — Patient Instructions (Signed)
Medication Instructions:  Your physician recommends that you continue on your current medications as directed. Please refer to the Current Medication list given to you today.   Labwork: Your physician recommends that you return for lab work today: BMP/MAG   Testing/Procedures: None ordered   Follow-Up:  Your physician recommends that you schedule a follow-up appointment in: 3 months with Dr Caryl Comes in [facility]   Any Other Special Instructions Will Be Listed Below (If Applicable).     If you need a refill on your cardiac medications before your next appointment, please call your pharmacy.

## 2016-05-25 LAB — BASIC METABOLIC PANEL
BUN/Creatinine Ratio: 9 (ref 9–20)
BUN: 12 mg/dL (ref 6–24)
CALCIUM: 10 mg/dL (ref 8.7–10.2)
CHLORIDE: 102 mmol/L (ref 96–106)
CO2: 20 mmol/L (ref 18–29)
Creatinine, Ser: 1.28 mg/dL — ABNORMAL HIGH (ref 0.76–1.27)
GFR calc non Af Amer: 69 mL/min/{1.73_m2} (ref >59)
GFR, EST AFRICAN AMERICAN: 79 mL/min/{1.73_m2} (ref >59)
GLUCOSE: 82 mg/dL (ref 65–99)
POTASSIUM: 5.1 mmol/L (ref 3.5–5.2)
Sodium: 143 mmol/L (ref 134–144)

## 2016-05-25 LAB — CBC WITH DIFFERENTIAL/PLATELET
Basophils Absolute: 0 10*3/uL (ref 0.0–0.2)
Basos: 1 %
EOS (ABSOLUTE): 0.2 10*3/uL (ref 0.0–0.4)
Eos: 5 %
Hematocrit: 47.6 % (ref 37.5–51.0)
Hemoglobin: 16.3 g/dL (ref 13.0–17.7)
IMMATURE GRANS (ABS): 0 10*3/uL (ref 0.0–0.1)
Immature Granulocytes: 0 %
LYMPHS: 27 %
Lymphocytes Absolute: 1.2 10*3/uL (ref 0.7–3.1)
MCH: 28.9 pg (ref 26.6–33.0)
MCHC: 34.2 g/dL (ref 31.5–35.7)
MCV: 84 fL (ref 79–97)
MONOCYTES: 16 %
Monocytes Absolute: 0.7 10*3/uL (ref 0.1–0.9)
NEUTROS ABS: 2.3 10*3/uL (ref 1.4–7.0)
Neutrophils: 51 %
PLATELETS: 180 10*3/uL (ref 150–379)
RBC: 5.64 x10E6/uL (ref 4.14–5.80)
RDW: 13 % (ref 12.3–15.4)
WBC: 4.5 10*3/uL (ref 3.4–10.8)

## 2016-05-25 LAB — CUP PACEART INCLINIC DEVICE CHECK
Date Time Interrogation Session: 20180105083950
Implantable Lead Implant Date: 20080214
Implantable Lead Location: 753860
MDC IDC LEAD MODEL: 185
MDC IDC LEAD SERIAL: 178017
MDC IDC PG IMPLANT DT: 20141216
MDC IDC PG SERIAL: 126781

## 2016-05-28 ENCOUNTER — Other Ambulatory Visit: Payer: Self-pay | Admitting: Family Medicine

## 2016-05-28 DIAGNOSIS — I472 Ventricular tachycardia, unspecified: Secondary | ICD-10-CM

## 2016-05-28 NOTE — Progress Notes (Signed)
Note from cardiologist's office; requested Mg2+ tomorrow; I'll be glad to order; order entered

## 2016-05-28 NOTE — Assessment & Plan Note (Signed)
Check Mg2+ level, send to NP

## 2016-05-29 ENCOUNTER — Ambulatory Visit (INDEPENDENT_AMBULATORY_CARE_PROVIDER_SITE_OTHER): Payer: 59 | Admitting: Family Medicine

## 2016-05-29 ENCOUNTER — Encounter: Payer: Self-pay | Admitting: Family Medicine

## 2016-05-29 DIAGNOSIS — R6883 Chills (without fever): Secondary | ICD-10-CM | POA: Diagnosis not present

## 2016-05-29 DIAGNOSIS — I472 Ventricular tachycardia, unspecified: Secondary | ICD-10-CM

## 2016-05-29 MED ORDER — OSELTAMIVIR PHOSPHATE 75 MG PO CAPS: 75.0000 mg | ORAL_CAPSULE | Freq: Two times a day (BID) | ORAL | 0 refills | Status: AC

## 2016-05-29 NOTE — Progress Notes (Signed)
BP 118/82 (BP Location: Left Arm, Patient Position: Sitting, Cuff Size: Normal)   Pulse 91   Temp 98.2 F (36.8 C) (Oral)   Resp 14   Wt 214 lb 2 oz (97.1 kg)   SpO2 97%   BMI 31.17 kg/m    Subjective:    Patient ID: [name], male    DOB: 09/15/1975, 41 y.o.   MRN: [id number]  HPI: [name] is a 41 y.o. male  Chief Complaint  Patient presents with  . Tachycardia    Ventricular Tachycardia;   . Labs Only    Magnesium check    Recent hospitalization; notes reviewed; admitted May 15, 2016, discharged May 17, 2016 Admitted after having syncopal episode; he has cardiomyopathy, sustained V-tach, h/o aortic valve replacement; he was seen by cardiologist and EP specialist; was started on magnesium and Ranexa, Coreg was continued as was his ramipril  I received a note from cardiology staff; they wanted [place] to draw a magnesium level here today  Sick for a few days; chills, but no fevers; has been in the hospital, but no travel; having sore throat, irritation; not sinus issues; no issues with ears except for ringing; no flu shot per choice; no vitamin C; a little wheeze last night; patient is concerned that he has the flu; as we talked, he would feel more comfortable starting Tamiflu given his recent hospitalization and rampant flu in the area  He got sick in November; weak, asthmatic, gave him an inhaler; gave him something for cough and congestion  Depression screen [facility] 2/9 05/29/2016 12/22/2015 09/15/2015 07/29/2015 05/10/2015  Decreased Interest 0 0 0 0 0  Down, Depressed, Hopeless 0 0 0 0 0  PHQ - 2 Score 0 0 0 0 0   Relevant past medical, surgical, family and social history reviewed Past Medical History:  Diagnosis Date  . Anxiety   . Aortic valve replaced    Requiring replacement, specifics not available  . Hearing loss of both ears 12/22/2015  . Heart murmur   . Hypertension   . IBD (inflammatory bowel disease) 09/23/2015  . ICD (implantable cardiac  defibrillator), BSX single   . Obesity 12/22/2015  . Secondary cardiomyopathy ([place])   . Syncope   . Ventricular septal defect   . Ventricular tachycardia ([place])    appropriate VT shock therapy /13   Past Surgical History:  Procedure Laterality Date  . ANGIOPLASTY     RCA repair with vein angioplasty following injury with the aforementioned surgery  . AORTIC VALVE REPLACEMENT     Bioprosthesis  . CARDIAC DEFIBRILLATOR PLACEMENT    . CARDIAC VALVE REPLACEMENT    . CHOLECYSTECTOMY    . COLONOSCOPY WITH PROPOFOL N/A 12/06/2015   Procedure: COLONOSCOPY WITH PROPOFOL;  Surgeon: [name], MD;  Location: [facility] ENDOSCOPY;  Service: Endoscopy;  Laterality: N/A;  . CORONARY ARTERY BYPASS GRAFT    . IMPLANTABLE CARDIOVERTER DEFIBRILLATOR GENERATOR CHANGE N/A 05/05/2013   Procedure: IMPLANTABLE CARDIOVERTER DEFIBRILLATOR GENERATOR CHANGE;  Surgeon: Deboraha Sprang, MD;  [facility] CATH LAB;  Service: Cardiovascular;  Laterality: N/A;  . INSERT / REPLACE / REMOVE PACEMAKER    . VSD REPAIR     Family History  Problem Relation Age of Onset  . Heart disease Father    Social History  Substance Use Topics  . Smoking status: Never Smoker  . Smokeless tobacco: Never Used  . Alcohol use No    Interim medical history since last visit reviewed. Allergies and medications  reviewed  Review of Systems Per HPI unless specifically indicated above     Objective:    BP 118/82 (BP Location: Left Arm, Patient Position: Sitting, Cuff Size: Normal)   Pulse 91   Temp 98.2 F (36.8 C) (Oral)   Resp 14   Wt 214 lb 2 oz (97.1 kg)   SpO2 97%   BMI 31.17 kg/m   Wt Readings from Last 3 Encounters:  05/29/16 214 lb 2 oz (97.1 kg)  05/24/16 220 lb 9.6 oz (100.1 kg)  05/16/16 214 lb 6.4 oz (97.3 kg)    Physical Exam  Constitutional: He appears well-developed and well-nourished. No distress.  Eyes: No scleral icterus.  Cardiovascular: Normal rate and regular rhythm.   No extrasystoles are present.    Pulmonary/Chest: Effort normal.  Neurological: He is alert.  Skin: No pallor.  Psychiatric: He has a normal mood and affect.      Assessment & Plan:   Problem List Items Addressed This Visit      Cardiovascular and Mediastinum   Ventricular tachycardia ([place])   Relevant Orders   Magnesium (Completed)     Other   Chills (without fever)    Check influenza today; patient concerned he has the flu; it is rampant in our area, and he was recently hospitalized; I will err on the side of caution and treat with Tamiflu; rest, hydration; AVOID decongestants      Relevant Orders   POCT Influenza A/B (Completed)      Follow up plan: No Follow-up on file.  An after-visit summary was printed and given to the patient at [place].  Please see the patient instructions which may contain other information and recommendations beyond what is mentioned above in the assessment and plan.  Meds ordered this encounter  Medications  . oseltamivir (TAMIFLU) 75 MG capsule    Sig: Take 1 capsule (75 mg total) by mouth 2 (two) times daily.    Dispense:  10 capsule    Refill:  0    Orders Placed This Encounter  Procedures  . Magnesium  . POCT Influenza A/B

## 2016-05-29 NOTE — Patient Instructions (Addendum)
  Try to use PLAIN allergy medicine without the decongestant Avoid: phenylephrine, phenylpropanolamine, and pseudoephredine Start the tamiflu Try vitamin C (orange juice if not diabetic or vitamin C tablets) and drink green tea to help your immune system during your illness Get plenty of rest and hydration

## 2016-05-29 NOTE — Assessment & Plan Note (Addendum)
Check influenza today; patient concerned he has the flu; it is rampant in our area, and he was recently hospitalized; I will err on the side of caution and treat with Tamiflu; rest, hydration; AVOID decongestants

## 2016-05-30 LAB — MAGNESIUM: MAGNESIUM: 1.9 mg/dL (ref 1.6–2.3)

## 2016-05-31 LAB — POCT INFLUENZA A/B
INFLUENZA A, POC: NEGATIVE
INFLUENZA B, POC: NEGATIVE

## 2016-06-23 ENCOUNTER — Other Ambulatory Visit: Payer: Self-pay | Admitting: Internal Medicine

## 2016-06-29 ENCOUNTER — Other Ambulatory Visit: Payer: Self-pay | Admitting: Cardiology

## 2016-06-29 MED ORDER — RANOLAZINE ER 500 MG PO TB12: 500.0000 mg | ORAL_TABLET | Freq: Two times a day (BID) | ORAL | 0 refills | Status: DC

## 2016-07-18 ENCOUNTER — Telehealth: Payer: Self-pay | Admitting: Internal Medicine

## 2016-07-18 NOTE — Telephone Encounter (Signed)
Patient calling in wanting to know if he should continue the ranexa and if so he needs refills on this. Unable to see a note on when this was started but let him know that I would check into this. Will route message to provider before sending in refill.

## 2016-07-18 NOTE — Telephone Encounter (Signed)
Pt calling stating we prescribed him some Renexa in [place] he only had a 30 refill on that.   He does not have a refill on that  Would like to know if he is to stay on this or not Please advise.

## 2016-07-25 ENCOUNTER — Other Ambulatory Visit: Payer: Self-pay | Admitting: *Deleted

## 2016-07-25 MED ORDER — RANOLAZINE ER 500 MG PO TB12: 500.0000 mg | ORAL_TABLET | Freq: Two times a day (BID) | ORAL | 6 refills | Status: DC

## 2016-08-01 DIAGNOSIS — H903 Sensorineural hearing loss, bilateral: Secondary | ICD-10-CM | POA: Diagnosis not present

## 2016-08-05 ENCOUNTER — Other Ambulatory Visit: Payer: Self-pay | Admitting: Physician Assistant

## 2016-08-06 ENCOUNTER — Other Ambulatory Visit: Payer: Self-pay | Admitting: Physician Assistant

## 2016-08-06 NOTE — Telephone Encounter (Signed)
Medication Detail    Disp Refills Start End   carvedilol (COREG) 25 MG tablet 180 tablet 2 08/06/2016    Sig: TAKE 1 TABLET(25 MG) BY MOUTH TWICE DAILY WITH A MEAL   E-Prescribing Status: Receipt confirmed by pharmacy (08/06/2016 11:43 AM EDT)   Pharmacy   [id number] - [place], [place]

## 2016-08-31 ENCOUNTER — Telehealth: Payer: Self-pay | Admitting: Internal Medicine

## 2016-08-31 MED ORDER — CARVEDILOL 25 MG PO TABS: ORAL_TABLET | ORAL | 2 refills | Status: DC

## 2016-08-31 NOTE — Telephone Encounter (Signed)
Refilled Coreg 25 mg BID for patient at [facility], [facility] Cardiology

## 2016-09-03 ENCOUNTER — Other Ambulatory Visit: Payer: Self-pay | Admitting: Internal Medicine

## 2016-09-03 MED ORDER — CARVEDILOL 25 MG PO TABS: ORAL_TABLET | ORAL | 2 refills | Status: DC

## 2016-09-07 ENCOUNTER — Other Ambulatory Visit: Payer: Self-pay | Admitting: Internal Medicine

## 2016-09-11 ENCOUNTER — Ambulatory Visit (INDEPENDENT_AMBULATORY_CARE_PROVIDER_SITE_OTHER): Payer: 59 | Admitting: Internal Medicine

## 2016-09-11 ENCOUNTER — Encounter: Payer: Self-pay | Admitting: Internal Medicine

## 2016-09-11 VITALS — BP 110/72 | HR 61 | Ht 70.0 in | Wt 205.5 lb

## 2016-09-11 DIAGNOSIS — Z95810 Presence of automatic (implantable) cardiac defibrillator: Secondary | ICD-10-CM

## 2016-09-11 DIAGNOSIS — I359 Nonrheumatic aortic valve disorder, unspecified: Secondary | ICD-10-CM | POA: Diagnosis not present

## 2016-09-11 DIAGNOSIS — I472 Ventricular tachycardia, unspecified: Secondary | ICD-10-CM

## 2016-09-11 LAB — CUP PACEART INCLINIC DEVICE CHECK
Brady Statistic RV Percent Paced: 1 % — CL
HighPow Impedance: 53 Ohm
Implantable Lead Implant Date: 20080214
Implantable Lead Model: 185
Lead Channel Pacing Threshold Amplitude: 1 V
Lead Channel Pacing Threshold Pulse Width: 0.4 ms
Lead Channel Setting Sensing Sensitivity: 0.6 mV
MDC IDC LEAD LOCATION: 753860
MDC IDC LEAD SERIAL: 178017
MDC IDC MSMT BATTERY REMAINING LONGEVITY: 125 mo
MDC IDC MSMT LEADCHNL RV IMPEDANCE VALUE: 586 Ohm
MDC IDC MSMT LEADCHNL RV SENSING INTR AMPL: 25 mV — AB
MDC IDC PG IMPLANT DT: 20141216
MDC IDC PG SERIAL: 126781
MDC IDC SESS DTM: 20180424163810
MDC IDC SET LEADCHNL RV PACING AMPLITUDE: 2.4 V
MDC IDC SET LEADCHNL RV PACING PULSEWIDTH: 0.4 ms

## 2016-09-11 NOTE — Patient Instructions (Signed)
Medication Instructions: - Your physician recommends that you continue on your current medications as directed. Please refer to the Current Medication list given to you today.  Labwork: - Your physician recommends that you have lab work today: BMP/Magnesium  Procedures/Testing: - Your physician has requested that you have an echocardiogram. Echocardiography is a painless test that uses sound waves to create images of your heart. It provides your doctor with information about the size and shape of your heart and how well your heart's chambers and valves are working. This procedure takes approximately one hour. There are no restrictions for this procedure.  Follow-Up: - Your physician recommends that you schedule a follow-up appointment in: 3 months with Dr. Caryl Comes.   Any Additional Special Instructions Will Be Listed Below (If Applicable).     If you need a refill on your cardiac medications before your next appointment, please call your pharmacy.

## 2016-09-11 NOTE — Progress Notes (Signed)
Patient Care Team: Arnetha Courser, MD as PCP - General (Family Medicine) Carloyn Manner, MD as Referring Physician (Otolaryngology)   HPI  [name] is a 41 y.o. male Seen in followup for ventricular tachycardia occurring in the context of surgically corrected congenital heart disease. With Aortic valve replacement . He is status post ICD implantation. 4/13 had appropriate shock for fast ventricular tachycardia He underwent device generator replacement 12/14    Echo March 2013 demonstrated left ventricular dysfunction with an ejection fraction of 40-45% with inferior posterior akinesis and aortic valve bioprosthesis. 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  Echo 12/14 demonstrated mild worsening of LV function 35-40%.with questions regarding aortic valve and possible stenosis with a bioprosthesis;  TEE 1/15 was undertaken by Dr. Deidre Ala demonstrated that normal LV function no significant aortic stenosis with well visualized aortic valve opening.   DATE TEST    3/13    Echo   EF 40-45 % IP akinesis   1/15    TEE   EF 60 % "mechanical aortic valve"  2/17 Echo 45-50%   12/17 Echo EF 35-40 "bioprosthesis "     12/17  Admitted [place] for syncopal VT PM  Antecedent VT PM in wake of viral illness  Ranexa and MAG added Seen 1/18 at that time without interval VT  Modest exercise tolerance   No palps  Some lightheadedness with standing now that he has lost weight  Working hard on diet and fitness  Psychologically he is doing well focusing on the above and trying not to think about the VT of 12/17      Past Medical History:  Diagnosis Date  . Anxiety   . Aortic valve replaced    Requiring replacement, specifics not available  . Hearing loss of both ears 12/22/2015  . Heart murmur   . Hypertension   . IBD (inflammatory bowel disease) 09/23/2015  . ICD (implantable cardiac  defibrillator), BSX single   . Obesity 12/22/2015  . Secondary cardiomyopathy ([place])   . Syncope   . Ventricular septal defect   . Ventricular tachycardia ([place])    appropriate VT shock therapy /13    Past Surgical History:  Procedure Laterality Date  . ANGIOPLASTY     RCA repair with vein angioplasty following injury with the aforementioned surgery  . AORTIC VALVE REPLACEMENT     Bioprosthesis  . CARDIAC DEFIBRILLATOR PLACEMENT    . CARDIAC VALVE REPLACEMENT    . CHOLECYSTECTOMY    . COLONOSCOPY WITH PROPOFOL N/A 12/06/2015   Procedure: COLONOSCOPY WITH PROPOFOL;  Surgeon: [name], MD;  Location: [facility] ENDOSCOPY;  Service: Endoscopy;  Laterality: N/A;  . CORONARY ARTERY BYPASS GRAFT    . IMPLANTABLE CARDIOVERTER DEFIBRILLATOR GENERATOR CHANGE N/A 05/05/2013   Procedure: IMPLANTABLE CARDIOVERTER DEFIBRILLATOR GENERATOR CHANGE;  Surgeon: Deboraha Sprang, MD;  [facility] CATH LAB;  Service: Cardiovascular;  Laterality: N/A;  . INSERT / REPLACE / REMOVE PACEMAKER    . VSD REPAIR      Current Outpatient Prescriptions  Medication Sig Dispense Refill  . ATROVENT HFA 17 MCG/ACT inhaler Inhale 2 puffs into the lungs 4 (four) times daily as needed.    . carvedilol (COREG) 25 MG tablet TAKE 1 TABLET(25 MG) BY MOUTH TWICE DAILY WITH A MEAL 180 tablet 2  . FLUoxetine (PROZAC) 20 MG capsule Take 1 capsule (20 mg total) by mouth daily. [place]  capsule 9  . fluticasone (FLONASE) 50 MCG/ACT nasal spray Place 2 sprays into both nostrils daily. 16 g 2  . loratadine (CLARITIN) 5 MG chewable tablet Chew 5 mg by mouth daily as needed for allergies.    . magnesium oxide (MAG-OX) 400 (241.3 Mg) MG tablet Take 1 tablet (400 mg total) by mouth 2 (two) times daily. 60 tablet 0  . ramipril (ALTACE) 10 MG capsule TAKE 1 CAPSULE BY MOUTH EVERY NIGHT AT BEDTIME 90 capsule 2  . ranolazine (RANEXA) 500 MG 12 hr tablet Take 1 tablet (500 mg total) by mouth 2 (two) times daily. 60 tablet 6   No current  facility-administered medications for this visit.     Allergies  Allergen Reactions  . Shellfish Allergy Nausea And Vomiting    Review of Systems negative except from HPI and PMH  Physical Exam BP 110/72 (BP Location: Left Arm, Patient Position: Sitting, Cuff Size: Normal)   Pulse 61   Ht 5\' 10"  (1.778 m)   Wt 205 lb 8 oz (93.2 kg)   BMI 29.49 kg/m  Well developed and well nourished in no acute distress HENT normal E scleral and icterus clear Neck Supple JVP flat; carotids brisk and full Clear to ausculation Device pocket well healed; without hematoma or erythema.  There is no tethering Regular rate and rhythm, 2/6 murmur Soft with active bowel sounds No clubbing cyanosis no  Edema Alert and oriented, grossly normal motor and sensory function Skin Warm and Dry   ECG personally reviewed NSR with QRS 118 and freq PVC with LBBB morphology   Assessment and plan  Ventricular tachycardia   Hypertension   Lightheadedness  Aortic valve-bioprosthesis   ICD-[employer]  The patient's device was interrogated.  The information was reviewed. No changes were made in the programming.      Cardiomyopathy-secondary  PVCs-frequent    recurrent VT-NS  Up to 6 sec-- worrisome   Will check electrolytes and will need intense followup  Lightheadedness 2/2 decrease BP w weight loss.  If persists, would have him decrease altace as data for 10>>5 prob not significant  Will reassess LVEF-- if worsening would have to presume 2/2 PVC and would need to target them, probably procedurally.  If stable will follow  More than 50% of 45 min was spent in counseling related to the above

## 2016-09-12 MED ORDER — RANOLAZINE ER 500 MG PO TB12
500.0000 mg | ORAL_TABLET | Freq: Two times a day (BID) | ORAL | 3 refills | Status: DC
Start: 2016-09-12 — End: 2016-09-14

## 2016-09-12 NOTE — Addendum Note (Signed)
Addended by: Derl Barrow on: 09/12/2016 10:02 AM   Modules accepted: Orders

## 2016-09-12 NOTE — Telephone Encounter (Signed)
Requested a mail order pharmacy. Rx resent.

## 2016-09-13 LAB — BASIC METABOLIC PANEL
BUN/Creatinine Ratio: 12 (ref 9–20)
BUN: 11 mg/dL (ref 6–24)
CHLORIDE: 105 mmol/L (ref 96–106)
CO2: 26 mmol/L (ref 18–29)
Calcium: 9.6 mg/dL (ref 8.7–10.2)
Creatinine, Ser: 0.95 mg/dL (ref 0.76–1.27)
GFR calc Af Amer: 114 mL/min/{1.73_m2} (ref >59)
GFR calc non Af Amer: 98 mL/min/{1.73_m2} (ref >59)
GLUCOSE: 95 mg/dL (ref 65–99)
Potassium: 4.8 mmol/L (ref 3.5–5.2)
Sodium: 143 mmol/L (ref 134–144)

## 2016-09-13 LAB — MAGNESIUM: MAGNESIUM: 2.1 mg/dL (ref 1.6–2.3)

## 2016-09-14 ENCOUNTER — Other Ambulatory Visit: Payer: Self-pay | Admitting: Internal Medicine

## 2016-09-14 MED ORDER — RANOLAZINE ER 500 MG PO TB12
500.0000 mg | ORAL_TABLET | Freq: Two times a day (BID) | ORAL | 3 refills | Status: DC
Start: 2016-09-14 — End: 2017-08-27

## 2016-09-20 ENCOUNTER — Telehealth: Payer: Self-pay

## 2016-09-20 NOTE — Telephone Encounter (Signed)
Patient is aware and agreeable to normal results. Patient stated to me that his pharmacy ([facility]) contacted him about a his RX renewal for his Ranexa. I told him we sent and update RX in April and we have a receipt confirmation that they received the renewal. He was agreeable.

## 2016-10-12 ENCOUNTER — Ambulatory Visit (INDEPENDENT_AMBULATORY_CARE_PROVIDER_SITE_OTHER): Payer: 59

## 2016-10-12 ENCOUNTER — Other Ambulatory Visit: Payer: Self-pay

## 2016-10-12 DIAGNOSIS — I472 Ventricular tachycardia, unspecified: Secondary | ICD-10-CM

## 2016-10-19 ENCOUNTER — Other Ambulatory Visit: Payer: Self-pay | Admitting: *Deleted

## 2016-10-19 DIAGNOSIS — I493 Ventricular premature depolarization: Secondary | ICD-10-CM

## 2016-10-22 ENCOUNTER — Ambulatory Visit (INDEPENDENT_AMBULATORY_CARE_PROVIDER_SITE_OTHER): Payer: 59

## 2016-10-22 DIAGNOSIS — I493 Ventricular premature depolarization: Secondary | ICD-10-CM | POA: Diagnosis not present

## 2016-10-30 ENCOUNTER — Ambulatory Visit
Admission: RE | Admit: 2016-10-30 | Discharge: 2016-10-30 | Disposition: A | Discharge status: Home / Self Care | Payer: 59 | Source: Ambulatory Visit | Attending: Internal Medicine | Admitting: Internal Medicine

## 2016-10-30 DIAGNOSIS — I493 Ventricular premature depolarization: Secondary | ICD-10-CM | POA: Insufficient documentation

## 2016-11-22 ENCOUNTER — Telehealth: Payer: Self-pay

## 2016-11-22 NOTE — Telephone Encounter (Signed)
Pt is aware and agreeable to monitor results. Patient states he already saw the results on my chart by appreciated the call because he has been experiencing some "vertigo like" symptoms and some SOB which he attributes to finding on the monitor and would like to come in to talk to Dr. Caryl Comes about them as soon as possible. I informed him that I would send a message to our scheduler Lorenda Hatchet and to Dr. Caryl Comes about his symptoms and see what we could arrange. We are agreeable to plan

## 2016-11-28 ENCOUNTER — Ambulatory Visit (INDEPENDENT_AMBULATORY_CARE_PROVIDER_SITE_OTHER): Payer: 59 | Admitting: Family Medicine

## 2016-11-28 ENCOUNTER — Encounter: Payer: Self-pay | Admitting: Family Medicine

## 2016-11-28 VITALS — BP 116/68 | HR 69 | Temp 97.3°F | Resp 14 | Ht 70.0 in | Wt 201.7 lb

## 2016-11-28 DIAGNOSIS — N529 Male erectile dysfunction, unspecified: Secondary | ICD-10-CM

## 2016-11-28 DIAGNOSIS — R300 Dysuria: Secondary | ICD-10-CM | POA: Diagnosis not present

## 2016-11-28 DIAGNOSIS — Z13220 Encounter for screening for lipoid disorders: Secondary | ICD-10-CM | POA: Diagnosis not present

## 2016-11-28 DIAGNOSIS — Z0289 Encounter for other administrative examinations: Secondary | ICD-10-CM | POA: Diagnosis not present

## 2016-11-28 DIAGNOSIS — Z131 Encounter for screening for diabetes mellitus: Secondary | ICD-10-CM

## 2016-11-28 MED ORDER — SILDENAFIL CITRATE 20 MG PO TABS: 40.0000 mg | ORAL_TABLET | Freq: Every day | ORAL | 5 refills | Status: DC | PRN

## 2016-11-28 NOTE — Progress Notes (Signed)
BP 116/68   Pulse 69   Temp (!) 97.3 F (36.3 C) (Oral)   Resp 14   Ht 5\' 10"  (1.778 m)   Wt 201 lb 11.2 oz (91.5 kg)   SpO2 97%   BMI 28.94 kg/m    Subjective:    Patient ID: [name], male    DOB: Sep 15, 1975, 41 y.o.   MRN: [id number]  HPI: [name] is a 41 y.o. male  Chief Complaint  Patient presents with  . wellnees form   HPI Patient is here for a wellness form Not fasting for labs  He stopped the fluoxetine abruptly; he had some symptoms more like vertigo more than anything else Like vertigo, comes and goes, some side effects, consistent with stopping SSRI Just did not need it  Allergies are doing well in the summer; worse in spring and fall  Having difficulty with maintaining erections Cannot remember waking up with erection No pain with erections or ejaculations; no blood Just rare burning with urination, nothing concerning  Depression screen [facility] 2/9 11/28/2016 05/29/2016 12/22/2015 09/15/2015 07/29/2015  Decreased Interest 0 0 0 0 0  Down, Depressed, Hopeless 0 0 0 0 0  PHQ - 2 Score 0 0 0 0 0    Relevant past medical, surgical, family and social history reviewed Past Medical History:  Diagnosis Date  . Anxiety   . Aortic valve replaced    Requiring replacement, specifics not available  . Hearing loss of both ears 12/22/2015  . Heart murmur   . Hypertension   . IBD (inflammatory bowel disease) 09/23/2015  . ICD (implantable cardiac defibrillator), BSX single   . Obesity 12/22/2015  . Secondary cardiomyopathy ([place])   . Syncope   . Ventricular septal defect   . Ventricular tachycardia ([place])    appropriate VT shock therapy /13   Past Surgical History:  Procedure Laterality Date  . ANGIOPLASTY     RCA repair with vein angioplasty following injury with the aforementioned surgery  . AORTIC VALVE REPLACEMENT     Bioprosthesis  . CARDIAC DEFIBRILLATOR PLACEMENT    . CARDIAC VALVE REPLACEMENT    . CHOLECYSTECTOMY    . COLONOSCOPY WITH PROPOFOL  N/A 12/06/2015   Procedure: COLONOSCOPY WITH PROPOFOL;  Surgeon: [name], MD;  Location: [facility] ENDOSCOPY;  Service: Endoscopy;  Laterality: N/A;  . CORONARY ARTERY BYPASS GRAFT    . IMPLANTABLE CARDIOVERTER DEFIBRILLATOR GENERATOR CHANGE N/A 05/05/2013   Procedure: IMPLANTABLE CARDIOVERTER DEFIBRILLATOR GENERATOR CHANGE;  Surgeon: Deboraha Sprang, MD;  Location: [facility] CATH LAB;  Service: Cardiovascular;  Laterality: N/A;  . INSERT / REPLACE / REMOVE PACEMAKER    . VSD REPAIR     Family History  Problem Relation Age of Onset  . Heart disease Father    Social History   Social History  . Marital status: Married    Spouse name: N/A  . Number of children: N/A  . Years of education: N/A   Occupational History  . 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 Smoking status: Never Smoker  . Smokeless tobacco: Never Used  . Alcohol use No  . Drug use: No  . Sexual activity: Yes   Other Topics Concern  . Not on file   Social History Narrative   Married    Interim medical history since last visit reviewed. Allergies and medications reviewed  Review of Systems Per HPI unless specifically indicated above  Objective:    BP 116/68   Pulse 69   Temp (!) 97.3 F (36.3 C) (Oral)   Resp 14   Ht 5\' 10"  (1.778 m)   Wt 201 lb 11.2 oz (91.5 kg)   SpO2 97%   BMI 28.94 kg/m   Wt Readings from Last 3 Encounters:  11/29/16 202 lb 8 oz (91.9 kg)  11/28/16 201 lb 11.2 oz (91.5 kg)  09/11/16 205 lb 8 oz (93.2 kg)    Physical Exam  Constitutional: He appears well-developed and well-nourished. No distress.  Eyes: No scleral icterus.  Cardiovascular: Normal rate and regular rhythm.   Pulmonary/Chest: Effort normal and breath sounds normal.  Neurological: He is alert.  Skin: No pallor.  Psychiatric: He has a normal mood and affect.      Assessment & Plan:   Problem List Items Addressed This Visit      Genitourinary   ED (erectile dysfunction)     Patient reports ED; will prescribe sildenafil, but advised he must FIRST clear this with his cardiologist; he agrees; Rx written       Other Visit Diagnoses    Dysuria    -  Primary   check urinalysis   Relevant Orders   Urinalysis, Routine w reflex microscopic   Encounter for completion of form with patient       wellness form reviewed, will draw labs at patient's request   Screening for diabetes mellitus       check glucose and A1c   Relevant Orders   LP+Creat+Hb A1c (Completed)   Glucose (Completed)   Screening cholesterol level       check lipids   Relevant Orders   LP+Creat+Hb A1c (Completed)       Follow up plan: No Follow-up on file.  An after-visit summary was printed and given to the patient at [facility] [facility].  Please see the patient instructions which may contain other information and recommendations beyond what is mentioned above in the assessment and plan.  Meds ordered this encounter  Medications  . sildenafil (REVATIO) 20 MG tablet    Sig: Take 2-3 tablets (40-60 mg total) by mouth daily as needed. Never combine with nitrates    Dispense:  30 tablet    Refill:  5    Orders Placed This Encounter  Procedures  . LP+Creat+Hb A1c  . Glucose  . Urinalysis, Routine w reflex microscopic  . Urinalysis, Routine w reflex microscopic

## 2016-11-28 NOTE — Patient Instructions (Addendum)
Have labs done today and we'll complete the form Try to lose maybe five pounds Ask your cardiologist first if he agrees with using generic Viagra Okay to use if he gives his blessing If not, there are other treatments and call me

## 2016-11-29 ENCOUNTER — Encounter: Payer: Self-pay | Admitting: Internal Medicine

## 2016-11-29 ENCOUNTER — Ambulatory Visit (INDEPENDENT_AMBULATORY_CARE_PROVIDER_SITE_OTHER): Payer: 59 | Admitting: Internal Medicine

## 2016-11-29 VITALS — BP 109/65 | HR 64 | Ht 70.0 in | Wt 202.5 lb

## 2016-11-29 DIAGNOSIS — I429 Cardiomyopathy, unspecified: Secondary | ICD-10-CM | POA: Diagnosis not present

## 2016-11-29 DIAGNOSIS — Z95810 Presence of automatic (implantable) cardiac defibrillator: Secondary | ICD-10-CM

## 2016-11-29 DIAGNOSIS — I472 Ventricular tachycardia, unspecified: Secondary | ICD-10-CM

## 2016-11-29 DIAGNOSIS — I493 Ventricular premature depolarization: Secondary | ICD-10-CM | POA: Diagnosis not present

## 2016-11-29 LAB — URINALYSIS, ROUTINE W REFLEX MICROSCOPIC
BILIRUBIN UA: NEGATIVE
GLUCOSE, UA: NEGATIVE
KETONES UA: NEGATIVE
Leukocytes, UA: NEGATIVE
NITRITE UA: NEGATIVE
PROTEIN UA: NEGATIVE
RBC UA: NEGATIVE
SPEC GRAV UA: 1.017 (ref 1.005–1.030)
UUROB: 0.2 mg/dL (ref 0.2–1.0)
pH, UA: 5.5 (ref 5.0–7.5)

## 2016-11-29 LAB — LP+CREAT+HB A1C
CHOL/HDL RATIO: 3.4 ratio (ref 0.0–5.0)
CREATININE: 1.06 mg/dL (ref 0.76–1.27)
Cholesterol, Total: 144 mg/dL (ref 100–199)
GFR calc Af Amer: 100 mL/min/{1.73_m2} (ref >59)
GFR, EST NON AFRICAN AMERICAN: 86 mL/min/{1.73_m2} (ref >59)
HDL: 42 mg/dL (ref >39)
HEMOGLOBIN A1C: 4.7 % — AB (ref 4.8–5.6)
LDL CALC: 88 mg/dL (ref 0–99)
TRIGLYCERIDES: 68 mg/dL (ref 0–149)
VLDL Cholesterol Cal: 14 mg/dL (ref 5–40)

## 2016-11-29 LAB — GLUCOSE, RANDOM: GLUCOSE: 102 mg/dL — AB (ref 65–99)

## 2016-11-29 NOTE — Patient Instructions (Signed)
Medication Instructions: - Your physician recommends that you continue on your current medications as directed. Please refer to the Current Medication list given to you today.  Labwork: - none ordered  Procedures/Testing: - none ordered  Follow-Up: - Your physician wants you to follow-up in: 6 months with Dr. Caryl Comes. You will receive a reminder letter in the mail two months in advance. If you don't receive a letter, please call our office to schedule the follow-up appointment.   Any Additional Special Instructions Will Be Listed Below (If Applicable).  ** We will be back in touch with you after Dr. Caryl Comes speaks with Dr. Fletcher Anon (cath films) & Dr. Haroldine Laws (possible med change)   If you need a refill on your cardiac medications before your next appointment, please call your pharmacy.

## 2016-11-29 NOTE — Progress Notes (Signed)
Patient Care Team: Lada, Satira Anis, MD as PCP - General (Family Medicine) Carloyn Manner, MD as Referring Physician (Otolaryngology)   HPI  [name] is a 41 y.o. male Seen in followup for ventricular tachycardia occurring in the context of surgically corrected congenital heart disease With Aortic valve replacement.  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  My note from 2011 reports that at the time of surgery "he had damage to his right coronary artery and underwent repair." He is status post ICD implantation. 4/13 had appropriate shock for fast ventricular tachycardia He underwent device generator replacement 12/14    Echo March 2013 demonstrated left ventricular dysfunction with an ejection fraction of 40-45% with inferior posterior akinesis and aortic valve bioprosthesis. 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  Echo 12/14 demonstrated mild worsening of LV function 35-40%.with questions regarding aortic valve and possible stenosis with a bioprosthesis;  TEE 1/15 was undertaken by Dr. Deidre Ala demonstrated that normal LV function no significant aortic stenosis with well visualized aortic valve opening.   DATE TEST    3/13    Echo   EF 40-45 % IP akinesis   1/15    TEE   EF 60 % "mechanical aortic valve"  2/17 Echo 45-50%   12/17 Echo EF 35-40 "bioprosthesis "  5/17 Echo EF 30-35%         12/17  Admitted [place] for syncopal VT PM  Antecedent VT PM in wake of viral illness  Ranexa and MAG added   DATE TEST    6/18    HOLTER <1 % PVCs       Without cherst pain and exercising well   No sob or edema; orthostatic lightheadedness   Moving so family stressed    Past Medical History:  Diagnosis Date  . Anxiety   . Aortic valve replaced    Requiring  replacement, specifics not available  . Hearing loss of both ears 12/22/2015  . Heart murmur   . Hypertension   . IBD (inflammatory bowel disease) 09/23/2015  . ICD (implantable cardiac defibrillator), BSX single   . Obesity 12/22/2015  . Secondary cardiomyopathy ([place])   . Syncope   . Ventricular septal defect   . Ventricular tachycardia ([place])    appropriate VT shock therapy /13    Past Surgical History:  Procedure Laterality Date  . ANGIOPLASTY     RCA repair with vein angioplasty following injury with the aforementioned surgery  . AORTIC VALVE REPLACEMENT     Bioprosthesis  . CARDIAC DEFIBRILLATOR PLACEMENT    . CARDIAC VALVE REPLACEMENT    . CHOLECYSTECTOMY    . COLONOSCOPY WITH PROPOFOL N/A 12/06/2015   Procedure: COLONOSCOPY WITH PROPOFOL;  Surgeon: [name], MD;  Location: [facility] ENDOSCOPY;  Service: Endoscopy;  Laterality: N/A;  . CORONARY ARTERY BYPASS GRAFT    . IMPLANTABLE CARDIOVERTER DEFIBRILLATOR GENERATOR CHANGE N/A 05/05/2013   Procedure: IMPLANTABLE CARDIOVERTER DEFIBRILLATOR GENERATOR CHANGE;  Surgeon: Deboraha Sprang, MD;  Location: [facility] CATH LAB;  Service: Cardiovascular;  Laterality: N/A;  . INSERT / REPLACE / REMOVE PACEMAKER    . VSD REPAIR      Current Outpatient Prescriptions  Medication Sig Dispense Refill  . ATROVENT HFA 17 MCG/ACT inhaler Inhale 2 puffs into the lungs 4 (four) times daily  as needed.    . carvedilol (COREG) 25 MG tablet TAKE 1 TABLET(25 MG) BY MOUTH TWICE DAILY WITH A MEAL 180 tablet 2  . fluticasone (FLONASE) 50 MCG/ACT nasal spray Place 2 sprays into both nostrils daily. 16 g 2  . loratadine (CLARITIN) 5 MG chewable tablet Chew 5 mg by mouth daily as needed for allergies.    . magnesium oxide (MAG-OX) 400 (241.3 Mg) MG tablet Take 1 tablet (400 mg total) by mouth 2 (two) times daily. 60 tablet 0  . ramipril (ALTACE) 10 MG capsule TAKE 1 CAPSULE BY MOUTH EVERY NIGHT AT BEDTIME 90 capsule 2  . ranolazine (RANEXA) 500 MG 12 hr tablet Take 1  tablet (500 mg total) by mouth 2 (two) times daily. 180 tablet 3  . sildenafil (REVATIO) 20 MG tablet Take 2-3 tablets (40-60 mg total) by mouth daily as needed. Never combine with nitrates 30 tablet 5   No current facility-administered medications for this visit.     Allergies  Allergen Reactions  . Shellfish Allergy Nausea And Vomiting    Review of Systems negative except from HPI and PMH  Physical Exam BP 109/65 (BP Location: Right Arm, Patient Position: Sitting, Cuff Size: Large)   Pulse 64   Ht 5\' 10"  (1.778 m)   Wt 202 lb 8 oz (91.9 kg)   BMI 29.06 kg/m  Well developed and well nourished in no acute distress HENT normal E scleral and icterus clear Neck Supple JVP flat; carotids brisk and full Clear to ausculation Device pocket well healed; without hematoma or erythema.  There is no tethering Regular rate and rhythm, 2/6 murmur Soft with active bowel sounds No clubbing cyanosis no  Edema Alert and oriented, grossly normal motor and sensory function Skin Warm and Dry    ECG personally reviewed  Sinus at 64 Intervals 15/12/44 Assessment and plan  Ventricular tachycardia   Hypertension   Lightheadedness  Aortic valve-bioprosthesis   ICD-[employer]  The patient's device was interrogated.  The information was reviewed. No changes were made in the programming.      Cardiomyopathy-secondary  PVCs-frequent   continues to be ongoing deterioration of his cardiomyopathy. Mechanism is not clear. I am inclined to consider catheterization and will review that history with Dr. Gaylyn Cheers today. There may be a role for CT scanning in planning a procedure given his history of anomalous artery surgical disruption and need for repair.    given his LV dysfunction,    I have spoken with Dr. Reine Just. We will plan to discontinue his ramipril and place him on Entresto 24/26 given his orthostatic lightheadedness. We will also decrease his carvedilol from 25--12.5 twice a day.  No  intercurrent Ventricular tachycardia  More than 50% of 45 min was spent in counseling related to the above

## 2016-12-01 DIAGNOSIS — N529 Male erectile dysfunction, unspecified: Secondary | ICD-10-CM | POA: Insufficient documentation

## 2016-12-01 NOTE — Assessment & Plan Note (Signed)
Patient reports ED; will prescribe sildenafil, but advised he must FIRST clear this with his cardiologist; he agrees; Rx written

## 2016-12-06 ENCOUNTER — Telehealth: Payer: Self-pay | Admitting: Internal Medicine

## 2016-12-06 DIAGNOSIS — I2510 Atherosclerotic heart disease of native coronary artery without angina pectoris: Secondary | ICD-10-CM

## 2016-12-06 NOTE — Telephone Encounter (Signed)
I called the patient to notify him of the follow up plan for his medication and further testing after Dr. Olin Pia discussion with Dr. Fletcher Anon and Dr. Haroldine Laws- now was not a good time for him to talk- he will call me back later today.  Plan:  1) Stop ramipril 2) Start entresto 24/26 mg BID 3) Decrease coreg 12.5 mg BID 4) Cardia CTA (dx- CAD)  Will await a call back from the patient.

## 2016-12-10 MED ORDER — SACUBITRIL-VALSARTAN 24-26 MG PO TABS: 1.0000 | ORAL_TABLET | Freq: Two times a day (BID) | ORAL | 11 refills | Status: DC

## 2016-12-10 MED ORDER — CARVEDILOL 12.5 MG PO TABS: 12.5000 mg | ORAL_TABLET | Freq: Two times a day (BID) | ORAL | 11 refills | Status: DC

## 2016-12-10 NOTE — Telephone Encounter (Signed)
F/U call:  Patient returning call from previous conversation on 12-06-16. Patient is aware that Nira Conn is out of office today.

## 2016-12-10 NOTE — Telephone Encounter (Signed)
Spoke w/ pt.   Advised him that I am leaving samples of Entresto 24/26 mg at the front desk for him to p/u at his convenience. He is appreciative and should be here today around 4:30.

## 2016-12-10 NOTE — Telephone Encounter (Signed)
Instructed patient to: 1) STOP RAMIPRIL  2) START ENTRESTO 24/26 mg BID 3) DECREASE COREG to 12.5 mg BID Patient understands to wait 36 hours after last dose of Ramipril prior to starting Entresto. Cardiac CT ordered for scheduling. Patient agrees with treatment plan.  Patient requests a call from the [place] office to confirm if they have Entresto samples he can have.

## 2016-12-19 ENCOUNTER — Other Ambulatory Visit: Payer: Self-pay | Admitting: *Deleted

## 2016-12-19 ENCOUNTER — Encounter: Payer: Self-pay | Admitting: Internal Medicine

## 2016-12-19 DIAGNOSIS — I472 Ventricular tachycardia, unspecified: Secondary | ICD-10-CM

## 2016-12-24 ENCOUNTER — Other Ambulatory Visit: Payer: 59

## 2016-12-25 ENCOUNTER — Encounter: Payer: Self-pay | Admitting: Family Medicine

## 2016-12-25 ENCOUNTER — Telehealth: Payer: Self-pay | Admitting: General Surgery

## 2016-12-25 ENCOUNTER — Ambulatory Visit (INDEPENDENT_AMBULATORY_CARE_PROVIDER_SITE_OTHER): Payer: 59 | Admitting: Family Medicine

## 2016-12-25 ENCOUNTER — Encounter: Payer: Self-pay | Admitting: *Deleted

## 2016-12-25 VITALS — BP 108/62 | HR 83 | Temp 98.0°F | Resp 14 | Ht 69.0 in | Wt 200.0 lb

## 2016-12-25 DIAGNOSIS — R229 Localized swelling, mass and lump, unspecified: Secondary | ICD-10-CM | POA: Diagnosis not present

## 2016-12-25 DIAGNOSIS — Z0000 Encounter for general adult medical examination without abnormal findings: Secondary | ICD-10-CM | POA: Diagnosis not present

## 2016-12-25 DIAGNOSIS — Z125 Encounter for screening for malignant neoplasm of prostate: Secondary | ICD-10-CM | POA: Diagnosis not present

## 2016-12-25 DIAGNOSIS — Z113 Encounter for screening for infections with a predominantly sexual mode of transmission: Secondary | ICD-10-CM

## 2016-12-25 NOTE — Assessment & Plan Note (Signed)
Check per patient request 

## 2016-12-25 NOTE — Assessment & Plan Note (Signed)
Screen per patient's request

## 2016-12-25 NOTE — Telephone Encounter (Signed)
I CALLED PATIENTS H&C#'S.I LEFT A MESSAGE ON HIS H# TO CALL THE OFFICE & SCHEDULE AN APPOINTMENT WITH DR BYRNETT(ON CALL) FOR SOFT MASS RT BUTTOCK,REFFERRED BY DR LADA.

## 2016-12-25 NOTE — Assessment & Plan Note (Signed)
USPSTF grade A and B recommendations reviewed with patient; age-appropriate recommendations, preventive care, screening tests, etc discussed and encouraged; healthy living encouraged; see AVS for patient education given to patient  

## 2016-12-25 NOTE — Patient Instructions (Addendum)
Please have labs done at your earliest convenience I've put in the referral for you If you have not heard anything from my staff in a week about any orders/referrals/studies from today, please contact us here to follow-up [phone]   Health Maintenance, Male A healthy lifestyle and preventive care is important for your health and wellness. Ask your health care provider about what schedule of regular examinations is right for you. What should I know about weight and diet? Eat a Healthy Diet  Eat plenty of vegetables, fruits, whole grains, low-fat dairy products, and lean protein.  Do not eat a lot of foods high in solid fats, added sugars, or salt.  Maintain a Healthy Weight Regular exercise can help you achieve or maintain a healthy weight. You should:  Do at least 150 minutes of exercise each week. The exercise should increase your heart rate and make you sweat (moderate-intensity exercise).  Do strength-training exercises at least twice a week.  Watch Your Levels of Cholesterol and Blood Lipids  Have your blood tested for lipids and cholesterol every 5 years starting at 43 years of age. If you are at high risk for heart disease, you should start having your blood tested when you are 43 years old. You may need to have your cholesterol levels checked more often if: ? Your lipid or cholesterol levels are high. ? You are older than 43 years of age. ? You are at high risk for heart disease.  What should I know about cancer screening? Many types of cancers can be detected early and may often be prevented. Lung Cancer  You should be screened every year for lung cancer if: ? You are a current smoker who has smoked for at least 30 years. ? You are a former smoker who has quit within the past 15 years.  Talk to your health care provider about your screening options, when you should start screening, and how often you should be screened.  Colorectal Cancer  Routine colorectal  cancer screening usually begins at 43 years of age and should be repeated every 5-10 years until you are 42 years old. You may need to be screened more often if early forms of precancerous polyps or small growths are found. Your health care provider may recommend screening at an earlier age if you have risk factors for colon cancer.  Your health care provider may recommend using home test kits to check for hidden blood in the stool.  A small camera at the end of a tube can be used to examine your colon (sigmoidoscopy or colonoscopy). This checks for the earliest forms of colorectal cancer.  Prostate and Testicular Cancer  Depending on your age and overall health, your health care provider may do certain tests to screen for prostate and testicular cancer.  Talk to your health care provider about any symptoms or concerns you have about testicular or prostate cancer.  Skin Cancer  Check your skin from head to toe regularly.  Tell your health care provider about any new moles or changes in moles, especially if: ? There is a change in a mole's size, shape, or color. ? You have a mole that is larger than a pencil eraser.  Always use sunscreen. Apply sunscreen liberally and repeat throughout the day.  Protect yourself by wearing long sleeves, pants, a wide-brimmed hat, and sunglasses when outside.  What should I know about heart disease, diabetes, and high blood pressure?  If you are 28-41 years of age, have  your blood pressure checked every 3-5 years. If you are 71 years of age or older, have your blood pressure checked every year. You should have your blood pressure measured twice-once when you are at a hospital or clinic, and once when you are not at a hospital or clinic. Record the average of the two measurements. To check your blood pressure when you are not at a hospital or clinic, you can use: ? An automated blood pressure machine at a pharmacy. ? A home blood pressure monitor.  Talk to  your health care provider about your target blood pressure.  If you are between 34-63 years old, ask your health care provider if you should take aspirin to prevent heart disease.  Have regular diabetes screenings by checking your fasting blood sugar level. ? If you are at a normal weight and have a low risk for diabetes, have this test once every three years after the age of 10. ? If you are overweight and have a high risk for diabetes, consider being tested at a younger age or more often.  A one-time screening for abdominal aortic aneurysm (AAA) by ultrasound is recommended for men aged 19-75 years who are current or former smokers. What should I know about preventing infection? Hepatitis B If you have a higher risk for hepatitis B, you should be screened for this virus. Talk with your health care provider to find out if you are at risk for hepatitis B infection. Hepatitis C Blood testing is recommended for:  Everyone born from [birth year] through 1967.  Anyone with known risk factors for hepatitis C.  Sexually Transmitted Diseases (STDs)  You should be screened each year for STDs including gonorrhea and chlamydia if: ? You are sexually active and are younger than 43 years of age. ? You are older than 43 years of age and your health care provider tells you that you are at risk for this type of infection. ? Your sexual activity has changed since you were last screened and you are at an increased risk for chlamydia or gonorrhea. Ask your health care provider if you are at risk.  Talk with your health care provider about whether you are at high risk of being infected with HIV. Your health care provider may recommend a prescription medicine to help prevent HIV infection.  What else can I do?  Schedule regular health, dental, and eye exams.  Stay current with your vaccines (immunizations).  Do not use any tobacco products, such as cigarettes, chewing tobacco, and e-cigarettes. If you need  help quitting, ask your health care provider.  Limit alcohol intake to no more than 2 drinks per day. One drink equals 12 ounces of beer, 5 ounces of wine, or 1 ounces of hard liquor.  Do not use street drugs.  Do not share needles.  Ask your health care provider for help if you need support or information about quitting drugs.  Tell your health care provider if you often feel depressed.  Tell your health care provider if you have ever been abused or do not feel safe at home. This information is not intended to replace advice given to you by your health care provider. Make sure you discuss any questions you have with your health care provider. Document Released: 11/03/2007 Document Revised: 01/04/2016 Document Reviewed: 02/08/2015 Elsevier Interactive Patient Education  [employer].

## 2016-12-25 NOTE — Progress Notes (Signed)
Patient ID: [name], male   DOB: August 08, 1975, 41 y.o.   MRN: [id number]   Subjective:   [name] is a 41 y.o. male here for a complete physical exam  Interim issues since last visit: having a lump for a few years, may be growing; would like it removed; not painful; inner side of the right rear  USPSTF grade A and B recommendations Depression:  Depression screen [facility] 2/9 12/25/2016 11/28/2016 05/29/2016 12/22/2015 09/15/2015  Decreased Interest 0 0 0 0 0  Down, Depressed, Hopeless 0 0 0 0 0  PHQ - 2 Score 0 0 0 0 0   Hypertension: BP Readings from Last 3 Encounters:  12/25/16 108/62  11/29/16 109/65  11/28/16 116/68   Obesity: Wt Readings from Last 3 Encounters:  12/25/16 200 lb (90.7 kg)  11/29/16 202 lb 8 oz (91.9 kg)  11/28/16 201 lb 11.2 oz (91.5 kg)   BMI Readings from Last 3 Encounters:  12/25/16 29.53 kg/m  11/29/16 29.06 kg/m  11/28/16 28.94 kg/m    Alcohol: no Tobacco use: no HIV, hep B, hep C: testing Married STD testing and prevention (chl/gon/syphilis): testing Lipids:  Lab Results  Component Value Date   CHOL 144 11/28/2016   CHOL 174 12/22/2015   CHOL 160 05/11/2015   Lab Results  Component Value Date   HDL 42 11/28/2016   HDL 43 12/22/2015   HDL 40 05/11/2015   Lab Results  Component Value Date   LDLCALC 88 11/28/2016   LDLCALC 115 (H) 12/22/2015   LDLCALC 105 (H) 05/11/2015   Lab Results  Component Value Date   TRIG 68 11/28/2016   TRIG 82 12/22/2015   TRIG 73 05/11/2015   Lab Results  Component Value Date   CHOLHDL 3.4 11/28/2016   CHOLHDL 4.0 12/22/2015   CHOLHDL 4.0 05/11/2015   No results found for: LDLDIRECT Glucose:  Glucose  Date Value Ref Range Status  11/28/2016 102 (H) 65 - 99 mg/dL Final  09/11/2016 95 65 - 99 mg/dL Final  05/24/2016 82 65 - 99 mg/dL Final   Glucose, Bld  Date Value Ref Range Status  05/16/2016 94 65 - 99 mg/dL Final  05/15/2016 135 (H) 65 - 99 mg/dL Final  09/05/2011 107 (H) 70 - 99  mg/dL Final   Glucose-Capillary  Date Value Ref Range Status  09/05/2011 112 (H) 70 - 99 mg/dL Final   Colorectal cancer: no fam hx; start at 55 Prostate cancer: grandfather; no sx No results found for: PSA Lung cancer:  n/a AAA: n/a Aspirin: not taking Diet: eats pretty good Exercise: got away from it; active with moving Skin cancer: no worrisome moles  Past Medical History:  Diagnosis Date  . Anxiety   . Aortic valve replaced    Requiring replacement, specifics not available  . Hearing loss of both ears 12/22/2015  . Heart murmur   . Hypertension   . IBD (inflammatory bowel disease) 09/23/2015  . ICD (implantable cardiac defibrillator), BSX single   . Obesity 12/22/2015  . Secondary cardiomyopathy ([place])   . Syncope   . Ventricular septal defect   . Ventricular tachycardia ([place])    appropriate VT shock therapy /13   Past Surgical History:  Procedure Laterality Date  . ANGIOPLASTY     RCA repair with vein angioplasty following injury with the aforementioned surgery  . AORTIC VALVE REPLACEMENT     Bioprosthesis  . CARDIAC DEFIBRILLATOR PLACEMENT    . CARDIAC VALVE REPLACEMENT    .  CHOLECYSTECTOMY    . COLONOSCOPY WITH PROPOFOL N/A 12/06/2015   Procedure: COLONOSCOPY WITH PROPOFOL;  Surgeon: [name], MD;  Location: [facility] ENDOSCOPY;  Service: Endoscopy;  Laterality: N/A;  . CORONARY ARTERY BYPASS GRAFT    . IMPLANTABLE CARDIOVERTER DEFIBRILLATOR GENERATOR CHANGE N/A 05/05/2013   Procedure: IMPLANTABLE CARDIOVERTER DEFIBRILLATOR GENERATOR CHANGE;  Surgeon: Deboraha Sprang, MD;  Location: [facility] CATH LAB;  Service: Cardiovascular;  Laterality: N/A;  . INSERT / REPLACE / REMOVE PACEMAKER    . VSD REPAIR     Family History  Problem Relation Age of Onset  . Heart disease Father   . Hypertension Maternal Grandmother   . Cancer Maternal Grandmother   . Hypertension Maternal Grandfather   . Cancer Maternal Grandfather    Social History  Substance Use Topics  . Smoking status:  Never Smoker  . Smokeless tobacco: Never Used  . Alcohol use No   Review of Systems  Constitutional: Negative for unexpected weight change.  Eyes: Negative for visual disturbance.  Cardiovascular: Positive for chest pain (minor, but not sure if gas or his heart; heart doctor knows about it).  Gastrointestinal: Negative for blood in stool.  Endocrine: Negative for polydipsia and polyuria.  Genitourinary: Negative for discharge and hematuria.  Musculoskeletal: Negative for arthralgias.       Caught knuckle on band when moving; no redness or fever  Allergic/Immunologic: Positive for food allergies (shrimp).  Neurological: Negative for tremors.  Hematological: Does not bruise/bleed easily.  Psychiatric/Behavioral: Negative for dysphoric mood.    Objective:   Vitals:   12/25/16 0835  BP: 108/62  Pulse: 83  Resp: 14  Temp: 98 F (36.7 C)  TempSrc: Oral  SpO2: 97%  Weight: 200 lb (90.7 kg)  Height: 5\' 9"  (1.753 m)   Body mass index is 29.53 kg/m. Wt Readings from Last 3 Encounters:  12/25/16 200 lb (90.7 kg)  11/29/16 202 lb 8 oz (91.9 kg)  11/28/16 201 lb 11.2 oz (91.5 kg)   Physical Exam  Constitutional: He appears well-developed and well-nourished. No distress.  HENT:  Head: Normocephalic and atraumatic.  Nose: Nose normal.  Mouth/Throat: Oropharynx is clear and moist.  Eyes: EOM are normal. No scleral icterus.  Neck: No JVD present. No thyromegaly present.  Cardiovascular: Normal rate, regular rhythm and normal heart sounds.   Pulmonary/Chest: Effort normal and breath sounds normal. No respiratory distress. He has no wheezes. He has no rales.  Abdominal: Soft. Bowel sounds are normal. He exhibits no distension. There is no tenderness. There is no guarding.  Musculoskeletal: Normal range of motion. He exhibits no edema.  Lymphadenopathy:    He has no cervical adenopathy.  Neurological: He is alert. He displays normal reflexes. He exhibits normal muscle tone.  Coordination normal.  Skin: Skin is warm and dry. No rash noted. He is not diaphoretic. No erythema. No pallor.  Soft nodular swelling RIGHT buttock; no overlying skin changes; no drainage; no redness; no tenderness; about 2 cm across  Psychiatric: He has a normal mood and affect. His behavior is normal. Judgment and thought content normal.   Assessment/Plan:   Problem List Items Addressed This Visit      Other   Screening for STD (sexually transmitted disease)    Screen per patient's request      Relevant Orders   RPR   Hepatitis panel, acute   HIV antibody   GC/Chlamydia Probe Amp   Prostate cancer screening    Check per patient request  Relevant Orders   PSA   Preventative health care - Primary    USPSTF grade A and B recommendations reviewed with patient; age-appropriate recommendations, preventive care, screening tests, etc discussed and encouraged; healthy living encouraged; see AVS for patient education given to patient      Relevant Orders   Comprehensive metabolic panel   CBC with Differential/Platelet   TSH    Other Visit Diagnoses    Mass of skin       Relevant Orders   Ambulatory referral to General Surgery      Meds ordered this encounter  Medications  . Multiple Vitamin (MULTIVITAMIN) tablet    Sig: Take 1 tablet by mouth daily.   Orders Placed This Encounter  Procedures  . GC/Chlamydia Probe Amp  . Comprehensive metabolic panel  . CBC with Differential/Platelet  . TSH  . RPR  . Hepatitis panel, acute  . HIV antibody  . PSA  . Ambulatory referral to General Surgery    Referral Priority:   Routine    Referral Type:   Surgical    Referral Reason:   Specialty Services Required    Requested Specialty:   General Surgery    Number of Visits Requested:   1    Follow up plan: Return in about 1 year (around 12/25/2017) for complete physical.  An After Visit Summary was printed and given to the patient.

## 2016-12-26 LAB — CBC WITH DIFFERENTIAL/PLATELET
BASOS: 2 %
Basophils Absolute: 0.1 10*3/uL (ref 0.0–0.2)
EOS (ABSOLUTE): 0.2 10*3/uL (ref 0.0–0.4)
EOS: 6 %
HEMATOCRIT: 45.1 % (ref 37.5–51.0)
Hemoglobin: 14.8 g/dL (ref 13.0–17.7)
IMMATURE GRANS (ABS): 0 10*3/uL (ref 0.0–0.1)
Immature Granulocytes: 0 %
Lymphocytes Absolute: 0.9 10*3/uL (ref 0.7–3.1)
Lymphs: 22 %
MCH: 28.8 pg (ref 26.6–33.0)
MCHC: 32.8 g/dL (ref 31.5–35.7)
MCV: 88 fL (ref 79–97)
MONOS ABS: 0.5 10*3/uL (ref 0.1–0.9)
Monocytes: 12 %
NEUTROS ABS: 2.4 10*3/uL (ref 1.4–7.0)
Neutrophils: 58 %
PLATELETS: 179 10*3/uL (ref 150–379)
RBC: 5.13 x10E6/uL (ref 4.14–5.80)
RDW: 12.3 % (ref 12.3–15.4)
WBC: 4.2 10*3/uL (ref 3.4–10.8)

## 2016-12-26 LAB — COMPREHENSIVE METABOLIC PANEL
A/G RATIO: 1.4 (ref 1.2–2.2)
ALT: 21 IU/L (ref 0–44)
AST: 28 IU/L (ref 0–40)
Albumin: 4.3 g/dL (ref 3.5–5.5)
Alkaline Phosphatase: 38 IU/L — ABNORMAL LOW (ref 39–117)
BILIRUBIN TOTAL: 0.7 mg/dL (ref 0.0–1.2)
BUN / CREAT RATIO: 12 (ref 9–20)
BUN: 15 mg/dL (ref 6–24)
CALCIUM: 9.5 mg/dL (ref 8.7–10.2)
CHLORIDE: 105 mmol/L (ref 96–106)
CO2: 26 mmol/L (ref 20–29)
Creatinine, Ser: 1.29 mg/dL — ABNORMAL HIGH (ref 0.76–1.27)
GFR, EST AFRICAN AMERICAN: 79 mL/min/{1.73_m2} (ref >59)
GFR, EST NON AFRICAN AMERICAN: 68 mL/min/{1.73_m2} (ref >59)
GLOBULIN, TOTAL: 3.1 g/dL (ref 1.5–4.5)
Glucose: 95 mg/dL (ref 65–99)
POTASSIUM: 5.1 mmol/L (ref 3.5–5.2)
SODIUM: 143 mmol/L (ref 134–144)
TOTAL PROTEIN: 7.4 g/dL (ref 6.0–8.5)

## 2016-12-26 LAB — GC/CHLAMYDIA PROBE AMP
CHLAMYDIA, DNA PROBE: NEGATIVE
NEISSERIA GONORRHOEAE BY PCR: NEGATIVE

## 2016-12-26 LAB — HIV ANTIBODY (ROUTINE TESTING W REFLEX): HIV SCREEN 4TH GENERATION: NONREACTIVE

## 2016-12-26 LAB — LIPID PANEL
CHOL/HDL RATIO: 3.6 ratio (ref 0.0–5.0)
Cholesterol, Total: 160 mg/dL (ref 100–199)
HDL: 44 mg/dL (ref >39)
LDL Calculated: 92 mg/dL (ref 0–99)
Triglycerides: 121 mg/dL (ref 0–149)
VLDL Cholesterol Cal: 24 mg/dL (ref 5–40)

## 2016-12-26 LAB — PSA: Prostate Specific Ag, Serum: 1.2 ng/mL (ref 0.0–4.0)

## 2016-12-26 LAB — HEPATITIS PANEL, ACUTE
HEP A IGM: NEGATIVE
HEP B S AG: NEGATIVE
Hep B C IgM: NEGATIVE

## 2016-12-26 LAB — TSH: TSH: 1.85 u[IU]/mL (ref 0.450–4.500)

## 2016-12-26 LAB — RPR: RPR: NONREACTIVE

## 2016-12-31 ENCOUNTER — Ambulatory Visit (HOSPITAL_COMMUNITY)
Admission: RE | Admit: 2016-12-31 | Discharge: 2016-12-31 | Disposition: A | Discharge status: Home / Self Care | Payer: 59 | Source: Ambulatory Visit | Attending: Internal Medicine | Admitting: Internal Medicine

## 2016-12-31 DIAGNOSIS — I2510 Atherosclerotic heart disease of native coronary artery without angina pectoris: Secondary | ICD-10-CM

## 2016-12-31 DIAGNOSIS — I700 Atherosclerosis of aorta: Secondary | ICD-10-CM | POA: Diagnosis not present

## 2016-12-31 DIAGNOSIS — N62 Hypertrophy of breast: Secondary | ICD-10-CM | POA: Diagnosis not present

## 2016-12-31 MED ORDER — METOPROLOL TARTRATE 5 MG/5ML IV SOLN
INTRAVENOUS | Status: AC
  Filled 2016-12-31: qty 5

## 2016-12-31 MED ORDER — IOPAMIDOL (ISOVUE-370) INJECTION 76%
INTRAVENOUS | Status: AC
  Administered 2016-12-31: 80 mL via INTRAVENOUS
  Filled 2016-12-31: qty 100

## 2016-12-31 MED ORDER — METOPROLOL TARTRATE 5 MG/5ML IV SOLN
5.0000 mg | Freq: Once | INTRAVENOUS | Status: AC
  Administered 2016-12-31: 5 mg via INTRAVENOUS
  Filled 2016-12-31: qty 5

## 2016-12-31 MED ORDER — NITROGLYCERIN 0.4 MG SL SUBL
SUBLINGUAL_TABLET | SUBLINGUAL | Status: AC
  Filled 2016-12-31: qty 2

## 2016-12-31 MED ORDER — NITROGLYCERIN 0.4 MG SL SUBL
0.8000 mg | SUBLINGUAL_TABLET | Freq: Once | SUBLINGUAL | Status: AC
  Administered 2016-12-31: 0.8 mg via SUBLINGUAL
  Filled 2016-12-31: qty 25

## 2017-01-02 ENCOUNTER — Encounter: Payer: Self-pay | Admitting: Internal Medicine

## 2017-01-04 ENCOUNTER — Encounter: Payer: Self-pay | Admitting: Internal Medicine

## 2017-01-04 NOTE — Progress Notes (Unsigned)
I have spoken with Dr. Meda Coffee last week regarding his CT scan. It appears that there is a change at the insertion of the RCA with the aorta. 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 It is however a dominant right and her concern is that there is myocardium potentially ischemic. Because any procedure complicated, she recommended functional testing;   she was going to attempt computerized FFR in addition

## 2017-01-10 ENCOUNTER — Encounter: Payer: Self-pay | Admitting: General Surgery

## 2017-01-10 ENCOUNTER — Ambulatory Visit (INDEPENDENT_AMBULATORY_CARE_PROVIDER_SITE_OTHER): Payer: 59 | Admitting: General Surgery

## 2017-01-10 VITALS — BP 110/72 | HR 68 | Resp 14 | Ht 69.5 in | Wt 201.0 lb

## 2017-01-10 DIAGNOSIS — D171 Benign lipomatous neoplasm of skin and subcutaneous tissue of trunk: Secondary | ICD-10-CM | POA: Diagnosis not present

## 2017-01-10 NOTE — Progress Notes (Signed)
Patient ID: [name], male   DOB: 14-May-1976, 40 y.o.   MRN: [id number]  Chief Complaint  Patient presents with  . Mass    right buttock    HPI [name] is a 41 y.o. male.  Here today for evaluation of a right buttock mass referred by Dr Sanda Klein. He states the knot has been there for about 7-8 years. He states it does not hurt. He thinks it may have gotten larger gradually.  He states it is about the size of quarter.  He works in Engineer, technical sales for [employer].  HPI  Past Medical History:  Diagnosis Date  . Anxiety   . Aortic valve replaced    Requiring replacement, specifics not available  . Hearing loss of both ears 12/22/2015  . Heart murmur   . Hypertension   . IBD (inflammatory bowel disease) 09/23/2015  . ICD (implantable cardiac defibrillator), BSX single   . Obesity 12/22/2015  . Secondary cardiomyopathy ([place])   . Syncope   . Ventricular septal defect   . Ventricular tachycardia ([place])    appropriate VT shock therapy /13    Past Surgical History:  Procedure Laterality Date  . ANGIOPLASTY     RCA repair with vein angioplasty following injury with the aforementioned surgery  . AORTIC VALVE REPLACEMENT     Bioprosthesis  . CARDIAC DEFIBRILLATOR PLACEMENT    . CARDIAC VALVE REPLACEMENT    . CHOLECYSTECTOMY    . COLONOSCOPY WITH PROPOFOL N/A 12/06/2015   Procedure: COLONOSCOPY WITH PROPOFOL;  Surgeon: [name], MD;  Location: [facility] ENDOSCOPY;  Service: Endoscopy;  Laterality: N/A;  . CORONARY ARTERY BYPASS GRAFT    . IMPLANTABLE CARDIOVERTER DEFIBRILLATOR GENERATOR CHANGE N/A 05/05/2013   Procedure: IMPLANTABLE CARDIOVERTER DEFIBRILLATOR GENERATOR CHANGE;  Surgeon: Deboraha Sprang, MD;  Location: [facility] CATH LAB;  Service: Cardiovascular;  Laterality: N/A;  . INSERT / REPLACE / REMOVE PACEMAKER    . VSD REPAIR      Family History  Problem Relation Age of Onset  . Heart disease Father   . Hypertension Maternal Grandmother   . Cancer Maternal Grandmother   . Hypertension Maternal  Grandfather   . Cancer Maternal Grandfather     Social History Social History  Substance Use Topics  . Smoking status: Never Smoker  . Smokeless tobacco: Never Used  . Alcohol use No    Allergies  Allergen Reactions  . Shellfish Allergy Nausea And Vomiting    Current Outpatient Prescriptions  Medication Sig Dispense Refill  . ATROVENT HFA 17 MCG/ACT inhaler Inhale 2 puffs into the lungs 4 (four) times daily as needed.    . carvedilol (COREG) 12.5 MG tablet Take 1 tablet (12.5 mg total) by mouth 2 (two) times daily. 60 tablet 11  . fluticasone (FLONASE) 50 MCG/ACT nasal spray Place 2 sprays into both nostrils daily. (Patient taking differently: Place 2 sprays into both nostrils as needed. ) 16 g 2  . loratadine (CLARITIN) 5 MG chewable tablet Chew 5 mg by mouth daily as needed for allergies.    . magnesium oxide (MAG-OX) 400 (241.3 Mg) MG tablet Take 1 tablet (400 mg total) by mouth 2 (two) times daily. 60 tablet 0  . Multiple Vitamin (MULTIVITAMIN) tablet Take 1 tablet by mouth daily.    . ranolazine (RANEXA) 500 MG 12 hr tablet Take 1 tablet (500 mg total) by mouth 2 (two) times daily. 180 tablet 3  . sacubitril-valsartan (ENTRESTO) 24-26 MG Take 1 tablet by mouth 2 (two) times daily.  60 tablet 11  . sildenafil (REVATIO) 20 MG tablet Take 2-3 tablets (40-60 mg total) by mouth daily as needed. Never combine with nitrates 30 tablet 5   No current facility-administered medications for this visit.     Review of Systems Review of Systems  Constitutional: Negative.   Respiratory: Negative.   Cardiovascular: Negative.   Gastrointestinal: Negative for constipation and diarrhea.    Blood pressure 110/72, pulse 68, resp. rate 14, height 5' 9.5" (1.765 m), weight 201 lb (91.2 kg).  Physical Exam Physical Exam  Constitutional: He is oriented to person, place, and time. He appears well-developed and well-nourished.  HENT:  Mouth/Throat: Oropharynx is clear and moist.  Eyes:  Conjunctivae are normal. No scleral icterus.  Neck: Neck supple.  Genitourinary:     Lymphadenopathy:    He has no cervical adenopathy.  Neurological: He is alert and oriented to person, place, and time.  Skin: Skin is warm and dry.  Psychiatric: His behavior is normal.    Data Reviewed CBC dated 12/25/2016 showed a hemoglobin of 14.8 with an MCV of 88, white blood cell count 4200. Normal differential.  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    Plan    Excision under local anesthesia is reasonable based on its size and location. Risks of the procedure including those related to bleeding and infection reviewed.    Patient to return for excision right buttocks.   HPI, Physical Exam, Assessment and Plan have been scribed under the direction and in the presence of Robert Bellow, MD. Karie Fetch, RN  I have completed the exam and reviewed the above documentation for accuracy and completeness.  I agree with the above.  Haematologist has been used and any errors in dictation or transcription are unintentional.  Hervey Ard, M.D., F.A.C.S.  Robert Bellow 01/11/2017, 4:33 PM

## 2017-01-10 NOTE — Patient Instructions (Signed)
Return for excision right buttocks.

## 2017-01-11 DIAGNOSIS — D171 Benign lipomatous neoplasm of skin and subcutaneous tissue of trunk: Secondary | ICD-10-CM | POA: Insufficient documentation

## 2017-01-30 ENCOUNTER — Encounter: Payer: Self-pay | Admitting: Internal Medicine

## 2017-01-31 ENCOUNTER — Ambulatory Visit (INDEPENDENT_AMBULATORY_CARE_PROVIDER_SITE_OTHER): Payer: 59 | Admitting: General Surgery

## 2017-01-31 ENCOUNTER — Encounter: Payer: Self-pay | Admitting: General Surgery

## 2017-01-31 VITALS — BP 130/70 | HR 61 | Resp 14 | Ht 70.0 in | Wt 203.0 lb

## 2017-01-31 DIAGNOSIS — D171 Benign lipomatous neoplasm of skin and subcutaneous tissue of trunk: Secondary | ICD-10-CM | POA: Diagnosis not present

## 2017-01-31 NOTE — Progress Notes (Signed)
Patient ID: [name], male   DOB: [birth year], 41 y.o.   MRN: [id number]  Chief Complaint  Patient presents with  . Procedure    HPI [name] is a 41 y.o. male.  Here for excision right gluteal mass.  HPI  Past Medical History:  Diagnosis Date  . Anxiety   . Aortic valve replaced    Requiring replacement, specifics not available  . Hearing loss of both ears 12/22/2015  . Heart murmur   . Hypertension   . IBD (inflammatory bowel disease) 09/23/2015  . ICD (implantable cardiac defibrillator), BSX single   . Obesity 12/22/2015  . Secondary cardiomyopathy ([place])   . Syncope   . Ventricular septal defect   . Ventricular tachycardia ([place])    appropriate VT shock therapy /13    Past Surgical History:  Procedure Laterality Date  . ANGIOPLASTY     RCA repair with vein angioplasty following injury with the aforementioned surgery  . AORTIC VALVE REPLACEMENT     Bioprosthesis  . CARDIAC DEFIBRILLATOR PLACEMENT    . CARDIAC VALVE REPLACEMENT    . CHOLECYSTECTOMY    . COLONOSCOPY WITH PROPOFOL N/A 12/06/2015   Procedure: COLONOSCOPY WITH PROPOFOL;  Surgeon: [name], MD;  Location: [facility] ENDOSCOPY;  Service: Endoscopy;  Laterality: N/A;  . CORONARY ARTERY BYPASS GRAFT    . IMPLANTABLE CARDIOVERTER DEFIBRILLATOR GENERATOR CHANGE N/A 05/05/2013   Procedure: IMPLANTABLE CARDIOVERTER DEFIBRILLATOR GENERATOR CHANGE;  Surgeon: Deboraha Sprang, MD;  Location: [facility] CATH LAB;  Service: Cardiovascular;  Laterality: N/A;  . INSERT / REPLACE / REMOVE PACEMAKER    . VSD REPAIR      Family History  Problem Relation Age of Onset  . Heart disease Father   . Hypertension Maternal Grandmother   . Cancer Maternal Grandmother   . Hypertension Maternal Grandfather   . Cancer Maternal Grandfather     Social History Social History  Substance Use Topics  . Smoking status: Never Smoker  . Smokeless tobacco: Never Used  . Alcohol use No    Allergies  Allergen Reactions  . Shellfish Allergy  Nausea And Vomiting    Current Outpatient Prescriptions  Medication Sig Dispense Refill  . ATROVENT HFA 17 MCG/ACT inhaler Inhale 2 puffs into the lungs 4 (four) times daily as needed.    . carvedilol (COREG) 12.5 MG tablet Take 1 tablet (12.5 mg total) by mouth 2 (two) times daily. 60 tablet 11  . fluticasone (FLONASE) 50 MCG/ACT nasal spray Place 2 sprays into both nostrils daily. (Patient taking differently: Place 2 sprays into both nostrils as needed. ) 16 g 2  . loratadine (CLARITIN) 5 MG chewable tablet Chew 5 mg by mouth daily as needed for allergies.    . magnesium oxide (MAG-OX) 400 (241.3 Mg) MG tablet Take 1 tablet (400 mg total) by mouth 2 (two) times daily. 60 tablet 0  . Multiple Vitamin (MULTIVITAMIN) tablet Take 1 tablet by mouth daily.    . ranolazine (RANEXA) 500 MG 12 hr tablet Take 1 tablet (500 mg total) by mouth 2 (two) times daily. 180 tablet 3  . sacubitril-valsartan (ENTRESTO) 24-26 MG Take 1 tablet by mouth 2 (two) times daily. 60 tablet 11  . sildenafil (REVATIO) 20 MG tablet Take 2-3 tablets (40-60 mg total) by mouth daily as needed. Never combine with nitrates 30 tablet 5   No current facility-administered medications for this visit.     Review of Systems Review of Systems  Constitutional: Negative.   Respiratory: Negative.  Cardiovascular: Negative.     Blood pressure 130/70, pulse 61, resp. rate 14, height 5\' 10"  (1.778 m), weight 203 lb (92.1 kg).  Physical Exam Physical Exam  Constitutional: He is oriented to person, place, and time. He appears well-developed and well-nourished.  Genitourinary:     Neurological: He is alert and oriented to person, place, and time.  Skin: Skin is warm and dry.  Psychiatric: His behavior is normal.    Data Reviewed The patient was placed in the prone position and the proximal tape to part. ChloraPrep was applied to the skin. 20 mL of 0.5% Xylocaine with 0.25% Marcaine with 1-200,000 units of epinephrine was  utilized well tolerated. Repeat skin cleansing with ChloraPrep was undertaken. A radial incision was made over the mass which was a superficially located lipoma extending with a pod of adipose tissue down towards the scrotum. This was excised in its entirety. No bleeding noted. Deep tissue was approximated with interrupted 3-0 Vicryl figure-of-eight sutures. Skin closed with a running 3-0 Vicryl subcuticular suture. Benzoin, Steri-Strips, Telfa and Tegaderm dressing applied. Ice pack applied.  Postoperative wound care reviewed.  Assessment    Perineal lipoma.    Plan    Patient was encouraged to make use of ice intermittently for the next 24-48 hours. Tylenol/Advil/Aleve: If needed for soreness. He may shower any time. Dressing does not need to be replaced once its dislodged.    Nurse visit in one week.  HPI, Physical Exam, Assessment and Plan have been scribed under the direction and in the presence of Robert Bellow, MD. Karie Fetch, RN  I have completed the exam and reviewed the above documentation for accuracy and completeness.  I agree with the above.  Haematologist has been used and any errors in dictation or transcription are unintentional.  Hervey Ard, M.D., F.A.C.S.  Robert Bellow 02/01/2017, 7:33 AM

## 2017-01-31 NOTE — Patient Instructions (Signed)
The patient is aware to call back for any questions or concerns.  

## 2017-02-01 ENCOUNTER — Encounter: Payer: Self-pay | Admitting: Nurse Practitioner

## 2017-02-04 DIAGNOSIS — D171 Benign lipomatous neoplasm of skin and subcutaneous tissue of trunk: Secondary | ICD-10-CM | POA: Diagnosis not present

## 2017-02-05 ENCOUNTER — Encounter: Payer: Self-pay | Admitting: Internal Medicine

## 2017-02-05 ENCOUNTER — Telehealth: Payer: Self-pay | Admitting: Internal Medicine

## 2017-02-05 NOTE — Progress Notes (Unsigned)
I spoke with [name] last pm about CTA  The result showed akinesis/aneurysm dilitation of the inferior wall and severe concentric LVH in setting of AV replacement Ca score was 0 Critical stenosis of the RCA--attachment. FFR demonstrated no flow in the RCA but no obstruction elsewhere  Reviewed echoes available in our system.  Apart from the TEE, which I suspect resulted in foreshortening and artifactually high readings, all have shown inferior wall akinesis/aneurysmal disease at the site of the presumed perioperative MI   His VT has been monomorphic and polymorphic  The former may be amenable to ablation, the latter not likely, and so would continue medical therapy for his cardiomyopathy inclu carvedilol and entresto.  If BP tolerates aldactone would be reasonable to consider  He may desire a second opinion regarding management of his VT;  I support that desire   He will let us know where

## 2017-02-05 NOTE — Telephone Encounter (Signed)
Per Dr. Caryl Comes, he called and spoke with the patient last night and discussed his Cardiac CT with him. He would like to come in and discuss further with Dr. Caryl Comes, but may also want to go for a 2nd opinion regarding his VT.   Will forward a note to scheduling to please call and arrange follow up with Dr. Caryl Comes.

## 2017-02-07 ENCOUNTER — Ambulatory Visit (INDEPENDENT_AMBULATORY_CARE_PROVIDER_SITE_OTHER): Payer: 59 | Admitting: *Deleted

## 2017-02-07 DIAGNOSIS — D171 Benign lipomatous neoplasm of skin and subcutaneous tissue of trunk: Secondary | ICD-10-CM

## 2017-02-07 LAB — PATHOLOGY

## 2017-02-07 NOTE — Patient Instructions (Signed)
The patient is aware to call back for any questions or concerns.  

## 2017-02-07 NOTE — Progress Notes (Signed)
Patient ID: [name], male   DOB: 1976-03-24, 41 y.o.   MRN: [id number]   Patient came in today for a wound check.  The wound is clean, with no signs of infection noted. Follow up as scheduled. Aware of pathology The patient is aware to call back for any questions or concerns.

## 2017-02-12 ENCOUNTER — Ambulatory Visit (INDEPENDENT_AMBULATORY_CARE_PROVIDER_SITE_OTHER): Payer: 59 | Admitting: Internal Medicine

## 2017-02-12 ENCOUNTER — Encounter: Payer: Self-pay | Admitting: Internal Medicine

## 2017-02-12 VITALS — BP 110/80 | HR 53 | Ht 70.0 in | Wt 206.5 lb

## 2017-02-12 DIAGNOSIS — I472 Ventricular tachycardia, unspecified: Secondary | ICD-10-CM

## 2017-02-12 DIAGNOSIS — I359 Nonrheumatic aortic valve disorder, unspecified: Secondary | ICD-10-CM

## 2017-02-12 DIAGNOSIS — I429 Cardiomyopathy, unspecified: Secondary | ICD-10-CM | POA: Diagnosis not present

## 2017-02-12 DIAGNOSIS — Z95810 Presence of automatic (implantable) cardiac defibrillator: Secondary | ICD-10-CM

## 2017-02-12 DIAGNOSIS — I493 Ventricular premature depolarization: Secondary | ICD-10-CM

## 2017-02-12 MED ORDER — SPIRONOLACTONE 25 MG PO TABS: ORAL_TABLET | ORAL | 6 refills | Status: DC

## 2017-02-12 MED ORDER — SACUBITRIL-VALSARTAN 24-26 MG PO TABS: 1.0000 | ORAL_TABLET | Freq: Two times a day (BID) | ORAL | 0 refills | Status: DC

## 2017-02-12 MED ORDER — SACUBITRIL-VALSARTAN 24-26 MG PO TABS: 1.0000 | ORAL_TABLET | Freq: Two times a day (BID) | ORAL | 11 refills | Status: DC

## 2017-02-12 NOTE — Patient Instructions (Addendum)
Medication Instructions: - Your physician has recommended you make the following change in your medication:  1) START aldactone (spironolactone) 25 mg- take 1/2 tablet (12.5 mg) by mouth once daily  Labwork: - Your physician recommends that you return for lab work in: 2 weeks- BMP  Procedures/Testing: - none ordered  Follow-Up: - Remote monitoring is used to monitor your Pacemaker of ICD from home. This monitoring reduces the number of office visits required to check your device to one time per year. It allows [place] to keep an eye on the functioning of your device to ensure it is working properly. You are scheduled for a device check from home on 05/15/17. You may send your transmission at any time that day. If you have a wireless device, the transmission will be sent automatically. After your physician reviews your transmission, you will receive a postcard with your next transmission date.  - Your physician wants you to follow-up in: 6 months with Dr. Caryl Comes. You will receive a reminder letter in the mail two months in advance. If you don't receive a letter, please call our office to schedule the follow-up appointment.   Any Additional Special Instructions Will Be Listed Below (If Applicable).     If you need a refill on your cardiac medications before your next appointment, please call your pharmacy.

## 2017-02-12 NOTE — Progress Notes (Signed)
Patient Care Team: Lada, Satira Anis, MD as PCP - General (Family Medicine) Carloyn Manner, MD as Referring Physician (Otolaryngology) Bary Castilla Forest Gleason, MD (General Surgery) Sanda Overton Boggus Satira Anis, MD as Attending Physician (Family Medicine)   HPI  [name] is a 41 y.o. male Seen in followup for ventricular tachycardia occurring in the context of surgically corrected congenital heart disease With Aortic valve replacement.  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  My note from 2011 reports that at the time of surgery "he had damage to his right coronary artery and underwent repair." He is status post ICD implantation. 4/13 had appropriate shock for fast ventricular tachycardia He underwent device generator replacement 12/14    Echo March 2013 demonstrated left ventricular dysfunction with an ejection fraction of 40-45% with inferior posterior akinesis and aortic valve bioprosthesis. 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  Echo 12/14 demonstrated mild worsening of LV function 35-40%.with questions regarding aortic valve and possible stenosis with a bioprosthesis;  TEE 1/15 was undertaken by Dr. Deidre Ala demonstrated that normal LV function no significant aortic stenosis with well visualized aortic valve opening.  ( suspect incorrect 2/2 view axis)   DATE TEST    3/13    Echo   EF 40-45 % IP akinesis   1/15    TEE   EF 60 % "mechanical aortic valve"  2/17 Echo 45-50%   12/17 Echo EF 35-40 "bioprosthesis "  5/17 Echo EF 30-35%   8/18 CT EF 30-35 RCA Total without flow//aneurysm Inferior Wall    12/17  Admitted [facility] for syncopal VT- PM  Antecedent VT PM in wake of viral illness  Ranexa and MAG added  He has had interval VTNS monomorphic and polymorphic    Antiarrhythmics Date Reason stopped  Ranolazine 12/17     Underwent CT  8/18 exploring change in EF and causes of cardiomyopathy  We had started him also  on entresto   He is tolerating well without lightheadedness  He has multiple questions re hx and prognosis related to vardiomyopathy aneurysm and VT  Now without chest pain SOB or edema No palps   DATE TEST    6/18    HOLTER <1 % PVCs           Past Medical History:  Diagnosis Date  . Anxiety   . Aortic valve replaced    Requiring replacement, specifics not available  . Hearing loss of both ears 12/22/2015  . Heart murmur   . Hypertension   . IBD (inflammatory bowel disease) 09/23/2015  . ICD (implantable cardiac defibrillator), BSX single   . Obesity 12/22/2015  . Secondary cardiomyopathy ([place])   . Syncope   . Ventricular septal defect   . Ventricular tachycardia ([place])    appropriate VT shock therapy /13    Past Surgical History:  Procedure Laterality Date  . ANGIOPLASTY     RCA repair with vein angioplasty following injury with the aforementioned surgery  . AORTIC VALVE REPLACEMENT     Bioprosthesis  . CARDIAC DEFIBRILLATOR PLACEMENT    . CARDIAC VALVE REPLACEMENT    . CHOLECYSTECTOMY    . COLONOSCOPY WITH PROPOFOL N/A 12/06/2015   Procedure: COLONOSCOPY WITH PROPOFOL;  Surgeon: [name], MD;  Location: [facility] ENDOSCOPY;  Service: Endoscopy;  Laterality: N/A;  .  CORONARY ARTERY BYPASS GRAFT    . IMPLANTABLE CARDIOVERTER DEFIBRILLATOR GENERATOR CHANGE N/A 05/05/2013   Procedure: IMPLANTABLE CARDIOVERTER DEFIBRILLATOR GENERATOR CHANGE;  Surgeon: Deboraha Sprang, MD;  Location: [facility] CATH LAB;  Service: Cardiovascular;  Laterality: N/A;  . INSERT / REPLACE / REMOVE PACEMAKER    . VSD REPAIR      Current Outpatient Prescriptions  Medication Sig Dispense Refill  . ATROVENT HFA 17 MCG/ACT inhaler Inhale 2 puffs into the lungs 4 (four) times daily as needed.    . carvedilol (COREG) 12.5 MG tablet Take 1 tablet (12.5 mg  total) by mouth 2 (two) times daily. 60 tablet 11  . fluticasone (FLONASE) 50 MCG/ACT nasal spray Place 2 sprays into both nostrils daily. (Patient taking differently: Place 2 sprays into both nostrils as needed. ) 16 g 2  . loratadine (CLARITIN) 5 MG chewable tablet Chew 5 mg by mouth daily as needed for allergies.    . magnesium oxide (MAG-OX) 400 (241.3 Mg) MG tablet Take 1 tablet (400 mg total) by mouth 2 (two) times daily. 60 tablet 0  . Multiple Vitamin (MULTIVITAMIN) tablet Take 1 tablet by mouth daily.    . ranolazine (RANEXA) 500 MG 12 hr tablet Take 1 tablet (500 mg total) by mouth 2 (two) times daily. 180 tablet 3  . sacubitril-valsartan (ENTRESTO) 24-26 MG Take 1 tablet by mouth 2 (two) times daily. 60 tablet 11  . sildenafil (REVATIO) 20 MG tablet Take 2-3 tablets (40-60 mg total) by mouth daily as needed. Never combine with nitrates 30 tablet 5   No current facility-administered medications for this visit.     Allergies  Allergen Reactions  . Shellfish Allergy Nausea And Vomiting    Review of Systems negative except from HPI and PMH  Physical Exam BP 110/80 (BP Location: Left Arm, Patient Position: Sitting, Cuff Size: Normal)   Pulse (!) 53   Ht 5\' 10"  (1.778 m)   Wt 206 lb 8 oz (93.7 kg)   BMI 29.63 kg/m  Well developed and nourished in no acute distress HENT normal Neck supple with JVP-flat Clear Regular rate and rhythm, no murmurs or gallops Abd-soft with active BS No Clubbing cyanosis edema Skin-warm and dry A & Oriented  Grossly normal sensory and motor function     ECG personally reviewed  Sinus at 57 15/12/46 PVCs Assessment and plan  Ventricular tachycardia   Hypertension   Aortic valve-bioprosthesis   ICD-[employer]  The patient's device was interrogated.  The information was reviewed. No changes were made in the programming.      Cardiomyopathy-secondary  PVCs-infrequent   No intercurrent Ventricular tachycardia requiring  therapy  Discussed the Guideline directed medical therapy for cardiomyopathy  Will add aldactone 12.5   Side effects discussed And will check BMET in 2 weeks  Have sent referral letter to [facility] on his behalf  BP well controlled  More than 50% of 40 min was spent in counseling related to the above

## 2017-02-15 LAB — CUP PACEART INCLINIC DEVICE CHECK
Brady Statistic RV Percent Paced: 1 % — CL
HIGH POWER IMPEDANCE MEASURED VALUE: 42 Ohm
HighPow Impedance: 53 Ohm
Implantable Lead Implant Date: 20080214
Implantable Lead Model: 185
Implantable Pulse Generator Implant Date: 20141216
Lead Channel Pacing Threshold Amplitude: 1.2 V
Lead Channel Pacing Threshold Pulse Width: 0.4 ms
Lead Channel Sensing Intrinsic Amplitude: 18.4 mV
MDC IDC LEAD LOCATION: 753860
MDC IDC LEAD SERIAL: 178017
MDC IDC MSMT LEADCHNL RV IMPEDANCE VALUE: 597 Ohm
MDC IDC PG SERIAL: 126781
MDC IDC SESS DTM: 20180925040000
MDC IDC SET LEADCHNL RV PACING AMPLITUDE: 2.4 V
MDC IDC SET LEADCHNL RV PACING PULSEWIDTH: 0.4 ms
MDC IDC SET LEADCHNL RV SENSING SENSITIVITY: 0.6 mV

## 2017-02-26 ENCOUNTER — Other Ambulatory Visit (INDEPENDENT_AMBULATORY_CARE_PROVIDER_SITE_OTHER): Payer: 59

## 2017-02-26 DIAGNOSIS — I472 Ventricular tachycardia, unspecified: Secondary | ICD-10-CM

## 2017-02-26 DIAGNOSIS — I493 Ventricular premature depolarization: Secondary | ICD-10-CM | POA: Diagnosis not present

## 2017-02-27 LAB — BASIC METABOLIC PANEL
BUN/Creatinine Ratio: 11 (ref 9–20)
BUN: 13 mg/dL (ref 6–24)
CALCIUM: 9.5 mg/dL (ref 8.7–10.2)
CO2: 24 mmol/L (ref 20–29)
CREATININE: 1.18 mg/dL (ref 0.76–1.27)
Chloride: 104 mmol/L (ref 96–106)
GFR calc Af Amer: 87 mL/min/{1.73_m2} (ref >59)
GFR, EST NON AFRICAN AMERICAN: 76 mL/min/{1.73_m2} (ref >59)
GLUCOSE: 92 mg/dL (ref 65–99)
Potassium: 4.6 mmol/L (ref 3.5–5.2)
Sodium: 143 mmol/L (ref 134–144)

## 2017-03-04 ENCOUNTER — Telehealth: Payer: Self-pay

## 2017-03-04 NOTE — Telephone Encounter (Signed)
Pt is aware and agreeable to normal results. He informed me he had already reviewed results on MyChart but was grateful for the call.

## 2017-03-20 ENCOUNTER — Other Ambulatory Visit: Payer: Self-pay

## 2017-03-20 ENCOUNTER — Other Ambulatory Visit: Payer: Self-pay | Admitting: *Deleted

## 2017-03-20 MED ORDER — SACUBITRIL-VALSARTAN 24-26 MG PO TABS: 1.0000 | ORAL_TABLET | Freq: Two times a day (BID) | ORAL | 11 refills | Status: DC

## 2017-04-10 ENCOUNTER — Encounter: Payer: Self-pay | Admitting: Family Medicine

## 2017-04-13 ENCOUNTER — Encounter: Payer: Self-pay | Admitting: Internal Medicine

## 2017-04-13 DIAGNOSIS — R05 Cough: Secondary | ICD-10-CM | POA: Diagnosis not present

## 2017-04-26 DIAGNOSIS — H16252 Phlyctenular keratoconjunctivitis, left eye: Secondary | ICD-10-CM | POA: Diagnosis not present

## 2017-04-26 DIAGNOSIS — H11442 Conjunctival cysts, left eye: Secondary | ICD-10-CM | POA: Diagnosis not present

## 2017-05-09 ENCOUNTER — Ambulatory Visit: Payer: 59 | Admitting: Family Medicine

## 2017-05-09 ENCOUNTER — Encounter: Payer: Self-pay | Admitting: Family Medicine

## 2017-05-09 VITALS — BP 118/72 | HR 74 | Temp 98.2°F | Resp 14 | Wt 213.1 lb

## 2017-05-09 DIAGNOSIS — R110 Nausea: Secondary | ICD-10-CM | POA: Diagnosis not present

## 2017-05-09 DIAGNOSIS — K625 Hemorrhage of anus and rectum: Secondary | ICD-10-CM

## 2017-05-09 DIAGNOSIS — R1013 Epigastric pain: Secondary | ICD-10-CM | POA: Insufficient documentation

## 2017-05-09 DIAGNOSIS — R140 Abdominal distension (gaseous): Secondary | ICD-10-CM

## 2017-05-09 DIAGNOSIS — R350 Frequency of micturition: Secondary | ICD-10-CM | POA: Diagnosis not present

## 2017-05-09 MED ORDER — PROMETHAZINE HCL 25 MG PO TABS: 12.5000 mg | ORAL_TABLET | Freq: Four times a day (QID) | ORAL | 0 refills | Status: DC | PRN

## 2017-05-09 NOTE — Progress Notes (Signed)
BP 118/72   Pulse 74   Temp 98.2 F (36.8 C) (Oral)   Resp 14   Wt 213 lb 1.6 oz (96.7 kg)   SpO2 98%   BMI 30.58 kg/m    Subjective:    Patient ID: [name], male    DOB: 12-24-1975, 41 y.o.   MRN: [id number]  HPI: [name] is a 41 y.o. male  Chief Complaint  Patient presents with  . Nausea    with bloating on and off for 3 weeks    HPI Patient is here for an acute visit Lots of abdominal bloating 3 weeks Nausea; no vomiting but has the urge to vomit; mostly after eating Some acid reflux Discomfort is epigastric Not much cramping No gallbladder No hx of pancreatitis Ice cream and whipped cream and cheese Not much diarrhea; no constipation; pretty regular Some blood in the stools; just hemorrhoids external; bright red blood I asked if he has seen someone for that; he did see a specialist and they injected them and that kept them controlled for a while; colonoscopy about a year ago No travel except for travel to [place] in the summer No sick pets Water source is bottled water No raw seafood No fever Has not tried any medicines No fam hx of celiac disease or inflammatory bowel disease   ------------------------------------------------------------------------- CT scan done May 2017: IMPRESSION: There is inflammatory change and thickening of the wall of the colon in a continuous fashion from the ascending colon to the rectum. Differential diagnosis includes pseudomembranous colitis, inflammatory bowel disease, infectious enteritis. Malignancy and ischemia are less likely given the distribution. There is no evidence of extraluminal bowel gas or abscess formation.   Electronically Signed   By: Marybelle Killings M.D.   On: 09/23/2015 10:39 ---------------------------------------------------------------------------  Frequent urination, especially at night Taking the water pill in the mornings No dry mouth No snoring or sleep apnea, just restlessness  at night No strong odor or smell; no burning with urination; no hematuria  Depression screen [facility] 2/9 05/09/2017 12/25/2016 11/28/2016 05/29/2016 12/22/2015  Decreased Interest 0 0 0 0 0  Down, Depressed, Hopeless 0 0 0 0 0  PHQ - 2 Score 0 0 0 0 0    Relevant past medical, surgical, family and social history reviewed Past Medical History:  Diagnosis Date  . Anxiety   . Aortic valve replaced    Requiring replacement, specifics not available  . Hearing loss of both ears 12/22/2015  . Heart murmur   . Hypertension   . IBD (inflammatory bowel disease) 09/23/2015  . ICD (implantable cardiac defibrillator), BSX single   . Obesity 12/22/2015  . Secondary cardiomyopathy ([place])   . Syncope   . Ventricular septal defect   . Ventricular tachycardia ([place])    appropriate VT shock therapy /13   Past Surgical History:  Procedure Laterality Date  . ANGIOPLASTY     RCA repair with vein angioplasty following injury with the aforementioned surgery  . AORTIC VALVE REPLACEMENT     Bioprosthesis  . CARDIAC DEFIBRILLATOR PLACEMENT    . CARDIAC VALVE REPLACEMENT    . CHOLECYSTECTOMY    . COLONOSCOPY WITH PROPOFOL N/A 12/06/2015   Procedure: COLONOSCOPY WITH PROPOFOL;  Surgeon: [name], MD;  Location: [facility] ENDOSCOPY;  Service: Endoscopy;  Laterality: N/A;  . CORONARY ARTERY BYPASS GRAFT    . IMPLANTABLE CARDIOVERTER DEFIBRILLATOR GENERATOR CHANGE N/A 05/05/2013   Procedure: IMPLANTABLE CARDIOVERTER DEFIBRILLATOR GENERATOR CHANGE;  Surgeon: Deboraha Sprang, MD;  Location: [place] CATH LAB;  Service: Cardiovascular;  Laterality: N/A;  . INSERT / REPLACE / REMOVE PACEMAKER    . VSD REPAIR     Family History  Problem Relation Age of Onset  . Heart disease Father   . Hypertension Maternal Grandmother   . Cancer Maternal Grandmother   . Hypertension Maternal Grandfather   . Cancer Maternal Grandfather    Social History   Tobacco Use  . Smoking status: Never Smoker  . Smokeless tobacco: Never Used  Substance  Use Topics  . Alcohol use: No  . Drug use: No    Interim medical history since last visit reviewed. Allergies and medications reviewed  Review of Systems Per HPI unless specifically indicated above     Objective:    BP 118/72   Pulse 74   Temp 98.2 F (36.8 C) (Oral)   Resp 14   Wt 213 lb 1.6 oz (96.7 kg)   SpO2 98%   BMI 30.58 kg/m   Wt Readings from Last 3 Encounters:  05/09/17 213 lb 1.6 oz (96.7 kg)  02/12/17 206 lb 8 oz (93.7 kg)  01/31/17 203 lb (92.1 kg)    Physical Exam  Constitutional: He appears well-developed and well-nourished. No distress.  HENT:  Head: Normocephalic and atraumatic.  Eyes: No scleral icterus.  Cardiovascular: Normal rate and regular rhythm.  Pulmonary/Chest: Effort normal and breath sounds normal.  Abdominal: He exhibits distension. There is tenderness (mild epigastric).  Neurological: He is alert.  Skin: No pallor.  Psychiatric: He has a normal mood and affect.   Results for orders placed or performed in visit on 09/38/18  Basic metabolic panel  Result Value Ref Range   Glucose 92 65 - 99 mg/dL   BUN 13 6 - 24 mg/dL   Creatinine, Ser 1.18 0.76 - 1.27 mg/dL   GFR calc non Af Amer 76 >59 mL/min/1.73   GFR calc Af Amer 87 >59 mL/min/1.73   BUN/Creatinine Ratio 11 9 - 20   Sodium 143 134 - 144 mmol/L   Potassium 4.6 3.5 - 5.2 mmol/L   Chloride 104 96 - 106 mmol/L   CO2 24 20 - 29 mmol/L   Calcium 9.5 8.7 - 10.2 mg/dL      Assessment & Plan:   Problem List Items Addressed This Visit      Other   Epigastric pain - Primary    Start with labs and scan and referral      Relevant Orders   Celiac Disease Comprehensive Panel w Reflexes, Infant   Comprehensive metabolic panel   Lipase   CBC with Differential/Platelet   Amylase   Inflammatory Bowel Disease-IBD   Urinalysis, Routine w reflex microscopic   Ambulatory referral to Gastroenterology    Other Visit Diagnoses    Nausea       labs, phenergan; diet adjustment    Relevant Orders   Ambulatory referral to Gastroenterology   Frequent urination       Relevant Orders   Hemoglobin A1c   Urinalysis, Routine w reflex microscopic   Abdominal bloating       avoid all dairy; check labs to include celiac panel   Relevant Orders   Ambulatory referral to Gastroenterology   Bright red blood per rectum       refer back to GI; he had hemorrhoid injection years ago, may need treatment again; checking CBC   Relevant Orders   Ambulatory referral to Gastroenterology       Follow up plan:  No Follow-up on file.  An after-visit summary was printed and given to the patient at [place].  Please see the patient instructions which may contain other information and recommendations beyond what is mentioned above in the assessment and plan.  Meds ordered this encounter  Medications  . promethazine (PHENERGAN) 25 MG tablet    Sig: Take 0.5-1 tablets (12.5-25 mg total) by mouth every 6 (six) hours as needed for nausea or vomiting.    Dispense:  20 tablet    Refill:  0    Orders Placed This Encounter  Procedures  . Celiac Disease Comprehensive Panel w Reflexes, Infant  . Comprehensive metabolic panel  . Lipase  . Hemoglobin A1c  . CBC with Differential/Platelet  . Amylase  . Inflammatory Bowel Disease-IBD  . Urinalysis, Routine w reflex microscopic  . Ambulatory referral to Gastroenterology

## 2017-05-09 NOTE — Patient Instructions (Signed)
Avoid all dairy for now Almond or soy milk or lactaid milk We'll get labs and a scan to start with We'll refer to gastroenterologist

## 2017-05-09 NOTE — Assessment & Plan Note (Signed)
Start with labs and scan and referral

## 2017-05-10 ENCOUNTER — Ambulatory Visit: Payer: 59 | Admitting: Family Medicine

## 2017-05-11 LAB — CBC WITH DIFFERENTIAL/PLATELET
BASOS ABS: 0.1 10*3/uL (ref 0.0–0.2)
Basos: 1 %
EOS (ABSOLUTE): 0.2 10*3/uL (ref 0.0–0.4)
EOS: 5 %
HEMATOCRIT: 44.2 % (ref 37.5–51.0)
HEMOGLOBIN: 14.9 g/dL (ref 13.0–17.7)
IMMATURE GRANS (ABS): 0 10*3/uL (ref 0.0–0.1)
Immature Granulocytes: 0 %
LYMPHS: 25 %
Lymphocytes Absolute: 1.1 10*3/uL (ref 0.7–3.1)
MCH: 29.8 pg (ref 26.6–33.0)
MCHC: 33.7 g/dL (ref 31.5–35.7)
MCV: 88 fL (ref 79–97)
MONOCYTES: 15 %
Monocytes Absolute: 0.7 10*3/uL (ref 0.1–0.9)
Neutrophils Absolute: 2.4 10*3/uL (ref 1.4–7.0)
Neutrophils: 54 %
Platelets: 167 10*3/uL (ref 150–379)
RBC: 5 x10E6/uL (ref 4.14–5.80)
RDW: 12.6 % (ref 12.3–15.4)
WBC: 4.4 10*3/uL (ref 3.4–10.8)

## 2017-05-11 LAB — COMPREHENSIVE METABOLIC PANEL
A/G RATIO: 1.4 (ref 1.2–2.2)
ALBUMIN: 4.5 g/dL (ref 3.5–5.5)
ALT: 38 IU/L (ref 0–44)
AST: 31 IU/L (ref 0–40)
Alkaline Phosphatase: 34 IU/L — ABNORMAL LOW (ref 39–117)
BILIRUBIN TOTAL: 0.8 mg/dL (ref 0.0–1.2)
BUN / CREAT RATIO: 9 (ref 9–20)
BUN: 11 mg/dL (ref 6–24)
CALCIUM: 9.6 mg/dL (ref 8.7–10.2)
CHLORIDE: 102 mmol/L (ref 96–106)
CO2: 26 mmol/L (ref 20–29)
Creatinine, Ser: 1.24 mg/dL (ref 0.76–1.27)
GFR, EST AFRICAN AMERICAN: 82 mL/min/{1.73_m2} (ref >59)
GFR, EST NON AFRICAN AMERICAN: 71 mL/min/{1.73_m2} (ref >59)
Globulin, Total: 3.2 g/dL (ref 1.5–4.5)
Glucose: 99 mg/dL (ref 65–99)
POTASSIUM: 4.3 mmol/L (ref 3.5–5.2)
Sodium: 142 mmol/L (ref 134–144)
TOTAL PROTEIN: 7.7 g/dL (ref 6.0–8.5)

## 2017-05-11 LAB — CELIAC DISEASE COMPREHENSIVE PANEL W REFLEXES, INFANT
Antigliadin Abs, IgA: 3 units (ref 0–19)
IGA/IMMUNOGLOBULIN A, SERUM: 105 mg/dL (ref 90–386)
Transglutaminase IgA: <2 U/mL (ref 0–3)

## 2017-05-11 LAB — INFLAMMATORY BOWEL DISEASE-IBD

## 2017-05-11 LAB — URINALYSIS, ROUTINE W REFLEX MICROSCOPIC
BILIRUBIN UA: NEGATIVE
GLUCOSE, UA: NEGATIVE
KETONES UA: NEGATIVE
LEUKOCYTES UA: NEGATIVE
Nitrite, UA: NEGATIVE
PROTEIN UA: NEGATIVE
RBC, UA: NEGATIVE
SPEC GRAV UA: 1.018 (ref 1.005–1.030)
Urobilinogen, Ur: 0.2 mg/dL (ref 0.2–1.0)
pH, UA: 5.5 (ref 5.0–7.5)

## 2017-05-11 LAB — HEMOGLOBIN A1C
ESTIMATED AVERAGE GLUCOSE: 88 mg/dL
Hgb A1c MFr Bld: 4.7 % — ABNORMAL LOW (ref 4.8–5.6)

## 2017-05-11 LAB — LIPASE: Lipase: 48 U/L (ref 13–78)

## 2017-05-11 LAB — AMYLASE: AMYLASE: 84 U/L (ref 31–124)

## 2017-05-15 ENCOUNTER — Ambulatory Visit (INDEPENDENT_AMBULATORY_CARE_PROVIDER_SITE_OTHER): Payer: 59 | Admitting: *Deleted

## 2017-05-15 ENCOUNTER — Ambulatory Visit (INDEPENDENT_AMBULATORY_CARE_PROVIDER_SITE_OTHER): Payer: 59 | Admitting: Gastroenterology

## 2017-05-15 ENCOUNTER — Encounter: Payer: Self-pay | Admitting: Gastroenterology

## 2017-05-15 VITALS — BP 110/74 | HR 65 | Temp 97.9°F | Ht 70.0 in | Wt 211.6 lb

## 2017-05-15 DIAGNOSIS — R1013 Epigastric pain: Secondary | ICD-10-CM

## 2017-05-15 DIAGNOSIS — I472 Ventricular tachycardia, unspecified: Secondary | ICD-10-CM

## 2017-05-15 DIAGNOSIS — R140 Abdominal distension (gaseous): Secondary | ICD-10-CM | POA: Diagnosis not present

## 2017-05-15 NOTE — Progress Notes (Signed)
Fernando Bellows MD, MRCP(U.K) [place]  [place], [place] [address]  Main: [phone]  Fax: [phone]   Gastroenterology Consultation  Referring Provider:     Arnetha Courser, MD Primary Care Physician:  Arnetha Courser, MD Primary Gastroenterologist:  Dr. Jonathon Bates  Reason for Consultation:   Abdominal pain and nausea        HPI:   [name] is a 41 y.o. y/o male referred for consultation & management  by Dr. Sanda Bates, Satira Anis, MD has been referred for abdominal pain and nausea.   He has a history of surgically corrected congenital heart disease aortic valve replacement.  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  Has had an ICD implanted in the past.  History of ventricular tachycardia.  She had VSD repair.  LV systolic dysfunction.  He was recently seen by Dr. Sanda Bates on 05/09/2017 with a history of 3 weeks of abdominal bloating.  Epigastric discomfort.  CT scan of the abdomen in May 2017 showed inflammatory changes and thickening of the colon from descending colon to rectum.  He had a colonoscopy with Dr Allen Norris in  12/08/2015 no polyps were found biopsies of the terminal ileum and random colon were negative for inflammation and microscopic colitis.  Celiac serology has been negative to.  Recent labs on 05/09/2017 demonstrated normal CMP, lipase, HbA1c, CBC, amylase, urine analysis.   Abdominal pain: Onset: On and off since 3-4 weeks, prior to which had none. Usually after he eats. Usually within an hour meals. He has had it in the past as well. 2017 . Discomfort in the upper part of the abdomen , sharp in nature, more on the right side. Each episode can last no more than 5 minutes . The pain is similar to his gall bladder pain but less intense, same location  Severity :" 10/10 Nature of pain: sharp in nature. Gets 1 episode a day - infrequent, less attacks since he spoke with DR [name]  Aggravating factors: no idea    Relieving factors :water  Weight loss: gained some weight last few months  NSAID use: no  PPI use :was on prilosec sometime back but stopped taking it due to "issues associated with the medication"  Gall bladder surgery:  Taken out  Frequency of bowel movements: regular - every day or other day - soft and hard Change in bowel movements: no  Relief with bowel movements: no  Gas/Bloating/Abdominal distension: lot of bloating , more pain when he has more bloating . When he passes gas or burps feels better.  No chewing gum , no crystallite, occasional soda.   Past Medical History:  Diagnosis Date  . Anxiety   . Aortic valve replaced    Requiring replacement, specifics not available  . Hearing loss of both ears 12/22/2015  . Heart murmur   . Hypertension   . IBD (inflammatory bowel disease) 09/23/2015  . ICD (implantable cardiac defibrillator), BSX single   . Obesity 12/22/2015  . Secondary cardiomyopathy ([place])   . Syncope   . Ventricular septal defect   . Ventricular tachycardia ([place])    appropriate VT shock therapy /13    Past Surgical History:  Procedure Laterality Date  . ANGIOPLASTY     RCA repair with vein angioplasty following injury with the aforementioned surgery  . AORTIC VALVE REPLACEMENT     Bioprosthesis  . CARDIAC DEFIBRILLATOR PLACEMENT    . CARDIAC VALVE REPLACEMENT    .  CHOLECYSTECTOMY    . COLONOSCOPY WITH PROPOFOL N/A 12/06/2015   Procedure: COLONOSCOPY WITH PROPOFOL;  Surgeon: [name], MD;  Location: [facility] ENDOSCOPY;  Service: Endoscopy;  Laterality: N/A;  . CORONARY ARTERY BYPASS GRAFT    . IMPLANTABLE CARDIOVERTER DEFIBRILLATOR GENERATOR CHANGE N/A 05/05/2013   Procedure: IMPLANTABLE CARDIOVERTER DEFIBRILLATOR GENERATOR CHANGE;  Surgeon: Deboraha Sprang, MD;  Location: [facility] CATH LAB;  Service: Cardiovascular;  Laterality: N/A;  . INSERT / REPLACE / REMOVE PACEMAKER    . VSD REPAIR      Prior to Admission medications   Medication Sig Start Date End Date  Taking? Authorizing Provider  ATROVENT HFA 17 MCG/ACT inhaler Inhale 2 puffs into the lungs 4 (four) times daily as needed. 04/09/16   [provider]  carvedilol (COREG) 12.5 MG tablet Take 1 tablet (12.5 mg total) by mouth 2 (two) times daily. 12/10/16 12/05/17  Deboraha Sprang, MD  fluticasone (FLONASE) 50 MCG/ACT nasal spray Place 2 sprays into both nostrils daily. Patient taking differently: Place 2 sprays into both nostrils as needed.  08/17/15   Steele Sizer, MD  loratadine (CLARITIN) 5 MG chewable tablet Chew 5 mg by mouth daily as needed for allergies.    [provider]  magnesium oxide (MAG-OX) 400 (241.3 Mg) MG tablet Take 1 tablet (400 mg total) by mouth 2 (two) times daily. 05/17/16   Loletha Grayer, MD  Multiple Vitamin (MULTIVITAMIN) tablet Take 1 tablet by mouth daily.    [provider]  promethazine (PHENERGAN) 25 MG tablet Take 0.5-1 tablets (12.5-25 mg total) by mouth every 6 (six) hours as needed for nausea or vomiting. 05/09/17   Lada, Satira Anis, MD  ranolazine (RANEXA) 500 MG 12 hr tablet Take 1 tablet (500 mg total) by mouth 2 (two) times daily. 09/14/16   Deboraha Sprang, MD  sacubitril-valsartan (ENTRESTO) 24-26 MG Take 1 tablet by mouth 2 (two) times daily. 03/20/17 03/15/18  Deboraha Sprang, MD  sildenafil (REVATIO) 20 MG tablet Take 2-3 tablets (40-60 mg total) by mouth daily as needed. Never combine with nitrates 11/28/16   Arnetha Courser, MD  spironolactone (ALDACTONE) 25 MG tablet Take 1/2 tablet (12.5 mg) by mouth once daily 02/12/17   Deboraha Sprang, MD    Family History  Problem Relation Age of Onset  . Heart disease Father   . Hypertension Maternal Grandmother   . Cancer Maternal Grandmother   . Hypertension Maternal Grandfather   . Cancer Maternal Grandfather      Social History   Tobacco Use  . Smoking status: Never Smoker  . Smokeless tobacco: Never Used  Substance Use Topics  . Alcohol use: No  . Drug use: No     Allergies as of 05/15/2017 - Review Complete 05/09/2017  Allergen Reaction Noted  . Shellfish allergy Nausea And Vomiting 11/06/2010    Review of Systems:    All systems reviewed and negative except where noted in HPI.   Physical Exam:  There were no vitals taken for this visit. No LMP for male patient. Psych:  Alert and cooperative. Normal mood and affect. General:   Alert,  Well-developed, well-nourished, pleasant and cooperative in NAD Head:  Normocephalic and atraumatic. Eyes:  Sclera clear, no icterus.   Conjunctiva pink. Ears:  Normal auditory acuity. Nose:  No deformity, discharge, or lesions. Mouth:  No deformity or lesions,oropharynx pink & moist. Neck:  Supple; no masses or thyromegaly. Lungs:ICD in left infraclavicular area.   Respirations even and unlabored.  Clear  throughout to auscultation.   No wheezes, crackles, or rhonchi. No acute distress. Heart:  Regular rate and rhythm; no murmurs, clicks, rubs, or gallops. Abdomen:  Normal bowel sounds.  No bruits.  Soft, non-tender and non-distended without masses, hepatosplenomegaly or hernias noted.  No guarding or rebound tenderness.     Neurologic:  Alert and oriented x3;  grossly normal neurologically. Skin:  Intact without significant lesions or rashes. No jaundice. Lymph Nodes:  No significant cervical adenopathy. Psych:  Alert and cooperative. Normal mood and affect.  Imaging Studies: No results found.  Assessment and Plan:   SENAI KINGSLEY is a 41 y.o. y/o male has been referred for 3 weeks of epigastric discomfort and bloating , seems to gradually getting better. No other red flag signs. May be an acute episode of gastritis vs a self limiting GI bug with post infectious IBS like symptoms.    Plan  1. LOW FODMAP diet  2. Trial of Zantac 150 mg BID 3. IB guard samples provided  4. Stool H pylori antigen  5. If no better at next visit will have to consider EUS/EGD (h/o ICD) to r/o stone in CBD   Follow up  in 6-8 weeks   Dr Fernando Bellows MD,MRCP(U.K)

## 2017-05-15 NOTE — Patient Instructions (Addendum)
Thank you for choosing [facility] for your medical needs.  Today's care team consisted of [facility] CMA, and Dr. Jonathon Bellows.  Dr. Vicente Males has made the following suggestions for your care:  1. Begin eating from [employer]. 2. Go to [facility] for labs to be done. 3. If Zantac doesn't work, please try IBGuard.  This is all natural only contains additive of peppermint oil. 4. Follow up with Dr. Vicente Males in 6 weeks.  Thanks [place] CMA

## 2017-05-16 ENCOUNTER — Encounter: Payer: Self-pay | Admitting: Cardiology

## 2017-05-16 ENCOUNTER — Other Ambulatory Visit: Payer: Self-pay

## 2017-05-16 MED ORDER — SACUBITRIL-VALSARTAN 24-26 MG PO TABS: 1.0000 | ORAL_TABLET | Freq: Two times a day (BID) | ORAL | 0 refills | Status: AC

## 2017-05-16 NOTE — Progress Notes (Signed)
Remote ICD transmission.   

## 2017-05-22 DIAGNOSIS — R1013 Epigastric pain: Secondary | ICD-10-CM | POA: Diagnosis not present

## 2017-05-24 ENCOUNTER — Telehealth: Payer: Self-pay

## 2017-05-24 LAB — H. PYLORI ANTIGEN, STOOL: H pylori Ag, Stl: NEGATIVE

## 2017-05-24 LAB — SPECIMEN STATUS REPORT

## 2017-05-24 NOTE — Telephone Encounter (Signed)
Patient states condition is getting better with FODMAP diet.   He will continue for another 2 weeks and callback with an update on condition.

## 2017-05-24 NOTE — Telephone Encounter (Signed)
LVM for patient callback for results per Dr. Anna.    - H pylori stool antigen negative  

## 2017-05-27 LAB — CUP PACEART REMOTE DEVICE CHECK
Brady Statistic RV Percent Paced: 0 %
Date Time Interrogation Session: 20181226080100
HighPow Impedance: 55 Ohm
Implantable Lead Implant Date: 20080214
Implantable Lead Serial Number: 178017
Lead Channel Impedance Value: 624 Ohm
Lead Channel Pacing Threshold Amplitude: 1.2 V
Lead Channel Pacing Threshold Pulse Width: 0.4 ms
MDC IDC LEAD LOCATION: 753860
MDC IDC MSMT BATTERY REMAINING LONGEVITY: 120 mo
MDC IDC MSMT BATTERY REMAINING PERCENTAGE: 100 %
MDC IDC PG IMPLANT DT: 20141216
MDC IDC SET LEADCHNL RV PACING AMPLITUDE: 2.4 V
MDC IDC SET LEADCHNL RV PACING PULSEWIDTH: 0.4 ms
MDC IDC SET LEADCHNL RV SENSING SENSITIVITY: 0.6 mV
Pulse Gen Serial Number: 126781

## 2017-05-30 ENCOUNTER — Telehealth: Payer: Self-pay

## 2017-05-30 NOTE — Telephone Encounter (Signed)
Returned patient's call.   Unable to leave voicemail due to box being full.

## 2017-06-04 ENCOUNTER — Encounter: Payer: Self-pay | Admitting: Internal Medicine

## 2017-06-04 ENCOUNTER — Telehealth: Payer: Self-pay | Admitting: Internal Medicine

## 2017-06-04 NOTE — Telephone Encounter (Signed)
New Message  Patient calling the office for samples of medication:   1.  What medication and dosage are you requesting samples for? Entresto 24-26   2.  Are you currently out of this medication? Yes  Patient is requesting samples of Entresto.

## 2017-06-06 NOTE — Telephone Encounter (Signed)
Patient is currently having problems with insurance coverage & is requesting samples for at least 2 months.  Please advise if okay to give samples of Entresto 24-26 mg.

## 2017-06-06 NOTE — Telephone Encounter (Signed)
I think it's ok to give him samples, but I wouldn't give him 2 months at a time due to the amount of new starts we have. If we could give him 1 month to start if we have it, then that's ok, then we can go from there.

## 2017-06-07 NOTE — Telephone Encounter (Signed)
Patient notified samples of Entresto 24-26 mg available to pick up.  Medication Samples have been provided to the patient for:   Drug name: Entresto   Strength: 24-26 mg      Qty: 24  LOT: [id number] Exp.Date: 6/21  [name] 9:20 AM 06/07/2017

## 2017-06-27 ENCOUNTER — Ambulatory Visit: Payer: 59 | Admitting: Gastroenterology

## 2017-07-06 NOTE — Telephone Encounter (Signed)
Routing History   Priority Sent On From To Message Type   04/10/2017 9:10 AM [place], Laverda Sorenson, CMA Cathrine Muster, CMA    04/10/2017 8:43 AM Mychart, Generic P [facility] CL[facility]   Created by   Mychart, Generic on 04/10/2017 08:43 AM     Message just incidentally discovered doing a random audit of my patient's charts Will notify IT of this lapse I sent a patient response today upon finding his note

## 2017-07-31 ENCOUNTER — Ambulatory Visit (INDEPENDENT_AMBULATORY_CARE_PROVIDER_SITE_OTHER): Payer: BLUE CROSS/BLUE SHIELD | Admitting: Gastroenterology

## 2017-07-31 ENCOUNTER — Encounter: Payer: Self-pay | Admitting: Gastroenterology

## 2017-07-31 VITALS — BP 108/76 | HR 80 | Ht 70.0 in | Wt 212.8 lb

## 2017-07-31 DIAGNOSIS — K589 Irritable bowel syndrome without diarrhea: Secondary | ICD-10-CM | POA: Diagnosis not present

## 2017-07-31 NOTE — Progress Notes (Signed)
Jonathon Bellows MD, MRCP(U.K) [address]  [place]  [place], [place] [address]  Main: [phone]  Fax: [phone]   Primary Care Physician: Arnetha Courser, MD  Primary Gastroenterologist:  Dr. Jonathon Bellows   Chief Complaint  Patient presents with  . Follow-up    HPI: [name] is a 42 y.o. male    Summary of history : He is here today to follow-up to his last visit in December 2018.  At that time he was referred for abdominal pain and nausea.He has a history of surgically corrected congenital heart disease aortic valve replacement.  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  Has had an ICD implanted in the past.  History of ventricular tachycardia.  She had VSD repair.  LV systolic dysfunction.  In December 2018 he had 3 weeks of abdominal bout bloating epigastric pain . CT scan of the abdomen in May 2017 showed inflammatory changes and thickening of the colon from descending colon to rectum.  He had a colonoscopy with Dr Allen Norris in  12/08/2015 no polyps were found biopsies of the terminal ileum and random colon were negative for inflammation and microscopic colitis.  Celiac serology has been negative to.   05/09/2017 demonstrated normal CMP, lipase, HbA1c, CBC, amylase, urine analysis.  The abdominal pain was usually after he eats within an hour before meals he had a similar issue in 2017 the pain was in his upper abdomen sharp in nature and more on the right side.  Each episode lasted no more than 5 minutes.  Similar nature to his gallbladder pain but less intense.  He had been on Prilosec previously but has stopped due to concerns of long-term side effects.  He had bowel movements regular every other day soft and hard.  He has undergone a cholecystectomy in the past.  Bouts of bloating.  When he passes gas or burps he feels better.  At that time my impression was that there was probably an acute episode of gastritis that was  self-limiting versus postinfectious IBS-like symptoms.   Interval history   04/2017-  07/31/2016   Stool H pylori antigen negative . Things are much better. Tried the LOW FODMAP diet which helps, now gradually added more foods and doing well. Things it was either the whipped cream , resses butter cup. Consuming a regular diet and gaining weight. Normal bowel movements.   Current Outpatient Medications  Medication Sig Dispense Refill  . ATROVENT HFA 17 MCG/ACT inhaler Inhale 2 puffs into the lungs 4 (four) times daily as needed.    . carvedilol (COREG) 12.5 MG tablet Take 1 tablet (12.5 mg total) by mouth 2 (two) times daily. 60 tablet 11  . fluticasone (FLONASE) 50 MCG/ACT nasal spray Place 2 sprays into both nostrils daily. (Patient taking differently: Place 2 sprays into both nostrils as needed. ) 16 g 2  . loratadine (CLARITIN) 5 MG chewable tablet Chew 5 mg by mouth daily as needed for allergies.    . magnesium oxide (MAG-OX) 400 (241.3 Mg) MG tablet Take 1 tablet (400 mg total) by mouth 2 (two) times daily. 60 tablet 0  . Multiple Vitamin (MULTIVITAMIN) tablet Take 1 tablet by mouth daily.    . promethazine (PHENERGAN) 25 MG tablet Take 0.5-1 tablets (12.5-25 mg total) by mouth every 6 (six) hours as needed for nausea or vomiting. 20 tablet 0  . ranolazine (RANEXA) 500 MG 12 hr tablet Take 1 tablet (500 mg  total) by mouth 2 (two) times daily. 180 tablet 3  . sacubitril-valsartan (ENTRESTO) 24-26 MG Take 1 tablet by mouth 2 (two) times daily. 180 tablet 0  . sildenafil (REVATIO) 20 MG tablet Take 2-3 tablets (40-60 mg total) by mouth daily as needed. Never combine with nitrates 30 tablet 5  . spironolactone (ALDACTONE) 25 MG tablet Take 1/2 tablet (12.5 mg) by mouth once daily 30 tablet 6   No current facility-administered medications for this visit.     Allergies as of 07/31/2017 - Review Complete 07/31/2017  Allergen Reaction Noted  . Shellfish allergy Nausea And Vomiting 11/06/2010     ROS:  General: Negative for anorexia, weight loss, fever, chills, fatigue, weakness. ENT: Negative for hoarseness, difficulty swallowing , nasal congestion. CV: Negative for chest pain, angina, palpitations, dyspnea on exertion, peripheral edema.  Respiratory: Negative for dyspnea at rest, dyspnea on exertion, cough, sputum, wheezing.  GI: See history of present illness. GU:  Negative for dysuria, hematuria, urinary incontinence, urinary frequency, nocturnal urination.  Endo: Negative for unusual weight change.    Physical Examination:   BP 108/76 (BP Location: Right Arm, Patient Position: Sitting, Cuff Size: Large)   Pulse 80   Ht 5\' 10"  (1.778 m)   Wt 212 lb 12.8 oz (96.5 kg)   BMI 30.53 kg/m   General: Well-nourished, well-developed in no acute distress.  Eyes: No icterus. Conjunctivae pink. Mouth: Oropharyngeal mucosa moist and pink , no lesions erythema or exudate. Lungs: Clear to auscultation bilaterally. Non-labored. Heart: Regular rate and rhythm, no murmurs rubs or gallops.  Abdomen: Bowel sounds are normal, nontender, nondistended, no hepatosplenomegaly or masses, no abdominal bruits or hernia , no rebound or guarding.   Extremities: No lower extremity edema. No clubbing or deformities. Neuro: Alert and oriented x 3.  Grossly intact. Skin: Warm and dry, no jaundice.   Psych: Alert and cooperative, normal mood and affect.   Imaging Studies: No results found.  Assessment and Plan:   [name] is a 42 y.o. y/o male here to follow epigastric discomfort and bloating . History suggestive of post infectious IBS which seems to have resolved.    Dr Jonathon Bellows  MD,MRCP [facility]) Follow up in PRN

## 2017-08-14 ENCOUNTER — Ambulatory Visit (INDEPENDENT_AMBULATORY_CARE_PROVIDER_SITE_OTHER): Payer: BLUE CROSS/BLUE SHIELD | Admitting: *Deleted

## 2017-08-14 DIAGNOSIS — I472 Ventricular tachycardia, unspecified: Secondary | ICD-10-CM

## 2017-08-14 NOTE — Progress Notes (Signed)
Remote ICD transmission.   

## 2017-08-19 ENCOUNTER — Encounter: Payer: Self-pay | Admitting: Cardiology

## 2017-08-25 LAB — CUP PACEART REMOTE DEVICE CHECK
Battery Remaining Longevity: 114 mo
Date Time Interrogation Session: 20190327073800
HIGH POWER IMPEDANCE MEASURED VALUE: 55 Ohm
Implantable Lead Implant Date: 20080214
Implantable Lead Location: 753860
Implantable Lead Model: 185
Implantable Lead Serial Number: 178017
Implantable Pulse Generator Implant Date: 20141216
Lead Channel Impedance Value: 604 Ohm
Lead Channel Pacing Threshold Pulse Width: 0.4 ms
MDC IDC MSMT BATTERY REMAINING PERCENTAGE: 100 %
MDC IDC MSMT LEADCHNL RV PACING THRESHOLD AMPLITUDE: 1.2 V
MDC IDC SET LEADCHNL RV PACING AMPLITUDE: 2.4 V
MDC IDC SET LEADCHNL RV PACING PULSEWIDTH: 0.4 ms
MDC IDC SET LEADCHNL RV SENSING SENSITIVITY: 0.6 mV
MDC IDC STAT BRADY RV PERCENT PACED: 0 %
Pulse Gen Serial Number: 126781

## 2017-08-27 ENCOUNTER — Other Ambulatory Visit: Payer: Self-pay | Admitting: Physician Assistant

## 2017-08-27 ENCOUNTER — Telehealth: Payer: Self-pay | Admitting: Internal Medicine

## 2017-08-27 MED ORDER — RANOLAZINE ER 500 MG PO TB12: 500.0000 mg | ORAL_TABLET | Freq: Two times a day (BID) | ORAL | 3 refills | Status: DC

## 2017-08-27 NOTE — Telephone Encounter (Signed)
S/w patient. States he went to pick up prescription for Ranexa on Sunday and they said he needed a prior authorization. No requests received at this time. Samples for patient to be left a front desk. Patient verbalized understandings of samples and that we will work on the [place].  [employer]. They had sent the request to our [facility] office. She will resend the request to [place] at [phone]. Will complete PA once received.

## 2017-08-27 NOTE — Telephone Encounter (Signed)
Have not received the fax yet. Called [facility] and they will refax. Also, got the number to call in case fax does not come through. [phone].

## 2017-08-27 NOTE — Telephone Encounter (Signed)
Patient states he requires a PA and is out of Ranexa.  He says he called the on call number x 2 but has not received a response.  Patient calling the office for samples of medication:   1.  What medication and dosage are you requesting samples for? Ranexa 500 mg po BID   2.  Are you currently out of this medication? yes

## 2017-08-27 NOTE — Telephone Encounter (Signed)
   Patient stated he has been trying to get the Ranexa renewed but has not been able to do so.  He states that the pharmacy told him that the medication should be sent in as a generic.  I explained that we usually did that and the prescription is listed as ranolazine (Ranexa).  Stated that I would send in a refill on the prescription and did so.  He will try to pick it up later and if he has any concerns, he will call back.  Rosaria Ferries, PA-C 08/27/2017 7:18 PM Beeper [phone]

## 2017-08-27 NOTE — Telephone Encounter (Signed)
S/w [employer] [facility] representative. They do not handle the PA for this patient's plan. [employer] [phone] handles PA's. [place] and received message that they do not accept PA's over the phone and to download PA request form from www.[email or web address] under Rx Benefits. Form printed, filled out and will await signature from Dr Caryl Comes when in the office.

## 2017-08-28 NOTE — Telephone Encounter (Signed)
No answer. Left message to call back for update on ranexa process.

## 2017-08-28 NOTE — Telephone Encounter (Signed)
I spoke with [place]. She ran the ranolazine prescription while I was on the phone. Still indicating this medications needs PA. Updated patient the PA paperwork awaiting Dr. Olin Pia signature. Nira Conn, RN has the paperwork. Patient has 3 weeks worth of Ranexa samples. He understands he will be notified once this process is complete.

## 2017-08-28 NOTE — Telephone Encounter (Signed)
Patient returning call.

## 2017-09-04 NOTE — Telephone Encounter (Signed)
Paper work for [employer], along with supporting clinical information was faxed to [phone] for the patient's Ranexa. Confirmation received.  Awaiting PA outcome.

## 2017-09-16 NOTE — Telephone Encounter (Signed)
Late entry-  Fax received from RX benefits dated 09/04/17 that the patient's Ranexa did not require a Prior Authorization.

## 2017-10-22 ENCOUNTER — Other Ambulatory Visit: Payer: Self-pay | Admitting: Internal Medicine

## 2017-10-29 ENCOUNTER — Other Ambulatory Visit: Payer: Self-pay | Admitting: Family Medicine

## 2017-10-29 ENCOUNTER — Other Ambulatory Visit: Payer: Self-pay | Admitting: Internal Medicine

## 2017-10-30 ENCOUNTER — Other Ambulatory Visit: Payer: Self-pay | Admitting: Internal Medicine

## 2017-10-30 MED ORDER — PROMETHAZINE HCL 25 MG PO TABS: 12.5000 mg | ORAL_TABLET | Freq: Four times a day (QID) | ORAL | 0 refills | Status: DC | PRN

## 2017-11-12 ENCOUNTER — Encounter: Payer: Self-pay | Admitting: Internal Medicine

## 2017-11-12 ENCOUNTER — Other Ambulatory Visit
Admission: RE | Admit: 2017-11-12 | Discharge: 2017-11-12 | Disposition: A | Discharge status: Home / Self Care | Payer: BLUE CROSS/BLUE SHIELD | Source: Ambulatory Visit | Attending: Internal Medicine | Admitting: Internal Medicine

## 2017-11-12 ENCOUNTER — Ambulatory Visit (INDEPENDENT_AMBULATORY_CARE_PROVIDER_SITE_OTHER): Payer: BLUE CROSS/BLUE SHIELD | Admitting: Internal Medicine

## 2017-11-12 VITALS — BP 110/78 | HR 79 | Ht 69.5 in | Wt 209.5 lb

## 2017-11-12 DIAGNOSIS — I359 Nonrheumatic aortic valve disorder, unspecified: Secondary | ICD-10-CM

## 2017-11-12 DIAGNOSIS — I10 Essential (primary) hypertension: Secondary | ICD-10-CM | POA: Diagnosis present

## 2017-11-12 DIAGNOSIS — Z95810 Presence of automatic (implantable) cardiac defibrillator: Secondary | ICD-10-CM | POA: Diagnosis not present

## 2017-11-12 DIAGNOSIS — I428 Other cardiomyopathies: Secondary | ICD-10-CM | POA: Diagnosis not present

## 2017-11-12 DIAGNOSIS — I472 Ventricular tachycardia, unspecified: Secondary | ICD-10-CM

## 2017-11-12 LAB — BASIC METABOLIC PANEL
Anion gap: 5 (ref 5–15)
BUN: 14 mg/dL (ref 6–20)
CALCIUM: 9.4 mg/dL (ref 8.9–10.3)
CO2: 29 mmol/L (ref 22–32)
Chloride: 106 mmol/L (ref 98–111)
Creatinine, Ser: 1.15 mg/dL (ref 0.61–1.24)
GFR calc Af Amer: >60 mL/min (ref >60)
GLUCOSE: 90 mg/dL (ref 70–99)
Potassium: 4.3 mmol/L (ref 3.5–5.1)
SODIUM: 140 mmol/L (ref 135–145)

## 2017-11-12 NOTE — Patient Instructions (Addendum)
Medication Instructions: - Your physician recommends that you continue on your current medications as directed. Please refer to the Current Medication list given to you today.  Samples given today:  Entresto 24/26 mg Lot: [id number] Exp:9/20 # 2 boxes  Ranexa 500 mg Lot: [id number] Exp: 04/21 # 3 boxes  Labwork: - Your physician recommends that you have lab work today: [employer]  Procedures/Testing: - Your physician has requested that you have an echocardiogram (this week). Echocardiography is a painless test that uses sound waves to create images of your heart. It provides your doctor with information about the size and shape of your heart and how well your heart's chambers and valves are working. This procedure takes approximately one hour. There are no restrictions for this procedure.  Follow-Up: - Remote monitoring is used to monitor your Pacemaker of ICD from home. This monitoring reduces the number of office visits required to check your device to one time per year. It allows [place] to keep an eye on the functioning of your device to ensure it is working properly. You are scheduled for a device check from home on 11/13/17. You may send your transmission at any time that day. If you have a wireless device, the transmission will be sent automatically. After your physician reviews your transmission, you will receive a postcard with your next transmission date.  - Your physician wants you to follow-up in: 6 months with Dr. Caryl Comes. You will receive a reminder letter in the mail two months in advance. If you don't receive a letter, please call our office to schedule the follow-up appointment.   Any Additional Special Instructions Will Be Listed Below (If Applicable).     If you need a refill on your cardiac medications before your next appointment, please call your pharmacy.

## 2017-11-12 NOTE — Progress Notes (Signed)
Patient Care Team: Lada, Satira Anis, MD as PCP - General (Family Medicine) Carloyn Manner, MD as Referring Physician (Otolaryngology) Bary Castilla Forest Gleason, MD (General Surgery) Sanda Klein Satira Anis, MD as Attending Physician (Family Medicine)   HPI  ZYREN SEVIGNY is a 42 y.o. male Seen in followup for ventricular tachycardia occurring in the context of surgically corrected congenital heart disease With Aortic valve replacement.  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  My note from 2011 reports that at the time of surgery "he had damage to his right coronary artery and underwent repair." He is status post ICD implantation. 4/13 had appropriate shock for fast ventricular tachycardia He underwent device generator replacement 12/14    Echo March 2013 demonstrated left ventricular dysfunction with an ejection fraction of 40-45% with inferior posterior akinesis and aortic valve bioprosthesis. 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  Echo 12/14 demonstrated mild worsening of LV function 35-40%.with questions regarding aortic valve and possible stenosis with a bioprosthesis;  TEE 1/15 was undertaken by Dr. Deidre Ala demonstrated that normal LV function no significant aortic stenosis with well visualized aortic valve opening.  ( suspect incorrect 2/2 view axis)   DATE TEST    3/13    Echo   EF 40-45 % IP akinesis   1/15    TEE   EF 60 % "mechanical aortic valve"  2/17 Echo 45-50%   12/17 Echo EF 35-40 "bioprosthesis "  5/17 Echo EF 30-35%   8/18 CT EF 30-35 RCA Total without flow//aneurysm Inferior Wall LAD & CX w FFR > 80%   12/17  Admitted [place] for syncopal VT- PM  Antecedent VT PM in wake of viral illness  Ranexa and MAG added  He has had interval VTNS monomorphic and polymorphic     Antiarrhythmics Date Reason stopped  Ranolazine 12/17     Date Cr K  12/18 1.24 4.3           DATE TEST    6/18    HOLTER <1 % PVCs        He has had no edema.  He has noted exertional chest discomfort predictably while walking uphill.  This is a new undertaking for him. Some sob but this stable      Past Medical History:  Diagnosis Date  . Anxiety   . Aortic valve replaced    Requiring replacement, specifics not available  . Hearing loss of both ears 12/22/2015  . Heart murmur   . Hypertension   . IBD (inflammatory bowel disease) 09/23/2015  . ICD (implantable cardiac defibrillator), BSX single   . Obesity 12/22/2015  . Secondary cardiomyopathy ([place])   . Syncope   . Ventricular septal defect   . Ventricular tachycardia ([place])    appropriate VT shock therapy /13    Past Surgical History:  Procedure Laterality Date  . ANGIOPLASTY     RCA repair with vein angioplasty following injury with the aforementioned surgery  . AORTIC VALVE REPLACEMENT     Bioprosthesis  . CARDIAC DEFIBRILLATOR PLACEMENT    . CARDIAC VALVE REPLACEMENT    . CHOLECYSTECTOMY    . COLONOSCOPY WITH PROPOFOL N/A 12/06/2015   Procedure: COLONOSCOPY WITH PROPOFOL;  Surgeon: [name], MD;  Location: [facility] ENDOSCOPY;  Service: Endoscopy;  Laterality: N/A;  .  CORONARY ARTERY BYPASS GRAFT    . IMPLANTABLE CARDIOVERTER DEFIBRILLATOR GENERATOR CHANGE N/A 05/05/2013   Procedure: IMPLANTABLE CARDIOVERTER DEFIBRILLATOR GENERATOR CHANGE;  Surgeon: Deboraha Sprang, MD;  Location: [facility] CATH LAB;  Service: Cardiovascular;  Laterality: N/A;  . INSERT / REPLACE / REMOVE PACEMAKER    . VSD REPAIR      Current Outpatient Medications  Medication Sig Dispense Refill  . ATROVENT HFA 17 MCG/ACT inhaler Inhale 2 puffs into the lungs 4 (four) times daily as needed.    . carvedilol (COREG) 12.5 MG tablet Take 1 tablet (12.5 mg total) by mouth 2 (two) times daily. 180 tablet 0  . fluticasone (FLONASE) 50 MCG/ACT nasal spray  Place 2 sprays into both nostrils daily. (Patient taking differently: Place 2 sprays into both nostrils as needed. ) 16 g 2  . loratadine (CLARITIN) 5 MG chewable tablet Chew 5 mg by mouth daily as needed for allergies.    . magnesium oxide (MAG-OX) 400 (241.3 Mg) MG tablet Take 1 tablet (400 mg total) by mouth 2 (two) times daily. 60 tablet 0  . Multiple Vitamin (MULTIVITAMIN) tablet Take 1 tablet by mouth daily.    . promethazine (PHENERGAN) 25 MG tablet Take 0.5-1 tablets (12.5-25 mg total) by mouth every 6 (six) hours as needed for nausea or vomiting. 20 tablet 0  . ranolazine (RANEXA) 500 MG 12 hr tablet Take 1 tablet (500 mg total) by mouth 2 (two) times daily. 180 tablet 3  . sacubitril-valsartan (ENTRESTO) 24-26 MG Take 1 tablet by mouth 2 (two) times daily. PLEASE CALL THE OFFICE TO SCHEDULE YOUR FOLLOW UP APPT WITH DR. Caryl Comes 60 tablet 0  . sildenafil (REVATIO) 20 MG tablet Take 2-3 tablets (40-60 mg total) by mouth daily as needed. Never combine with nitrates 30 tablet 5  . spironolactone (ALDACTONE) 25 MG tablet Take 1/2 tablet (12.5 mg) by mouth once daily 30 tablet 6   No current facility-administered medications for this visit.     Allergies  Allergen Reactions  . Shellfish Allergy Nausea And Vomiting    Review of Systems negative except from HPI and PMH  Physical Exam BP 110/78 (BP Location: Left Arm, Patient Position: Sitting, Cuff Size: Normal)   Pulse 79   Ht 5' 9.5" (1.765 m)   Wt 209 lb 8 oz (95 kg)   BMI 30.49 kg/m  Well developed and nourished in no acute distress HENT normal Neck supple with JVP-flat Clear Regular rate and rhythm, 2/6 M Abd-soft with active BS No Clubbing cyanosis edema Skin-warm and dry A & Oriented  Grossly normal sensory and motor function  ECG sinus at 79 Interval 16/11/38   Assessment and plan  Ventricular tachycardia   Hypertension   Aortic valve-bioprosthesis   ICD-[employer]  The patient's device was interrogated.   The information was reviewed. No changes were made in the programming.     Cardiomyopathy-secondary  PVCs-infrequent  RCA No flow, presumed 2/2 surgery As above   Chest Pain    Nonsustained ventricular tachycardia noted.  No therapy. >> check electrolytes   His chest pain is concerning for ischemia.  He has known no flow in his RCA.  I do not know whether this is collateral deficits or progression of his left sided circulation.  He will let us know as to whether there is change.  We will start him on low-dose aspirin  Symptoms are significantly better since starting Entresto.  We will check an left ventricular VF  Euvolemic continue current meds  BP well controlled  He still would like referral to [facility] but currently is between jobs so will let us know

## 2017-11-13 ENCOUNTER — Ambulatory Visit (INDEPENDENT_AMBULATORY_CARE_PROVIDER_SITE_OTHER): Payer: BLUE CROSS/BLUE SHIELD | Admitting: *Deleted

## 2017-11-13 DIAGNOSIS — I472 Ventricular tachycardia, unspecified: Secondary | ICD-10-CM

## 2017-11-13 NOTE — Progress Notes (Signed)
Remote ICD transmission.   

## 2017-11-14 ENCOUNTER — Encounter: Payer: Self-pay | Admitting: Cardiology

## 2017-11-15 ENCOUNTER — Other Ambulatory Visit: Payer: Self-pay

## 2017-11-15 ENCOUNTER — Ambulatory Visit (INDEPENDENT_AMBULATORY_CARE_PROVIDER_SITE_OTHER): Payer: BLUE CROSS/BLUE SHIELD

## 2017-11-15 DIAGNOSIS — I428 Other cardiomyopathies: Secondary | ICD-10-CM | POA: Diagnosis not present

## 2017-11-15 DIAGNOSIS — I472 Ventricular tachycardia, unspecified: Secondary | ICD-10-CM

## 2017-11-15 LAB — CUP PACEART REMOTE DEVICE CHECK
Battery Remaining Longevity: 114 mo
Battery Remaining Percentage: 100 %
Brady Statistic RV Percent Paced: 0 %
Date Time Interrogation Session: 20190626070200
HIGH POWER IMPEDANCE MEASURED VALUE: 55 Ohm
Implantable Lead Location: 753860
Implantable Pulse Generator Implant Date: 20141216
Lead Channel Setting Pacing Amplitude: 2.4 V
Lead Channel Setting Pacing Pulse Width: 0.4 ms
Lead Channel Setting Sensing Sensitivity: 0.6 mV
MDC IDC LEAD IMPLANT DT: 20080214
MDC IDC LEAD SERIAL: 178017
MDC IDC MSMT LEADCHNL RV IMPEDANCE VALUE: 623 Ohm
MDC IDC MSMT LEADCHNL RV PACING THRESHOLD AMPLITUDE: 1.1 V
MDC IDC MSMT LEADCHNL RV PACING THRESHOLD PULSEWIDTH: 0.4 ms
MDC IDC PG SERIAL: 126781

## 2017-12-17 ENCOUNTER — Telehealth: Payer: Self-pay | Admitting: Internal Medicine

## 2017-12-17 NOTE — Telephone Encounter (Signed)
I called and spoke with the patient.  He is currently without any insurance at this time and will not have commercial insurance until 01/19/18. I have advised him that I have a box of entresto pulled for him that I will leave at the front desk along with patient assistance applications for Ranexa & Entresto.   The patient is aware and agreeable. He will pick these up and complete his part and return to the office for Dr. Caryl Comes to complete his part and fax to the 2 companies.   Entresto 24/26 mg Lot: [id number] Exp: 6/21 #1 box

## 2017-12-17 NOTE — Telephone Encounter (Signed)
Please advise if you would like me to supply samples for patient.

## 2017-12-17 NOTE — Telephone Encounter (Signed)
Patient calling asking if there is somehow we can help him  He states he is having insurance issues and is needing some help with his medications.   Both his Renexa and Entresto. He is not able to get them from pharmacy   Would like a call back on what else can he do to just get him through one more month.  Please advise

## 2017-12-27 ENCOUNTER — Encounter: Payer: 59 | Admitting: Family Medicine

## 2017-12-27 ENCOUNTER — Telehealth: Payer: Self-pay | Admitting: Internal Medicine

## 2017-12-27 NOTE — Telephone Encounter (Signed)
Medication Samples have been provided to the patient.  Drug name: Delene Loll      Strength: 24-26       Qty: 1 BOX  LOT: [id number]  Exp.Date: 10/2019   Janan Ridge 5:01 PM 12/27/2017  Patient notified.  Samples are placed up front.

## 2017-12-27 NOTE — Telephone Encounter (Signed)
New Message   Patient calling the office for samples of medication:   1.  What medication and dosage are you requesting samples for? sacubitril-valsartan (ENTRESTO) 24-26 MG  2.  Are you currently out of this medication? Yes, states he needs samples up until Sep

## 2017-12-27 NOTE — Telephone Encounter (Signed)
Please advise if you would like me to provide samples for this patient.  Looks like patient just got some samples on 12/17/2017 We only have 2 boxes of this dose of Entresto in the drug closet.

## 2017-12-27 NOTE — Telephone Encounter (Signed)
OK to give 1 box. He was given patient assistance forms to complete. Can you please ask him if he has done that and can bring them back?  We are not guaranteed samples of entresto at this point due to a manufacturing back log per the rep.   Thanks!

## 2017-12-30 ENCOUNTER — Other Ambulatory Visit: Payer: Self-pay | Admitting: Physician Assistant

## 2018-01-15 ENCOUNTER — Other Ambulatory Visit: Payer: Self-pay | Admitting: Internal Medicine

## 2018-01-16 ENCOUNTER — Other Ambulatory Visit: Payer: Self-pay | Admitting: Family Medicine

## 2018-01-16 DIAGNOSIS — N529 Male erectile dysfunction, unspecified: Secondary | ICD-10-CM

## 2018-01-16 NOTE — Telephone Encounter (Signed)
Last OV: 04/2017  Next:9/26

## 2018-01-16 NOTE — Telephone Encounter (Signed)
Copied from [place] [id number]. Topic: Quick Communication - See Telephone Encounter >> Jan 16, 2018 12:37 PM Mylinda Latina, NT wrote: CRM for notification. See Telephone encounter for: 01/16/18. Patient called and states he needs a refill of his sildenafil (REVATIO) 20 MG tablet  [id number] [address], [place] [phone] (Phone) [phone] (Fax)

## 2018-01-17 MED ORDER — SILDENAFIL CITRATE 20 MG PO TABS: 40.0000 mg | ORAL_TABLET | Freq: Every day | ORAL | 0 refills | Status: DC | PRN

## 2018-01-21 ENCOUNTER — Other Ambulatory Visit: Payer: Self-pay

## 2018-01-21 MED ORDER — CARVEDILOL 12.5 MG PO TABS: 12.5000 mg | ORAL_TABLET | Freq: Two times a day (BID) | ORAL | 0 refills | Status: DC

## 2018-01-27 ENCOUNTER — Other Ambulatory Visit: Payer: Self-pay | Admitting: Nurse Practitioner

## 2018-01-27 DIAGNOSIS — N529 Male erectile dysfunction, unspecified: Secondary | ICD-10-CM

## 2018-01-27 LAB — CUP PACEART INCLINIC DEVICE CHECK
HIGH POWER IMPEDANCE MEASURED VALUE: 54 Ohm
HighPow Impedance: 42 Ohm
Implantable Lead Location: 753860
Implantable Lead Model: 185
Implantable Lead Serial Number: 178017
Lead Channel Impedance Value: 625 Ohm
Lead Channel Pacing Threshold Amplitude: 1.1 V
Lead Channel Setting Pacing Amplitude: 2.4 V
Lead Channel Setting Pacing Pulse Width: 0.4 ms
Lead Channel Setting Sensing Sensitivity: 0.6 mV
MDC IDC LEAD IMPLANT DT: 20080214
MDC IDC MSMT LEADCHNL RV PACING THRESHOLD PULSEWIDTH: 0.4 ms
MDC IDC MSMT LEADCHNL RV SENSING INTR AMPL: 20.4 mV
MDC IDC PG IMPLANT DT: 20141216
MDC IDC SESS DTM: 20190625040000
Pulse Gen Serial Number: 126781

## 2018-01-29 ENCOUNTER — Other Ambulatory Visit: Payer: Self-pay

## 2018-01-29 MED ORDER — RANOLAZINE ER 500 MG PO TB12: ORAL_TABLET | ORAL | 3 refills | Status: DC

## 2018-02-07 ENCOUNTER — Encounter: Payer: Self-pay | Admitting: Family Medicine

## 2018-02-12 ENCOUNTER — Ambulatory Visit (INDEPENDENT_AMBULATORY_CARE_PROVIDER_SITE_OTHER): Payer: 59 | Admitting: *Deleted

## 2018-02-12 ENCOUNTER — Other Ambulatory Visit: Payer: Self-pay | Admitting: Internal Medicine

## 2018-02-12 DIAGNOSIS — I428 Other cardiomyopathies: Secondary | ICD-10-CM

## 2018-02-12 NOTE — Progress Notes (Signed)
Remote ICD transmission.   

## 2018-02-12 NOTE — Telephone Encounter (Signed)
[facility] mail order pharmacy calling requesting a refill Ranexa 500 mg tablet. This medication was sent to another pharmacy. 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 Please address

## 2018-02-13 ENCOUNTER — Other Ambulatory Visit: Payer: Self-pay

## 2018-02-13 ENCOUNTER — Ambulatory Visit (INDEPENDENT_AMBULATORY_CARE_PROVIDER_SITE_OTHER): Payer: 59 | Admitting: Family Medicine

## 2018-02-13 ENCOUNTER — Encounter: Payer: Self-pay | Admitting: Family Medicine

## 2018-02-13 ENCOUNTER — Encounter: Payer: Self-pay | Admitting: Cardiology

## 2018-02-13 VITALS — BP 118/70 | HR 82 | Temp 98.3°F | Ht 70.0 in | Wt 214.4 lb

## 2018-02-13 DIAGNOSIS — Z0000 Encounter for general adult medical examination without abnormal findings: Secondary | ICD-10-CM | POA: Diagnosis not present

## 2018-02-13 DIAGNOSIS — Z125 Encounter for screening for malignant neoplasm of prostate: Secondary | ICD-10-CM

## 2018-02-13 DIAGNOSIS — Z23 Encounter for immunization: Secondary | ICD-10-CM | POA: Diagnosis not present

## 2018-02-13 LAB — COMPLETE METABOLIC PANEL WITH GFR
AG RATIO: 1.5 (calc) (ref 1.0–2.5)
ALBUMIN MSPROF: 4.6 g/dL (ref 3.6–5.1)
ALT: 35 U/L (ref 9–46)
AST: 30 U/L (ref 10–40)
Alkaline phosphatase (APISO): 30 U/L — ABNORMAL LOW (ref 40–115)
BUN: 13 mg/dL (ref 7–25)
CHLORIDE: 105 mmol/L (ref 98–110)
CO2: 28 mmol/L (ref 20–32)
Calcium: 9.9 mg/dL (ref 8.6–10.3)
Creat: 1.15 mg/dL (ref 0.60–1.35)
GFR, Est African American: 90 mL/min/{1.73_m2} (ref >=60)
GFR, Est Non African American: 78 mL/min/{1.73_m2} (ref >=60)
Globulin: 3 g/dL (calc) (ref 1.9–3.7)
Glucose, Bld: 93 mg/dL (ref 65–99)
Potassium: 4.3 mmol/L (ref 3.5–5.3)
Sodium: 139 mmol/L (ref 135–146)
TOTAL PROTEIN: 7.6 g/dL (ref 6.1–8.1)
Total Bilirubin: 0.9 mg/dL (ref 0.2–1.2)

## 2018-02-13 LAB — CBC WITH DIFFERENTIAL/PLATELET
BASOS PCT: 0.9 %
Basophils Absolute: 41 cells/uL (ref 0–200)
EOS ABS: 101 {cells}/uL (ref 15–500)
Eosinophils Relative: 2.2 %
HEMATOCRIT: 43.4 % (ref 38.5–50.0)
HEMOGLOBIN: 14.5 g/dL (ref 13.2–17.1)
LYMPHS ABS: 1164 {cells}/uL (ref 850–3900)
MCH: 28.4 pg (ref 27.0–33.0)
MCHC: 33.4 g/dL (ref 32.0–36.0)
MCV: 85.1 fL (ref 80.0–100.0)
MPV: 11 fL (ref 7.5–12.5)
Monocytes Relative: 12 %
NEUTROS ABS: 2742 {cells}/uL (ref 1500–7800)
Neutrophils Relative %: 59.6 %
PLATELETS: 157 10*3/uL (ref 140–400)
RBC: 5.1 10*6/uL (ref 4.20–5.80)
RDW: 11.5 % (ref 11.0–15.0)
TOTAL LYMPHOCYTE: 25.3 %
WBC: 4.6 10*3/uL (ref 3.8–10.8)
WBCMIX: 552 {cells}/uL (ref 200–950)

## 2018-02-13 LAB — LIPID PANEL
CHOLESTEROL: 165 mg/dL (ref <200)
HDL: 38 mg/dL — AB (ref >40)
LDL CHOLESTEROL (CALC): 110 mg/dL — AB
Non-HDL Cholesterol (Calc): 127 mg/dL (calc) (ref <130)
TRIGLYCERIDES: 80 mg/dL (ref <150)
Total CHOL/HDL Ratio: 4.3 (calc) (ref <5.0)

## 2018-02-13 LAB — PSA: PSA: 1.1 ng/mL (ref <=4.0)

## 2018-02-13 LAB — TSH: TSH: 2.19 mIU/L (ref 0.40–4.50)

## 2018-02-13 MED ORDER — RANOLAZINE ER 500 MG PO TB12: ORAL_TABLET | ORAL | 1 refills | Status: DC

## 2018-02-13 NOTE — Assessment & Plan Note (Signed)
USPSTF grade A and B recommendations reviewed with patient; age-appropriate recommendations, preventive care, screening tests, etc discussed and encouraged; healthy living encouraged; see AVS for patient education given to patient  

## 2018-02-13 NOTE — Progress Notes (Signed)
BP 118/70   Pulse 82   Temp 98.3 F (36.8 C)   Ht 5\' 10"  (1.778 m)   Wt 214 lb 6.4 oz (97.3 kg)   SpO2 98%   BMI 30.76 kg/m    Subjective:    Patient ID: [name], male    DOB: 08-Apr-1976, 42 y.o.   MRN: [id number]  HPI: [name] is a 42 y.o. male  Chief Complaint  Patient presents with  . Annual Exam    HPI Patient is here for his complete physical  USPSTF grade A and B recommendations Depression:  Depression screen [facility] 2/9 02/13/2018 02/13/2018 05/09/2017 12/25/2016 11/28/2016  Decreased Interest 0 0 0 0 0  Down, Depressed, Hopeless 0 0 0 0 0  PHQ - 2 Score 0 0 0 0 0  Altered sleeping 1 - - - -  Tired, decreased energy 1 - - - -  Change in appetite 0 - - - -  Feeling bad or failure about yourself  0 - - - -  Trouble concentrating 0 - - - -  Moving slowly or fidgety/restless 0 - - - -  Suicidal thoughts 0 - - - -  PHQ-9 Score 2 - - - -  Difficult doing work/chores Somewhat difficult - - - -   Hypertension: BP Readings from Last 3 Encounters:  02/13/18 118/70  11/12/17 110/78  07/31/17 108/76   Obesity: weight gain lately, [employer] cakes; watchful over the winter Wt Readings from Last 3 Encounters:  02/13/18 214 lb 6.4 oz (97.3 kg)  11/12/17 209 lb 8 oz (95 kg)  07/31/17 212 lb 12.8 oz (96.5 kg)   BMI Readings from Last 3 Encounters:  02/13/18 30.76 kg/m  11/12/17 30.49 kg/m  07/31/17 30.53 kg/m    Immunizations: flu shot today Skin cancer: no worrisome moles Lung cancer:  nonsmoker Prostate cancer: discussed and patient requests PSA Lab Results  Component Value Date   PSA 1.1 02/13/2018   Colorectal cancer: July 2017; next ten years later, due July 2027 AAA: n/a Aspirin: n/a Diet: working on it,  Exercise: not often as he'd like, because of his work Alcohol:    [facility] Visit from 02/13/2018 in [facility]  AUDIT-C Score  0      Tobacco use: non HIV, hep B, hep C: not interested STD testing and  prevention (chl/gon/syphilis): no interested Glucose:  Glucose  Date Value Ref Range Status  05/09/2017 99 65 - 99 mg/dL Final  02/26/2017 92 65 - 99 mg/dL Final  12/25/2016 95 65 - 99 mg/dL Final   Glucose, Bld  Date Value Ref Range Status  02/13/2018 93 65 - 99 mg/dL Final    Comment:    .            Fasting reference interval .   11/12/2017 90 70 - 99 mg/dL Final    Comment:    Please note change in reference range.  05/16/2016 94 65 - 99 mg/dL Final   Glucose-Capillary  Date Value Ref Range Status  09/05/2011 112 (H) 70 - 99 mg/dL Final   Lipids:  Lab Results  Component Value Date   CHOL 165 02/13/2018   CHOL 160 12/25/2016   CHOL 144 11/28/2016   Lab Results  Component Value Date   HDL 38 (L) 02/13/2018   HDL 44 12/25/2016   HDL 42 11/28/2016   Lab Results  Component Value Date   LDLCALC 110 (H) 02/13/2018   LDLCALC 92  12/25/2016   LDLCALC 88 11/28/2016   Lab Results  Component Value Date   TRIG 80 02/13/2018   TRIG 121 12/25/2016   TRIG 68 11/28/2016   Lab Results  Component Value Date   CHOLHDL 4.3 02/13/2018   CHOLHDL 3.6 12/25/2016   CHOLHDL 3.4 11/28/2016   No results found for: LDLDIRECT   Depression screen [facility] 2/9 02/13/2018 02/13/2018 05/09/2017 12/25/2016 11/28/2016  Decreased Interest 0 0 0 0 0  Down, Depressed, Hopeless 0 0 0 0 0  PHQ - 2 Score 0 0 0 0 0  Altered sleeping 1 - - - -  Tired, decreased energy 1 - - - -  Change in appetite 0 - - - -  Feeling bad or failure about yourself  0 - - - -  Trouble concentrating 0 - - - -  Moving slowly or fidgety/restless 0 - - - -  Suicidal thoughts 0 - - - -  PHQ-9 Score 2 - - - -  Difficult doing work/chores Somewhat difficult - - - -   probably work-related; new job  Fall Risk  05/09/2017 12/25/2016 11/28/2016 05/29/2016 12/22/2015  Falls in the past year? No No No No No    Relevant past medical, surgical, family and social history reviewed Past Medical History:  Diagnosis Date  . Anxiety    . Aortic valve replaced    Requiring replacement, specifics not available  . Hearing loss of both ears 12/22/2015  . Heart murmur   . Hypertension   . IBD (inflammatory bowel disease) 09/23/2015  . ICD (implantable cardiac defibrillator), BSX single   . Obesity 12/22/2015  . Secondary cardiomyopathy ([place])   . Syncope   . Ventricular septal defect   . Ventricular tachycardia ([place])    appropriate VT shock therapy /13   Past Surgical History:  Procedure Laterality Date  . ANGIOPLASTY     RCA repair with vein angioplasty following injury with the aforementioned surgery  . AORTIC VALVE REPLACEMENT     Bioprosthesis  . CARDIAC DEFIBRILLATOR PLACEMENT    . CARDIAC VALVE REPLACEMENT    . CHOLECYSTECTOMY    . COLONOSCOPY WITH PROPOFOL N/A 12/06/2015   Procedure: COLONOSCOPY WITH PROPOFOL;  Surgeon: [name], MD;  Location: [facility] ENDOSCOPY;  Service: Endoscopy;  Laterality: N/A;  . CORONARY ARTERY BYPASS GRAFT    . IMPLANTABLE CARDIOVERTER DEFIBRILLATOR GENERATOR CHANGE N/A 05/05/2013   Procedure: IMPLANTABLE CARDIOVERTER DEFIBRILLATOR GENERATOR CHANGE;  Surgeon: Deboraha Sprang, MD;  Location: [facility] CATH LAB;  Service: Cardiovascular;  Laterality: N/A;  . INSERT / REPLACE / REMOVE PACEMAKER    . VSD REPAIR     Family History  Problem Relation Age of Onset  . Heart disease Father   . Hypertension Maternal Grandmother   . Cancer Maternal Grandmother   . Hypertension Maternal Grandfather   . Cancer Maternal Grandfather    Social History   Tobacco Use  . Smoking status: Never Smoker  . Smokeless tobacco: Never Used  Substance Use Topics  . Alcohol use: No  . Drug use: No     [facility] Visit from 02/13/2018 in [facility]  AUDIT-C Score  0      Interim medical history since last visit reviewed. Allergies and medications reviewed  Review of Systems  Respiratory: Negative for wheezing.   Cardiovascular: Negative for chest pain.  Gastrointestinal: Negative for  blood in stool.  Genitourinary: Negative for hematuria.   Per HPI unless specifically indicated above     Objective:  BP 118/70   Pulse 82   Temp 98.3 F (36.8 C)   Ht 5\' 10"  (1.778 m)   Wt 214 lb 6.4 oz (97.3 kg)   SpO2 98%   BMI 30.76 kg/m   Wt Readings from Last 3 Encounters:  02/13/18 214 lb 6.4 oz (97.3 kg)  11/12/17 209 lb 8 oz (95 kg)  07/31/17 212 lb 12.8 oz (96.5 kg)    Physical Exam  Constitutional: He appears well-developed and well-nourished. No distress.  HENT:  Head: Normocephalic and atraumatic.  Nose: Nose normal.  Mouth/Throat: Oropharynx is clear and moist.  Eyes: EOM are normal. No scleral icterus.  Neck: No JVD present. No thyromegaly present.  Cardiovascular: Normal rate, regular rhythm and normal heart sounds.  Pulmonary/Chest: Effort normal and breath sounds normal. No respiratory distress. He has no wheezes. He has no rales.  Abdominal: Soft. Bowel sounds are normal. He exhibits no distension. There is no tenderness. There is no guarding.  Musculoskeletal: Normal range of motion. He exhibits no edema.  Lymphadenopathy:    He has no cervical adenopathy.  Neurological: He is alert. He displays normal reflexes. He exhibits normal muscle tone. Coordination normal.  Skin: Skin is warm and dry. No rash noted. He is not diaphoretic. No erythema. No pallor.  Psychiatric: He has a normal mood and affect. His behavior is normal. Judgment and thought content normal.      Assessment & Plan:   Problem List Items Addressed This Visit      Other   Prostate cancer screening   Relevant Orders   PSA (Completed)   Preventative health care - Primary    USPSTF grade A and B recommendations reviewed with patient; age-appropriate recommendations, preventive care, screening tests, etc discussed and encouraged; healthy living encouraged; see AVS for patient education given to patient       Relevant Orders   CBC with Differential/Platelet (Completed)   COMPLETE  METABOLIC PANEL WITH GFR (Completed)   Lipid panel (Completed)   TSH (Completed)    Other Visit Diagnoses    Need for influenza vaccination       Relevant Orders   Flu Vaccine QUAD 6+ mos PF IM (Fluarix Quad PF) (Completed)       Follow up plan: Return in about 1 year (around 02/14/2019) for complete physical.  An after-visit summary was printed and given to the patient at [place].  Please see the patient instructions which may contain other information and recommendations beyond what is mentioned above in the assessment and plan.  No orders of the defined types were placed in this encounter.   Orders Placed This Encounter  Procedures  . Flu Vaccine QUAD 6+ mos PF IM (Fluarix Quad PF)  . PSA  . CBC with Differential/Platelet  . COMPLETE METABOLIC PANEL WITH GFR  . Lipid panel  . TSH

## 2018-02-13 NOTE — Patient Instructions (Addendum)
Check out with your heart doctor if you can drink fruit smoothies  Check out the information at familydoctor.org entitled "Nutrition for Weight Loss: What You Need to Know about Fad Diets" Try to lose between 1-2 pounds per week by taking in fewer calories and burning off more calories You can succeed by limiting portions, limiting foods dense in calories and fat, becoming more active, and drinking 8 glasses of water a day (64 ounces) Don't skip meals, especially breakfast, as skipping meals may alter your metabolism Do not use over-the-counter weight loss pills or gimmicks that claim rapid weight loss A healthy BMI (or body mass index) is between 18.5 and 24.9 You can calculate your ideal BMI at the [place] website ClubMonetize.fr  Consider miralax to keep stools really soft to help hemorrhoids; half of 17 grams in water daily or less     Health Maintenance, Male A healthy lifestyle and preventive care is important for your health and wellness. Ask your health care provider about what schedule of regular examinations is right for you. What should I know about weight and diet? Eat a Healthy Diet  Eat plenty of vegetables, fruits, whole grains, low-fat dairy products, and lean protein.  Do not eat a lot of foods high in solid fats, added sugars, or salt.  Maintain a Healthy Weight Regular exercise can help you achieve or maintain a healthy weight. You should:  Do at least 150 minutes of exercise each week. The exercise should increase your heart rate and make you sweat (moderate-intensity exercise).  Do strength-training exercises at least twice a week.  Watch Your Levels of Cholesterol and Blood Lipids  Have your blood tested for lipids and cholesterol every 5 years starting at 44 years of age. If you are at high risk for heart disease, you should start having your blood tested when you are 44 years old. You may need to have your  cholesterol levels checked more often if: ? Your lipid or cholesterol levels are high. ? You are older than 44 years of age. ? You are at high risk for heart disease.  What should I know about cancer screening? Many types of cancers can be detected early and may often be prevented. Lung Cancer  You should be screened every year for lung cancer if: ? You are a current smoker who has smoked for at least 30 years. ? You are a former smoker who has quit within the past 15 years.  Talk to your health care provider about your screening options, when you should start screening, and how often you should be screened.  Colorectal Cancer  Routine colorectal cancer screening usually begins at 44 years of age and should be repeated every 5-10 years until you are 44 years old. You may need to be screened more often if early forms of precancerous polyps or small growths are found. Your health care provider may recommend screening at an earlier age if you have risk factors for colon cancer.  Your health care provider may recommend using home test kits to check for hidden blood in the stool.  A small camera at the end of a tube can be used to examine your colon (sigmoidoscopy or colonoscopy). This checks for the earliest forms of colorectal cancer.  Prostate and Testicular Cancer  Depending on your age and overall health, your health care provider may do certain tests to screen for prostate and testicular cancer.  Talk to your health care provider about any symptoms or concerns you  have about testicular or prostate cancer.  Skin Cancer  Check your skin from head to toe regularly.  Tell your health care provider about any new moles or changes in moles, especially if: ? There is a change in a mole's size, shape, or color. ? You have a mole that is larger than a pencil eraser.  Always use sunscreen. Apply sunscreen liberally and repeat throughout the day.  Protect yourself by wearing long sleeves,  pants, a wide-brimmed hat, and sunglasses when outside.  What should I know about heart disease, diabetes, and high blood pressure?  If you are 71-64 years of age, have your blood pressure checked every 3-5 years. If you are 9 years of age or older, have your blood pressure checked every year. You should have your blood pressure measured twice-once when you are at a hospital or clinic, and once when you are not at a hospital or clinic. Record the average of the two measurements. To check your blood pressure when you are not at a hospital or clinic, you can use: ? An automated blood pressure machine at a pharmacy. ? A home blood pressure monitor.  Talk to your health care provider about your target blood pressure.  If you are between 12-72 years old, ask your health care provider if you should take aspirin to prevent heart disease.  Have regular diabetes screenings by checking your fasting blood sugar level. ? If you are at a normal weight and have a low risk for diabetes, have this test once every three years after the age of 37. ? If you are overweight and have a high risk for diabetes, consider being tested at a younger age or more often.  A one-time screening for abdominal aortic aneurysm (AAA) by ultrasound is recommended for men aged 28-75 years who are current or former smokers. What should I know about preventing infection? Hepatitis B If you have a higher risk for hepatitis B, you should be screened for this virus. Talk with your health care provider to find out if you are at risk for hepatitis B infection. Hepatitis C Blood testing is recommended for:  Everyone born from [birth year] through 1967.  Anyone with known risk factors for hepatitis C.  Sexually Transmitted Diseases (STDs)  You should be screened each year for STDs including gonorrhea and chlamydia if: ? You are sexually active and are younger than 44 years of age. ? You are older than 44 years of age and your health care  provider tells you that you are at risk for this type of infection. ? Your sexual activity has changed since you were last screened and you are at an increased risk for chlamydia or gonorrhea. Ask your health care provider if you are at risk.  Talk with your health care provider about whether you are at high risk of being infected with HIV. Your health care provider may recommend a prescription medicine to help prevent HIV infection.  What else can I do?  Schedule regular health, dental, and eye exams.  Stay current with your vaccines (immunizations).  Do not use any tobacco products, such as cigarettes, chewing tobacco, and e-cigarettes. If you need help quitting, ask your health care provider.  Limit alcohol intake to no more than 2 drinks per day. One drink equals 12 ounces of beer, 5 ounces of wine, or 1 ounces of hard liquor.  Do not use street drugs.  Do not share needles.  Ask your health care provider for help if  you need support or information about quitting drugs.  Tell your health care provider if you often feel depressed.  Tell your health care provider if you have ever been abused or do not feel safe at home. This information is not intended to replace advice given to you by your health care provider. Make sure you discuss any questions you have with your health care provider. Document Released: 11/03/2007 Document Revised: 01/04/2016 Document Reviewed: 02/08/2015 Elsevier Interactive Patient Education  [employer].

## 2018-02-16 ENCOUNTER — Other Ambulatory Visit: Payer: Self-pay | Admitting: Internal Medicine

## 2018-02-26 ENCOUNTER — Other Ambulatory Visit: Payer: Self-pay | Admitting: Internal Medicine

## 2018-02-27 ENCOUNTER — Other Ambulatory Visit: Payer: Self-pay

## 2018-02-27 MED ORDER — SACUBITRIL-VALSARTAN 24-26 MG PO TABS: 1.0000 | ORAL_TABLET | Freq: Two times a day (BID) | ORAL | 0 refills | Status: DC

## 2018-02-27 NOTE — Telephone Encounter (Signed)
*  STAT* If patient is at the pharmacy, call can be transferred to refill team.   1. Which medications need to be refilled? (please list name of each medication and dose if known) Entresto  2. Which pharmacy/location (including street and city if local pharmacy) is medication to be sent to? [place]  3. Do they need a 30 day or 90 day supply? [place]

## 2018-03-04 ENCOUNTER — Encounter: Payer: Self-pay | Admitting: Family Medicine

## 2018-03-14 LAB — CUP PACEART REMOTE DEVICE CHECK
Date Time Interrogation Session: 20191025123731
Implantable Lead Implant Date: 20080214
Implantable Lead Model: 185
Implantable Lead Serial Number: 178017
MDC IDC LEAD LOCATION: 753860
MDC IDC PG IMPLANT DT: 20141216
Pulse Gen Serial Number: 126781

## 2018-03-20 ENCOUNTER — Ambulatory Visit: Payer: Self-pay | Admitting: Family Medicine

## 2018-03-28 ENCOUNTER — Ambulatory Visit: Payer: 59 | Admitting: Family Medicine

## 2018-03-28 ENCOUNTER — Encounter: Payer: Self-pay | Admitting: Family Medicine

## 2018-03-28 VITALS — BP 112/70 | HR 79 | Temp 98.2°F | Ht 70.0 in | Wt 207.9 lb

## 2018-03-28 DIAGNOSIS — G5792 Unspecified mononeuropathy of left lower limb: Secondary | ICD-10-CM | POA: Diagnosis not present

## 2018-03-28 NOTE — Patient Instructions (Addendum)
Let's try gentle heat topically over the area a few times a day for 5-7 days If recurring, then let me know and we'll get further tests OR I can refer you to a neurologist  Try quinoa (keen-wah)

## 2018-03-28 NOTE — Progress Notes (Signed)
BP 112/70   Pulse 79   Temp 98.2 F (36.8 C)   Ht 5\' 10"  (1.778 m)   Wt 207 lb 14.4 oz (94.3 kg)   SpO2 99%   BMI 29.83 kg/m    Subjective:    Patient ID: [name], male    DOB: 1975-11-20, 42 y.o.   MRN: [id number]  HPI: [name] is a 42 y.o. male  Chief Complaint  Patient presents with  . Ankle discomfort    Burning in left ankle. Pt states it started around the time he received his flu shot on 02/13/18.     HPI Patient is here for left ankle problem He has "burning" in his left ankle; intermittent This started around the time he received his flu shot, Sept 26, 2019 No weakness of the legs No dry mouth No injury No heat or ice  He has lost weight by giving up meat  Depression screen [facility] 2/9 03/28/2018 02/13/2018 02/13/2018 05/09/2017 12/25/2016  Decreased Interest 0 0 0 0 0  Down, Depressed, Hopeless 0 0 0 0 0  PHQ - 2 Score 0 0 0 0 0  Altered sleeping 0 1 - - -  Tired, decreased energy 0 1 - - -  Change in appetite 0 0 - - -  Feeling bad or failure about yourself  0 0 - - -  Trouble concentrating 0 0 - - -  Moving slowly or fidgety/restless 0 0 - - -  Suicidal thoughts 0 0 - - -  PHQ-9 Score 0 2 - - -  Difficult doing work/chores Not difficult at all Somewhat difficult - - -   Fall Risk  03/28/2018 05/09/2017 12/25/2016 11/28/2016 05/29/2016  Falls in the past year? 0 No No No No    Relevant past medical, surgical, family and social history reviewed Past Medical History:  Diagnosis Date  . Anxiety   . Aortic valve replaced    Requiring replacement, specifics not available  . Hearing loss of both ears 12/22/2015  . Heart murmur   . Hypertension   . IBD (inflammatory bowel disease) 09/23/2015  . ICD (implantable cardiac defibrillator), BSX single   . Obesity 12/22/2015  . Secondary cardiomyopathy ([place])   . Syncope   . Ventricular septal defect   . Ventricular tachycardia ([place])    appropriate VT shock therapy /13   Past Surgical History:  Procedure  Laterality Date  . ANGIOPLASTY     RCA repair with vein angioplasty following injury with the aforementioned surgery  . AORTIC VALVE REPLACEMENT     Bioprosthesis  . CARDIAC DEFIBRILLATOR PLACEMENT    . CARDIAC VALVE REPLACEMENT    . CHOLECYSTECTOMY    . COLONOSCOPY WITH PROPOFOL N/A 12/06/2015   Procedure: COLONOSCOPY WITH PROPOFOL;  Surgeon: [name], MD;  Location: [facility] ENDOSCOPY;  Service: Endoscopy;  Laterality: N/A;  . CORONARY ARTERY BYPASS GRAFT    . IMPLANTABLE CARDIOVERTER DEFIBRILLATOR GENERATOR CHANGE N/A 05/05/2013   Procedure: IMPLANTABLE CARDIOVERTER DEFIBRILLATOR GENERATOR CHANGE;  Surgeon: Deboraha Sprang, MD;  Location: [facility] CATH LAB;  Service: Cardiovascular;  Laterality: N/A;  . INSERT / REPLACE / REMOVE PACEMAKER    . VSD REPAIR     Family History  Problem Relation Age of Onset  . Heart disease Father   . Hypertension Maternal Grandmother   . Cancer Maternal Grandmother   . Hypertension Maternal Grandfather   . Cancer Maternal Grandfather    Social History   Tobacco Use  . Smoking  status: Never Smoker  . Smokeless tobacco: Never Used  Substance Use Topics  . Alcohol use: No  . Drug use: No     Office Visit from 03/28/2018 in [facility]  AUDIT-C Score  0      Interim medical history since last visit reviewed. Allergies and medications reviewed  Review of Systems Per HPI unless specifically indicated above     Objective:    BP 112/70   Pulse 79   Temp 98.2 F (36.8 C)   Ht 5\' 10"  (1.778 m)   Wt 207 lb 14.4 oz (94.3 kg)   SpO2 99%   BMI 29.83 kg/m   Wt Readings from Last 3 Encounters:  04/11/18 202 lb (91.6 kg)  03/28/18 207 lb 14.4 oz (94.3 kg)  02/13/18 214 lb 6.4 oz (97.3 kg)    Physical Exam  Constitutional: He appears well-developed and well-nourished. No distress.  Eyes: No scleral icterus.  Cardiovascular: Normal rate.  Pulmonary/Chest: Effort normal.  Musculoskeletal:       Right ankle: He exhibits normal  range of motion, no swelling and no deformity.       Left ankle: He exhibits normal range of motion, no swelling and no deformity.  Neurological: He is alert.  Discrete area of involvement around lateral malleolus; no swelling, no temp change; normal gait  Skin:  No erythema, no vesicles; nothing to suggest shingles or cellulitis  Psychiatric: He has a normal mood and affect.      Assessment & Plan:   Problem List Items Addressed This Visit    None    Visit Diagnoses    Mononeuritis leg, left    -  Primary   believe this may be a solitary inflamed or irritated nerve; showed in anatomy book area of involvement; let me know if worsening / not resolving       Follow up plan: No follow-ups on file.  An after-visit summary was printed and given to the patient at [facility].  Please see the patient instructions which may contain other information and recommendations beyond what is mentioned above in the assessment and plan.  No orders of the defined types were placed in this encounter.   No orders of the defined types were placed in this encounter.

## 2018-04-11 ENCOUNTER — Emergency Department
Admission: EM | Admit: 2018-04-11 | Discharge: 2018-04-12 | Disposition: A | Discharge status: Home / Self Care | Payer: 59 | Attending: Emergency Medicine | Admitting: Emergency Medicine

## 2018-04-11 ENCOUNTER — Other Ambulatory Visit: Payer: Self-pay

## 2018-04-11 DIAGNOSIS — Z951 Presence of aortocoronary bypass graft: Secondary | ICD-10-CM | POA: Diagnosis not present

## 2018-04-11 DIAGNOSIS — Z950 Presence of cardiac pacemaker: Secondary | ICD-10-CM | POA: Insufficient documentation

## 2018-04-11 DIAGNOSIS — I10 Essential (primary) hypertension: Secondary | ICD-10-CM | POA: Insufficient documentation

## 2018-04-11 DIAGNOSIS — Z79899 Other long term (current) drug therapy: Secondary | ICD-10-CM | POA: Diagnosis not present

## 2018-04-11 DIAGNOSIS — Z952 Presence of prosthetic heart valve: Secondary | ICD-10-CM | POA: Diagnosis not present

## 2018-04-11 DIAGNOSIS — R1030 Lower abdominal pain, unspecified: Secondary | ICD-10-CM | POA: Diagnosis present

## 2018-04-11 DIAGNOSIS — R197 Diarrhea, unspecified: Secondary | ICD-10-CM | POA: Diagnosis not present

## 2018-04-11 DIAGNOSIS — R112 Nausea with vomiting, unspecified: Secondary | ICD-10-CM | POA: Insufficient documentation

## 2018-04-11 LAB — COMPREHENSIVE METABOLIC PANEL
ALT: 23 U/L (ref 0–44)
ANION GAP: 10 (ref 5–15)
AST: 22 U/L (ref 15–41)
Albumin: 4.2 g/dL (ref 3.5–5.0)
Alkaline Phosphatase: 29 U/L — ABNORMAL LOW (ref 38–126)
BUN: 13 mg/dL (ref 6–20)
CHLORIDE: 105 mmol/L (ref 98–111)
CO2: 25 mmol/L (ref 22–32)
Calcium: 9.2 mg/dL (ref 8.9–10.3)
Creatinine, Ser: 1.14 mg/dL (ref 0.61–1.24)
GFR calc non Af Amer: >60 mL/min (ref >60)
Glucose, Bld: 106 mg/dL — ABNORMAL HIGH (ref 70–99)
POTASSIUM: 3.8 mmol/L (ref 3.5–5.1)
SODIUM: 140 mmol/L (ref 135–145)
Total Bilirubin: 1.1 mg/dL (ref 0.3–1.2)
Total Protein: 7.2 g/dL (ref 6.5–8.1)

## 2018-04-11 LAB — CBC WITH DIFFERENTIAL/PLATELET
Abs Immature Granulocytes: 0.02 10*3/uL (ref 0.00–0.07)
BASOS ABS: 0 10*3/uL (ref 0.0–0.1)
Basophils Relative: 0 %
Eosinophils Absolute: 0.1 10*3/uL (ref 0.0–0.5)
Eosinophils Relative: 1 %
HEMATOCRIT: 40 % (ref 39.0–52.0)
HEMOGLOBIN: 13 g/dL (ref 13.0–17.0)
IMMATURE GRANULOCYTES: 1 %
LYMPHS ABS: 0.8 10*3/uL (ref 0.7–4.0)
LYMPHS PCT: 21 %
MCH: 28.8 pg (ref 26.0–34.0)
MCHC: 32.5 g/dL (ref 30.0–36.0)
MCV: 88.5 fL (ref 80.0–100.0)
Monocytes Absolute: 0.3 10*3/uL (ref 0.1–1.0)
Monocytes Relative: 8 %
NEUTROS ABS: 2.7 10*3/uL (ref 1.7–7.7)
NEUTROS PCT: 69 %
NRBC: 0 % (ref 0.0–0.2)
Platelets: 179 10*3/uL (ref 150–400)
RBC: 4.52 MIL/uL (ref 4.22–5.81)
RDW: 11.7 % (ref 11.5–15.5)
WBC: 3.9 10*3/uL — AB (ref 4.0–10.5)

## 2018-04-11 LAB — TROPONIN I

## 2018-04-11 MED ORDER — ONDANSETRON HCL 4 MG/2ML IJ SOLN
4.0000 mg | Freq: Once | INTRAMUSCULAR | Status: AC
  Administered 2018-04-11: 4 mg via INTRAVENOUS
  Filled 2018-04-11: qty 2

## 2018-04-11 MED ORDER — SODIUM CHLORIDE 0.9 % IV BOLUS
1000.0000 mL | Freq: Once | INTRAVENOUS | Status: AC
  Administered 2018-04-11: 1000 mL via INTRAVENOUS

## 2018-04-11 NOTE — ED Triage Notes (Signed)
Patient with onset yesterday of lower stomach cramping and diarrhea. It resolved for a bit then returned today. States he has taken two doses of promethazine without relief. Able to take fluids in the form of water and gatorade. Patient is alert and oriented at this time, skin warm/dry.

## 2018-04-11 NOTE — ED Provider Notes (Signed)
[facility] Emergency Department Provider Note   ____________________________________________   First MD Initiated Contact with Patient 04/11/18 2158     (approximate)  I have reviewed the triage vital signs and the nursing notes.   HISTORY  Chief Complaint Diarrhea and Nausea    HPI [name] is a 42 y.o. male patient reports he has lower abdominal pain that was crampy with diarrhea yesterday got better than came back today.  He has had about 7 or 8 stools today.  He had not vomited until he came to the ER and after I got done examining his abdomen he began vomiting vomited 3 or 4 times.  He is not having any chest pain.  He does not have any shortness of breath or abdominal tenderness to palpation.   Past Medical History:  Diagnosis Date  . Anxiety   . Aortic valve replaced    Requiring replacement, specifics not available  . Hearing loss of both ears 12/22/2015  . Heart murmur   . Hypertension   . IBD (inflammatory bowel disease) 09/23/2015  . ICD (implantable cardiac defibrillator), BSX single   . Obesity 12/22/2015  . Secondary cardiomyopathy ([place])   . Syncope   . Ventricular septal defect   . Ventricular tachycardia ([place])    appropriate VT shock therapy /13    Patient Active Problem List   Diagnosis Date Noted  . Epigastric pain 05/09/2017  . Lipoma of buttock 01/11/2017  . ED (erectile dysfunction) 12/01/2016  . Syncope 05/16/2016  . Sustained ventricular tachycardia ([place]) 05/16/2016  . Ventricular tachycardia ([place]) 05/16/2016  . Prostate cancer screening 12/22/2015  . Hearing loss of both ears 12/22/2015  . Abnormal findings-gastrointestinal tract   . IBD (inflammatory bowel disease) 09/23/2015  . Pain in lower back 09/15/2015  . Night sweats 09/15/2015  . Groin pain 07/29/2015  . Screening for STD (sexually transmitted disease) 07/29/2015  . Preventative health care 05/10/2015  . Presence of cardiac defibrillator 05/10/2015    . Hypertension goal BP (blood pressure) < 140/90 12/13/2011  . H/O aortic valve replacement 08/08/2009  . Cardiomyopathy, secondary ([place]) 03/09/2009  . History of ventricular tachycardia 03/09/2009    Past Surgical History:  Procedure Laterality Date  . ANGIOPLASTY     RCA repair with vein angioplasty following injury with the aforementioned surgery  . AORTIC VALVE REPLACEMENT     Bioprosthesis  . CARDIAC DEFIBRILLATOR PLACEMENT    . CARDIAC VALVE REPLACEMENT    . CHOLECYSTECTOMY    . COLONOSCOPY WITH PROPOFOL N/A 12/06/2015   Procedure: COLONOSCOPY WITH PROPOFOL;  Surgeon: [name], MD;  Location: [facility] ENDOSCOPY;  Service: Endoscopy;  Laterality: N/A;  . CORONARY ARTERY BYPASS GRAFT    . IMPLANTABLE CARDIOVERTER DEFIBRILLATOR GENERATOR CHANGE N/A 05/05/2013   Procedure: IMPLANTABLE CARDIOVERTER DEFIBRILLATOR GENERATOR CHANGE;  Surgeon: Deboraha Sprang, MD;  Location: [facility] CATH LAB;  Service: Cardiovascular;  Laterality: N/A;  . INSERT / REPLACE / REMOVE PACEMAKER    . VSD REPAIR      Prior to Admission medications   Medication Sig Start Date End Date Taking? Authorizing Provider  ATROVENT HFA 17 MCG/ACT inhaler Inhale 2 puffs into the lungs 4 (four) times daily as needed. 04/09/16   [provider]  carvedilol (COREG) 12.5 MG tablet Take 1 tablet (12.5 mg total) by mouth 2 (two) times daily. 01/21/18   Deboraha Sprang, MD  fluticasone (FLONASE) 50 MCG/ACT nasal spray Place 2 sprays into both nostrils daily. Patient taking differently:  Place 2 sprays into both nostrils as needed.  08/17/15   Steele Sizer, MD  loratadine (CLARITIN) 5 MG chewable tablet Chew 5 mg by mouth daily as needed for allergies.    [provider]  magnesium oxide (MAG-OX) 400 (241.3 Mg) MG tablet Take 1 tablet (400 mg total) by mouth 2 (two) times daily. 05/17/16   Loletha Grayer, MD  Multiple Vitamin (MULTIVITAMIN) tablet Take 1 tablet by mouth daily.    [provider]   promethazine (PHENERGAN) 25 MG tablet Take 0.5-1 tablets (12.5-25 mg total) by mouth every 6 (six) hours as needed for nausea or vomiting. 10/30/17   Lada, Satira Anis, MD  ranolazine (RANEXA) 500 MG 12 hr tablet TAKE 1 TABLET(500 MG) BY MOUTH TWICE DAILY 02/13/18   Gollan, Kathlene November, MD  sacubitril-valsartan (ENTRESTO) 24-26 MG Take 1 tablet by mouth 2 (two) times daily. PLEASE CALL THE OFFICE TO SCHEDULE YOUR FOLLOW UP APPT WITH DR. Caryl Comes 02/27/18   Deboraha Sprang, MD  sildenafil (REVATIO) 20 MG tablet Take 2-3 tablets (40-60 mg total) by mouth daily as needed. Never combine with nitrates 01/17/18   Fredderick Severance, NP  spironolactone (ALDACTONE) 25 MG tablet TAKE 1/2 TABLET BY MOUTH DAILY 12/30/17   Deboraha Sprang, MD    Allergies Shellfish allergy  Family History  Problem Relation Age of Onset  . Heart disease Father   . Hypertension Maternal Grandmother   . Cancer Maternal Grandmother   . Hypertension Maternal Grandfather   . Cancer Maternal Grandfather     Social History Social History   Tobacco Use  . Smoking status: Never Smoker  . Smokeless tobacco: Never Used  Substance Use Topics  . Alcohol use: No  . Drug use: No    Review of Systems  Constitutional: No fever/chills Eyes: No visual changes. ENT: No sore throat. Cardiovascular: Denies chest pain. Respiratory: Denies shortness of breath. Gastrointestinal: Crampy abdominal pain.   nausea,  vomiting.   diarrhea.  No constipation. Genitourinary: Negative for dysuria. Musculoskeletal: Negative for back pain. Skin: Negative for rash. Neurological: Negative for headaches, focal weakness   ____________________________________________   PHYSICAL EXAM:  VITAL SIGNS: ED Triage Vitals  Enc Vitals Group     BP 04/11/18 2148 125/72     Pulse Rate 04/11/18 2148 74     Resp 04/11/18 2148 18     Temp 04/11/18 2148 98.4 F (36.9 C)     Temp Source 04/11/18 2148 Oral     SpO2 04/11/18 2148 100 %     Weight  04/11/18 2150 202 lb (91.6 kg)     Height 04/11/18 2150 5\' 10"  (1.778 m)     Head Circumference --      Peak Flow --      Pain Score 04/11/18 2150 10     Pain Loc --      Pain Edu? --      Excl. in [place]? --     Constitutional: Alert and oriented.  Looks tired and worn out Eyes: Conjunctivae are normal. Head: Atraumatic. Nose: No congestion/rhinnorhea. Mouth/Throat: Mucous membranes are moist.  Oropharynx non-erythematous. Neck: No stridor. Cardiovascular: Normal rate, regular rhythm. Grossly normal heart sounds.  Good peripheral circulation. Respiratory: Normal respiratory effort.  No retractions. Lungs CTAB. Gastrointestinal: Soft and nontender. No distention. No abdominal bruits. No CVA tenderness. Musculoskeletal: No lower extremity tenderness nor edema.   Neurologic:  Normal speech and language. No gross focal neurologic deficits are appreciated.  Skin:  Skin is warm,  dry and intact. No rash noted. Psychiatric: Mood and affect are normal. Speech and behavior are normal.  ____________________________________________   LABS (all labs ordered are listed, but only abnormal results are displayed)  Labs Reviewed  COMPREHENSIVE METABOLIC PANEL - Abnormal; Notable for the following components:      Result Value   Glucose, Bld 106 (*)    Alkaline Phosphatase 29 (*)    All other components within normal limits  CBC WITH DIFFERENTIAL/PLATELET - Abnormal; Notable for the following components:   WBC 3.9 (*)    All other components within normal limits  C DIFFICILE QUICK SCREEN W PCR REFLEX  GASTROINTESTINAL PANEL BY PCR, STOOL (REPLACES STOOL CULTURE)  TROPONIN I   ____________________________________________  EKG  EKG read and interpreted by me shows normal sinus rhythm rate of 71 normal axis left bundle branch block no acute ST-T changes really no change from 25 June of this year ____________________________________________  RADIOLOGY  ED MD interpretation:    Official  radiology report(s): No results found.  ____________________________________________   PROCEDURES  Procedure(s) performed:   Procedures  Critical Care performed:   ____________________________________________   INITIAL IMPRESSION / ASSESSMENT AND PLAN / ED COURSE  Waiting for stool PCR to come back.  Dr. Mable Paris will follow up.  Patient feels better.  Will decide on either antibiotics and Zofran or Pepto and Zofran and we get the stool back         ____________________________________________   FINAL CLINICAL IMPRESSION(S) / ED DIAGNOSES  Final diagnoses:  Nausea vomiting and diarrhea     ED Discharge Orders    None       Note:  This document was prepared using Dragon voice recognition software and may include unintentional dictation errors.    Nena Polio, MD 04/12/18 0111

## 2018-04-12 LAB — GASTROINTESTINAL PANEL BY PCR, STOOL (REPLACES STOOL CULTURE)
ASTROVIRUS: NOT DETECTED
Adenovirus F40/41: NOT DETECTED
CAMPYLOBACTER SPECIES: NOT DETECTED
CRYPTOSPORIDIUM: NOT DETECTED
CYCLOSPORA CAYETANENSIS: NOT DETECTED
ENTAMOEBA HISTOLYTICA: NOT DETECTED
ENTEROPATHOGENIC E COLI (EPEC): NOT DETECTED
ENTEROTOXIGENIC E COLI (ETEC): NOT DETECTED
Enteroaggregative E coli (EAEC): NOT DETECTED
Giardia lamblia: NOT DETECTED
Norovirus GI/GII: NOT DETECTED
PLESIMONAS SHIGELLOIDES: NOT DETECTED
Rotavirus A: NOT DETECTED
Salmonella species: NOT DETECTED
Sapovirus (I, II, IV, and V): NOT DETECTED
Shiga like toxin producing E coli (STEC): NOT DETECTED
Shigella/Enteroinvasive E coli (EIEC): NOT DETECTED
VIBRIO CHOLERAE: NOT DETECTED
VIBRIO SPECIES: NOT DETECTED
YERSINIA ENTEROCOLITICA: NOT DETECTED

## 2018-04-12 LAB — C DIFFICILE QUICK SCREEN W PCR REFLEX
C DIFFICLE (CDIFF) ANTIGEN: NEGATIVE
C Diff interpretation: NOT DETECTED
C Diff toxin: NEGATIVE

## 2018-04-12 MED ORDER — ONDANSETRON 4 MG PO TBDP: 4.0000 mg | ORAL_TABLET | Freq: Three times a day (TID) | ORAL | 0 refills | Status: DC | PRN

## 2018-04-12 MED ORDER — SODIUM CHLORIDE 0.9 % IV BOLUS
1000.0000 mL | Freq: Once | INTRAVENOUS | Status: AC
  Administered 2018-04-12: 1000 mL via INTRAVENOUS

## 2018-04-12 NOTE — ED Notes (Signed)
Pt in CT.

## 2018-04-12 NOTE — Discharge Instructions (Signed)
Fortunately today your stool studies showed you do not have an infection in your diarrhea.  Please take Zofran as needed for nausea and purchase over-the-counter Imodium (loperamide) and take up to 6 tablets a day as needed for diarrhea.  Follow-up with your primary care physician this coming Monday for recheck.  Return to the emergency department sooner for any concerns.  It was a pleasure to take care of you today, and thank you for coming to our emergency department.  If you have any questions or concerns before leaving please ask the nurse to grab me and I'm more than happy to go through your aftercare instructions again.  If you have any concerns once you are home that you are not improving or are in fact getting worse before you can make it to your follow-up appointment, please do not hesitate to call 911 and come back for further evaluation.  Darel Hong, MD  Results for orders placed or performed during the hospital encounter of 04/11/18  Gastrointestinal Panel by PCR , Stool  Result Value Ref Range   Campylobacter species NOT DETECTED NOT DETECTED   Plesimonas shigelloides NOT DETECTED NOT DETECTED   Salmonella species NOT DETECTED NOT DETECTED   Yersinia enterocolitica NOT DETECTED NOT DETECTED   Vibrio species NOT DETECTED NOT DETECTED   Vibrio cholerae NOT DETECTED NOT DETECTED   Enteroaggregative E coli (EAEC) NOT DETECTED NOT DETECTED   Enteropathogenic E coli (EPEC) NOT DETECTED NOT DETECTED   Enterotoxigenic E coli (ETEC) NOT DETECTED NOT DETECTED   Shiga like toxin producing E coli (STEC) NOT DETECTED NOT DETECTED   Shigella/Enteroinvasive E coli (EIEC) NOT DETECTED NOT DETECTED   Cryptosporidium NOT DETECTED NOT DETECTED   Cyclospora cayetanensis NOT DETECTED NOT DETECTED   Entamoeba histolytica NOT DETECTED NOT DETECTED   Giardia lamblia NOT DETECTED NOT DETECTED   Adenovirus F40/41 NOT DETECTED NOT DETECTED   Astrovirus NOT DETECTED NOT DETECTED   Norovirus GI/GII  NOT DETECTED NOT DETECTED   Rotavirus A NOT DETECTED NOT DETECTED   Sapovirus (I, II, IV, and V) NOT DETECTED NOT DETECTED  C difficile quick scan w PCR reflex  Result Value Ref Range   C Diff antigen NEGATIVE NEGATIVE   C Diff toxin NEGATIVE NEGATIVE   C Diff interpretation No C. difficile detected.   Comprehensive metabolic panel  Result Value Ref Range   Sodium 140 135 - 145 mmol/L   Potassium 3.8 3.5 - 5.1 mmol/L   Chloride 105 98 - 111 mmol/L   CO2 25 22 - 32 mmol/L   Glucose, Bld 106 (H) 70 - 99 mg/dL   BUN 13 6 - 20 mg/dL   Creatinine, Ser 1.14 0.61 - 1.24 mg/dL   Calcium 9.2 8.9 - 10.3 mg/dL   Total Protein 7.2 6.5 - 8.1 g/dL   Albumin 4.2 3.5 - 5.0 g/dL   AST 22 15 - 41 U/L   ALT 23 0 - 44 U/L   Alkaline Phosphatase 29 (L) 38 - 126 U/L   Total Bilirubin 1.1 0.3 - 1.2 mg/dL   GFR calc non Af Amer >60 >60 mL/min   GFR calc Af Amer >60 >60 mL/min   Anion gap 10 5 - 15  CBC with Differential  Result Value Ref Range   WBC 3.9 (L) 4.0 - 10.5 K/uL   RBC 4.52 4.22 - 5.81 MIL/uL   Hemoglobin 13.0 13.0 - 17.0 g/dL   HCT 40.0 39.0 - 52.0 %   MCV 88.5 80.0 - 100.0  fL   MCH 28.8 26.0 - 34.0 pg   MCHC 32.5 30.0 - 36.0 g/dL   RDW 11.7 11.5 - 15.5 %   Platelets 179 150 - 400 K/uL   nRBC 0.0 0.0 - 0.2 %   Neutrophils Relative % 69 %   Neutro Abs 2.7 1.7 - 7.7 K/uL   Lymphocytes Relative 21 %   Lymphs Abs 0.8 0.7 - 4.0 K/uL   Monocytes Relative 8 %   Monocytes Absolute 0.3 0.1 - 1.0 K/uL   Eosinophils Relative 1 %   Eosinophils Absolute 0.1 0.0 - 0.5 K/uL   Basophils Relative 0 %   Basophils Absolute 0.0 0.0 - 0.1 K/uL   Immature Granulocytes 1 %   Abs Immature Granulocytes 0.02 0.00 - 0.07 K/uL  Troponin I - Once  Result Value Ref Range   Troponin I <0.03 <0.03 ng/mL

## 2018-04-12 NOTE — ED Notes (Signed)
Fluids continue to infuse. No more emesis noted. Bed readjusted for comfort. Repeat troponin drawn and sent.

## 2018-04-12 NOTE — ED Notes (Signed)
Pt given ice chips to tolerate. 

## 2018-04-12 NOTE — ED Provider Notes (Signed)
Care signed over from Dr. Rip Harbour pending results of stool studies.  Stool studies are negative.  Will be discharged home with loperamide and Zofran.  2-day recheck.   Darel Hong, MD 04/12/18 [phone]

## 2018-05-02 ENCOUNTER — Other Ambulatory Visit: Payer: Self-pay | Admitting: Internal Medicine

## 2018-05-15 ENCOUNTER — Ambulatory Visit (INDEPENDENT_AMBULATORY_CARE_PROVIDER_SITE_OTHER): Payer: 59

## 2018-05-15 DIAGNOSIS — I428 Other cardiomyopathies: Secondary | ICD-10-CM

## 2018-05-15 NOTE — Progress Notes (Signed)
Remote ICD transmission.   

## 2018-05-18 LAB — CUP PACEART REMOTE DEVICE CHECK
Date Time Interrogation Session: 20191229172947
Implantable Lead Implant Date: 20080214
Implantable Lead Location: 753860
Implantable Lead Model: 185
Implantable Lead Serial Number: 178017
MDC IDC PG IMPLANT DT: 20141216
Pulse Gen Serial Number: 126781

## 2018-05-20 ENCOUNTER — Telehealth: Payer: Self-pay | Admitting: Internal Medicine

## 2018-05-20 NOTE — Telephone Encounter (Signed)
*  STAT* If patient is at the pharmacy, call can be transferred to refill team.   1. Which medications need to be refilled? (please list name of each medication and dose if known)carvedilol (COREG) 12.5 MG tablet  2. Which pharmacy/location (including street and city if local pharmacy) is medication to be sent to[facility]  3. Do they need a 30 day or 90 day supply? [place]

## 2018-05-22 ENCOUNTER — Other Ambulatory Visit: Payer: Self-pay

## 2018-05-22 MED ORDER — CARVEDILOL 12.5 MG PO TABS: 12.5000 mg | ORAL_TABLET | Freq: Two times a day (BID) | ORAL | 1 refills | Status: DC

## 2018-05-25 ENCOUNTER — Other Ambulatory Visit: Payer: Self-pay | Admitting: Internal Medicine

## 2018-05-26 NOTE — Telephone Encounter (Signed)
This is a [place] pt 

## 2018-05-30 ENCOUNTER — Other Ambulatory Visit: Payer: Self-pay | Admitting: Internal Medicine

## 2018-05-30 NOTE — Telephone Encounter (Signed)
This is a [place] pt 

## 2018-06-12 ENCOUNTER — Telehealth: Payer: Self-pay | Admitting: Family Medicine

## 2018-06-12 NOTE — Telephone Encounter (Unsigned)
Copied from [place] [id number]. Topic: Complaint - Billing/Coding >> Jun 12, 2018  3:38 PM Yvette Rack wrote: DOS: 02/13/18 Details of complaint: Pt stated that he continues to get a bill for the appt for his physical. Pt stated the bill was never filed through his insurance and it should have. Pt stated he has contacted the billing services dept but he is not getting any resolution as he still continues to get a bill for the visit.   How would the patient like to see this issue resolved? Pt would like the bill to filed with his insurance   Will automatically be routed to [employer].

## 2018-06-19 ENCOUNTER — Ambulatory Visit: Payer: 59 | Admitting: Nurse Practitioner

## 2018-06-19 ENCOUNTER — Encounter: Payer: Self-pay | Admitting: Nurse Practitioner

## 2018-06-19 VITALS — BP 110/66 | HR 80 | Temp 98.4°F | Resp 18 | Ht 70.0 in | Wt 207.0 lb

## 2018-06-19 DIAGNOSIS — S161XXA Strain of muscle, fascia and tendon at neck level, initial encounter: Secondary | ICD-10-CM | POA: Diagnosis not present

## 2018-06-19 DIAGNOSIS — S134XXA Sprain of ligaments of cervical spine, initial encounter: Secondary | ICD-10-CM

## 2018-06-19 DIAGNOSIS — M545 Low back pain, unspecified: Secondary | ICD-10-CM

## 2018-06-19 DIAGNOSIS — M62838 Other muscle spasm: Secondary | ICD-10-CM

## 2018-06-19 MED ORDER — TIZANIDINE HCL 4 MG PO CAPS: ORAL_CAPSULE | ORAL | 0 refills | Status: DC
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
## 2018-06-22 ENCOUNTER — Other Ambulatory Visit: Payer: Self-pay | Admitting: Internal Medicine

## 2018-06-24 ENCOUNTER — Encounter: Payer: Self-pay | Admitting: Internal Medicine

## 2018-06-24 ENCOUNTER — Telehealth: Payer: Self-pay | Admitting: *Deleted

## 2018-06-24 ENCOUNTER — Ambulatory Visit: Payer: 59 | Admitting: Internal Medicine

## 2018-06-24 VITALS — BP 144/82 | HR 72 | Ht 70.0 in | Wt 205.0 lb

## 2018-06-24 DIAGNOSIS — I428 Other cardiomyopathies: Secondary | ICD-10-CM | POA: Diagnosis not present

## 2018-06-24 DIAGNOSIS — Z95810 Presence of automatic (implantable) cardiac defibrillator: Secondary | ICD-10-CM

## 2018-06-24 DIAGNOSIS — I493 Ventricular premature depolarization: Secondary | ICD-10-CM

## 2018-06-24 DIAGNOSIS — I472 Ventricular tachycardia, unspecified: Secondary | ICD-10-CM

## 2018-06-24 NOTE — Patient Instructions (Signed)
Medication Instructions:  - Your physician recommends that you continue on your current medications as directed. Please refer to the Current Medication list given to you today.  If you need a refill on your cardiac medications before your next appointment, please call your pharmacy.   Lab work: - Your physician recommends that you have lab work today: BMP/ Magnesium  If you have labs (blood work) drawn today and your tests are completely normal, you will receive your results only by: Marland Kitchen MyChart Message (if you have MyChart) OR . A paper copy in the mail If you have any lab test that is abnormal or we need to change your treatment, we will call you to review the results.  Testing/Procedures: - Your physician has requested that you have an echocardiogram with [facility]. Echocardiography is a painless test that uses sound waves to create images of your heart. It provides your doctor with information about the size and shape of your heart and how well your heart's chambers and valves are working. This procedure takes approximately one hour. There are no restrictions for this procedure.   Follow-Up: At [facility], you and your health needs are our priority.  As part of our continuing mission to provide you with exceptional heart care, we have created designated Provider Care Teams.  These Care Teams include your primary Cardiologist (physician) and Advanced Practice Providers (APPs -  Physician Assistants and Nurse Practitioners) who all work together to provide you with the care you need, when you need it. You will need a follow up appointment in 1 year with Dr. Caryl Comes.Marland Kitchen  Please call our office 2 months in advance to schedule this appointment.    Any Other Special Instructions Will Be Listed Below (If Applicable). - N/A

## 2018-06-24 NOTE — Telephone Encounter (Signed)
Received alert for VT episode on 06/23/18 at 15:53, ATP x1 unsuccessful, V rate increased into VF zone, episode self-terminated while defib charging, shock diverted. Spoke with patient, who reports that he was symptomatic with episode, felt very dizzy, sat on the floor, then was able to make it to a chair to collect himself. Denies LOC, chest discomfort, ShOB, or other symptoms. Patient reports symptoms occurred just before 15:00, explained device time could be off due to time change.   Patient reports he may have missed a "cycle or two" of all of his medications in recent weeks. He reports his life is not as stable "as it should be" and he correlates this episode with an increase in red meat consumption. Offered appointment for overdue f/u with Dr. Caryl Comes today in [place] at 11:15am (confirmed availability with Nira Conn, RN). Patient is agreeable to this plan. Advised of [place] DMV driving restrictions x6 months, patient verbalizes understanding and denies additional questions or concerns at this time.

## 2018-06-24 NOTE — Progress Notes (Signed)
Patient Care Team: Lada, Satira Anis, MD as PCP - General (Family Medicine) Carloyn Manner, MD as Referring Physician (Otolaryngology) Bary Castilla Forest Gleason, MD (General Surgery) Sanda Tanyon Alipio Satira Anis, MD as Attending Physician (Family Medicine)   HPI  [name] is a 43 y.o. male Seen in followup for ventricular tachycardia occurring in the context of surgically corrected congenital heart disease With Aortic valve replacement.  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  My note from 2011 reports that at the time of surgery "he had damage to his right coronary artery and underwent repair." He is status post ICD implantation. 4/13 had appropriate shock for fast ventricular tachycardia He underwent device generator replacement 12/14    DATE TEST    3/13    Echo   EF 40-45 % IP akinesis   1/15    TEE   EF 60 % "mechanical aortic valve"  2/17 Echo 45-50%   12/17 Echo EF 35-40 "bioprosthesis "  5/17 Echo EF 30-35%   8/18 CT EF 30-35 RCA Total without flow//aneurysm Inferior Wall LAD & CX w FFR > 80%   12/17  Admitted [facility] for syncopal VT- PM  Antecedent VT PM in wake of viral illness  Ranexa and MAG added  He has had interval VTNS monomorphic and polymorphic   Antiarrhythmics Date Reason stopped  Ranolazine 12/17     Date Cr K  12/18 1.24 4.3   11/19 1.14 3.8      DATE TEST    6/18    HOLTER <1 % PVCs        He comes in today having done really very well for the last months without chest pains or shortness of breath.  He had no edema.  Yesterday he had the abrupt onset of presyncope prompting him to sit on the floor and about 15 seconds the episode had resolved.  Device interrogation demonstrates rapid monomorphic VT with acceleration with ATP and then termination spontaneously at the moment of charging being completed.  The shock was diverted.  No changes in  medications.  His diet has been different of late because they have lost their stove.  He denies changes in functional status peripheral edema abdominal distention    Past Medical History:  Diagnosis Date  . Anxiety   . Aortic valve replaced    Requiring replacement, specifics not available  . Hearing loss of both ears 12/22/2015  . Heart murmur   . Hypertension   . IBD (inflammatory bowel disease) 09/23/2015  . ICD (implantable cardiac defibrillator), BSX single   . Obesity 12/22/2015  . Secondary cardiomyopathy ([place])   . Syncope   . Ventricular septal defect   . Ventricular tachycardia ([place])    appropriate VT shock therapy /13    Past Surgical History:  Procedure Laterality Date  . ANGIOPLASTY     RCA repair with vein angioplasty following injury with the aforementioned surgery  . AORTIC VALVE REPLACEMENT     Bioprosthesis  . CARDIAC DEFIBRILLATOR PLACEMENT    . CARDIAC VALVE REPLACEMENT    . CHOLECYSTECTOMY    . COLONOSCOPY WITH PROPOFOL N/A 12/06/2015   Procedure: COLONOSCOPY WITH PROPOFOL;  Surgeon: [name], MD;  Location: [facility] ENDOSCOPY;  Service: Endoscopy;  Laterality: N/A;  . CORONARY ARTERY BYPASS GRAFT    . IMPLANTABLE CARDIOVERTER DEFIBRILLATOR GENERATOR CHANGE N/A 05/05/2013   Procedure: IMPLANTABLE CARDIOVERTER  DEFIBRILLATOR GENERATOR CHANGE;  Surgeon: Deboraha Sprang, MD;  Location: [facility] CATH LAB;  Service: Cardiovascular;  Laterality: N/A;  . INSERT / REPLACE / REMOVE PACEMAKER    . VSD REPAIR      Current Outpatient Medications  Medication Sig Dispense Refill  . ATROVENT HFA 17 MCG/ACT inhaler Inhale 2 puffs into the lungs 4 (four) times daily as needed.    . carvedilol (COREG) 12.5 MG tablet Take 1 tablet (12.5 mg total) by mouth 2 (two) times daily. 180 tablet 1  . ENTRESTO 24-26 MG TAKE 1 TABLET BY MOUTH TWICE DAILY 60 tablet 0  . fluticasone (FLONASE) 50 MCG/ACT nasal spray Place 2 sprays into both nostrils daily. (Patient taking differently: Place 2 sprays  into both nostrils as needed. ) 16 g 2  . loratadine (CLARITIN) 5 MG chewable tablet Chew 5 mg by mouth daily as needed for allergies.    . magnesium oxide (MAG-OX) 400 (241.3 Mg) MG tablet Take 1 tablet (400 mg total) by mouth 2 (two) times daily. 60 tablet 0  . Multiple Vitamin (MULTIVITAMIN) tablet Take 1 tablet by mouth daily.    . ondansetron (ZOFRAN ODT) 4 MG disintegrating tablet Take 1 tablet (4 mg total) by mouth every 8 (eight) hours as needed for nausea or vomiting. 20 tablet 0  . promethazine (PHENERGAN) 25 MG tablet Take 0.5-1 tablets (12.5-25 mg total) by mouth every 6 (six) hours as needed for nausea or vomiting. 20 tablet 0  . ranolazine (RANEXA) 500 MG 12 hr tablet TAKE 1 TABLET(500 MG) BY MOUTH TWICE DAILY 180 tablet 1  . sildenafil (REVATIO) 20 MG tablet Take 2-3 tablets (40-60 mg total) by mouth daily as needed. Never combine with nitrates 30 tablet 0  . spironolactone (ALDACTONE) 25 MG tablet TAKE 1/2 TABLET BY MOUTH EVERY DAY 15 tablet 1  . tiZANidine (ZANAFLEX) 4 MG capsule Take at bedtime, if it does not cause drowsiness you can take it up to every 8 hours. 30 capsule 0   No current facility-administered medications for this visit.     Allergies  Allergen Reactions  . Shellfish Allergy Nausea And Vomiting    Review of Systems negative except from HPI and PMH  Physical Exam BP (!) 144/82 (BP Location: Left Arm, Patient Position: Sitting, Cuff Size: Normal)   Pulse 72   Ht 5\' 10"  (1.778 m)   Wt 205 lb (93 kg)   BMI 29.41 kg/m   Well developed and nourished in no acute distress HENT normal Neck supple with JVP-flat Clear Regular rate and rhythm, no murmurs or gallops Abd-soft with active BS No Clubbing cyanosis edema Skin-warm and dry A & Oriented  Grossly normal sensory and motor function   ECG sinus rhythm at 72 Interval 17/12/40 LVH with re-pol   Assessment and plan  Ventricular tachycardia   Hypertension   Aortic valve-bioprosthesis    ICD-[employer]  The patient's device was interrogated.  The information was reviewed. No changes were made in the programming.     Cardiomyopathy-hypertrophic/ischemic-secondary  PVCs-infrequent  RCA No flow, presumed 2/2 surgery As above   Intercurrent ventricular tachycardia.  Have reviewed with him driving its restrictions.  Infrequency makes it difficult to be very aggressive with antiarrhythmic therapy either drugs or ablation.  I have suggested that we consider up titration of Entresto, carvedilol and/or ranolazine.  We will plan prior to drug changes to look at LV function and LV dimensions.  His last echo had systolic and diastolic dimensions of 49/67.  More than 50% of 40 min was spent in counseling related to the above

## 2018-06-25 LAB — BASIC METABOLIC PANEL
BUN / CREAT RATIO: 11 (ref 9–20)
BUN: 13 mg/dL (ref 6–24)
CO2: 24 mmol/L (ref 20–29)
CREATININE: 1.23 mg/dL (ref 0.76–1.27)
Calcium: 9.9 mg/dL (ref 8.7–10.2)
Chloride: 104 mmol/L (ref 96–106)
GFR calc non Af Amer: 71 mL/min/{1.73_m2} (ref >59)
GFR, EST AFRICAN AMERICAN: 82 mL/min/{1.73_m2} (ref >59)
Glucose: 88 mg/dL (ref 65–99)
Potassium: 4.6 mmol/L (ref 3.5–5.2)
Sodium: 141 mmol/L (ref 134–144)

## 2018-06-25 LAB — MAGNESIUM: Magnesium: 2.2 mg/dL (ref 1.6–2.3)

## 2018-07-01 ENCOUNTER — Ambulatory Visit (INDEPENDENT_AMBULATORY_CARE_PROVIDER_SITE_OTHER): Payer: 59

## 2018-07-01 ENCOUNTER — Other Ambulatory Visit: Payer: Self-pay | Admitting: Family Medicine

## 2018-07-01 ENCOUNTER — Other Ambulatory Visit: Payer: Self-pay

## 2018-07-01 DIAGNOSIS — I472 Ventricular tachycardia, unspecified: Secondary | ICD-10-CM

## 2018-07-01 MED ORDER — PERFLUTREN LIPID MICROSPHERE
1.0000 mL | INTRAVENOUS | Status: AC | PRN
  Administered 2018-07-01: 2 mL via INTRAVENOUS

## 2018-07-01 MED ORDER — PROMETHAZINE HCL 25 MG PO TABS: 12.5000 mg | ORAL_TABLET | Freq: Four times a day (QID) | ORAL | 0 refills | Status: DC | PRN

## 2018-07-01 NOTE — Telephone Encounter (Signed)
Refill request for general medication: Phenergan 25 mg  Last office visit: 06/19/2018  Last physical exam: 12/25/2016  Follow-ups on file. 01/27/2019

## 2018-07-02 MED ORDER — SACUBITRIL-VALSARTAN 24-26 MG PO TABS: 1.0000 | ORAL_TABLET | Freq: Two times a day (BID) | ORAL | 2 refills | Status: DC

## 2018-07-02 NOTE — Telephone Encounter (Signed)
Refill sent. Patient saw Dr Caryl Comes on 06/24/18.

## 2018-07-12 ENCOUNTER — Other Ambulatory Visit: Payer: Self-pay | Admitting: Cardiovascular Disease

## 2018-07-17 ENCOUNTER — Telehealth: Payer: Self-pay | Admitting: *Deleted

## 2018-07-17 ENCOUNTER — Encounter: Payer: Self-pay | Admitting: Internal Medicine

## 2018-07-17 DIAGNOSIS — I472 Ventricular tachycardia, unspecified: Secondary | ICD-10-CM

## 2018-07-17 DIAGNOSIS — Z79899 Other long term (current) drug therapy: Secondary | ICD-10-CM

## 2018-07-17 DIAGNOSIS — I428 Other cardiomyopathies: Secondary | ICD-10-CM

## 2018-07-17 MED ORDER — SACUBITRIL-VALSARTAN 49-51 MG PO TABS: 1.0000 | ORAL_TABLET | Freq: Two times a day (BID) | ORAL | 6 refills | Status: DC

## 2018-07-17 NOTE — Progress Notes (Signed)
11 spoke with the patient.  With his LV dysfunction, would like to increase carvedilol and/or Entresto.  He is concerned about the effects of higher doses of in carvedilol on function, physically and mentally.  Hence, we have agreed to increase Entresto from 24/26--49/51.  Will need a metabolic profile in about 2 weeks.

## 2018-07-17 NOTE — Telephone Encounter (Signed)
I spoke with the patient. He is aware of Dr. Olin Pia recommendations to increase entresto to 49/51 mg BID. Confirmed the patient's [facility]- [facility] [address]. He will come to the office on Friday 3/13 at 1:00 pm for a repeat BMP.

## 2018-07-17 NOTE — Telephone Encounter (Signed)
-----   Message from Deboraha Sprang, MD sent at 07/17/2018  9:52 AM EST ----- Nira Conn, spoke with Mr. Humphrey.  Can you help me increase his Entresto from 32/00--37/94 with a metabolic profile in a couple of weeks

## 2018-08-01 ENCOUNTER — Other Ambulatory Visit: Payer: 59

## 2018-08-01 ENCOUNTER — Other Ambulatory Visit: Payer: Self-pay

## 2018-08-01 DIAGNOSIS — I472 Ventricular tachycardia, unspecified: Secondary | ICD-10-CM

## 2018-08-01 DIAGNOSIS — I428 Other cardiomyopathies: Secondary | ICD-10-CM

## 2018-08-01 DIAGNOSIS — Z79899 Other long term (current) drug therapy: Secondary | ICD-10-CM | POA: Diagnosis not present

## 2018-08-02 LAB — BASIC METABOLIC PANEL
BUN / CREAT RATIO: 15 (ref 9–20)
BUN: 15 mg/dL (ref 6–24)
CO2: 22 mmol/L (ref 20–29)
Calcium: 9.5 mg/dL (ref 8.7–10.2)
Chloride: 105 mmol/L (ref 96–106)
Creatinine, Ser: 1.03 mg/dL (ref 0.76–1.27)
GFR calc non Af Amer: 88 mL/min/{1.73_m2} (ref >59)
GFR, EST AFRICAN AMERICAN: 102 mL/min/{1.73_m2} (ref >59)
Glucose: 97 mg/dL (ref 65–99)
Potassium: 4.3 mmol/L (ref 3.5–5.2)
Sodium: 139 mmol/L (ref 134–144)

## 2018-08-06 ENCOUNTER — Telehealth: Payer: Self-pay | Admitting: Internal Medicine

## 2018-08-06 ENCOUNTER — Other Ambulatory Visit: Payer: Self-pay | Admitting: *Deleted

## 2018-08-06 MED ORDER — SPIRONOLACTONE 25 MG PO TABS: ORAL_TABLET | ORAL | 3 refills | Status: DC

## 2018-08-06 NOTE — Telephone Encounter (Signed)
Patient returning call for lab results Would like to discuss with nurse  Please call

## 2018-08-06 NOTE — Telephone Encounter (Signed)
°*  STAT* If patient is at the pharmacy, call can be transferred to refill team.   1. Which medications need to be refilled? (please list name of each medication and dose if known) spironolactone 25 MG 0.5 tablet daily   2. Which pharmacy/location (including street and city if local pharmacy) is medication to be sent to? [facility]  3. Do they need a 30 day or 90 day supply? 90 day

## 2018-08-06 NOTE — Telephone Encounter (Signed)
I spoke with the patient. He had questions about:  1) His spironolactone refill- I advised this was sent to [facility] today- he was ok with this  2) His lab results- he was understanding of results.  3) He was wondering on higher dose entresto if this does not "cover" him as long. He is noticing feelings of some slight dizziness when coming due for the 2nd dose. I advised I am unsure why he is feeling as opposed to the lower dose unless he is having a greater shift in his BP due to the higher dosing. I have encouraged him to maintain this dose with the hope his body will adjust.  The patient voices understanding of the above and he is agreeable.

## 2018-08-14 ENCOUNTER — Other Ambulatory Visit: Payer: Self-pay

## 2018-08-14 ENCOUNTER — Ambulatory Visit (INDEPENDENT_AMBULATORY_CARE_PROVIDER_SITE_OTHER): Payer: 59 | Admitting: *Deleted

## 2018-08-14 DIAGNOSIS — I428 Other cardiomyopathies: Secondary | ICD-10-CM | POA: Diagnosis not present

## 2018-08-14 LAB — CUP PACEART REMOTE DEVICE CHECK
Brady Statistic RV Percent Paced: 0 %
HIGH POWER IMPEDANCE MEASURED VALUE: 54 Ohm
Lead Channel Impedance Value: 674 Ohm
Lead Channel Pacing Threshold Amplitude: 1.1 V
Lead Channel Setting Pacing Pulse Width: 0.4 ms
MDC IDC LEAD IMPLANT DT: 20080214
MDC IDC LEAD LOCATION: 753860
MDC IDC LEAD SERIAL: 178017
MDC IDC MSMT BATTERY REMAINING LONGEVITY: 102 mo
MDC IDC MSMT BATTERY REMAINING PERCENTAGE: 100 %
MDC IDC MSMT LEADCHNL RV PACING THRESHOLD PULSEWIDTH: 0.4 ms
MDC IDC PG IMPLANT DT: 20141216
MDC IDC PG SERIAL: 126781
MDC IDC SESS DTM: 20200326070100
MDC IDC SET LEADCHNL RV PACING AMPLITUDE: 2.4 V
MDC IDC SET LEADCHNL RV SENSING SENSITIVITY: 0.6 mV

## 2018-08-20 ENCOUNTER — Encounter: Payer: Self-pay | Admitting: Cardiology

## 2018-08-20 NOTE — Progress Notes (Signed)
Remote ICD transmission.   

## 2018-09-02 ENCOUNTER — Other Ambulatory Visit: Payer: Self-pay | Admitting: Internal Medicine

## 2018-10-16 ENCOUNTER — Other Ambulatory Visit: Payer: Self-pay | Admitting: Internal Medicine

## 2018-11-13 ENCOUNTER — Ambulatory Visit (INDEPENDENT_AMBULATORY_CARE_PROVIDER_SITE_OTHER): Payer: 59 | Admitting: *Deleted

## 2018-11-13 DIAGNOSIS — I428 Other cardiomyopathies: Secondary | ICD-10-CM | POA: Diagnosis not present

## 2018-11-14 ENCOUNTER — Telehealth: Payer: Self-pay

## 2018-11-14 NOTE — Telephone Encounter (Signed)
Left message for patient to remind of missed remote transmission.  

## 2018-11-15 LAB — CUP PACEART REMOTE DEVICE CHECK
Battery Remaining Longevity: 102 mo
Battery Remaining Percentage: 100 %
Brady Statistic RV Percent Paced: 0 %
Date Time Interrogation Session: 20200626232800
HighPow Impedance: 49 Ohm
Implantable Lead Implant Date: 20080214
Implantable Lead Location: 753860
Implantable Lead Model: 185
Implantable Lead Serial Number: 178017
Implantable Pulse Generator Implant Date: 20141216
Lead Channel Impedance Value: 570 Ohm
Lead Channel Pacing Threshold Amplitude: 1.1 V
Lead Channel Pacing Threshold Pulse Width: 0.4 ms
Lead Channel Setting Pacing Amplitude: 2.4 V
Lead Channel Setting Pacing Pulse Width: 0.4 ms
Lead Channel Setting Sensing Sensitivity: 0.6 mV
Pulse Gen Serial Number: 126781

## 2018-11-23 ENCOUNTER — Encounter: Payer: Self-pay | Admitting: Cardiology

## 2018-11-23 NOTE — Progress Notes (Signed)
Remote ICD transmission.   

## 2018-12-15 ENCOUNTER — Telehealth: Payer: Self-pay | Admitting: Internal Medicine

## 2018-12-15 NOTE — Telephone Encounter (Signed)
°*  STAT* If patient is at the pharmacy, call can be transferred to refill team.   1. Which medications need to be refilled? (please list name of each medication and dose if known) Entresto 49-51 Mg 2 tablets daily   2. Which pharmacy/location (including street and city if local pharmacy) is medication to be sent to? Would prefer [facility] on [address] but if insurance requires it, will need to go through [employer]   3. Do they need a 30 day or 90 day supply? 90 day

## 2018-12-16 ENCOUNTER — Other Ambulatory Visit: Payer: Self-pay | Admitting: *Deleted

## 2018-12-16 MED ORDER — SACUBITRIL-VALSARTAN 49-51 MG PO TABS: 1.0000 | ORAL_TABLET | Freq: Two times a day (BID) | ORAL | 1 refills | Status: DC

## 2018-12-16 NOTE — Telephone Encounter (Signed)
Requested Prescriptions   Signed Prescriptions Disp Refills  . sacubitril-valsartan (ENTRESTO) 49-51 MG 180 tablet 1    Sig: Take 1 tablet by mouth 2 (two) times daily.    Authorizing Provider: Deboraha Sprang    Ordering User: Britt Bottom

## 2018-12-16 NOTE — Telephone Encounter (Signed)
Requested Prescriptions   Signed Prescriptions Disp Refills  . sacubitril-valsartan (ENTRESTO) 49-51 MG 180 tablet 1    Sig: Take 1 tablet by mouth 2 (two) times daily.    Authorizing Provider: Deboraha Sprang    Ordering User: Josephina Shih to [place].

## 2018-12-22 ENCOUNTER — Other Ambulatory Visit: Payer: Self-pay

## 2018-12-22 MED ORDER — SACUBITRIL-VALSARTAN 49-51 MG PO TABS: 1.0000 | ORAL_TABLET | Freq: Two times a day (BID) | ORAL | 3 refills | Status: DC

## 2018-12-22 NOTE — Telephone Encounter (Signed)
*  STAT* If patient is at the pharmacy, call can be transferred to refill team.   1. Which medications need to be refilled? (please list name of each medication and dose if known) Entresto  2. Which pharmacy/location (including street and city if local pharmacy) is medication to be sent to?[facility]  3. Do they need a 30 day or 90 day supply? 90  Patient would also like some samples to get him through until [facility] is filled.

## 2018-12-23 MED ORDER — SACUBITRIL-VALSARTAN 49-51 MG PO TABS: 1.0000 | ORAL_TABLET | Freq: Two times a day (BID) | ORAL | 3 refills | Status: DC

## 2019-02-12 ENCOUNTER — Ambulatory Visit (INDEPENDENT_AMBULATORY_CARE_PROVIDER_SITE_OTHER): Payer: 59 | Admitting: *Deleted

## 2019-02-12 DIAGNOSIS — I428 Other cardiomyopathies: Secondary | ICD-10-CM | POA: Diagnosis not present

## 2019-02-13 LAB — CUP PACEART REMOTE DEVICE CHECK
Battery Remaining Longevity: 96 mo
Battery Remaining Percentage: 100 %
Brady Statistic RV Percent Paced: 0 %
Date Time Interrogation Session: 20200924070300
HighPow Impedance: 56 Ohm
Implantable Lead Implant Date: 20080214
Implantable Lead Location: 753860
Implantable Lead Model: 185
Implantable Lead Serial Number: 178017
Implantable Pulse Generator Implant Date: 20141216
Lead Channel Impedance Value: 648 Ohm
Lead Channel Pacing Threshold Amplitude: 1.1 V
Lead Channel Pacing Threshold Pulse Width: 0.4 ms
Lead Channel Setting Pacing Amplitude: 2.4 V
Lead Channel Setting Pacing Pulse Width: 0.4 ms
Lead Channel Setting Sensing Sensitivity: 0.6 mV
Pulse Gen Serial Number: 126781

## 2019-02-16 ENCOUNTER — Encounter: Payer: Self-pay | Admitting: Family Medicine

## 2019-02-16 ENCOUNTER — Ambulatory Visit (INDEPENDENT_AMBULATORY_CARE_PROVIDER_SITE_OTHER): Payer: 59 | Admitting: Family Medicine

## 2019-02-16 ENCOUNTER — Other Ambulatory Visit: Payer: Self-pay

## 2019-02-16 VITALS — BP 122/84 | HR 66 | Temp 97.2°F | Resp 14 | Ht 70.0 in | Wt 217.0 lb

## 2019-02-16 DIAGNOSIS — J45909 Unspecified asthma, uncomplicated: Secondary | ICD-10-CM | POA: Diagnosis not present

## 2019-02-16 DIAGNOSIS — E785 Hyperlipidemia, unspecified: Secondary | ICD-10-CM

## 2019-02-16 DIAGNOSIS — R350 Frequency of micturition: Secondary | ICD-10-CM | POA: Diagnosis not present

## 2019-02-16 DIAGNOSIS — Z0000 Encounter for general adult medical examination without abnormal findings: Secondary | ICD-10-CM | POA: Diagnosis not present

## 2019-02-16 DIAGNOSIS — K529 Noninfective gastroenteritis and colitis, unspecified: Secondary | ICD-10-CM

## 2019-02-16 DIAGNOSIS — N529 Male erectile dysfunction, unspecified: Secondary | ICD-10-CM

## 2019-02-16 DIAGNOSIS — Z952 Presence of prosthetic heart valve: Secondary | ICD-10-CM

## 2019-02-16 DIAGNOSIS — I429 Cardiomyopathy, unspecified: Secondary | ICD-10-CM

## 2019-02-16 DIAGNOSIS — Z125 Encounter for screening for malignant neoplasm of prostate: Secondary | ICD-10-CM

## 2019-02-16 DIAGNOSIS — Z95810 Presence of automatic (implantable) cardiac defibrillator: Secondary | ICD-10-CM

## 2019-02-16 DIAGNOSIS — I10 Essential (primary) hypertension: Secondary | ICD-10-CM

## 2019-02-16 MED ORDER — SILDENAFIL CITRATE 20 MG PO TABS: 40.0000 mg | ORAL_TABLET | Freq: Every day | ORAL | 2 refills | Status: DC | PRN

## 2019-02-16 MED ORDER — ATROVENT HFA 17 MCG/ACT IN AERS: 2.0000 | INHALATION_SPRAY | Freq: Four times a day (QID) | RESPIRATORY_TRACT | 2 refills | Status: DC | PRN

## 2019-02-16 NOTE — Patient Instructions (Addendum)
Preventive Care 40-45 Years Old, Male °Preventive care refers to lifestyle choices and visits with your health care provider that can promote health and wellness. This includes: °· A yearly physical exam. This is also called an annual well check. °· Regular dental and eye exams. °· Immunizations. °· Screening for certain conditions. °· Healthy lifestyle choices, such as eating a healthy diet, getting regular exercise, not using drugs or products that contain nicotine and tobacco, and limiting alcohol use. °What can I expect for my preventive care visit? °Physical exam °Your health care provider will check: °· Height and weight. These may be used to calculate body mass index (BMI), which is a measurement that tells if you are at a healthy weight. °· Heart rate and blood pressure. °· Your skin for abnormal spots. °Counseling °Your health care provider may ask you questions about: °· Alcohol, tobacco, and drug use. °· Emotional well-being. °· Home and relationship well-being. °· Sexual activity. °· Eating habits. °· Work and work environment. °What immunizations do I need? ° °Influenza (flu) vaccine °· This is recommended every year. °Tetanus, diphtheria, and pertussis (Tdap) vaccine °· You may need a Td booster every 10 years. °Varicella (chickenpox) vaccine °· You may need this vaccine if you have not already been vaccinated. °Zoster (shingles) vaccine °· You may need this after age 60. °Measles, mumps, and rubella (MMR) vaccine °· You may need at least one dose of MMR if you were born in 1957 or later. You may also need a second dose. °Pneumococcal conjugate (PCV13) vaccine °· You may need this if you have certain conditions and were not previously vaccinated. °Pneumococcal polysaccharide (PPSV23) vaccine °· You may need one or two doses if you smoke cigarettes or if you have certain conditions. °Meningococcal conjugate (MenACWY) vaccine °· You may need this if you have certain conditions. °Hepatitis A  vaccine °· You may need this if you have certain conditions or if you travel or work in places where you may be exposed to hepatitis A. °Hepatitis B vaccine °· You may need this if you have certain conditions or if you travel or work in places where you may be exposed to hepatitis B. °Haemophilus influenzae type b (Hib) vaccine °· You may need this if you have certain risk factors. °Human papillomavirus (HPV) vaccine °· If recommended by your health care provider, you may need three doses over 6 months. °You may receive vaccines as individual doses or as more than one vaccine together in one shot (combination vaccines). Talk with your health care provider about the risks and benefits of combination vaccines. °What tests do I need? °Blood tests °· Lipid and cholesterol levels. These may be checked every 5 years, or more frequently if you are over 50 years old. °· Hepatitis C test. °· Hepatitis B test. °Screening °· Lung cancer screening. You may have this screening every year starting at age 55 if you have a 30-pack-year history of smoking and currently smoke or have quit within the past 15 years. °· Prostate cancer screening. Recommendations will vary depending on your family history and other risks. °· Colorectal cancer screening. All adults should have this screening starting at age 50 and continuing until age 75. Your health care provider may recommend screening at age 45 if you are at increased risk. You will have tests every 1-10 years, depending on your results and the type of screening test. °· Diabetes screening. This is done by checking your blood sugar (glucose) after you have not eaten   for a while (fasting). You may have this done every 1-3 years.  Sexually transmitted disease (STD) testing. Follow these instructions at home: Eating and drinking  Eat a diet that includes fresh fruits and vegetables, whole grains, lean protein, and low-fat dairy products.  Take vitamin and mineral supplements as  recommended by your health care provider.  Do not drink alcohol if your health care provider tells you not to drink.  If you drink alcohol: ? Limit how much you have to 0-2 drinks a day. ? Be aware of how much alcohol is in your drink. In the [place] one drink equals one 12 oz bottle of beer (355 mL), one 5 oz glass of wine (148 mL), or one 1 oz glass of hard liquor (44 mL). Lifestyle  Take daily care of your teeth and gums.  Stay active. Exercise for at least 30 minutes on 5 or more days each week.  Do not use any products that contain nicotine or tobacco, such as cigarettes, e-cigarettes, and chewing tobacco. If you need help quitting, ask your health care provider.  If you are sexually active, practice safe sex. Use a condom or other form of protection to prevent STIs (sexually transmitted infections).  Talk with your health care provider about taking a low-dose aspirin every day starting at age 27. What's next?  Go to your health care provider once a year for a well check visit.  Ask your health care provider how often you should have your eyes and teeth checked.  Stay up to date on all vaccines. This information is not intended to replace advice given to you by your health care provider. Make sure you discuss any questions you have with your health care provider. Document Released: 06/03/2015 Document Revised: 05/01/2018 Document Reviewed: 05/01/2018 Elsevier Patient Education  2020 [place] Antigen Test Why am I having this test? The prostate-specific antigen (PSA) test is a screening test for prostate cancer. It can identify early signs of prostate cancer, which may allow for more effective treatment. Your health care provider may recommend that you have a PSA test starting at age 31 or that you have one earlier or later, depending on your risk factors for prostate cancer. You may also have a PSA test:  To monitor treatment of prostate cancer.  To  check whether prostate cancer has returned after treatment.  If you have signs of other conditions that can affect PSA levels, such as: ? An enlarged prostate that is not caused by cancer (benign prostatic hyperplasia, BPH). This condition is very common in older men. ? A prostate infection. What is being tested? This test measures the amount of PSA in your blood. PSA is a protein that is made in the prostate. The prostate naturally produces more PSA as you age, but very high levels may be a sign of a medical condition. What kind of sample is taken?  A blood sample is required for this test. It is usually collected by inserting a needle into a blood vessel or by sticking a finger with a small needle. Blood for this test should be drawn before having an exam of the prostate. How do I prepare for this test? Do not ejaculate starting 24 hours before your test, or as long as told by your health care provider. Tell a health care provider about:  Any allergies you have.  All medicines you are taking, including vitamins, herbs, eye drops, creams, and over-the-counter medicines. This also includes: ? Medicines  to assist with hair growth, such as finasteride. ? Any recent exposure to a medicine called diethylstilbestrol.  Any blood disorders you have.  Any recent procedures you have had, especially any procedures involving the prostate or rectum.  Any medical conditions you have.  Any recent urinary tract infections (UTIs) you have had. How are the results reported? Your test results will be reported as a value that indicates how much PSA is in your blood. This will be given as nanograms of PSA per milliliter of blood (ng/mL). Your health care provider will compare your results to normal ranges that were established after testing a large group of people (reference ranges). Reference ranges may vary among labs and hospitals. PSA levels vary from person to person and generally increase with age.  Because of this variation, there is no single PSA value that is considered normal for everyone. Instead, PSA reference ranges are used to describe whether your PSA levels are considered low or high (elevated). Common reference ranges are:  Low: 0-2.5 ng/mL.  Slightly to moderately elevated: 2.6-10.0 ng/mL.  Moderately elevated: 10.0-19.9 ng/mL.  Significantly elevated: 20 ng/mL or greater. Sometimes, the test results may report that a condition is present when it is not present (false-positive result). What do the results mean? A test result that is higher than 4 ng/mL may mean that you are at an increased risk for prostate cancer. However, a PSA test by itself is not enough to diagnose prostate cancer. High PSA levels may also be caused by the natural aging process, prostate infection, or BPH. PSA screening cannot tell you if your PSA is high due to cancer or a different cause. A prostate biopsy is the only way to diagnose prostate cancer. A risk of having the PSA test is diagnosing and treating prostate cancer that would never have caused any symptoms or problems (overdiagnosis and overtreatment). Talk with your health care provider about what your results mean. Questions to ask your health care provider Ask your health care provider, or the department that is doing the test:  When will my results be ready?  How will I get my results?  What are my treatment options?  What other tests do I need?  What are my next steps? Summary  The prostate-specific antigen (PSA) test is a screening test for prostate cancer.  Your health care provider may recommend that you have a PSA test starting at age 110 or that you have one earlier or later, depending on your risk factors for prostate cancer.  A test result that is higher than 4 ng/mL may mean that you are at an increased risk for prostate cancer. However, elevated levels can be caused by a number of conditions other than prostate  cancer.  Talk with your health care provider about what your results mean. This information is not intended to replace advice given to you by your health care provider. Make sure you discuss any questions you have with your health care provider. Document Released: 06/09/2004 Document Revised: 04/19/2017 Document Reviewed: 02/11/2017 Elsevier Patient Education  2020 [place].   Urinary Frequency, Adult Urinary frequency means urinating more often than usual. You may urinate every 1-2 hours even though you drink a normal amount of fluid and do not have a bladder infection or condition. Although you urinate more often than normal, the total amount of urine produced in a day is normal. With urinary frequency, you may have an urgent need to urinate often. The stress and anxiety of needing  to find a bathroom quickly can make this urge worse. This condition may go away on its own or you may need treatment at home. Home treatment may include bladder training, exercises, taking medicines, or making changes to your diet. Follow these instructions at home: Bladder health   Keep a bladder diary if told by your health care provider. Keep track of: ? What you eat and drink. ? How often you urinate. ? How much you urinate.  Follow a bladder training program if told by your health care provider. This may include: ? Learning to delay going to the bathroom. ? Double urinating (voiding). This helps if you are not completely emptying your bladder. ? Scheduled voiding.  Do Kegel exercises as told by your health care provider. Kegel exercises strengthen the muscles that help control urination, which may help the condition. Eating and drinking  If told by your health care provider, make diet changes, such as: ? Avoiding caffeine. ? Drinking fewer fluids, especially alcohol. ? Not drinking in the evening. ? Avoiding foods or drinks that may irritate the bladder. These include coffee, tea, soda,  artificial sweeteners, citrus, tomato-based foods, and chocolate. ? Eating foods that help prevent or ease constipation. Constipation can make this condition worse. Your health care provider may recommend that you:  Drink enough fluid to keep your urine pale yellow.  Take over-the-counter or prescription medicines.  Eat foods that are high in fiber, such as beans, whole grains, and fresh fruits and vegetables.  Limit foods that are high in fat and processed sugars, such as fried or sweet foods. General instructions  Take over-the-counter and prescription medicines only as told by your health care provider.  Keep all follow-up visits as told by your health care provider. This is important. Contact a health care provider if:  You start urinating more often.  You feel pain or irritation when you urinate.  You notice blood in your urine.  Your urine looks cloudy.  You develop a fever.  You begin vomiting. Get help right away if:  You are unable to urinate. Summary  Urinary frequency means urinating more often than usual. With urinary frequency, you may urinate every 1-2 hours even though you drink a normal amount of fluid and do not have a bladder infection or other bladder condition.  Your health care provider may recommend that you keep a bladder diary, follow a bladder training program, or make dietary changes.  If told by your health care provider, do Kegel exercises to strengthen the muscles that help control urination.  Take over-the-counter and prescription medicines only as told by your health care provider.  Contact a health care provider if your symptoms do not improve or get worse. This information is not intended to replace advice given to you by your health care provider. Make sure you discuss any questions you have with your health care provider. Document Released: 03/03/2009 Document Revised: 11/14/2017 Document Reviewed: 11/14/2017 Elsevier Patient Education   2020 [employer].

## 2019-02-16 NOTE — Progress Notes (Signed)
Patient: [name], Male    DOB: 05-Aug-1975, 43 y.o.   MRN: [id number] Arnetha Courser, MD Visit Date: 02/16/2019  Today's Provider: Delsa Grana, PA-C   Chief Complaint  Patient presents with  . Annual Exam  . Medication Refill   Subjective:   Annual physical exam:  [name] is a 43 y.o. male who presents today for health maintenance and annual & complete physical exam.  He feels well.  He reports exercising very little, should probably do more. Diet generally described as "a little sloppy:" eating more meat and less veggies than what he used to, not going into the office he has less salads and veggies. He reports he is sleeping well.  Pt new to me - he has cardiac pacemaker/defibillator, sees cardiology at least 2 x a year Has some dyslipidemia - not on statin, he had testing done by cardiology but said that he had very healthy coronary arteries.  He knows his HDL is a little low he tries to work on that with his diet.  Wants refill on ED meds, working for him no SE or concerns  Some urinary sx, had some dysuria and burning about 2 months ago, it resolved but he still has urinary frequency all day and all night, going at least every 2 hours.  No abd pain, flank pain, scrotal pain, hematuria.  Only sexually active with his wife.  Had PSA drawn last year and it was normal - LUTS/urinary questions below -   USPSTF grade A and B recommendations - reviewed and addressed today  Depression:  Phq 9 completed today by patient, was reviewed by me with patient in the room, score is  negative, pt feels good PHQ 2/9 Scores 02/16/2019 06/19/2018 03/28/2018 02/13/2018  PHQ - 2 Score 0 0 0 0  PHQ- 9 Score 0 - 0 2   Depression screen [facility] 2/9 02/16/2019 06/19/2018 03/28/2018 02/13/2018 02/13/2018  Decreased Interest 0 0 0 0 0  Down, Depressed, Hopeless 0 0 0 0 0  PHQ - 2 Score 0 0 0 0 0  Altered sleeping 0 - 0 1 -  Tired, decreased energy 0 - 0 1 -  Change in appetite 0 - 0 0 -   Feeling bad or failure about yourself  0 - 0 0 -  Trouble concentrating 0 - 0 0 -  Moving slowly or fidgety/restless 0 - 0 0 -  Suicidal thoughts 0 - 0 0 -  PHQ-9 Score 0 - 0 2 -  Difficult doing work/chores Not difficult at all - Not difficult at all Somewhat difficult -    Hep C Screening: N/a STD testing and prevention (HIV/chl/gon/syphilis): some dysuria that resolved Low risk for STDs Prostate cancer: Has done PSA in the past, would like to do again today Lab Results  Component Value Date   PSA 1.1 02/13/2018    IPSS Questionnaire (AUA-7): Over the past month.   1)  How often have you had a sensation of not emptying your bladder completely after you finish urinating?  1 - Less than 1 time in 5  2)  How often have you had to urinate again less than two hours after you finished urinating? 1 - Less than 1 time in 5  3)  How often have you found you stopped and started again several times when you urinated?  0 - Not at all  4) How difficult have you found it to postpone urination?  1 - Less  than 1 time in 5  5) How often have you had a weak urinary stream?  0 - Not at all  6) How often have you had to push or strain to begin urination?  0 - Not at all  7) How many times did you most typically get up to urinate from the time you went to bed until the time you got up in the morning?  2 - 2 times  Total score:  0-7 mildly symptomatic -     8-19 moderately symptomatic   20-35 severely symptomatic    Intimate partner violence:  Feels safe at home with wife  Advanced Care Planning:  A voluntary discussion about advance care planning including the explanation and discussion of advance directives.  Discussed health care proxy and Living will, and the patient was able to identify a health care proxy as wife Artavis Guzzardo.  Patient does not have a living will at present time. If patient does have living will, I have requested they bring this to the clinic to be scanned in to their chart.   Skin cancer:   Pt reports no hx of skin cancer, suspicious lesions/biopsies in the past.  Colorectal cancer:  colonoscopy is 2017 - for diarrhea and IBS sx  Lung cancer:   Low Dose CT Chest recommended if Age 33-80 years, 30 pack-year currently smoking OR have quit w/in 15years. Patient does not qualify.   Social History   Tobacco Use  . Smoking status: Never Smoker  . Smokeless tobacco: Never Used  Substance Use Topics  . Alcohol use: No    Alcohol screening:   Office Visit from 02/16/2019 in [facility]  AUDIT-C Score  0     AAA:  N/A The USPSTF recommends one-time screening with ultrasonography in men ages 78 to 67 years who have ever smoked  ECG: - per cardiology   Blood pressure/Hypertension: BP Readings from Last 3 Encounters:  02/16/19 122/84  06/24/18 (!) 144/82  06/19/18 110/66   Weight/Obesity: Wt Readings from Last 3 Encounters:  02/16/19 217 lb (98.4 kg)  06/24/18 205 lb (93 kg)  06/19/18 207 lb (93.9 kg)   BMI Readings from Last 3 Encounters:  02/16/19 31.14 kg/m  06/24/18 29.41 kg/m  06/19/18 29.70 kg/m    Lipids:  Lab Results  Component Value Date   CHOL 165 02/13/2018   CHOL 160 12/25/2016   CHOL 144 11/28/2016   Lab Results  Component Value Date   HDL 38 (L) 02/13/2018   HDL 44 12/25/2016   HDL 42 11/28/2016   Lab Results  Component Value Date   LDLCALC 110 (H) 02/13/2018   LDLCALC 92 12/25/2016   LDLCALC 88 11/28/2016   Lab Results  Component Value Date   TRIG 80 02/13/2018   TRIG 121 12/25/2016   TRIG 68 11/28/2016   Lab Results  Component Value Date   CHOLHDL 4.3 02/13/2018   CHOLHDL 3.6 12/25/2016   CHOLHDL 3.4 11/28/2016   No results found for: LDLDIRECT Based on the results of lipid panel his/her cardiovascular risk factor ( using [employer] )  in the next 10 years is : The 10-year ASCVD risk score Mikey Bussing DC Jr., et al., 2013) is: 6.1%   Values used to calculate the score:     Age: 61 years      Sex: Male     Is Non-Hispanic African American: Yes     Diabetic: No     Tobacco smoker: No  Systolic Blood Pressure: 123XX123 mmHg     Is BP treated: Yes     HDL Cholesterol: 38 mg/dL     Total Cholesterol: 165 mg/dL Glucose:  Glucose  Date Value Ref Range Status  08/01/2018 97 65 - 99 mg/dL Final  06/24/2018 88 65 - 99 mg/dL Final   Glucose, Bld  Date Value Ref Range Status  04/11/2018 106 (H) 70 - 99 mg/dL Final  02/13/2018 93 65 - 99 mg/dL Final    Comment:    .            Fasting reference interval .   11/12/2017 90 70 - 99 mg/dL Final    Comment:    Please note change in reference range.   Glucose-Capillary  Date Value Ref Range Status  09/05/2011 112 (H) 70 - 99 mg/dL Final    Social History      He  reports that he has never smoked. He has never used smokeless tobacco. He reports that he does not drink alcohol or use drugs.       Social History   Socioeconomic History  . Marital status: Married    Spouse name: Anderson Malta  . Number of children: 2  . Years of education: 76  . Highest education level: Bachelor's degree (e.g., BA, AB, BS)  Occupational History  . 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 Financial resource strain: Not hard at all  . Food insecurity    Worry: Never true    Inability: Never true  . Transportation needs    Medical: No    Non-medical: No  Tobacco Use  . Smoking status: Never Smoker  . Smokeless tobacco: Never Used  Substance and Sexual Activity  . Alcohol use: No  . Drug use: No  . Sexual activity: Yes    Partners: Female  Lifestyle  . Physical activity    Days per week: 1 day    Minutes per session: 10 min  . Stress: Not at all  Relationships  . Social Herbalist on phone: Twice a week    Gets together: Once a week    Attends religious service: Never    Active member of club or organization: No    Attends meetings of clubs or organizations: Never     Relationship status: Married  Other Topics Concern  . Not on file  Social History Narrative   Married         Social History   Socioeconomic History  . Marital status: Married    Spouse name: Anderson Malta  . Number of children: 2  . Years of education: 60  . Highest education level: Bachelor's degree (e.g., BA, AB, BS)  Occupational History  . 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 Financial resource strain: Not hard at all  . Food insecurity    Worry: Never true    Inability: Never true  . Transportation needs    Medical: No    Non-medical: No  Tobacco Use  . Smoking status: Never Smoker  . Smokeless tobacco: Never Used  Substance and Sexual Activity  . Alcohol use: No  . Drug use: No  . Sexual activity: Yes    Partners: Female  Lifestyle  . Physical activity    Days per week: 1 day  Minutes per session: 10 min  . Stress: Not at all  Relationships  . Social Herbalist on phone: Twice a week    Gets together: Once a week    Attends religious service: Never    Active member of club or organization: No    Attends meetings of clubs or organizations: Never    Relationship status: Married  . Intimate partner violence    Fear of current or ex partner: No    Emotionally abused: No    Physically abused: No    Forced sexual activity: No  Other Topics Concern  . Not on file  Social History Narrative   Married    Family History        Family Status  Relation Name Status  . Mother  Alive  . Father  Deceased       sucide  . Sister  Alive  . Daughter  Alive  . Son  Alive  . MGM  Deceased       cancer  . MGF  Deceased       cancer  . PGM  Deceased       accident  . PGF  Deceased       unknown  . Sister  Alive        His family history includes Cancer in his maternal grandfather and maternal grandmother; Heart disease in his father; Hypertension in his maternal grandfather and maternal  grandmother.       Family History  Problem Relation Age of Onset  . Heart disease Father   . Hypertension Maternal Grandmother   . Cancer Maternal Grandmother   . Hypertension Maternal Grandfather   . Cancer Maternal Grandfather     Patient Active Problem List   Diagnosis Date Noted  . Epigastric pain 05/09/2017  . Lipoma of buttock 01/11/2017  . ED (erectile dysfunction) 12/01/2016  . Syncope 05/16/2016  . Sustained ventricular tachycardia ([place]) 05/16/2016  . Ventricular tachycardia ([place]) 05/16/2016  . Prostate cancer screening 12/22/2015  . Hearing loss of both ears 12/22/2015  . Abnormal findings-gastrointestinal tract   . IBD (inflammatory bowel disease) 09/23/2015  . Pain in lower back 09/15/2015  . Night sweats 09/15/2015  . Groin pain 07/29/2015  . Screening for STD (sexually transmitted disease) 07/29/2015  . Preventative health care 05/10/2015  . Presence of cardiac defibrillator 05/10/2015  . Hypertension goal BP (blood pressure) < 140/90 12/13/2011  . H/O aortic valve replacement 08/08/2009  . Cardiomyopathy, secondary ([place]) 03/09/2009  . History of ventricular tachycardia 03/09/2009    Past Surgical History:  Procedure Laterality Date  . ANGIOPLASTY     RCA repair with vein angioplasty following injury with the aforementioned surgery  . AORTIC VALVE REPLACEMENT     Bioprosthesis  . CARDIAC DEFIBRILLATOR PLACEMENT    . CARDIAC VALVE REPLACEMENT    . CHOLECYSTECTOMY    . COLONOSCOPY WITH PROPOFOL N/A 12/06/2015   Procedure: COLONOSCOPY WITH PROPOFOL;  Surgeon: [name], MD;  Location: [facility] ENDOSCOPY;  Service: Endoscopy;  Laterality: N/A;  . CORONARY ARTERY BYPASS GRAFT    . IMPLANTABLE CARDIOVERTER DEFIBRILLATOR GENERATOR CHANGE N/A 05/05/2013   Procedure: IMPLANTABLE CARDIOVERTER DEFIBRILLATOR GENERATOR CHANGE;  Surgeon: Deboraha Sprang, MD;  Location: [facility] CATH LAB;  Service: Cardiovascular;  Laterality: N/A;  . INSERT / REPLACE / REMOVE PACEMAKER    .  VSD REPAIR       Current Outpatient Medications:  .  ATROVENT HFA 17 MCG/ACT inhaler, Inhale 2  puffs into the lungs 4 (four) times daily as needed., Disp: , Rfl:  .  carvedilol (COREG) 12.5 MG tablet, TAKE 1 TABLET TWICE A DAY, Disp: 180 tablet, Rfl: 2 .  fluticasone (FLONASE) 50 MCG/ACT nasal spray, Place 2 sprays into both nostrils daily. (Patient taking differently: Place 2 sprays into both nostrils as needed. ), Disp: 16 g, Rfl: 2 .  loratadine (CLARITIN) 5 MG chewable tablet, Chew 5 mg by mouth daily as needed for allergies., Disp: , Rfl:  .  magnesium oxide (MAG-OX) 400 (241.3 Mg) MG tablet, Take 1 tablet (400 mg total) by mouth 2 (two) times daily., Disp: 60 tablet, Rfl: 0 .  Multiple Vitamin (MULTIVITAMIN) tablet, Take 1 tablet by mouth daily., Disp: , Rfl:  .  ondansetron (ZOFRAN ODT) 4 MG disintegrating tablet, Take 1 tablet (4 mg total) by mouth every 8 (eight) hours as needed for nausea or vomiting., Disp: 20 tablet, Rfl: 0 .  promethazine (PHENERGAN) 25 MG tablet, Take 0.5-1 tablets (12.5-25 mg total) by mouth every 6 (six) hours as needed for nausea or vomiting., Disp: 20 tablet, Rfl: 0 .  ranolazine (RANEXA) 500 MG 12 hr tablet, TAKE 1 TABLET TWICE A DAY, Disp: 180 tablet, Rfl: 4 .  sacubitril-valsartan (ENTRESTO) 49-51 MG, Take 1 tablet by mouth 2 (two) times daily., Disp: 180 tablet, Rfl: 3 .  sildenafil (REVATIO) 20 MG tablet, Take 2-3 tablets (40-60 mg total) by mouth daily as needed. Never combine with nitrates, Disp: 30 tablet, Rfl: 0 .  spironolactone (ALDACTONE) 25 MG tablet, TAKE 1/2 TABLET BY MOUTH EVERY DAY, Disp: 45 tablet, Rfl: 3 .  tiZANidine (ZANAFLEX) 4 MG capsule, Take at bedtime, if it does not cause drowsiness you can take it up to every 8 hours. (Patient not taking: Reported on 02/16/2019), Disp: 30 capsule, Rfl: 0  Allergies  Allergen Reactions  . Shellfish Allergy Nausea And Vomiting    Patient Care Team: Lada, Satira Anis, MD as PCP - General (Family  Medicine) Carloyn Manner, MD as Referring Physician (Otolaryngology) Bary Castilla Forest Gleason, MD (General Surgery) Arnetha Courser, MD as Attending Physician (Family Medicine)  I personally reviewed active problem list, medication list, allergies, family history, social history, health maintenance, notes from last encounter, lab results, endoscopy procedure notes with the patient/caregiver today.  Review of Systems  Constitutional: Negative.  Negative for activity change, appetite change, fatigue and unexpected weight change.  HENT: Negative.   Eyes: Negative.   Respiratory: Negative.  Negative for chest tightness and shortness of breath.   Cardiovascular: Negative.  Negative for chest pain, palpitations and leg swelling.  Gastrointestinal: Negative.  Negative for abdominal pain and blood in stool.  Endocrine: Positive for polyuria. Negative for cold intolerance, heat intolerance, polydipsia and polyphagia.  Genitourinary: Positive for frequency. Negative for decreased urine volume, difficulty urinating, discharge, flank pain, genital sores, penile pain, penile swelling, scrotal swelling, testicular pain and urgency.  Musculoskeletal: Negative.  Negative for arthralgias and joint swelling.  Skin: Negative.  Negative for color change and pallor.  Allergic/Immunologic: Negative.   Neurological: Negative.  Negative for dizziness, syncope, weakness, light-headedness and numbness.  Hematological: Negative.   Psychiatric/Behavioral: Negative.  Negative for confusion, dysphoric mood, self-injury and suicidal ideas. The patient is not nervous/anxious.   All other systems reviewed and are negative.         Objective:   Vitals:  Vitals:   02/16/19 0954  BP: 122/84  Pulse: 66  Resp: 14  Temp: (!) 97.2 F (36.2 C)  SpO2: 99%  Weight: 217 lb (98.4 kg)  Height: 5\' 10"  (1.778 m)    Body mass index is 31.14 kg/m.  Physical Exam Vitals signs and nursing note reviewed.  Constitutional:       General: He is not in acute distress.    Appearance: Normal appearance. He is well-developed. He is obese. He is not ill-appearing, toxic-appearing or diaphoretic.  HENT:     Head: Normocephalic and atraumatic.     Jaw: No trismus.     Right Ear: Tympanic membrane, ear canal and external ear normal.     Left Ear: Tympanic membrane, ear canal and external ear normal.     Nose: Nose normal. No mucosal edema, congestion or rhinorrhea.     Right Sinus: No maxillary sinus tenderness or frontal sinus tenderness.     Left Sinus: No maxillary sinus tenderness or frontal sinus tenderness.     Mouth/Throat:     Mouth: Mucous membranes are moist.     Pharynx: Oropharynx is clear. Uvula midline. No oropharyngeal exudate, posterior oropharyngeal erythema or uvula swelling.  Eyes:     General: Lids are normal. No scleral icterus.       Right eye: No discharge.        Left eye: No discharge.     Conjunctiva/sclera: Conjunctivae normal.     Pupils: Pupils are equal, round, and reactive to light.  Neck:     Musculoskeletal: Normal range of motion and neck supple.     Trachea: Trachea and phonation normal. No tracheal deviation.  Cardiovascular:     Rate and Rhythm: Normal rate and regular rhythm.     Pulses: Normal pulses.          Radial pulses are 2+ on the right side and 2+ on the left side.       Posterior tibial pulses are 2+ on the right side and 2+ on the left side.     Heart sounds: Murmur present. No friction rub. No gallop.   Pulmonary:     Effort: Pulmonary effort is normal.     Breath sounds: Normal breath sounds. No wheezing, rhonchi or rales.  Abdominal:     General: Bowel sounds are normal. There is no distension.     Palpations: Abdomen is soft.     Tenderness: There is no abdominal tenderness. There is no guarding or rebound.  Musculoskeletal: Normal range of motion.     Right lower leg: No edema.     Left lower leg: No edema.  Lymphadenopathy:     Cervical: No cervical  adenopathy.  Skin:    General: Skin is warm and dry.     Capillary Refill: Capillary refill takes less than 2 seconds.     Coloration: Skin is not jaundiced or pale.     Findings: No rash.  Neurological:     Mental Status: He is alert.     Gait: Gait normal.  Psychiatric:        Mood and Affect: Mood normal.        Speech: Speech normal.        Behavior: Behavior normal.     PHQ2/9: Depression screen [facility] 2/9 02/16/2019 06/19/2018 03/28/2018 02/13/2018 02/13/2018  Decreased Interest 0 0 0 0 0  Down, Depressed, Hopeless 0 0 0 0 0  PHQ - 2 Score 0 0 0 0 0  Altered sleeping 0 - 0 1 -  Tired, decreased energy 0 - 0 1 -  Change in appetite 0 -  0 0 -  Feeling bad or failure about yourself  0 - 0 0 -  Trouble concentrating 0 - 0 0 -  Moving slowly or fidgety/restless 0 - 0 0 -  Suicidal thoughts 0 - 0 0 -  PHQ-9 Score 0 - 0 2 -  Difficult doing work/chores Not difficult at all - Not difficult at all Somewhat difficult -    Fall Risk: Fall Risk  02/16/2019 06/19/2018 03/28/2018 05/09/2017 12/25/2016  Falls in the past year? 0 0 0 No No  Number falls in past yr: 0 0 - - -  Injury with Fall? 0 0 - - -    Functional Status Survey: Is the patient deaf or have difficulty hearing?: No Does the patient have difficulty seeing, even when wearing glasses/contacts?: No Does the patient have difficulty concentrating, remembering, or making decisions?: No Does the patient have difficulty walking or climbing stairs?: No Does the patient have difficulty dressing or bathing?: No Does the patient have difficulty doing errands alone such as visiting a doctor's office or shopping?: No   Assessment & Plan:    CPE completed today  . Prostate cancer screening and PSA options (with potential risks and benefits of testing vs not testing) were discussed along with recent recs/guidelines, shared decision making and handout/information given to pt today  . USPSTF grade A and B recommendations reviewed with  patient; age-appropriate recommendations, preventive care, screening tests, etc discussed and encouraged; healthy living encouraged; see AVS for patient education given to patient  . Discussed importance of 150 minutes of physical activity weekly, AHA exercise recommendations given to pt in AVS/handout  . Discussed importance of healthy diet:  eating lean meats and proteins, avoiding trans fats and saturated fats, avoid simple sugars and excessive carbs in diet, eat 6 servings of fruit/vegetables daily and drink plenty of water and avoid sweet beverages.  DASH diet reviewed if pt has HTN  . Recommended pt to do annual eye exam and routine dental exams/cleanings  Reviewed Health Maintenance: Health Maintenance  Topic Date Due  . INFLUENZA VACCINE  08/19/2019 (Originally 12/20/2018)  . TETANUS/TDAP  05/09/2025  . HIV Screening  Completed   Immunizations: Immunization History  Administered Date(s) Administered  . Influenza,inj,Quad PF,6+ Mos 02/13/2018  . Tdap 05/10/2015   1. Adult general medical exam - COMPLETE METABOLIC PANEL WITH GFR - Lipid panel - PSA - Hemoglobin A1c  2. Erectile dysfunction, unspecified erectile dysfunction type meds effective, no SE or concerns, refill given - sildenafil (REVATIO) 20 MG tablet; Take 2-3 tablets (40-60 mg total) by mouth daily as needed. Never combine with nitrates  Dispense: 30 tablet; Refill: 2  3. Urinary frequency R/o DM (unlikely), test urine, PSA for screening, handout given on urinary freq - encouraged to f/up if ever dysuria again - Microalbumin, urine - Hemoglobin A1c - Urinalysis, Routine w reflex microscopic - Urine Culture  4. Reactive airway disease without complication, unspecified asthma severity, unspecified whether persistent Mild intermittent - cannot use albuterol - refill given - ATROVENT HFA 17 MCG/ACT inhaler; Inhale 2 puffs into the lungs 4 (four) times daily as needed for wheezing.  Dispense: 1 Inhaler; Refill: 2   5. Cardiomyopathy, secondary [facility]) Per cardiology - currently doing well, no exertional sx  6. Prostate cancer screening PSA done  7. IBD (inflammatory bowel disease) Hx of, no current problems, still sx if he indulges in certain foods - Saw Dr. Allen Norris in the past (2017)  8. Hypertension goal BP (blood pressure) < 140/90  At goal today, Cardiology manages meds  9. Presence of cardiac defibrillator No recent events  10. H/O aortic valve replacement +murmur, pt states recently repaired a valve, pt due to f/up with cardiology  11. Dyslipidemia Discussed diet and exercise efforts to improve cholesterol, reviewed past ASCVD risk - COMPLETE METABOLIC PANEL WITH GFR - Lipid panel    Delsa Grana, PA-C 02/16/19 10:16 AM  [facility]

## 2019-02-17 ENCOUNTER — Other Ambulatory Visit: Payer: Self-pay

## 2019-02-17 DIAGNOSIS — J45909 Unspecified asthma, uncomplicated: Secondary | ICD-10-CM

## 2019-02-17 NOTE — Telephone Encounter (Signed)
Pt insurance covers these.

## 2019-02-18 LAB — MICROALBUMIN, URINE: Microalb, Ur: 0.5 mg/dL

## 2019-02-18 LAB — HEMOGLOBIN A1C
Hgb A1c MFr Bld: 4.5 % of total Hgb (ref <5.7)
Mean Plasma Glucose: 82 (calc)
eAG (mmol/L): 4.6 (calc)

## 2019-02-18 LAB — URINE CULTURE
MICRO NUMBER:: 929763
Result:: NO GROWTH
SPECIMEN QUALITY:: ADEQUATE

## 2019-02-18 LAB — URINALYSIS, ROUTINE W REFLEX MICROSCOPIC
Bilirubin Urine: NEGATIVE
Glucose, UA: NEGATIVE
Hgb urine dipstick: NEGATIVE
Ketones, ur: NEGATIVE
Leukocytes,Ua: NEGATIVE
Nitrite: NEGATIVE
Protein, ur: NEGATIVE
Specific Gravity, Urine: 1.014 (ref 1.001–1.03)
pH: <=5 (ref 5.0–8.0)

## 2019-02-18 LAB — PSA: PSA: 0.9 ng/mL (ref <=4.0)

## 2019-02-18 LAB — LIPID PANEL
Cholesterol: 174 mg/dL (ref <200)
HDL: 40 mg/dL (ref >=40)
LDL Cholesterol (Calc): 114 mg/dL (calc) — ABNORMAL HIGH
Non-HDL Cholesterol (Calc): 134 mg/dL (calc) — ABNORMAL HIGH (ref <130)
Total CHOL/HDL Ratio: 4.4 (calc) (ref <5.0)
Triglycerides: 102 mg/dL (ref <150)

## 2019-02-18 LAB — COMPLETE METABOLIC PANEL WITH GFR
AG Ratio: 1.5 (calc) (ref 1.0–2.5)
ALT: 27 U/L (ref 9–46)
AST: 20 U/L (ref 10–40)
Albumin: 4.3 g/dL (ref 3.6–5.1)
Alkaline phosphatase (APISO): 29 U/L — ABNORMAL LOW (ref 36–130)
BUN: 12 mg/dL (ref 7–25)
CO2: 30 mmol/L (ref 20–32)
Calcium: 9.3 mg/dL (ref 8.6–10.3)
Chloride: 105 mmol/L (ref 98–110)
Creat: 1.17 mg/dL (ref 0.60–1.35)
GFR, Est African American: 87 mL/min/{1.73_m2} (ref >=60)
GFR, Est Non African American: 75 mL/min/{1.73_m2} (ref >=60)
Globulin: 2.8 g/dL (calc) (ref 1.9–3.7)
Glucose, Bld: 95 mg/dL (ref 65–99)
Potassium: 4.5 mmol/L (ref 3.5–5.3)
Sodium: 141 mmol/L (ref 135–146)
Total Bilirubin: 0.8 mg/dL (ref 0.2–1.2)
Total Protein: 7.1 g/dL (ref 6.1–8.1)

## 2019-02-18 MED ORDER — INCRUSE ELLIPTA 62.5 MCG/INH IN AEPB: 1.0000 | INHALATION_SPRAY | Freq: Every day | RESPIRATORY_TRACT | 5 refills | Status: DC

## 2019-02-20 ENCOUNTER — Encounter: Payer: Self-pay | Admitting: Cardiology

## 2019-02-20 NOTE — Progress Notes (Signed)
Remote ICD transmission.   

## 2019-03-05 ENCOUNTER — Telehealth: Payer: Self-pay | Admitting: *Deleted

## 2019-03-05 NOTE — Telephone Encounter (Signed)
[employer] alert received for VT event on 03/04/19 at 23:00, treated with ATP x1, late break. Spoke with patient. He denies any symptoms with episode, was asleep at the time. Patient reports he may have missed a dose or two of medications recently, reports he "tries to keep up with them." Currently taking ranolazine 500mg  BID, carvedilol 12.5mg  BID, Entresto 49-51mg  BID, and mag ox 400mg  BID. Encouraged compliance with medications. Advised of [place] DMV driving restrictions x6 months. Will forward to Dr. Caryl Comes for review and recommendations. Pt verbalizes understanding of all instructions and denies additional questions or concerns at this time.

## 2019-03-05 NOTE — Telephone Encounter (Signed)
Patient made aware of no new recommendations at this time. No further questions.

## 2019-03-05 NOTE — Telephone Encounter (Signed)
Noted  Thankfully infrequent and paceterminated Will continue current meds and fu

## 2019-05-04 ENCOUNTER — Ambulatory Visit: Payer: 59 | Admitting: Family Medicine

## 2019-05-04 ENCOUNTER — Encounter: Payer: Self-pay | Admitting: Family Medicine

## 2019-05-04 ENCOUNTER — Other Ambulatory Visit: Payer: Self-pay

## 2019-05-04 VITALS — BP 124/78 | HR 78 | Temp 97.7°F | Resp 18 | Ht 70.0 in | Wt 220.5 lb

## 2019-05-04 DIAGNOSIS — L42 Pityriasis rosea: Secondary | ICD-10-CM

## 2019-05-04 MED ORDER — LORATADINE 5 MG PO CHEW: 5.0000 mg | CHEWABLE_TABLET | Freq: Every day | ORAL | 0 refills | Status: DC | PRN

## 2019-05-04 MED ORDER — TRIAMCINOLONE ACETONIDE 0.1 % EX CREA: 1.0000 "application " | TOPICAL_CREAM | Freq: Two times a day (BID) | CUTANEOUS | 0 refills | Status: DC

## 2019-05-04 NOTE — Patient Instructions (Addendum)
-   Pramoxine Cream (Aveeno Anti-itch) - Restart your Claritin just once daily until the rash is improving.

## 2019-05-04 NOTE — Progress Notes (Signed)
Name: [name]   MRN: [id number]    DOB: [birth year]   Date:05/04/2019       Progress Note  Subjective  Chief Complaint  Chief Complaint  Patient presents with  . Rash    on chest, shoulder, back for 1 week    HPI  PT presents with new onset rash for about a week.  He has 1 larger patch on the right upper back/shoulder that presented about a week ago, then additional lesions started to present on the right upper chest and right upper arm.  Mild itching and soreness; no burning/tingling; no fevers/chills, body aches, fatigue, chest pain, shortness of breath, myalgias, or arthralgias.  No changes to any topical products, soaps, detergents, no new medications; no history of shingles.   Patient Active Problem List   Diagnosis Date Noted  . Epigastric pain 05/09/2017  . Lipoma of buttock 01/11/2017  . ED (erectile dysfunction) 12/01/2016  . Syncope 05/16/2016  . Sustained ventricular tachycardia ([place]) 05/16/2016  . Ventricular tachycardia ([place]) 05/16/2016  . Prostate cancer screening 12/22/2015  . Hearing loss of both ears 12/22/2015  . Abnormal findings-gastrointestinal tract   . IBD (inflammatory bowel disease) 09/23/2015  . Pain in lower back 09/15/2015  . Night sweats 09/15/2015  . Groin pain 07/29/2015  . Screening for STD (sexually transmitted disease) 07/29/2015  . Preventative health care 05/10/2015  . Presence of cardiac defibrillator 05/10/2015  . Hypertension goal BP (blood pressure) < 140/90 12/13/2011  . H/O aortic valve replacement 08/08/2009  . Cardiomyopathy, secondary ([place]) 03/09/2009  . History of ventricular tachycardia 03/09/2009    Social History   Tobacco Use  . Smoking status: Never Smoker  . Smokeless tobacco: Never Used  Substance Use Topics  . Alcohol use: No     Current Outpatient Medications:  .  carvedilol (COREG) 12.5 MG tablet, TAKE 1 TABLET TWICE A DAY, Disp: 180 tablet, Rfl: 2 .  fluticasone (FLONASE) 50 MCG/ACT nasal spray,  Place 2 sprays into both nostrils daily. (Patient taking differently: Place 2 sprays into both nostrils as needed. ), Disp: 16 g, Rfl: 2 .  loratadine (CLARITIN) 5 MG chewable tablet, Chew 5 mg by mouth daily as needed for allergies., Disp: , Rfl:  .  magnesium oxide (MAG-OX) 400 (241.3 Mg) MG tablet, Take 1 tablet (400 mg total) by mouth 2 (two) times daily., Disp: 60 tablet, Rfl: 0 .  Multiple Vitamin (MULTIVITAMIN) tablet, Take 1 tablet by mouth daily., Disp: , Rfl:  .  ondansetron (ZOFRAN ODT) 4 MG disintegrating tablet, Take 1 tablet (4 mg total) by mouth every 8 (eight) hours as needed for nausea or vomiting., Disp: 20 tablet, Rfl: 0 .  promethazine (PHENERGAN) 25 MG tablet, Take 0.5-1 tablets (12.5-25 mg total) by mouth every 6 (six) hours as needed for nausea or vomiting., Disp: 20 tablet, Rfl: 0 .  ranolazine (RANEXA) 500 MG 12 hr tablet, TAKE 1 TABLET TWICE A DAY, Disp: 180 tablet, Rfl: 4 .  sacubitril-valsartan (ENTRESTO) 49-51 MG, Take 1 tablet by mouth 2 (two) times daily., Disp: 180 tablet, Rfl: 3 .  sildenafil (REVATIO) 20 MG tablet, Take 2-3 tablets (40-60 mg total) by mouth daily as needed. Never combine with nitrates, Disp: 30 tablet, Rfl: 2 .  spironolactone (ALDACTONE) 25 MG tablet, TAKE 1/2 TABLET BY MOUTH EVERY DAY, Disp: 45 tablet, Rfl: 3 .  umeclidinium bromide (INCRUSE ELLIPTA) 62.5 MCG/INH AEPB, Inhale 1 puff into the lungs daily., Disp: 30 each, Rfl: 5 .  tiZANidine (  ZANAFLEX) 4 MG capsule, Take at bedtime, if it does not cause drowsiness you can take it up to every 8 hours. (Patient not taking: Reported on 02/16/2019), Disp: 30 capsule, Rfl: 0  Allergies  Allergen Reactions  . Shellfish Allergy Nausea And Vomiting    I personally reviewed active problem list, medication list, allergies, notes from last encounter, lab results with the patient/caregiver today.  ROS  Ten systems reviewed and is negative except as mentioned in HPI  Objective  Vitals:   05/04/19 1404   BP: 124/78  Pulse: 78  Resp: 18  Temp: 97.7 F (36.5 C)  TempSrc: Temporal  SpO2: 94%  Weight: 220 lb 8 oz (100 kg)  Height: 5\' 10"  (1.778 m)    Body mass index is 31.64 kg/m.  Nursing Note and Vital Signs reviewed.  Physical Exam  Constitutional: Patient appears well-developed and well-nourished. No distress.  HENT: Head: Normocephalic and atraumatic. Ears: bilateral TMs with no erythema or effusion; Nose: Nose normal. Mouth/Throat: Oropharynx is clear and moist. No oropharyngeal exudate or tonsillar swelling.  Eyes: Conjunctivae and EOM are normal. No scleral icterus.  Pupils are equal, round, and reactive to light.  Neck: Normal range of motion. Neck supple. No JVD present. No thyromegaly present.  Cardiovascular: Normal rate, regular rhythm and normal heart sounds.  No murmur heard. No BLE edema. Pulmonary/Chest: Effort normal and breath sounds normal. No respiratory distress. Abdominal: Soft. Bowel sounds are normal, no distension. There is no tenderness. No masses. Musculoskeletal: Normal range of motion, no joint effusions. No gross deformities Neurological: Pt is alert and oriented to person, place, and time. No cranial nerve deficit. Coordination, balance, strength, speech and gait are normal.  Skin: Skin is warm and dry. There is a large, somewhat raised patch with border of erythema and faded erythema with some central clearing to the right upper back/shoulder; there are about 10 flat erythematous lesions that are non-tender to the right upper chest and right upper anterior arm.  Psychiatric: Patient has a normal mood and affect. behavior is normal. Judgment and thought content normal.  No results found for this or any previous visit (from the past 72 hour(s)).  Assessment & Plan  1. Pityriasis rosea - loratadine (CLARITIN) 5 MG chewable tablet; Chew 1 tablet (5 mg total) by mouth daily as needed for allergies.  Dispense: 30 tablet; Refill: 0 - triamcinolone cream  (KENALOG) 0.1 %; Apply 1 application topically 2 (two) times daily.  Dispense: 30 g; Refill: 0 - Suspect Pityriasis rosea, did discuss possibility that this could be atypical shingles or other exanthem as well.  Will monitor for worsening or not improving over the next 1-2 weeks.  -Red flags and when to present for emergency care or RTC including fever >101.43F, chest pain, shortness of breath, new/worsening/un-resolving symptoms, reviewed with patient at time of visit. Follow up and care instructions discussed and provided in AVS.

## 2019-05-14 ENCOUNTER — Ambulatory Visit (INDEPENDENT_AMBULATORY_CARE_PROVIDER_SITE_OTHER): Payer: 59 | Admitting: *Deleted

## 2019-05-14 DIAGNOSIS — I429 Cardiomyopathy, unspecified: Secondary | ICD-10-CM | POA: Diagnosis not present

## 2019-05-15 LAB — CUP PACEART REMOTE DEVICE CHECK
Battery Remaining Longevity: 96 mo
Brady Statistic RV Percent Paced: 0 %
Date Time Interrogation Session: 20201224062147
HighPow Impedance: 52 Ohm
Implantable Lead Implant Date: 20080214
Implantable Lead Location: 753860
Implantable Lead Model: 185
Implantable Lead Serial Number: 178017
Implantable Pulse Generator Implant Date: 20141216
Lead Channel Impedance Value: 594 Ohm
Lead Channel Sensing Intrinsic Amplitude: 17.8 mV
Lead Channel Setting Pacing Amplitude: 2.4 V
Lead Channel Setting Pacing Pulse Width: 0.4 ms
Lead Channel Setting Sensing Sensitivity: 0.6 mV
Pulse Gen Serial Number: 126781

## 2019-06-19 ENCOUNTER — Telehealth: Payer: Self-pay

## 2019-06-19 NOTE — Telephone Encounter (Signed)
ICD alert received for ATP therapy received 06/18/19 1951.  VT episode, treated with ATPx1 with late break.  Pt has hx of treated VT, most recent episode was 03/05/19 with episodes of NSVT since.      Spoke with pt, he reports he was symptomatic at time- felt "off" like he needed to lay down/ fatigued.  Also felt muscle spasms in chest.  Pt asymptomatic at time of call.  Pt currently prescribed- Ranolazine 500mg  BID, Carvedilol 12.5mg  BID, Entresto 49-51 mg BID and Mag Ox 400mg  BID, Spironolactone 12.5mg  daily.  Pt does report forgetting to take his medications for the morning at least once maybe twice in the last few days.   Encouraged medication compliance. Educated pt on driving restrictions x6 months r/t therapy received.  Advised will forward to Dr. Caryl Comes for review and recommendations.  Next scheduled OV is 08/04/19.

## 2019-06-23 NOTE — Telephone Encounter (Signed)
Reviewed with Dr. Caryl Comes.  He would like the patient to have a sooner appt here or [place].  He can do in person or telehealth per Dr. Caryl Comes.  He is currently scheduled for 3/16 with Dr. Caryl Comes.  Will forward to scheduling to please reach out to the patient to discuss sooner follow up.  We can add him on 07/07/19 at 8:20 am in [place].

## 2019-06-23 NOTE — Telephone Encounter (Signed)
All done, he is scheduled for 2/16

## 2019-07-02 ENCOUNTER — Other Ambulatory Visit: Payer: Self-pay | Admitting: Internal Medicine

## 2019-07-07 ENCOUNTER — Ambulatory Visit (INDEPENDENT_AMBULATORY_CARE_PROVIDER_SITE_OTHER): Payer: 59 | Admitting: Internal Medicine

## 2019-07-07 ENCOUNTER — Encounter: Payer: Self-pay | Admitting: Internal Medicine

## 2019-07-07 ENCOUNTER — Other Ambulatory Visit: Payer: Self-pay

## 2019-07-07 VITALS — BP 112/76 | HR 81 | Ht 70.0 in | Wt 221.2 lb

## 2019-07-07 DIAGNOSIS — I493 Ventricular premature depolarization: Secondary | ICD-10-CM

## 2019-07-07 DIAGNOSIS — I472 Ventricular tachycardia, unspecified: Secondary | ICD-10-CM

## 2019-07-07 DIAGNOSIS — Z95810 Presence of automatic (implantable) cardiac defibrillator: Secondary | ICD-10-CM

## 2019-07-07 DIAGNOSIS — Z79899 Other long term (current) drug therapy: Secondary | ICD-10-CM

## 2019-07-07 DIAGNOSIS — I359 Nonrheumatic aortic valve disorder, unspecified: Secondary | ICD-10-CM

## 2019-07-07 DIAGNOSIS — I429 Cardiomyopathy, unspecified: Secondary | ICD-10-CM

## 2019-07-07 MED ORDER — RANOLAZINE ER 1000 MG PO TB12: 1000.0000 mg | ORAL_TABLET | Freq: Two times a day (BID) | ORAL | 3 refills | Status: DC

## 2019-07-07 NOTE — Patient Instructions (Addendum)
Medication Instructions:  - Your physician has recommended you make the following change in your medication:   1) Increase Ranexa to 1000 mg- take 1 tablet by mouth TWICE daily  *If you need a refill on your cardiac medications before your next appointment, please call your pharmacy*  Lab Work: - Your physician recommends that you have lab work: BMP/ Magnesium  - Please come back to the [place] at [facility], 1st desk on the right  - Lab orders (Monday-Friday) 7:30 am- 5:30 pm  If you have labs (blood work) drawn today and your tests are completely normal, you will receive your results only by: Marland Kitchen MyChart Message (if you have MyChart) OR . A paper copy in the mail If you have any lab test that is abnormal or we need to change your treatment, we will call you to review the results.  Testing/Procedures: - none ordered  Follow-Up: At [facility], you and your health needs are our priority.  As part of our continuing mission to provide you with exceptional heart care, we have created designated Provider Care Teams.  These Care Teams include your primary Cardiologist (physician) and Advanced Practice Providers (APPs -  Physician Assistants and Nurse Practitioners) who all work together to provide you with the care you need, when you need it.  Your next appointment:   6 month(s)  The format for your next appointment:   In Person  Provider:   Virl Axe, MD  Other Instructions - Dr. Caryl Comes will reach out to Dr. Clydene Laming at [facility] to see if you are a possible candidate for ablation.

## 2019-07-07 NOTE — Progress Notes (Signed)
Patient Care Team: Delsa Grana, PA-C as PCP - General (Family Medicine) Sanda Mischelle Reeg Satira Anis, MD as Attending Physician (Family Medicine) Deboraha Sprang, MD as Consulting Physician (Cardiology)   HPI  [name] is a 44 y.o. male Seen in followup for ventricular tachycardia occurring in the context of surgically corrected congenital heart disease With Aortic valve replacement.  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  My note from 2011 reports that at the time of surgery "he had damage to his right coronary artery and underwent repair." He is status post ICD implantation. 4/13 had appropriate shock for fast ventricular tachycardia He underwent device generator replacement 12/14    DATE TEST    3/13    Echo   EF 40-45 % IP akinesis   1/15    TEE   EF 60 % "mechanical aortic valve"  2/17 Echo 45-50%   12/17 Echo EF 35-40 "bioprosthesis "  5/17 Echo EF 30-35%   8/18 CT EF 30-35 RCA Total without flow//aneurysm Inferior Wall LAD & CX w FFR > 80%   12/17  Admitted [facility] for syncopal VT- PM  Antecedent VT PM in wake of viral illness  Ranexa and MAG added  He has had interval VTNS monomorphic and polymorphic   Antiarrhythmics Date Reason stopped  Ranolazine 12/17     Date Cr K  12/18 1.24 4.3   11/19 1.14 3.8  9/20 1.17 4.5      DATE TEST    6/18    HOLTER <1 % PVCs      2/20had the abrupt onset of presyncope prompting him to sit on the floor and about 15 seconds the episode had resolved.  Device interrogation demonstrates rapid monomorphic VT with acceleration with ATP and then termination spontaneously at the moment of charging being completed.  The shock was diverted.  Seen today for recurrent VT-fast with successful ATP(one terminated cleanly the other "dirty")   Had symptoms of fatigue before and after work.  Had "seizure-like activities"  no loss of  consciousness.  Lives with the fear of the next episode of VT.        Past Medical History:  Diagnosis Date  . Anxiety   . Aortic valve replaced    Requiring replacement, specifics not available  . Hearing loss of both ears 12/22/2015  . Heart murmur   . Hypertension   . IBD (inflammatory bowel disease) 09/23/2015  . ICD (implantable cardiac defibrillator), BSX single   . Obesity 12/22/2015  . Secondary cardiomyopathy ([place])   . Syncope   . Ventricular septal defect   . Ventricular tachycardia ([place])    appropriate VT shock therapy /13    Past Surgical History:  Procedure Laterality Date  . ANGIOPLASTY     RCA repair with vein angioplasty following injury with the aforementioned surgery  . AORTIC VALVE REPLACEMENT     Bioprosthesis  . CARDIAC DEFIBRILLATOR PLACEMENT    . CARDIAC VALVE REPLACEMENT    . CHOLECYSTECTOMY    . COLONOSCOPY WITH PROPOFOL N/A 12/06/2015   Procedure: COLONOSCOPY WITH PROPOFOL;  Surgeon: [name], MD;  Location: [facility] ENDOSCOPY;  Service: Endoscopy;  Laterality: N/A;  . CORONARY ARTERY BYPASS GRAFT    . IMPLANTABLE CARDIOVERTER DEFIBRILLATOR GENERATOR CHANGE N/A 05/05/2013   Procedure: IMPLANTABLE CARDIOVERTER DEFIBRILLATOR GENERATOR CHANGE;  Surgeon: Deboraha Sprang, MD;  Location: [facility] CATH LAB;  Service: Cardiovascular;  Laterality: N/A;  . INSERT / REPLACE / REMOVE PACEMAKER    . VSD REPAIR      Current Outpatient Medications  Medication Sig Dispense Refill  . carvedilol (COREG) 12.5 MG tablet TAKE 1 TABLET TWICE A DAY 180 tablet 2  . fluticasone (FLONASE) 50 MCG/ACT nasal spray Place 2 sprays into both nostrils daily. (Patient taking differently: Place 2 sprays into both nostrils as needed. ) 16 g 2  . loratadine (CLARITIN) 5 MG chewable tablet Chew 1 tablet (5 mg total) by mouth daily as needed for allergies. 30 tablet 0  . magnesium oxide (MAG-OX) 400 (241.3 Mg) MG tablet Take 1 tablet (400 mg total) by mouth 2 (two) times daily. 60 tablet 0  .  Multiple Vitamin (MULTIVITAMIN) tablet Take 1 tablet by mouth daily.    . ondansetron (ZOFRAN ODT) 4 MG disintegrating tablet Take 1 tablet (4 mg total) by mouth every 8 (eight) hours as needed for nausea or vomiting. 20 tablet 0  . promethazine (PHENERGAN) 25 MG tablet Take 0.5-1 tablets (12.5-25 mg total) by mouth every 6 (six) hours as needed for nausea or vomiting. 20 tablet 0  . ranolazine (RANEXA) 500 MG 12 hr tablet TAKE 1 TABLET TWICE A DAY 180 tablet 4  . sacubitril-valsartan (ENTRESTO) 49-51 MG Take 1 tablet by mouth 2 (two) times daily. 180 tablet 3  . sildenafil (REVATIO) 20 MG tablet Take 2-3 tablets (40-60 mg total) by mouth daily as needed. Never combine with nitrates 30 tablet 2  . spironolactone (ALDACTONE) 25 MG tablet TAKE ONE-HALF (1/2) TABLET DAILY 45 tablet 3  . tiZANidine (ZANAFLEX) 4 MG capsule Take at bedtime, if it does not cause drowsiness you can take it up to every 8 hours. 30 capsule 0  . triamcinolone cream (KENALOG) 0.1 % Apply 1 application topically 2 (two) times daily. 30 g 0  . umeclidinium bromide (INCRUSE ELLIPTA) 62.5 MCG/INH AEPB Inhale 1 puff into the lungs daily. 30 each 5   No current facility-administered medications for this visit.    Allergies  Allergen Reactions  . Shellfish Allergy Nausea And Vomiting    Review of Systems negative except from HPI and PMH  Physical Exam BP 112/76 (BP Location: Left Arm, Patient Position: Sitting, Cuff Size: Normal)   Pulse 81   Ht 5\' 10"  (1.778 m)   Wt 221 lb 4 oz (100.4 kg)   BMI 31.75 kg/m  Well developed and well nourished in no acute distress HENT normal Neck supple with JVP-flat Clear Device pocket well healed; without hematoma or erythema.  There is no tethering  Regular rate and rhythm, 2/6 murmur Abd-soft with active BS No Clubbing cyanosis  edema Skin-warm and dry A & Oriented  Grossly normal sensory and motor function  ECG sinus at 81 Interval 17/12/40  Assessment and  plan  Ventricular tachycardia-recurrent  Hypertension   Aortic valve-bioprosthesis   ICD-[employer]  The patient's device was interrogated.  The information was reviewed. No changes were made in the programming.     Cardiomyopathy-hypertrophic/ischemic-secondary  PVCs-infrequent  RCA No flow, presumed 2/2 surgery As above   Intercurrent ventricular tachycardia.  Increasingly frequent.  Lives with the "sword of Damocles"  Discussed treatment options including continuing current strategies, antiarrhythmic drug or catheter ablation.  He has had recurrent monomorphic albeit fast ventricular tachycardia since initiation of her normal exam; has had no recurrent polymorphic ventricular tachycardia.  He is inclined towards catheter ablation given the impact that the recurrences are having  on his quality of life and the fear of the recurrences as well.  We discussed again his previously expressed desire to get seen at [facility].  I will reach out to Dr. Lurene Shadow and see if we can arrange consultation.  We will be available to follow-up either for procedure, device or none of the above depending on how he would like to proceed after he meets the [facility] team.  For now we will increase his ranolazine.  He is anxious about the potential for carvedilol to aggravate his fatigue.  Blood pressure is much better controlled.

## 2019-07-10 ENCOUNTER — Telehealth: Payer: Self-pay | Admitting: Internal Medicine

## 2019-07-10 NOTE — Telephone Encounter (Signed)
Pt c/o medication issue:  1. Name of Medication: Ranexa  2. How are you currently taking this medication (dosage and times per day)? 1000 mg one in am and 1 in pm  3. Are you having a reaction (difficulty breathing--STAT)? Light headedness, dizzy ness, heart pounding   4. What is your medication issue? Wants to know if side effects are normal   Please advise

## 2019-07-10 NOTE — Telephone Encounter (Signed)
Messaged Dr. Caryl Comes by phone to inquire his thoughts on the possible side effects the patient is experiencing with his increased Ranexa dose.  I am unsure if these are a side effect of the medication. The patient had recent VT with ATP.  I attempted to call the patient to clarify. No answer- I left him a message that I was trying to get feedback from Dr. Caryl Comes and that Dr. Caryl Comes may try to reach out to him over the weekend, but if not, to please call the office on Monday morning to followup.  Dr. Caryl Comes- did call me back after I hung up with the patient.  He advised could cut back the Ranexa to 500 mg BID. He will try to reach out to the patient himself.

## 2019-07-13 ENCOUNTER — Other Ambulatory Visit: Payer: Self-pay | Admitting: Internal Medicine

## 2019-07-13 NOTE — Telephone Encounter (Signed)
Called and spoke to patient over the weekend  was feeling better and has appt at [facility] today

## 2019-07-14 NOTE — Telephone Encounter (Signed)
Per Dr. Caryl Comes, he spoke with the patient over the weekend. He was scheduled to follow up at [facility] yesterday.

## 2019-08-04 ENCOUNTER — Encounter: Payer: 59 | Admitting: Internal Medicine

## 2019-08-13 ENCOUNTER — Ambulatory Visit (INDEPENDENT_AMBULATORY_CARE_PROVIDER_SITE_OTHER): Payer: 59 | Admitting: *Deleted

## 2019-08-13 DIAGNOSIS — I428 Other cardiomyopathies: Secondary | ICD-10-CM | POA: Diagnosis not present

## 2019-08-13 LAB — CUP PACEART REMOTE DEVICE CHECK
Battery Remaining Longevity: 78 mo
Battery Remaining Percentage: 83 %
Brady Statistic RV Percent Paced: 0 %
Date Time Interrogation Session: 20210325115400
HighPow Impedance: 54 Ohm
Implantable Lead Implant Date: 20080214
Implantable Lead Location: 753860
Implantable Lead Model: 185
Implantable Lead Serial Number: 178017
Implantable Pulse Generator Implant Date: 20141216
Lead Channel Impedance Value: 615 Ohm
Lead Channel Pacing Threshold Amplitude: 1.1 V
Lead Channel Pacing Threshold Pulse Width: 0.4 ms
Lead Channel Setting Pacing Amplitude: 2.4 V
Lead Channel Setting Pacing Pulse Width: 0.4 ms
Lead Channel Setting Sensing Sensitivity: 0.6 mV
Pulse Gen Serial Number: 126781

## 2019-08-14 NOTE — Progress Notes (Signed)
ICD Remote  

## 2019-08-20 MED ORDER — RANOLAZINE ER 1000 MG PO TB12: 1000.0000 mg | ORAL_TABLET | Freq: Two times a day (BID) | ORAL | 1 refills | Status: DC

## 2019-08-21 ENCOUNTER — Other Ambulatory Visit: Payer: Self-pay

## 2019-11-12 ENCOUNTER — Ambulatory Visit (INDEPENDENT_AMBULATORY_CARE_PROVIDER_SITE_OTHER): Payer: 59 | Admitting: *Deleted

## 2019-11-12 DIAGNOSIS — I429 Cardiomyopathy, unspecified: Secondary | ICD-10-CM

## 2019-11-12 LAB — CUP PACEART REMOTE DEVICE CHECK
Battery Remaining Longevity: 84 mo
Battery Remaining Percentage: 86 %
Brady Statistic RV Percent Paced: 0 %
Date Time Interrogation Session: 20210624030100
HighPow Impedance: 53 Ohm
Implantable Lead Implant Date: 20080214
Implantable Lead Location: 753860
Implantable Lead Model: 185
Implantable Lead Serial Number: 178017
Implantable Pulse Generator Implant Date: 20141216
Lead Channel Impedance Value: 581 Ohm
Lead Channel Pacing Threshold Amplitude: 1 V
Lead Channel Pacing Threshold Pulse Width: 0.4 ms
Lead Channel Setting Pacing Amplitude: 2 V
Lead Channel Setting Pacing Pulse Width: 0.4 ms
Lead Channel Setting Sensing Sensitivity: 0.6 mV
Pulse Gen Serial Number: 126781

## 2019-11-13 NOTE — Progress Notes (Signed)
Remote ICD transmission.   

## 2019-12-03 ENCOUNTER — Other Ambulatory Visit: Payer: Self-pay | Admitting: Internal Medicine

## 2020-01-01 ENCOUNTER — Encounter: Payer: Self-pay | Admitting: Family Medicine

## 2020-01-01 ENCOUNTER — Telehealth: Payer: Self-pay

## 2020-01-01 NOTE — Telephone Encounter (Signed)
[place] alert-  VT episode on 12/30/19 at 2004, treated successfully with ATP.   Current meds include-Carvedilol 12.5mg  BID   Spoke with pt, he was aware of event happening stated he was engage in non-strenuous activity when he felt a "thump" in his heart.    Pt denies any cardiac symptoms at this time.    Pt scheduled for appt with Dr. Caryl Comes on 8/17, encouraged pt to keep appt and educated on DMV driving restrictions for 6 months following treated episode.

## 2020-01-02 NOTE — Telephone Encounter (Signed)
Noted  

## 2020-01-04 ENCOUNTER — Telehealth: Payer: Self-pay | Admitting: Internal Medicine

## 2020-01-04 MED ORDER — AMOXICILLIN 500 MG PO TABS: 2000.0000 mg | ORAL_TABLET | Freq: Once | ORAL | 0 refills | Status: AC

## 2020-01-04 NOTE — Telephone Encounter (Signed)
Patient states he is having dental work tomorrow and needs his pre medication. [name]

## 2020-01-04 NOTE — Telephone Encounter (Signed)
Please send Rx for Amoxicillin 2gm to take 30-60 minutes before dental procedure to prefer pharmacy.

## 2020-01-04 NOTE — Telephone Encounter (Signed)
This patient has a dental procedure tomorrow can you please recommend pre procedure antibiotic regimen. He has H/O aortic valve replacement.  Thank you

## 2020-01-04 NOTE — Telephone Encounter (Signed)
Patient calling to check on status - in need of an antibiotic for dental work tomorrow

## 2020-01-04 NOTE — Telephone Encounter (Signed)
Can you please recommend antibiotic regimen for this patient with  H/O aortic valve replacement who is having dental work done tomorrow?

## 2020-01-04 NOTE — Telephone Encounter (Signed)
Called patient to inform him that I sent the prescription to his requested pharmacy. Patient verbalized understanding and was grateful for the call.

## 2020-01-04 NOTE — Addendum Note (Signed)
Addended by: Kavin Leech on: 01/04/2020 01:59 PM   Modules accepted: Orders

## 2020-01-05 ENCOUNTER — Ambulatory Visit: Payer: 59 | Admitting: Internal Medicine

## 2020-01-05 ENCOUNTER — Other Ambulatory Visit: Payer: Self-pay

## 2020-01-05 ENCOUNTER — Encounter: Payer: Self-pay | Admitting: Internal Medicine

## 2020-01-05 VITALS — BP 116/60 | HR 72 | Ht 70.0 in | Wt 222.0 lb

## 2020-01-05 DIAGNOSIS — I428 Other cardiomyopathies: Secondary | ICD-10-CM

## 2020-01-05 DIAGNOSIS — I472 Ventricular tachycardia, unspecified: Secondary | ICD-10-CM

## 2020-01-05 DIAGNOSIS — I359 Nonrheumatic aortic valve disorder, unspecified: Secondary | ICD-10-CM | POA: Diagnosis not present

## 2020-01-05 DIAGNOSIS — Z95810 Presence of automatic (implantable) cardiac defibrillator: Secondary | ICD-10-CM

## 2020-01-05 LAB — PACEMAKER DEVICE OBSERVATION

## 2020-01-05 MED ORDER — RANOLAZINE ER 1000 MG PO TB12: 1000.0000 mg | ORAL_TABLET | Freq: Two times a day (BID) | ORAL | 3 refills | Status: DC

## 2020-01-05 MED ORDER — ENTRESTO 49-51 MG PO TABS: 1.0000 | ORAL_TABLET | Freq: Two times a day (BID) | ORAL | 1 refills | Status: DC

## 2020-01-05 MED ORDER — CARVEDILOL 12.5 MG PO TABS: 12.5000 mg | ORAL_TABLET | Freq: Two times a day (BID) | ORAL | 3 refills | Status: DC

## 2020-01-05 NOTE — Patient Instructions (Addendum)
Medication Instructions:  - Your physician recommends that you continue on your current medications as directed. Please refer to the Current Medication list given to you today.  *If you need a refill on your cardiac medications before your next appointment, please call your pharmacy*   Lab Work: - none ordered  If you have labs (blood work) drawn today and your tests are completely normal, you will receive your results only by: Marland Kitchen MyChart Message (if you have MyChart) OR . A paper copy in the mail If you have any lab test that is abnormal or we need to change your treatment, we will call you to review the results.   Testing/Procedures: -Your physician has requested that you have an echocardiogram. Echocardiography is a painless test that uses sound waves to create images of your heart. It provides your doctor with information about the size and shape of your heart and how well your heart's chambers and valves are working. This procedure takes approximately one hour. There are no restrictions for this procedure.     Follow-Up: At [facility], you and your health needs are our priority.  As part of our continuing mission to provide you with exceptional heart care, we have created designated Provider Care Teams.  These Care Teams include your primary Cardiologist (physician) and Advanced Practice Providers (APPs -  Physician Assistants and Nurse Practitioners) who all work together to provide you with the care you need, when you need it.  We recommend signing up for the patient portal called "MyChart".  Sign up information is provided on this After Visit Summary.  MyChart is used to connect with patients for Virtual Visits (Telemedicine).  Patients are able to view lab/test results, encounter notes, upcoming appointments, etc.  Non-urgent messages can be sent to your provider as well.   To learn more about what you can do with MyChart, go to [email or web address].    Your next  appointment:   6 month(s)  The format for your next appointment:   In Person  Provider:   Virl Axe, MD   Other Instructions   Echocardiogram An echocardiogram is a procedure that uses painless sound waves (ultrasound) to produce an image of the heart. Images from an echocardiogram can provide important information about:  Signs of coronary artery disease (CAD).  Aneurysm detection. An aneurysm is a weak or damaged part of an artery wall that bulges out from the normal force of blood pumping through the body.  Heart size and shape. Changes in the size or shape of the heart can be associated with certain conditions, including heart failure, aneurysm, and CAD.  Heart muscle function.  Heart valve function.  Signs of a past heart attack.  Fluid buildup around the heart.  Thickening of the heart muscle.  A tumor or infectious growth around the heart valves. Tell a health care provider about:  Any allergies you have.  All medicines you are taking, including vitamins, herbs, eye drops, creams, and over-the-counter medicines.  Any blood disorders you have.  Any surgeries you have had.  Any medical conditions you have.  Whether you are pregnant or may be pregnant. What are the risks? Generally, this is a safe procedure. However, problems may occur, including:  Allergic reaction to dye (contrast) that may be used during the procedure. What happens before the procedure? No specific preparation is needed. You may eat and drink normally. What happens during the procedure?   An IV tube may be inserted into one of your veins.  You may receive contrast through this tube. A contrast is an injection that improves the quality of the pictures from your heart.  A gel will be applied to your chest.  A wand-like tool (transducer) will be moved over your chest. The gel will help to transmit the sound waves from the transducer.  The sound waves will harmlessly bounce off of  your heart to allow the heart images to be captured in real-time motion. The images will be recorded on a computer. The procedure may vary among health care providers and hospitals. What happens after the procedure?  You may return to your normal, everyday life, including diet, activities, and medicines, unless your health care provider tells you not to do that. Summary  An echocardiogram is a procedure that uses painless sound waves (ultrasound) to produce an image of the heart.  Images from an echocardiogram can provide important information about the size and shape of your heart, heart muscle function, heart valve function, and fluid buildup around your heart.  You do not need to do anything to prepare before this procedure. You may eat and drink normally.  After the echocardiogram is completed, you may return to your normal, everyday life, unless your health care provider tells you not to do that. This information is not intended to replace advice given to you by your health care provider. Make sure you discuss any questions you have with your health care provider. Document Revised: 08/28/2018 Document Reviewed: 06/09/2016 Elsevier Patient Education  [place].

## 2020-01-05 NOTE — Progress Notes (Signed)
Patient Care Team: Delsa Grana, PA-C as PCP - General (Family Medicine) Sanda Soledad Budreau Satira Anis, MD as Attending Physician (Family Medicine) Deboraha Sprang, MD as Consulting Physician (Cardiology)   HPI  [name] is a 44 y.o. male Seen in followup for ventricular tachycardia occurring in the context of surgically corrected congenital heart disease With Aortic valve replacement.  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  My note from 2011 reports that at the time of surgery "he had damage to his right coronary artery and underwent repair." He is status post ICD implantation. 4/13 had appropriate shock for fast ventricular tachycardia He underwent device generator replacement 12/14. 12/17  Admitted [facility] for syncopal VT- PM  Antecedent VT PM in wake of viral illness  Ranexa and MAG added He has had interval VTNS monomorphic and polymorphic   Has developed AFib-paroxysmal   Referred 2/21 to [facility] -briefly summarized as normal endocardial voltage.  Thought to be epicardial.  Mapping of the CS was complicated by dissection.  VTs were hemodynamically unstable and " Given the inability to activation map due to hemodynamically unstable VT and inability to access the epicardial space, the case was concluded at this time.   DATE TEST EF   3/13    Echo 40-45 % IP akinesis   1/15    TEE  60 % "mechanical aortic valve"  2/17 Echo 45-50%   12/17 Echo  35-40% "bioprosthesis "  5/17 Echo  30-35%   8/18 CT 30-35% RCA Total without flow//aneurysm Inferior Wall LAD & CX w FFR > 80%       Antiarrhythmics Date Reason stopped  Ranolazine 12/17     Date Cr K  12/18 1.24 4.3   11/19 1.14 3.8  9/20 1.17 4.5              DATE TEST    6/18    HOLTER <1 % PVCs      2/20had the abrupt onset of presyncope prompting him to sit on the floor and about 15 seconds the  episode had resolved.  Device interrogation demonstrates rapid monomorphic VT with acceleration with ATP and then termination spontaneously at the moment of charging being completed.  The shock was diverted.  Seen today for recurrent VT-fast with successful ATP(one terminated cleanly the other "dirty")   Had symptoms of fatigue before and after work.  Had "seizure-like activities"  no loss of consciousness.  Lives with the fear of the next episode of VT.        Past Medical History:  Diagnosis Date  . Anxiety   . Aortic valve replaced    Requiring replacement, specifics not available  . Hearing loss of both ears 12/22/2015  . Heart murmur   . Hypertension   . IBD (inflammatory bowel disease) 09/23/2015  . ICD (implantable cardiac defibrillator), BSX single   . Obesity 12/22/2015  . Secondary cardiomyopathy ([place])   . Syncope   . Ventricular septal defect   . Ventricular tachycardia ([place])    appropriate VT shock therapy /13    Past Surgical History:  Procedure Laterality Date  . ANGIOPLASTY     RCA repair with vein angioplasty following injury with the aforementioned surgery  . AORTIC VALVE REPLACEMENT     Bioprosthesis  . CARDIAC DEFIBRILLATOR PLACEMENT    . CARDIAC VALVE REPLACEMENT    . CHOLECYSTECTOMY    .  COLONOSCOPY WITH PROPOFOL N/A 12/06/2015   Procedure: COLONOSCOPY WITH PROPOFOL;  Surgeon: [name], MD;  Location: [facility] ENDOSCOPY;  Service: Endoscopy;  Laterality: N/A;  . CORONARY ARTERY BYPASS GRAFT    . IMPLANTABLE CARDIOVERTER DEFIBRILLATOR GENERATOR CHANGE N/A 05/05/2013   Procedure: IMPLANTABLE CARDIOVERTER DEFIBRILLATOR GENERATOR CHANGE;  Surgeon: Deboraha Sprang, MD;  Location: [facility] CATH LAB;  Service: Cardiovascular;  Laterality: N/A;  . INSERT / REPLACE / REMOVE PACEMAKER    . VSD REPAIR      Current Outpatient Medications  Medication Sig Dispense Refill  . carvedilol (COREG) 12.5 MG tablet Take 1 tablet (12.5 mg total) by mouth 2 (two) times daily. 180  tablet 3  . fluticasone (FLONASE) 50 MCG/ACT nasal spray Place 2 sprays into both nostrils daily. (Patient taking differently: Place 2 sprays into both nostrils as needed. ) 16 g 2  . loratadine (CLARITIN) 5 MG chewable tablet Chew 1 tablet (5 mg total) by mouth daily as needed for allergies. 30 tablet 0  . magnesium oxide (MAG-OX) 400 (241.3 Mg) MG tablet Take 1 tablet (400 mg total) by mouth 2 (two) times daily. 60 tablet 0  . Multiple Vitamin (MULTIVITAMIN) tablet Take 1 tablet by mouth daily.    . ondansetron (ZOFRAN ODT) 4 MG disintegrating tablet Take 1 tablet (4 mg total) by mouth every 8 (eight) hours as needed for nausea or vomiting. 20 tablet 0  . promethazine (PHENERGAN) 25 MG tablet Take 0.5-1 tablets (12.5-25 mg total) by mouth every 6 (six) hours as needed for nausea or vomiting. 20 tablet 0  . ranolazine (RANEXA) 1000 MG SR tablet Take 1 tablet (1,000 mg total) by mouth 2 (two) times daily. 180 tablet 3  . sacubitril-valsartan (ENTRESTO) 49-51 MG Take 1 tablet by mouth 2 (two) times daily. 180 tablet 1  . sildenafil (REVATIO) 20 MG tablet Take 2-3 tablets (40-60 mg total) by mouth daily as needed. Never combine with nitrates 30 tablet 2  . spironolactone (ALDACTONE) 25 MG tablet TAKE ONE-HALF (1/2) TABLET DAILY 45 tablet 3  . tiZANidine (ZANAFLEX) 4 MG capsule Take at bedtime, if it does not cause drowsiness you can take it up to every 8 hours. 30 capsule 0  . triamcinolone cream (KENALOG) 0.1 % Apply 1 application topically 2 (two) times daily. 30 g 0  . umeclidinium bromide (INCRUSE ELLIPTA) 62.5 MCG/INH AEPB Inhale 1 puff into the lungs daily. 30 each 5   No current facility-administered medications for this visit.    Allergies  Allergen Reactions  . Shellfish Allergy Nausea And Vomiting    Review of Systems negative except from HPI and PMH  Physical Exam BP 116/60 (BP Location: Left Arm, Patient Position: Sitting, Cuff Size: Normal)   Pulse 72   Ht 5\' 10"  (1.778 m)    Wt 222 lb (100.7 kg)   SpO2 96%   BMI 31.85 kg/m  Well developed and well nourished in no acute distress HENT normal Neck supple with JVP-flat Clear Device pocket well healed; without hematoma or erythema.  There is no tethering  Regular rate and rhythm, 2/6 systolic murmur no diastolic m  Abd-soft with active BS No Clubbing cyanosis   edema Skin-warm and dry A & Oriented  Grossly normal sensory and motor function     ECG sinus @ 72 19/12/41 Qwaves V1-2  Assessment and plan  Ventricular tachycardia-recurrent  Hypertension   Aortic valve-bioprosthesis   AS mod-severe//AI mod  ICD-[employer]  The patient's device was interrogated.  The information was  reviewed. No changes were made in the programming.     Cardiomyopathy-hypertrophic/ischemic-secondary  PVCs-infrequent  RCA No flow, presumed 2/2 surgery As above   Intercurrent ventricular tachycardia.  Increasingly frequent.  Lives with the "sword of Damocles"  Discussed treatment options including continuing current strategies, antiarrhythmic drug or catheter ablation.  He has had recurrent monomorphic albeit fast ventricular tachycardia since initiation of her normal exam; has had no recurrent polymorphic ventricular tachycardia.  He is inclined towards catheter ablation given the impact that the recurrences are having on his quality of life and the fear of the recurrences as well.  We discussed again his previously expressed desire to get seen at [facility].  I will reach out to Dr. Lurene Shadow and see if we can arrange consultation.  We will be available to follow-up either for procedure, device or none of the above depending on how he would like to proceed after he meets the [facility] team.  For now we will increase his ranolazine.  He is anxious about the potential for carvedilol to aggravate his fatigue.  Blood pressure is much better controlled.

## 2020-01-21 LAB — CUP PACEART INCLINIC DEVICE CHECK
Brady Statistic RV Percent Paced: 1 %
Date Time Interrogation Session: 20210817000000
HighPow Impedance: 42 Ohm
HighPow Impedance: 55 Ohm
Implantable Lead Implant Date: 20080214
Implantable Lead Location: 753860
Implantable Lead Model: 185
Implantable Lead Serial Number: 178017
Implantable Pulse Generator Implant Date: 20141216
Lead Channel Impedance Value: 638 Ohm
Lead Channel Pacing Threshold Amplitude: 1.1 V
Lead Channel Pacing Threshold Pulse Width: 0.4 ms
Lead Channel Sensing Intrinsic Amplitude: 17.7 mV
Lead Channel Setting Pacing Amplitude: 2 V
Lead Channel Setting Pacing Pulse Width: 0.4 ms
Lead Channel Setting Sensing Sensitivity: 0.6 mV
Pulse Gen Serial Number: 126781

## 2020-01-28 ENCOUNTER — Other Ambulatory Visit: Payer: 59

## 2020-02-02 ENCOUNTER — Ambulatory Visit (INDEPENDENT_AMBULATORY_CARE_PROVIDER_SITE_OTHER): Payer: 59

## 2020-02-02 ENCOUNTER — Other Ambulatory Visit: Payer: Self-pay

## 2020-02-02 DIAGNOSIS — I359 Nonrheumatic aortic valve disorder, unspecified: Secondary | ICD-10-CM | POA: Diagnosis not present

## 2020-02-02 LAB — ECHOCARDIOGRAM COMPLETE
AR max vel: 1.33 cm2
AV Area VTI: 1.16 cm2
AV Area mean vel: 1.14 cm2
AV Mean grad: 33 mmHg
AV Peak grad: 60.2 mmHg
Ao pk vel: 3.88 m/s
Area-P 1/2: 3.27 cm2
P 1/2 time: 491 msec
S' Lateral: 5.1 cm
Single Plane A4C EF: 32.8 %

## 2020-02-11 ENCOUNTER — Ambulatory Visit (INDEPENDENT_AMBULATORY_CARE_PROVIDER_SITE_OTHER): Payer: 59 | Admitting: Emergency Medicine

## 2020-02-11 DIAGNOSIS — I429 Cardiomyopathy, unspecified: Secondary | ICD-10-CM | POA: Diagnosis not present

## 2020-02-11 LAB — CUP PACEART REMOTE DEVICE CHECK
Battery Remaining Longevity: 84 mo
Battery Remaining Percentage: 87 %
Brady Statistic RV Percent Paced: 0 %
Date Time Interrogation Session: 20210923030100
HighPow Impedance: 56 Ohm
Implantable Lead Implant Date: 20080214
Implantable Lead Location: 753860
Implantable Lead Model: 185
Implantable Lead Serial Number: 178017
Implantable Pulse Generator Implant Date: 20141216
Lead Channel Impedance Value: 657 Ohm
Lead Channel Pacing Threshold Amplitude: 1.1 V
Lead Channel Pacing Threshold Pulse Width: 0.4 ms
Lead Channel Setting Pacing Amplitude: 2 V
Lead Channel Setting Pacing Pulse Width: 0.4 ms
Lead Channel Setting Sensing Sensitivity: 0.6 mV
Pulse Gen Serial Number: 126781

## 2020-02-16 NOTE — Progress Notes (Signed)
Patient: [name], Male    DOB: 1976-01-03, 44 y.o.   MRN: [id number] Delsa Grana, PA-C Visit Date: 02/17/2020  Today's Provider: Delsa Grana, PA-C   Chief Complaint  Patient presents with  . Annual Exam   Subjective:   Annual physical exam:  [name] is a 44 y.o. male who presents today for health maintenance and annual & complete physical exam.   Exercise/Activity:    Hasn't walked in a while, but used to be part of his routine Doesn't push exertional activity and no limitations from cardioogy Diet/nutrition:   Not particular diet, white meat, little red meat, does eat fast food more than he should Sleep:  Not sleeping well  Vertigo sx - onset in August few episodes while driving, loss of balance orientation/dizziness lasted about 30-60 min with occurrence, dramamine helped     USPSTF grade A and B recommendations - reviewed and addressed today  Depression:  Phq 9 completed today by patient, was reviewed by me with patient in the room, score is  negative, pt feels overall good but not sleeping really well PHQ 2/9 Scores 02/17/2020 05/04/2019 02/16/2019 06/19/2018  PHQ - 2 Score 0 0 0 0  PHQ- 9 Score - 0 0 -   Depression screen [facility] 2/9 02/17/2020 05/04/2019 02/16/2019 06/19/2018 03/28/2018  Decreased Interest 0 0 0 0 0  Down, Depressed, Hopeless 0 0 0 0 0  PHQ - 2 Score 0 0 0 0 0  Altered sleeping - 0 0 - 0  Tired, decreased energy - 0 0 - 0  Change in appetite - 0 0 - 0  Feeling bad or failure about yourself  - 0 0 - 0  Trouble concentrating - 0 0 - 0  Moving slowly or fidgety/restless - 0 0 - 0  Suicidal thoughts - 0 0 - 0  PHQ-9 Score - 0 0 - 0  Difficult doing work/chores - Not difficult at all Not difficult at all - Not difficult at all    Hep C Screening: done 12/25/16  STD testing and prevention (HIV/chl/gon/syphilis): HIV done 12/25/16 sexually active wife, no exposure risk or need for additional testing  Intimate partner violence:  Feels  safe Prostate cancer: ordered Prostate cancer screening with PSA: Discussed risks and benefits of PSA testing and provided handout. Pt wishes to have PSA drawn today.  Lab Results  Component Value Date   PSA 0.9 02/16/2019   PSA 1.1 02/13/2018     Advanced Care Planning:  A voluntary discussion about advance care planning including the explanation and discussion of advance directives.  Discussed health care proxy and Living will, and the patient was able to identify a health care proxy as his wife Nhia Heaphy .  Patient does not have a living will at present time. If patient does have living will, I have requested they bring this to the clinic to be scanned in to their chart.  Health Maintenance  Topic Date Due  . COVID-19 Vaccine (1) Never done  . INFLUENZA VACCINE  08/18/2020 (Originally 12/20/2019)  . TETANUS/TDAP  05/09/2025  . Hepatitis C Screening  Completed  . HIV Screening  Completed   Skin cancer:   Pt reports no hx of skin cancer, suspicious lesions/biopsies in the past.  Colorectal cancer:  colonoscopy is due for age Pt denies melena, hematochezia, change in bowel movements pattern or caliber denies unintentional weight loss  Lung cancer:   Low Dose CT Chest recommended if Age 57-80 years,  20 pack-year currently smoking OR have quit w/in 15years. Patient does not qualify.   Social History   Tobacco Use  . Smoking status: Never Smoker  . Smokeless tobacco: Never Used  Substance Use Topics  . Alcohol use: No     Alcohol screening:   [facility] Visit from 02/17/2020 in [facility]  AUDIT-C Score 0       Blood pressure/Hypertension: BP Readings from Last 3 Encounters:  02/17/20 126/76  01/05/20 116/60  07/07/19 112/76   Weight/Obesity: Wt Readings from Last 3 Encounters:  02/17/20 218 lb 6.4 oz (99.1 kg)  01/05/20 222 lb (100.7 kg)  07/07/19 221 lb 4 oz (100.4 kg)   BMI Readings from Last 3 Encounters:  02/17/20 31.34 kg/m  01/05/20  31.85 kg/m  07/07/19 31.75 kg/m    Lipids:  Lab Results  Component Value Date   CHOL 174 02/16/2019   CHOL 165 02/13/2018   CHOL 160 12/25/2016   Lab Results  Component Value Date   HDL 40 02/16/2019   HDL 38 (L) 02/13/2018   HDL 44 12/25/2016   Lab Results  Component Value Date   LDLCALC 114 (H) 02/16/2019   LDLCALC 110 (H) 02/13/2018   LDLCALC 92 12/25/2016   Lab Results  Component Value Date   TRIG 102 02/16/2019   TRIG 80 02/13/2018   TRIG 121 12/25/2016   Lab Results  Component Value Date   CHOLHDL 4.4 02/16/2019   CHOLHDL 4.3 02/13/2018   CHOLHDL 3.6 12/25/2016   No results found for: LDLDIRECT Based on the results of lipid panel his/her cardiovascular risk factor ( using [place] )  in the next 10 years is : The 10-year ASCVD risk score Mikey Bussing DC Brooke Bonito., et al., 2013) is: 6.8%   Values used to calculate the score:     Age: 23 years     Sex: Male     Is Non-Hispanic African American: Yes     Diabetic: No     Tobacco smoker: No     Systolic Blood Pressure: 989 mmHg     Is BP treated: Yes     HDL Cholesterol: 40 mg/dL     Total Cholesterol: 174 mg/dL Glucose:  Glucose  Date Value Ref Range Status  08/01/2018 97 65 - 99 mg/dL Final  06/24/2018 88 65 - 99 mg/dL Final   Glucose, Bld  Date Value Ref Range Status  02/16/2019 95 65 - 99 mg/dL Final    Comment:    .            Fasting reference interval .   04/11/2018 106 (H) 70 - 99 mg/dL Final  02/13/2018 93 65 - 99 mg/dL Final    Comment:    .            Fasting reference interval .    Glucose-Capillary  Date Value Ref Range Status  09/05/2011 112 (H) 70 - 99 mg/dL Final    Social History      He  reports that he has never smoked. He has never used smokeless tobacco. He reports that he does not drink alcohol and does not use drugs.       Social History   Socioeconomic History  . Marital status: Married    Spouse name: Anderson Malta  . Number of children: 2  . Years of education: 24  .  Highest education level: Bachelor's degree (e.g., BA, AB, BS)  Occupational History  . Occupation: Full time  Employer: [employer]    Comment: In IT dept at [employer]  Tobacco Use  . Smoking status: Never Smoker  . Smokeless tobacco: Never Used  Vaping Use  . Vaping Use: Never used  Substance and Sexual Activity  . Alcohol use: No  . Drug use: No  . Sexual activity: Yes    Partners: Female  Other Topics Concern  . Not on file  Social History Narrative   Married   Social Determinants of Health   Financial Resource Strain: Low Risk   . Difficulty of Paying Living Expenses: Not hard at all  Food Insecurity: No Food Insecurity  . Worried About Charity fundraiser in the Last Year: Never true  . Ran Out of Food in the Last Year: Never true  Transportation Needs: No Transportation Needs  . Lack of Transportation (Medical): No  . Lack of Transportation (Non-Medical): No  Physical Activity: Inactive  . Days of Exercise per Week: 0 days  . Minutes of Exercise per Session: 0 min  Stress: No Stress Concern Present  . Feeling of Stress : Not at all  Social Connections: Moderately Integrated  . Frequency of Communication with Friends and Family: Twice a week  . Frequency of Social Gatherings with Friends and Family: Twice a week  . Attends Religious Services: 1 to 4 times per year  . Active Member of Clubs or Organizations: No  . Attends Archivist Meetings: Never  . Marital Status: Married     Family History        Family Status  Relation Name Status  . Mother  Alive  . Father  Deceased       sucide  . Sister  Alive  . Daughter  Alive  . Son  Alive  . MGM  Deceased       cancer  . MGF  Deceased       cancer  . PGM  Deceased       accident  . PGF  Deceased       unknown  . Sister  Alive        His family history includes Cancer in his maternal grandfather and maternal grandmother; Heart disease in his father; Hypertension in his maternal grandfather and  maternal grandmother.       Family History  Problem Relation Age of Onset  . Heart disease Father   . Hypertension Maternal Grandmother   . Cancer Maternal Grandmother   . Hypertension Maternal Grandfather   . Cancer Maternal Grandfather     Patient Active Problem List   Diagnosis Date Noted  . Epigastric pain 05/09/2017  . Lipoma of buttock 01/11/2017  . ED (erectile dysfunction) 12/01/2016  . Syncope 05/16/2016  . Sustained ventricular tachycardia ([place]) 05/16/2016  . Ventricular tachycardia ([place]) 05/16/2016  . Prostate cancer screening 12/22/2015  . Hearing loss of both ears 12/22/2015  . Abnormal findings-gastrointestinal tract   . IBD (inflammatory bowel disease) 09/23/2015  . Pain in lower back 09/15/2015  . Night sweats 09/15/2015  . Groin pain 07/29/2015  . Screening for STD (sexually transmitted disease) 07/29/2015  . Preventative health care 05/10/2015  . Presence of cardiac defibrillator 05/10/2015  . Hypertension goal BP (blood pressure) < 140/90 12/13/2011  . H/O aortic valve replacement 08/08/2009  . Cardiomyopathy, secondary ([place]) 03/09/2009  . History of ventricular tachycardia 03/09/2009    Past Surgical History:  Procedure Laterality Date  . ANGIOPLASTY     RCA repair with vein angioplasty following  injury with the aforementioned surgery  . AORTIC VALVE REPLACEMENT     Bioprosthesis  . CARDIAC DEFIBRILLATOR PLACEMENT    . CARDIAC VALVE REPLACEMENT    . CHOLECYSTECTOMY    . COLONOSCOPY WITH PROPOFOL N/A 12/06/2015   Procedure: COLONOSCOPY WITH PROPOFOL;  Surgeon: [name], MD;  Location: [facility] ENDOSCOPY;  Service: Endoscopy;  Laterality: N/A;  . CORONARY ARTERY BYPASS GRAFT    . IMPLANTABLE CARDIOVERTER DEFIBRILLATOR GENERATOR CHANGE N/A 05/05/2013   Procedure: IMPLANTABLE CARDIOVERTER DEFIBRILLATOR GENERATOR CHANGE;  Surgeon: Deboraha Sprang, MD;  Location: [facility] CATH LAB;  Service: Cardiovascular;  Laterality: N/A;  . INSERT / REPLACE / REMOVE PACEMAKER     . VSD REPAIR       Current Outpatient Medications:  .  carvedilol (COREG) 12.5 MG tablet, Take 1 tablet (12.5 mg total) by mouth 2 (two) times daily., Disp: 180 tablet, Rfl: 3 .  fluticasone (FLONASE) 50 MCG/ACT nasal spray, Place 2 sprays into both nostrils daily. (Patient taking differently: Place 2 sprays into both nostrils as needed. ), Disp: 16 g, Rfl: 2 .  loratadine (CLARITIN) 5 MG chewable tablet, Chew 1 tablet (5 mg total) by mouth daily as needed for allergies., Disp: 30 tablet, Rfl: 0 .  magnesium oxide (MAG-OX) 400 (241.3 Mg) MG tablet, Take 1 tablet (400 mg total) by mouth 2 (two) times daily., Disp: 60 tablet, Rfl: 0 .  Multiple Vitamin (MULTIVITAMIN) tablet, Take 1 tablet by mouth daily., Disp: , Rfl:  .  ondansetron (ZOFRAN ODT) 4 MG disintegrating tablet, Take 1 tablet (4 mg total) by mouth every 8 (eight) hours as needed for nausea or vomiting., Disp: 20 tablet, Rfl: 0 .  promethazine (PHENERGAN) 25 MG tablet, Take 0.5-1 tablets (12.5-25 mg total) by mouth every 6 (six) hours as needed for nausea or vomiting., Disp: 20 tablet, Rfl: 0 .  ranolazine (RANEXA) 1000 MG SR tablet, Take 1 tablet (1,000 mg total) by mouth 2 (two) times daily., Disp: 180 tablet, Rfl: 3 .  sacubitril-valsartan (ENTRESTO) 49-51 MG, Take 1 tablet by mouth 2 (two) times daily., Disp: 180 tablet, Rfl: 1 .  sildenafil (REVATIO) 20 MG tablet, Take 2-3 tablets (40-60 mg total) by mouth daily as needed. Never combine with nitrates, Disp: 30 tablet, Rfl: 2 .  spironolactone (ALDACTONE) 25 MG tablet, TAKE ONE-HALF (1/2) TABLET DAILY, Disp: 45 tablet, Rfl: 3 .  tiZANidine (ZANAFLEX) 4 MG capsule, Take at bedtime, if it does not cause drowsiness you can take it up to every 8 hours., Disp: 30 capsule, Rfl: 0 .  triamcinolone cream (KENALOG) 0.1 %, Apply 1 application topically 2 (two) times daily., Disp: 30 g, Rfl: 0 .  umeclidinium bromide (INCRUSE ELLIPTA) 62.5 MCG/INH AEPB, Inhale 1 puff into the lungs daily.,  Disp: 30 each, Rfl: 5  Allergies  Allergen Reactions  . Shellfish Allergy Nausea And Vomiting    Patient Care Team: Delsa Grana, PA-C as PCP - General (Family Medicine) Sanda Klein Satira Anis, MD as Attending Physician (Family Medicine) Deboraha Sprang, MD as Consulting Physician (Cardiology)   Chart Review: I personally reviewed active problem list, medication list, allergies, family history, social history, health maintenance, notes from last encounter, lab results, imaging with the patient/caregiver today.   Review of Systems  10 Systems reviewed and are negative for acute change except as noted in the HPI.       Objective:   Vitals:  Vitals:   02/17/20 1013  BP: 126/76  Pulse: 82  Resp: 18  Temp: 97.9  F (36.6 C)  TempSrc: Oral  SpO2: 97%  Weight: 218 lb 6.4 oz (99.1 kg)  Height: 5\' 10"  (1.778 m)    Body mass index is 31.34 kg/m.  Physical Exam Vitals and nursing note reviewed.  Constitutional:      General: He is not in acute distress.    Appearance: Normal appearance. He is well-developed. He is obese. He is not ill-appearing, toxic-appearing or diaphoretic.  HENT:     Head: Normocephalic and atraumatic.     Jaw: No trismus.     Right Ear: Tympanic membrane, ear canal and external ear normal.     Left Ear: Tympanic membrane, ear canal and external ear normal.     Nose: Nose normal. No mucosal edema, congestion or rhinorrhea.     Right Sinus: No maxillary sinus tenderness or frontal sinus tenderness.     Left Sinus: No maxillary sinus tenderness or frontal sinus tenderness.     Mouth/Throat:     Mouth: Mucous membranes are moist.     Pharynx: Oropharynx is clear. Uvula midline. No oropharyngeal exudate, posterior oropharyngeal erythema or uvula swelling.  Eyes:     General: Lids are normal. No scleral icterus.       Right eye: No discharge.        Left eye: No discharge.     Conjunctiva/sclera: Conjunctivae normal.     Pupils: Pupils are equal, round, and  reactive to light.  Neck:     Trachea: Trachea and phonation normal. No tracheal deviation.  Cardiovascular:     Rate and Rhythm: Normal rate and regular rhythm.     Pulses: Normal pulses.          Radial pulses are 2+ on the right side and 2+ on the left side.       Posterior tibial pulses are 2+ on the right side and 2+ on the left side.     Heart sounds: Murmur heard.  No friction rub. No gallop.   Pulmonary:     Effort: Pulmonary effort is normal.     Breath sounds: Normal breath sounds. No wheezing, rhonchi or rales.  Abdominal:     General: Bowel sounds are normal. There is no distension.     Palpations: Abdomen is soft.     Tenderness: There is no abdominal tenderness. There is no guarding or rebound.  Musculoskeletal:        General: Normal range of motion.     Cervical back: Normal range of motion and neck supple.     Right lower leg: No edema.     Left lower leg: No edema.  Lymphadenopathy:     Cervical: No cervical adenopathy.  Skin:    General: Skin is warm and dry.     Capillary Refill: Capillary refill takes less than 2 seconds.     Coloration: Skin is not jaundiced or pale.     Findings: No rash.  Neurological:     Mental Status: He is alert.     Gait: Gait normal.  Psychiatric:        Mood and Affect: Mood normal.        Speech: Speech normal.        Behavior: Behavior normal.        Fall Risk: Fall Risk  02/17/2020 05/04/2019 02/16/2019 06/19/2018 03/28/2018  Falls in the past year? 0 0 0 0 0  Number falls in past yr: 0 0 0 0 -  Injury with Fall? 0 0 0  0 -  Follow up Falls evaluation completed Falls evaluation completed - - -    Functional Status Survey: Is the patient deaf or have difficulty hearing?: No Does the patient have difficulty seeing, even when wearing glasses/contacts?: No Does the patient have difficulty concentrating, remembering, or making decisions?: No Does the patient have difficulty walking or climbing stairs?: No Does the patient  have difficulty dressing or bathing?: No Does the patient have difficulty doing errands alone such as visiting a doctor's office or shopping?: No   Assessment & Plan:    CPE completed today   . Prostate cancer screening and PSA options (with potential risks and benefits of testing vs not testing) were discussed along with recent recs/guidelines, shared decision making and handout/information given to pt today  . USPSTF grade A and B recommendations reviewed with patient; age-appropriate recommendations, preventive care, screening tests, etc discussed and encouraged; healthy living encouraged; see AVS for patient education given to patient  . Discussed importance of 150 minutes of physical activity weekly, AHA exercise recommendations given to pt in AVS/handout  . Discussed importance of healthy diet:  eating lean meats and proteins, avoiding trans fats and saturated fats, avoid simple sugars and excessive carbs in diet, eat 6 servings of fruit/vegetables daily and drink plenty of water and avoid sweet beverages.  DASH diet reviewed if pt has HTN  . Recommended pt to do annual eye exam and routine dental exams/cleanings  . Reviewed Health Maintenance: Health Maintenance  Topic Date Due  . COVID-19 Vaccine (1) Never done  . INFLUENZA VACCINE  12/20/2019  . TETANUS/TDAP  05/09/2025  . Hepatitis C Screening  Completed  . HIV Screening  Completed    . Immunizations: Immunization History  Administered Date(s) Administered  . Influenza,inj,Quad PF,6+ Mos 02/13/2018  . Tdap 05/10/2015        ICD-10-CM   1. Annual physical exam  Z00.00 PSA    Lipid panel    Comprehensive Metabolic Panel (CMET)    CBC w/Diff/Platelet  2. Prostate cancer screening  Z12.5 PSA  3. Hypertension goal BP (blood pressure) < 140/90  I10 Comprehensive Metabolic Panel (CMET)   Per cardiology, blood pressure well controlled, at goal today, compliant with medications and follow-up  4. Dyslipidemia  E78.5 Lipid  panel   Elevated cholesterol, not on statin, recheck today, encouraged healthy diet and lifestyle, follow-up with cardiology for any recommendations pertinent to his dx  5. Vertigo  R42    intermittent dizziness/spinning/loss of balance onset in August  6. Pityriasis rosea  L42 loratadine (CLARITIN) 5 MG chewable tablet    triamcinolone cream (KENALOG) 0.1 %   Diagnosed previously by his last PCP, he requested refills on his medications today  7. Reactive airway disease without complication, unspecified asthma severity, unspecified whether persistent  J45.909 umeclidinium bromide (INCRUSE ELLIPTA) 62.5 MCG/INH AEPB   Stable and well-controlled symptoms on Incruse and using albuterol rescue inhaler as needed     Delsa Grana, PA-C 02/17/20 10:38 AM  [facility]

## 2020-02-16 NOTE — Progress Notes (Signed)
Remote ICD transmission.   

## 2020-02-17 ENCOUNTER — Other Ambulatory Visit: Payer: Self-pay

## 2020-02-17 ENCOUNTER — Encounter: Payer: Self-pay | Admitting: Family Medicine

## 2020-02-17 ENCOUNTER — Ambulatory Visit (INDEPENDENT_AMBULATORY_CARE_PROVIDER_SITE_OTHER): Payer: 59 | Admitting: Family Medicine

## 2020-02-17 VITALS — BP 126/76 | HR 82 | Temp 97.9°F | Resp 18 | Ht 70.0 in | Wt 218.4 lb

## 2020-02-17 DIAGNOSIS — E785 Hyperlipidemia, unspecified: Secondary | ICD-10-CM

## 2020-02-17 DIAGNOSIS — Z0000 Encounter for general adult medical examination without abnormal findings: Secondary | ICD-10-CM | POA: Diagnosis not present

## 2020-02-17 DIAGNOSIS — J45909 Unspecified asthma, uncomplicated: Secondary | ICD-10-CM

## 2020-02-17 DIAGNOSIS — Z125 Encounter for screening for malignant neoplasm of prostate: Secondary | ICD-10-CM | POA: Diagnosis not present

## 2020-02-17 DIAGNOSIS — I10 Essential (primary) hypertension: Secondary | ICD-10-CM | POA: Diagnosis not present

## 2020-02-17 DIAGNOSIS — R42 Dizziness and giddiness: Secondary | ICD-10-CM

## 2020-02-17 DIAGNOSIS — L42 Pityriasis rosea: Secondary | ICD-10-CM

## 2020-02-17 MED ORDER — FLUTICASONE PROPIONATE 50 MCG/ACT NA SUSP: 2.0000 | Freq: Every day | NASAL | 2 refills | Status: DC

## 2020-02-17 MED ORDER — TRIAMCINOLONE ACETONIDE 0.1 % EX CREA: 1.0000 "application " | TOPICAL_CREAM | Freq: Two times a day (BID) | CUTANEOUS | 0 refills | Status: DC | PRN

## 2020-02-17 MED ORDER — MECLIZINE HCL 25 MG PO TABS: 12.5000 mg | ORAL_TABLET | Freq: Three times a day (TID) | ORAL | 2 refills | Status: AC | PRN

## 2020-02-17 MED ORDER — INCRUSE ELLIPTA 62.5 MCG/INH IN AEPB: 1.0000 | INHALATION_SPRAY | Freq: Every day | RESPIRATORY_TRACT | 5 refills | Status: DC

## 2020-02-17 MED ORDER — LORATADINE 5 MG PO CHEW: 5.0000 mg | CHEWABLE_TABLET | Freq: Every day | ORAL | 0 refills | Status: DC | PRN

## 2020-02-17 NOTE — Patient Instructions (Addendum)
Preventive Care 41-46 Years Old, Male Preventive care refers to lifestyle choices and visits with your health care provider that can promote health and wellness. This includes:  A yearly physical exam. This is also called an annual well check.  Regular dental and eye exams.  Immunizations.  Screening for certain conditions.  Healthy lifestyle choices, such as eating a healthy diet, getting regular exercise, not using drugs or products that contain nicotine and tobacco, and limiting alcohol use. What can I expect for my preventive care visit? Physical exam Your health care provider will check:  Height and weight. These may be used to calculate body mass index (BMI), which is a measurement that tells if you are at a healthy weight.  Heart rate and blood pressure.  Your skin for abnormal spots. Counseling Your health care provider may ask you questions about:  Alcohol, tobacco, and drug use.  Emotional well-being.  Home and relationship well-being.  Sexual activity.  Eating habits.  Work and work Statistician. What immunizations do I need?  Influenza (flu) vaccine  This is recommended every year. Tetanus, diphtheria, and pertussis (Tdap) vaccine  You may need a Td booster every 10 years. Varicella (chickenpox) vaccine  You may need this vaccine if you have not already been vaccinated. Zoster (shingles) vaccine  You may need this after age 64. Measles, mumps, and rubella (MMR) vaccine  You may need at least one dose of MMR if you were born in 1957 or later. You may also need a second dose. Pneumococcal conjugate (PCV13) vaccine  You may need this if you have certain conditions and were not previously vaccinated. Pneumococcal polysaccharide (PPSV23) vaccine  You may need one or two doses if you smoke cigarettes or if you have certain conditions. Meningococcal conjugate (MenACWY) vaccine  You may need this if you have certain conditions. Hepatitis A  vaccine  You may need this if you have certain conditions or if you travel or work in places where you may be exposed to hepatitis A. Hepatitis B vaccine  You may need this if you have certain conditions or if you travel or work in places where you may be exposed to hepatitis B. Haemophilus influenzae type b (Hib) vaccine  You may need this if you have certain risk factors. Human papillomavirus (HPV) vaccine  If recommended by your health care provider, you may need three doses over 6 months. You may receive vaccines as individual doses or as more than one vaccine together in one shot (combination vaccines). Talk with your health care provider about the risks and benefits of combination vaccines. What tests do I need? Blood tests  Lipid and cholesterol levels. These may be checked every 5 years, or more frequently if you are over 60 years old.  Hepatitis C test.  Hepatitis B test. Screening  Lung cancer screening. You may have this screening every year starting at age 43 if you have a 30-pack-year history of smoking and currently smoke or have quit within the past 15 years.  Prostate cancer screening. Recommendations will vary depending on your family history and other risks.  Colorectal cancer screening. All adults should have this screening starting at age 72 and continuing until age 2. Your health care provider may recommend screening at age 14 if you are at increased risk. You will have tests every 1-10 years, depending on your results and the type of screening test.  Diabetes screening. This is done by checking your blood sugar (glucose) after you have not eaten  for a while (fasting). You may have this done every 1-3 years.  Sexually transmitted disease (STD) testing. Follow these instructions at home: Eating and drinking  Eat a diet that includes fresh fruits and vegetables, whole grains, lean protein, and low-fat dairy products.  Take vitamin and mineral supplements as  recommended by your health care provider.  Do not drink alcohol if your health care provider tells you not to drink.  If you drink alcohol: ? Limit how much you have to 0-2 drinks a day. ? Be aware of how much alcohol is in your drink. In the [place] one drink equals one 12 oz bottle of beer (355 mL), one 5 oz glass of wine (148 mL), or one 1 oz glass of hard liquor (44 mL). Lifestyle  Take daily care of your teeth and gums.  Stay active. Exercise for at least 30 minutes on 5 or more days each week.  Do not use any products that contain nicotine or tobacco, such as cigarettes, e-cigarettes, and chewing tobacco. If you need help quitting, ask your health care provider.  If you are sexually active, practice safe sex. Use a condom or other form of protection to prevent STIs (sexually transmitted infections).  Talk with your health care provider about taking a low-dose aspirin every day starting at age 62. What's next?  Go to your health care provider once a year for a well check visit.  Ask your health care provider how often you should have your eyes and teeth checked.  Stay up to date on all vaccines. This information is not intended to replace advice given to you by your health care provider. Make sure you discuss any questions you have with your health care provider. Document Revised: 05/01/2018 Document Reviewed: 05/01/2018 Elsevier Patient Education  [place].   Vertigo Vertigo is the feeling that you or the things around you are moving when they are not. This feeling can come and go at any time. Vertigo often goes away on its own. This condition can be dangerous if it happens when you are doing activities like driving or working with machines. Your doctor will do tests to find the cause of your vertigo. These tests will also help your doctor decide on the best treatment for you. Follow these instructions at home: Eating and drinking      Drink enough fluid to keep  your pee (urine) pale yellow.  Do not drink alcohol. Activity  Return to your normal activities as told by your doctor. Ask your doctor what activities are safe for you.  In the morning, first sit up on the side of the bed. When you feel okay, stand slowly while you hold onto something until you know that your balance is fine.  Move slowly. Avoid sudden body or head movements or certain positions, as told by your doctor.  Use a cane if you have trouble standing or walking.  Sit down right away if you feel dizzy.  Avoid doing any tasks or activities that can cause danger to you or others if you get dizzy.  Avoid bending down if you feel dizzy. Place items in your home so that they are easy for you to reach without leaning over.  Do not drive or use heavy machinery if you feel dizzy. General instructions  Take over-the-counter and prescription medicines only as told by your doctor.  Keep all follow-up visits as told by your doctor. This is important. Contact a doctor if:  Your medicine  does not help your vertigo.  You have a fever.  Your problems get worse or you have new symptoms.  Your family or friends see changes in your behavior.  The feeling of being sick to your stomach gets worse.  Your vomiting gets worse.  You lose feeling (have numbness) in part of your body.  You feel prickling and tingling in a part of your body. Get help right away if:  You have trouble moving or talking.  You are always dizzy.  You pass out (faint).  You get very bad headaches.  You feel weak in your hands, arms, or legs.  You have changes in your hearing.  You have changes in how you see (vision).  You get a stiff neck.  Bright light starts to bother you. Summary  Vertigo is the feeling that you or the things around you are moving when they are not.  Your doctor will do tests to find the cause of your vertigo.  You may be told to avoid some tasks, positions, or  movements.  Contact a doctor if your medicine is not helping, or if you have a fever, new symptoms, or a change in behavior.  Get help right away if you get very bad headaches, or if you have changes in how you speak, hear, or see. This information is not intended to replace advice given to you by your health care provider. Make sure you discuss any questions you have with your health care provider. Document Revised: 03/31/2018 Document Reviewed: 03/31/2018 Elsevier Patient Education  2020 [employer].

## 2020-02-18 LAB — COMPREHENSIVE METABOLIC PANEL
AG Ratio: 1.5 (calc) (ref 1.0–2.5)
ALT: 39 U/L (ref 9–46)
AST: 28 U/L (ref 10–40)
Albumin: 4.4 g/dL (ref 3.6–5.1)
Alkaline phosphatase (APISO): 27 U/L — ABNORMAL LOW (ref 36–130)
BUN: 14 mg/dL (ref 7–25)
CO2: 32 mmol/L (ref 20–32)
Calcium: 10 mg/dL (ref 8.6–10.3)
Chloride: 106 mmol/L (ref 98–110)
Creat: 1.25 mg/dL (ref 0.60–1.35)
Globulin: 2.9 g/dL (calc) (ref 1.9–3.7)
Glucose, Bld: 98 mg/dL (ref 65–99)
Potassium: 4.8 mmol/L (ref 3.5–5.3)
Sodium: 144 mmol/L (ref 135–146)
Total Bilirubin: 0.8 mg/dL (ref 0.2–1.2)
Total Protein: 7.3 g/dL (ref 6.1–8.1)

## 2020-02-18 LAB — CBC WITH DIFFERENTIAL/PLATELET
Absolute Monocytes: 585 cells/uL (ref 200–950)
Basophils Absolute: 39 cells/uL (ref 0–200)
Basophils Relative: 1 %
Eosinophils Absolute: 98 cells/uL (ref 15–500)
Eosinophils Relative: 2.5 %
HCT: 43.2 % (ref 38.5–50.0)
Hemoglobin: 14.1 g/dL (ref 13.2–17.1)
Lymphs Abs: 991 cells/uL (ref 850–3900)
MCH: 29.1 pg (ref 27.0–33.0)
MCHC: 32.6 g/dL (ref 32.0–36.0)
MCV: 89.3 fL (ref 80.0–100.0)
MPV: 11.5 fL (ref 7.5–12.5)
Monocytes Relative: 15 %
Neutro Abs: 2188 cells/uL (ref 1500–7800)
Neutrophils Relative %: 56.1 %
Platelets: 163 10*3/uL (ref 140–400)
RBC: 4.84 10*6/uL (ref 4.20–5.80)
RDW: 11.2 % (ref 11.0–15.0)
Total Lymphocyte: 25.4 %
WBC: 3.9 10*3/uL (ref 3.8–10.8)

## 2020-02-18 LAB — LIPID PANEL
Cholesterol: 172 mg/dL (ref <200)
HDL: 38 mg/dL — ABNORMAL LOW (ref >=40)
LDL Cholesterol (Calc): 106 mg/dL (calc) — ABNORMAL HIGH
Non-HDL Cholesterol (Calc): 134 mg/dL (calc) — ABNORMAL HIGH (ref <130)
Total CHOL/HDL Ratio: 4.5 (calc) (ref <5.0)
Triglycerides: 160 mg/dL — ABNORMAL HIGH (ref <150)

## 2020-02-18 LAB — PSA: PSA: 0.73 ng/mL (ref <=4.0)

## 2020-02-23 ENCOUNTER — Encounter: Payer: Self-pay | Admitting: Family Medicine

## 2020-02-25 ENCOUNTER — Encounter: Payer: Self-pay | Admitting: Family Medicine

## 2020-02-25 ENCOUNTER — Ambulatory Visit (INDEPENDENT_AMBULATORY_CARE_PROVIDER_SITE_OTHER): Payer: 59 | Admitting: Family Medicine

## 2020-02-25 ENCOUNTER — Telehealth: Payer: 59 | Admitting: Family

## 2020-02-25 ENCOUNTER — Other Ambulatory Visit: Payer: Self-pay

## 2020-02-25 VITALS — Ht 70.0 in | Wt 220.0 lb

## 2020-02-25 DIAGNOSIS — Z20822 Contact with and (suspected) exposure to covid-19: Secondary | ICD-10-CM | POA: Diagnosis not present

## 2020-02-25 DIAGNOSIS — J069 Acute upper respiratory infection, unspecified: Secondary | ICD-10-CM

## 2020-02-25 DIAGNOSIS — J302 Other seasonal allergic rhinitis: Secondary | ICD-10-CM

## 2020-02-25 DIAGNOSIS — H6980 Other specified disorders of Eustachian tube, unspecified ear: Secondary | ICD-10-CM | POA: Diagnosis not present

## 2020-02-25 DIAGNOSIS — R059 Cough, unspecified: Secondary | ICD-10-CM

## 2020-02-25 NOTE — Patient Instructions (Signed)
Get on an over the counter allergy medicine - take daily for the next couple weeks - like zyrtec, claritin, xyzal, allegra - or generic equivalent.  For your congestion and ear symptoms, continue the flonase and add decongestants if you can tolerate them - like sudafed (you have to show you license to the pharmacist and get from BEHIND the counter)  Do these things daily for the next couple weeks  Most symptoms are bothersome but can gradually improve.  It is common for them to not resolve completely.  And this may be the case for you with your history of recent vertigo symptoms.  You may need to follow up with ENT for further evaluation.  If you have any sudden worsening of symptoms - like severe facial and/or sinus pain with fever - then we would treat you for a secondary acute bacterial sinus infection.  You do not seem to have this right now, and antibiotics will not help you with allergies, congestion and/or a viral infection.  I recommend getting a PCR or molecular COVID test - and quarantine away from people until it is negative.  Follow up here if not improving in 2-3 weeks.  For cough you can use over the counter medicines as well - try mucinex, delsym, robitussin    See the info below   Eustachian Tube Dysfunction  Eustachian tube dysfunction refers to a condition in which a blockage develops in the narrow passage that connects the middle ear to the back of the nose (eustachian tube). The eustachian tube regulates air pressure in the middle ear by letting air move between the ear and nose. It also helps to drain fluid from the middle ear space. Eustachian tube dysfunction can affect one or both ears. When the eustachian tube does not function properly, air pressure, fluid, or both can build up in the middle ear. What are the causes? This condition occurs when the eustachian tube becomes blocked or cannot open normally. Common causes of this condition include:  Ear  infections.  Colds and other infections that affect the nose, mouth, and throat (upper respiratory tract).  Allergies.  Irritation from cigarette smoke.  Irritation from stomach acid coming up into the esophagus (gastroesophageal reflux). The esophagus is the tube that carries food from the mouth to the stomach.  Sudden changes in air pressure, such as from descending in an airplane or scuba diving.  Abnormal growths in the nose or throat, such as: ? Growths that line the nose (nasal polyps). ? Abnormal growth of cells (tumors). ? Enlarged tissue at the back of the throat (adenoids). What increases the risk? You are more likely to develop this condition if:  You smoke.  You are overweight.  You are a child who has: ? Certain birth defects of the mouth, such as cleft palate. ? Large tonsils or adenoids. What are the signs or symptoms? Common symptoms of this condition include:  A feeling of fullness in the ear.  Ear pain.  Clicking or popping noises in the ear.  Ringing in the ear.  Hearing loss.  Loss of balance.  Dizziness. Symptoms may get worse when the air pressure around you changes, such as when you travel to an area of high elevation, fly on an airplane, or go scuba diving. How is this diagnosed? This condition may be diagnosed based on:  Your symptoms.  A physical exam of your ears, nose, and throat.  Tests, such as those that measure: ? The movement of your  eardrum (tympanogram). ? Your hearing (audiometry). How is this treated? Treatment depends on the cause and severity of your condition.  In mild cases, you may relieve your symptoms by moving air into your ears. This is called "popping the ears."  In more severe cases, or if you have symptoms of fluid in your ears, treatment may include: ? Medicines to relieve congestion (decongestants). ? Medicines that treat allergies (antihistamines). ? Nasal sprays or ear drops that contain medicines that  reduce swelling (steroids). ? A procedure to drain the fluid in your eardrum (myringotomy). In this procedure, a small tube is placed in the eardrum to:  Drain the fluid.  Restore the air in the middle ear space. ? A procedure to insert a balloon device through the nose to inflate the opening of the eustachian tube (balloon dilation). Follow these instructions at home: Lifestyle  Do not do any of the following until your health care provider approves: ? Travel to high altitudes. ? Fly in airplanes. ? Work in a Pension scheme manager or room. ? Scuba dive.  Do not use any products that contain nicotine or tobacco, such as cigarettes and e-cigarettes. If you need help quitting, ask your health care provider.  Keep your ears dry. Wear fitted earplugs during showering and bathing. Dry your ears completely after. General instructions  Take over-the-counter and prescription medicines only as told by your health care provider.  Use techniques to help pop your ears as recommended by your health care provider. These may include: ? Chewing gum. ? Yawning. ? Frequent, forceful swallowing. ? Closing your mouth, holding your nose closed, and gently blowing as if you are trying to blow air out of your nose.  Keep all follow-up visits as told by your health care provider. This is important. Contact a health care provider if:  Your symptoms do not go away after treatment.  Your symptoms come back after treatment.  You are unable to pop your ears.  You have: ? A fever. ? Pain in your ear. ? Pain in your head or neck. ? Fluid draining from your ear.  Your hearing suddenly changes.  You become very dizzy.  You lose your balance. Summary  Eustachian tube dysfunction refers to a condition in which a blockage develops in the eustachian tube.  It can be caused by ear infections, allergies, inhaled irritants, or abnormal growths in the nose or throat.  Symptoms include ear pain, hearing  loss, or ringing in the ears.  Mild cases are treated with maneuvers to unblock the ears, such as yawning or ear popping.  Severe cases are treated with medicines. Surgery may also be done (rare). This information is not intended to replace advice given to you by your health care provider. Make sure you discuss any questions you have with your health care provider. Document Revised: 08/27/2017 Document Reviewed: 08/27/2017 Elsevier Patient Education  [place].   Viral Respiratory Infection A viral respiratory infection is an illness that affects parts of the body that are used for breathing. These include the lungs, nose, and throat. It is caused by a germ called a virus. Some examples of this kind of infection are:  A cold.  The flu (influenza).  A respiratory syncytial virus (RSV) infection. A person who gets this illness may have the following symptoms:  A stuffy or runny nose.  Yellow or green fluid in the nose.  A cough.  Sneezing.  Tiredness (fatigue).  Achy muscles.  A sore throat.  Sweating  or chills.  A fever.  A headache. Follow these instructions at home: Managing pain and congestion  Take over-the-counter and prescription medicines only as told by your doctor.  If you have a sore throat, gargle with salt water. Do this 3-4 times per day or as needed. To make a salt-water mixture, dissolve -1 tsp of salt in 1 cup of warm water. Make sure that all the salt dissolves.  Use nose drops made from salt water. This helps with stuffiness (congestion). It also helps soften the skin around your nose.  Drink enough fluid to keep your pee (urine) pale yellow. General instructions   Rest as much as possible.  Do not drink alcohol.  Do not use any products that have nicotine or tobacco, such as cigarettes and e-cigarettes. If you need help quitting, ask your doctor.  Keep all follow-up visits as told by your doctor. This is important. How is this  prevented?   Get a flu shot every year. Ask your doctor when you should get your flu shot.  Do not let other people get your germs. If you are sick: ? Stay home from work or school. ? Wash your hands with soap and water often. Wash your hands after you cough or sneeze. If soap and water are not available, use hand sanitizer.  Avoid contact with people who are sick during cold and flu season. This is in fall and winter. Get help if:  Your symptoms last for 10 days or longer.  Your symptoms get worse over time.  You have a fever.  You have very bad pain in your face or forehead.  Parts of your jaw or neck become very swollen. Get help right away if:  You feel pain or pressure in your chest.  You have shortness of breath.  You faint or feel like you will faint.  You keep throwing up (vomiting).  You feel confused. Summary  A viral respiratory infection is an illness that affects parts of the body that are used for breathing.  Examples of this illness include a cold, the flu, and respiratory syncytial virus (RSV) infection.  The infection can cause a runny nose, cough, sneezing, sore throat, and fever.  Follow what your doctor tells you about taking medicines, drinking lots of fluid, washing your hands, resting at home, and avoiding people who are sick. This information is not intended to replace advice given to you by your health care provider. Make sure you discuss any questions you have with your health care provider. Document Revised: 05/15/2018 Document Reviewed: 06/17/2017 Elsevier Patient Education  2020 [employer].

## 2020-02-25 NOTE — Progress Notes (Signed)
I personally spoke to patient. He was confused about the nature of an e-visit vs. Virtual visit.  He was scheduled to do an e-visit with his PCP at 11:20 today and agreed to cancel e-visit request; he understands he will not be charged for e-visit today;    NOTE: If you entered your credit card information for this eVisit, you will not be charged. You may see a "hold" on your card for the $35 but that hold will drop off and you will not have a charge processed.  Your e-visit answers were reviewed by a board certified advanced clinical practitioner to complete your personal care plan.  Thank you for using e-Visits.  Greater than 5 minutes, yet less than 10 minutes of time have been spent researching, coordinating, and implementing care for this patient.

## 2020-02-25 NOTE — Progress Notes (Signed)
Name: [name]   MRN: [id number]    DOB: [birth year]   Date:02/25/2020       Progress Note  Subjective:    Chief Complaint  Chief Complaint  Patient presents with  . URI    onset 3 days, with cough, congestion, sore throat, right earache    I connected with  [name]  on 02/25/20 at 11:20 AM EDT by a video enabled telemedicine application and verified that I am speaking with the correct person using two identifiers.  I discussed the limitations of evaluation and management by telemedicine and the availability of in person appointments. The patient expressed understanding and agreed to proceed. Staff also discussed with the patient that there may be a patient responsible charge related to this service. Patient Location: home Provider Location: [facility] Additional Individuals present: none  Pt was here last week for his CPE - had noted rhinosinusitis on exam, pt states nasal congestion, post nasal drip, scratchy throat, mild cough and ear discomfort (pressure/popping/decreased hearing) has gradually worsened over the past week.  No fever, SOB, sweats, chills, fatigue, CP.  Not tried any other meds or allergy medication, though he states "I think its allergies"  URI  This is a new problem. The current episode started in the past 7 days. The problem has been gradually worsening. There has been no fever. Associated symptoms include congestion, coughing, ear pain, rhinorrhea and a sore throat. Pertinent negatives include no abdominal pain, chest pain, diarrhea, dysuria, headaches, joint pain, joint swelling, nausea, neck pain, plugged ear sensation, rash, sinus pain, sneezing, swollen glands, vomiting or wheezing. Treatments tried: flonase. The treatment provided no relief.     Patient Active Problem List   Diagnosis Date Noted  . Epigastric pain 05/09/2017  . Lipoma of buttock 01/11/2017  . ED (erectile dysfunction) 12/01/2016  . Syncope 05/16/2016  . Sustained ventricular  tachycardia ([place]) 05/16/2016  . Ventricular tachycardia ([place]) 05/16/2016  . Prostate cancer screening 12/22/2015  . Hearing loss of both ears 12/22/2015  . Abnormal findings-gastrointestinal tract   . IBD (inflammatory bowel disease) 09/23/2015  . Pain in lower back 09/15/2015  . Night sweats 09/15/2015  . Groin pain 07/29/2015  . Screening for STD (sexually transmitted disease) 07/29/2015  . Preventative health care 05/10/2015  . Presence of cardiac defibrillator 05/10/2015  . Hypertension goal BP (blood pressure) < 140/90 12/13/2011  . H/O aortic valve replacement 08/08/2009  . Cardiomyopathy, secondary ([place]) 03/09/2009  . History of ventricular tachycardia 03/09/2009    Social History   Tobacco Use  . Smoking status: Never Smoker  . Smokeless tobacco: Never Used  Substance Use Topics  . Alcohol use: No     Current Outpatient Medications:  .  carvedilol (COREG) 12.5 MG tablet, Take 1 tablet (12.5 mg total) by mouth 2 (two) times daily., Disp: 180 tablet, Rfl: 3 .  fluticasone (FLONASE) 50 MCG/ACT nasal spray, Place 2 sprays into both nostrils daily., Disp: 16 g, Rfl: 2 .  loratadine (CLARITIN) 5 MG chewable tablet, Chew 1 tablet (5 mg total) by mouth daily as needed for allergies., Disp: 30 tablet, Rfl: 0 .  magnesium oxide (MAG-OX) 400 (241.3 Mg) MG tablet, Take 1 tablet (400 mg total) by mouth 2 (two) times daily., Disp: 60 tablet, Rfl: 0 .  meclizine (ANTIVERT) 25 MG tablet, Take 0.5-1 tablets (12.5-25 mg total) by mouth 3 (three) times daily as needed for dizziness., Disp: 30 tablet, Rfl: 2 .  Multiple Vitamin (MULTIVITAMIN) tablet, Take  1 tablet by mouth daily., Disp: , Rfl:  .  ranolazine (RANEXA) 1000 MG SR tablet, Take 1 tablet (1,000 mg total) by mouth 2 (two) times daily., Disp: 180 tablet, Rfl: 3 .  sacubitril-valsartan (ENTRESTO) 49-51 MG, Take 1 tablet by mouth 2 (two) times daily., Disp: 180 tablet, Rfl: 1 .  sildenafil (REVATIO) 20 MG tablet, Take 2-3 tablets  (40-60 mg total) by mouth daily as needed. Never combine with nitrates, Disp: 30 tablet, Rfl: 2 .  spironolactone (ALDACTONE) 25 MG tablet, TAKE ONE-HALF (1/2) TABLET DAILY, Disp: 45 tablet, Rfl: 3 .  triamcinolone cream (KENALOG) 0.1 %, Apply 1 application topically 2 (two) times daily as needed., Disp: 30 g, Rfl: 0 .  umeclidinium bromide (INCRUSE ELLIPTA) 62.5 MCG/INH AEPB, Inhale 1 puff into the lungs daily., Disp: 30 each, Rfl: 5  Allergies  Allergen Reactions  . Shellfish Allergy Nausea And Vomiting    I personally reviewed active problem list, medication list, allergies, family history, social history, health maintenance, notes from last encounter, lab results, imaging with the patient/caregiver today.   Review of Systems  HENT: Positive for congestion, ear pain, rhinorrhea and sore throat. Negative for sinus pain and sneezing.   Respiratory: Positive for cough. Negative for wheezing.   Cardiovascular: Negative for chest pain.  Gastrointestinal: Negative for abdominal pain, diarrhea, nausea and vomiting.  Genitourinary: Negative for dysuria.  Musculoskeletal: Negative for joint pain and neck pain.  Skin: Negative for rash.  Neurological: Negative for headaches.      Objective:   Virtual encounter, vitals limited, only able to obtain the following Today's Vitals   02/25/20 1044  Weight: 220 lb (99.8 kg)  Height: 5\' 10"  (1.778 m)   Body mass index is 31.57 kg/m. Nursing Note and Vital Signs reviewed.  Physical Exam Vitals reviewed.  Constitutional:      General: He is not in acute distress.    Appearance: Normal appearance. He is not ill-appearing, toxic-appearing or diaphoretic.  HENT:     Head:     Comments: Normal appearing inspection of face/outer ear/nose/ mouth on video encounter  Normal phonation No coughing    Right Ear: External ear normal.     Left Ear: External ear normal.  Pulmonary:     Effort: No respiratory distress.  Neurological:     Mental  Status: He is alert.     PE limited by telephone encounter  No results found for this or any previous visit (from the past 72 hour(s)).  Assessment and Plan:     ICD-10-CM   1. Upper respiratory tract infection, unspecified type  J06.9    supportive and sx tx, no concern for acute bacterial sinusitis now  2. Seasonal allergies  J30.2    start daily antihistamine with flonase  3. Suspected COVID-19 virus infection  Z20.822    URI sx may be allergic vs viral?  advised covid testing and quarantine until results  4. Dysfunction of Eustachian tube, unspecified laterality  H69.80    ears bothering him, encouraged adding antihistamines and decongestatnts to flonase for the next several weeks - f/up with ENT if not improving - offered referra     -Red flags and when to present for emergency care or RTC including fever >101.41F, chest pain, shortness of breath, new/worsening/un-resolving symptoms, reviewed with patient at time of visit. Follow up and care instructions discussed and provided in AVS. - I discussed the assessment and treatment plan with the patient. The patient was provided an opportunity to ask  questions and all were answered. The patient agreed with the plan and demonstrated an understanding of the instructions.  I provided 20+ minutes of non-face-to-face time during this encounter.  Delsa Grana, PA-C 02/25/20 11:49 AM

## 2020-03-16 ENCOUNTER — Other Ambulatory Visit: Payer: Self-pay | Admitting: Physician Assistant

## 2020-03-16 DIAGNOSIS — H90A21 Sensorineural hearing loss, unilateral, right ear, with restricted hearing on the contralateral side: Secondary | ICD-10-CM

## 2020-03-24 ENCOUNTER — Other Ambulatory Visit: Payer: Self-pay

## 2020-03-24 ENCOUNTER — Other Ambulatory Visit: Payer: Self-pay | Admitting: Physician Assistant

## 2020-03-24 ENCOUNTER — Ambulatory Visit
Admission: RE | Admit: 2020-03-24 | Discharge: 2020-03-24 | Disposition: A | Discharge status: Home / Self Care | Payer: 59 | Source: Ambulatory Visit | Attending: Physician Assistant | Admitting: Physician Assistant

## 2020-03-24 DIAGNOSIS — H90A21 Sensorineural hearing loss, unilateral, right ear, with restricted hearing on the contralateral side: Secondary | ICD-10-CM

## 2020-03-24 MED ORDER — IOPAMIDOL (ISOVUE-300) INJECTION 61%: 75.0000 mL | Freq: Once | INTRAVENOUS | Status: DC | PRN

## 2020-05-12 ENCOUNTER — Ambulatory Visit (INDEPENDENT_AMBULATORY_CARE_PROVIDER_SITE_OTHER): Payer: 59

## 2020-05-12 DIAGNOSIS — I472 Ventricular tachycardia, unspecified: Secondary | ICD-10-CM

## 2020-05-12 LAB — CUP PACEART REMOTE DEVICE CHECK
Battery Remaining Longevity: 78 mo
Battery Remaining Percentage: 84 %
Brady Statistic RV Percent Paced: 0 %
Date Time Interrogation Session: 20211223030100
HighPow Impedance: 56 Ohm
Implantable Lead Implant Date: 20080214
Implantable Lead Location: 753860
Implantable Lead Model: 185
Implantable Lead Serial Number: 178017
Implantable Pulse Generator Implant Date: 20141216
Lead Channel Impedance Value: 655 Ohm
Lead Channel Pacing Threshold Amplitude: 1.1 V
Lead Channel Pacing Threshold Pulse Width: 0.4 ms
Lead Channel Setting Pacing Amplitude: 2 V
Lead Channel Setting Pacing Pulse Width: 0.4 ms
Lead Channel Setting Sensing Sensitivity: 0.6 mV
Pulse Gen Serial Number: 126781

## 2020-05-26 NOTE — Progress Notes (Signed)
Remote ICD transmission.   

## 2020-06-27 ENCOUNTER — Other Ambulatory Visit: Payer: Self-pay | Admitting: Internal Medicine

## 2020-07-04 ENCOUNTER — Other Ambulatory Visit: Payer: Self-pay | Admitting: Internal Medicine

## 2020-07-12 ENCOUNTER — Ambulatory Visit (INDEPENDENT_AMBULATORY_CARE_PROVIDER_SITE_OTHER): Payer: 59 | Admitting: Internal Medicine

## 2020-07-12 ENCOUNTER — Other Ambulatory Visit: Payer: Self-pay

## 2020-07-12 ENCOUNTER — Encounter: Payer: Self-pay | Admitting: Internal Medicine

## 2020-07-12 VITALS — BP 120/70 | HR 71 | Ht 70.0 in | Wt 216.0 lb

## 2020-07-12 DIAGNOSIS — I429 Cardiomyopathy, unspecified: Secondary | ICD-10-CM | POA: Diagnosis not present

## 2020-07-12 DIAGNOSIS — Z13220 Encounter for screening for lipoid disorders: Secondary | ICD-10-CM

## 2020-07-12 DIAGNOSIS — Z79899 Other long term (current) drug therapy: Secondary | ICD-10-CM

## 2020-07-12 LAB — CUP PACEART INCLINIC DEVICE CHECK
Brady Statistic RV Percent Paced: 1 % — CL
Date Time Interrogation Session: 20220222170632
HighPow Impedance: 42 Ohm
HighPow Impedance: 56 Ohm
Implantable Lead Implant Date: 20080214
Implantable Lead Location: 753860
Implantable Lead Model: 185
Implantable Lead Serial Number: 178017
Implantable Pulse Generator Implant Date: 20141216
Lead Channel Impedance Value: 659 Ohm
Lead Channel Pacing Threshold Amplitude: 1 V
Lead Channel Pacing Threshold Pulse Width: 0.4 ms
Lead Channel Sensing Intrinsic Amplitude: 17 mV
Lead Channel Setting Pacing Amplitude: 2 V
Lead Channel Setting Pacing Pulse Width: 0.4 ms
Lead Channel Setting Sensing Sensitivity: 0.6 mV
Pulse Gen Serial Number: 126781

## 2020-07-12 LAB — PACEMAKER DEVICE OBSERVATION

## 2020-07-12 NOTE — Patient Instructions (Addendum)
Medication Instructions:  Your physician recommends that you continue on your current medications as directed. Please refer to the Current Medication list given to you today.  *If you need a refill on your cardiac medications before your next appointment, please call your pharmacy*   Lab Work: Your physician recommends that you return for lab work in: TODAY - LIPID.  If you have labs (blood work) drawn today and your tests are completely normal, you will receive your results only by: Marland Kitchen MyChart Message (if you have MyChart) OR . A paper copy in the mail If you have any lab test that is abnormal or we need to change your treatment, we will call you to review the results.   Testing/Procedures: none  Follow-Up: At [facility], you and your health needs are our priority.  As part of our continuing mission to provide you with exceptional heart care, we have created designated Provider Care Teams.  These Care Teams include your primary Cardiologist (physician) and Advanced Practice Providers (APPs -  Physician Assistants and Nurse Practitioners) who all work together to provide you with the care you need, when you need it.  We recommend signing up for the patient portal called "MyChart".  Sign up information is provided on this After Visit Summary.  MyChart is used to connect with patients for Virtual Visits (Telemedicine).  Patients are able to view lab/test results, encounter notes, upcoming appointments, etc.  Non-urgent messages can be sent to your provider as well.   To learn more about what you can do with MyChart, go to [email or web address].    Your next appointment:   6 month(s)  The format for your next appointment:   In Person  Provider:   Virl Axe, MD

## 2020-07-12 NOTE — Progress Notes (Signed)
Patient Care Team: Delsa Grana, PA-C as PCP - General (Family Medicine) Sanda Sanari Offner Satira Anis, MD as Attending Physician (Family Medicine) Deboraha Sprang, MD as Consulting Physician (Cardiology)   HPI  [name] is a 45 y.o. male Seen in followup for ventricular tachycardia occurring in the context of surgically corrected congenital heart disease With Aortic valve replacement.  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  My note from 2011 reports that at the time of surgery "he had damage to his right coronary artery and underwent repair." He is status post ICD implantation. 4/13 had appropriate shock for fast ventricular tachycardia He underwent device generator replacement 12/14. 12/17  Admitted [facility] for syncopal VT- PM  Antecedent VT PM in wake of viral illness  Ranexa and MAG added He has had interval VTNS monomorphic and polymorphic  2/20 --abrupt onset of presyncope prompting him to sit on the floor and about 15 seconds the episode had resolved.  Device interrogation demonstrates rapid monomorphic VT with acceleration with ATP and then termination spontaneously at the moment of charging being completed.  The shock was diverted.    Has developed AFib-paroxysmal   Referred 2/21 to [facility] for consideration of VT ablation -briefly summarized as normal endocardial voltage.  Thought to be epicardial.  Mapping of the CS was complicated by dissection.  VTs were hemodynamically unstable and " Given the inability to activation map due to hemodynamically unstable VT and inability to access the epicardial space, the case was concluded at this time.  Anxiety re next therapy   No chest pain.  No shocks.  Mild edema.  Mild dyspnea  Has started a vegetarian diet   DATE TEST EF   3/13    Echo 40-45 % IP akinesis   1/15    TEE  60 % "mechanical aortic valve"  2/17  Echo 45-50%   12/17 Echo  35-40% "bioprosthesis "  5/17 Echo  30-35%   8/18 CT 30-35% RCA Total without flow//aneurysm Inferior Wall LAD & CX w FFR > 80%  9/21 Echo  35-40% Bioprosthesis -- AoV mean grad 33       Antiarrhythmics Date Reason stopped  Ranolazine 12/17     Date Cr K  12/18 1.24 4.3   11/19 1.14 3.8  9/20 1.17 4.5  9/21 1.25 4.8          DATE TEST    6/18    HOLTER <1 % PVCs              Past Medical History:  Diagnosis Date  . Anxiety   . Aortic valve replaced    Requiring replacement, specifics not available  . Hearing loss of both ears 12/22/2015  . Heart murmur   . Hypertension   . IBD (inflammatory bowel disease) 09/23/2015  . ICD (implantable cardiac defibrillator), BSX single   . Obesity 12/22/2015  . Secondary cardiomyopathy ([place])   . Syncope   . Ventricular septal defect   . Ventricular tachycardia ([place])    appropriate VT shock therapy /13    Past Surgical History:  Procedure Laterality Date  . ANGIOPLASTY     RCA repair with vein angioplasty following injury with the aforementioned surgery  . AORTIC VALVE REPLACEMENT     Bioprosthesis  . CARDIAC DEFIBRILLATOR PLACEMENT    . CARDIAC VALVE REPLACEMENT    . CHOLECYSTECTOMY    .  COLONOSCOPY WITH PROPOFOL N/A 12/06/2015   Procedure: COLONOSCOPY WITH PROPOFOL;  Surgeon: [name], MD;  Location: [facility] ENDOSCOPY;  Service: Endoscopy;  Laterality: N/A;  . CORONARY ARTERY BYPASS GRAFT    . IMPLANTABLE CARDIOVERTER DEFIBRILLATOR GENERATOR CHANGE N/A 05/05/2013   Procedure: IMPLANTABLE CARDIOVERTER DEFIBRILLATOR GENERATOR CHANGE;  Surgeon: Deboraha Sprang, MD;  Location: [facility] CATH LAB;  Service: Cardiovascular;  Laterality: N/A;  . INSERT / REPLACE / REMOVE PACEMAKER    . VSD REPAIR      Current Outpatient Medications  Medication Sig Dispense Refill  . carvedilol (COREG) 12.5 MG tablet Take 1 tablet (12.5 mg total) by mouth 2 (two) times daily. 180 tablet 3  . fluticasone (FLONASE) 50 MCG/ACT  nasal spray Place 2 sprays into both nostrils daily. 16 g 2  . loratadine (CLARITIN) 5 MG chewable tablet Chew 1 tablet (5 mg total) by mouth daily as needed for allergies. 30 tablet 0  . magnesium oxide (MAG-OX) 400 (241.3 Mg) MG tablet Take 1 tablet (400 mg total) by mouth 2 (two) times daily. 60 tablet 0  . meclizine (ANTIVERT) 25 MG tablet Take 0.5-1 tablets (12.5-25 mg total) by mouth 3 (three) times daily as needed for dizziness. 30 tablet 2  . Multiple Vitamin (MULTIVITAMIN) tablet Take 1 tablet by mouth daily.    . ranolazine (RANEXA) 1000 MG SR tablet Take 1 tablet (1,000 mg total) by mouth 2 (two) times daily. 180 tablet 3  . sacubitril-valsartan (ENTRESTO) 49-51 MG Take 1 tablet by mouth 2 (two) times daily. 180 tablet 1  . sildenafil (REVATIO) 20 MG tablet Take 2-3 tablets (40-60 mg total) by mouth daily as needed. Never combine with nitrates 30 tablet 2  . spironolactone (ALDACTONE) 25 MG tablet TAKE ONE-HALF (1/2) TABLET DAILY 45 tablet 0  . triamcinolone cream (KENALOG) 0.1 % Apply 1 application topically 2 (two) times daily as needed. 30 g 0  . umeclidinium bromide (INCRUSE ELLIPTA) 62.5 MCG/INH AEPB Inhale 1 puff into the lungs daily. 30 each 5   No current facility-administered medications for this visit.    Allergies  Allergen Reactions  . Shellfish Allergy Nausea And Vomiting    Review of Systems negative except from HPI and PMH  Physical Exam BP 120/70 (BP Location: Left Arm, Patient Position: Sitting, Cuff Size: Large)   Pulse 71   Ht 5\' 10"  (1.778 m)   Wt 216 lb (98 kg)   BMI 30.99 kg/m  Well developed and well nourished in no acute distress HENT normal Neck supple with JVP-flat Clear Device pocket well healed; without hematoma or erythema.  There is no tethering  Regular rate and rhythm, 2/6 murmur Abd-soft with active BS No Clubbing cyanosis   edema Skin-warm and dry A & Oriented  Grossly normal sensory and motor function  ECG sinus at 71 intervals  15/13/43 PACs-frequent  Assessment and plan  Ventricular tachycardia-recurrent  Hypertension   Aortic valve-bioprosthesis   AS mod-severe//AI mod  ICD-[employer]    Cardiomyopathy-hypertrophic/ischemic-secondary  PVCs-infrequent  RCA No flow, presumed 2/2 surgery As above   Intercurrent nonsustained ventricular tachycardia occurring in the context of sexual activity and sinus tachycardia.  However, previous VT has not been so associated.  We discussed the role of [pre-coital beta-blockers; decided to hold off as this was an isolated event.  We will continue ranolazine  Discussed the role of his aortic stenosis.  Have reached out to Dr. [facility] to engage his expertise to follow this gentleman with a nearly 47 year old bioprosthetic valve  now with aortic stenosis  On his new diet, will check his lipids

## 2020-07-13 LAB — LIPID PANEL
Chol/HDL Ratio: 4.1 ratio (ref 0.0–5.0)
Cholesterol, Total: 156 mg/dL (ref 100–199)
HDL: 38 mg/dL — ABNORMAL LOW (ref >39)
LDL Chol Calc (NIH): 98 mg/dL (ref 0–99)
Triglycerides: 110 mg/dL (ref 0–149)
VLDL Cholesterol Cal: 20 mg/dL (ref 5–40)

## 2020-08-11 ENCOUNTER — Ambulatory Visit (INDEPENDENT_AMBULATORY_CARE_PROVIDER_SITE_OTHER): Payer: 59

## 2020-08-11 DIAGNOSIS — I429 Cardiomyopathy, unspecified: Secondary | ICD-10-CM

## 2020-08-12 LAB — CUP PACEART REMOTE DEVICE CHECK
Battery Remaining Longevity: 78 mo
Battery Remaining Percentage: 81 %
Brady Statistic RV Percent Paced: 0 %
Date Time Interrogation Session: 20220324030100
HighPow Impedance: 54 Ohm
Implantable Lead Implant Date: 20080214
Implantable Lead Location: 753860
Implantable Lead Model: 185
Implantable Lead Serial Number: 178017
Implantable Pulse Generator Implant Date: 20141216
Lead Channel Impedance Value: 660 Ohm
Lead Channel Pacing Threshold Amplitude: 1 V
Lead Channel Pacing Threshold Pulse Width: 0.4 ms
Lead Channel Setting Pacing Amplitude: 2 V
Lead Channel Setting Pacing Pulse Width: 0.4 ms
Lead Channel Setting Sensing Sensitivity: 0.6 mV
Pulse Gen Serial Number: 126781

## 2020-08-23 NOTE — Progress Notes (Signed)
Remote ICD transmission.   

## 2020-08-24 ENCOUNTER — Other Ambulatory Visit: Payer: Self-pay

## 2020-08-24 ENCOUNTER — Encounter: Payer: Self-pay | Admitting: Cardiovascular Disease

## 2020-08-24 ENCOUNTER — Ambulatory Visit: Payer: 59 | Admitting: Cardiovascular Disease

## 2020-08-24 VITALS — BP 104/76 | HR 70 | Ht 70.0 in | Wt 209.2 lb

## 2020-08-24 DIAGNOSIS — I5022 Chronic systolic (congestive) heart failure: Secondary | ICD-10-CM | POA: Diagnosis not present

## 2020-08-24 DIAGNOSIS — T8209XA Other mechanical complication of heart valve prosthesis, initial encounter: Secondary | ICD-10-CM | POA: Diagnosis not present

## 2020-08-24 NOTE — Patient Instructions (Signed)
Medication Instructions:  Your provider recommends that you continue on your current medications as directed. Please refer to the Current Medication list given to you today.   *If you need a refill on your cardiac medications before your next appointment, please call your pharmacy*  Testing/Procedures: Your provider has requested that you have an echocardiogram. Echocardiography is a painless test that uses sound waves to create images of your heart. It provides your doctor with information about the size and shape of your heart and how well your heart's chambers and valves are working. This procedure takes approximately one hour. There are no restrictions for this procedure.  Follow-Up: You will be called to arrange your echo and office visits for September 2023.

## 2020-08-24 NOTE — Progress Notes (Signed)
Cardiology Office Note:    Date:  08/24/2020   ID:  [name], DOB 1975/08/02, MRN [id number]  PCP:  Tapia, Leisa, [place]  Cardiologist:  No primary care provider on file.  Advanced Practice Provider:  No care team member to display Electrophysiologist:  None       Referring MD: Delsa Grana, PA-C   Chief Complaint  Patient presents with  . Aortic Stenosis    History of Present Illness:    [name] is a 46 y.o. male presents for evaluation of valvular heart disease.  The patient had aortic valve replacement many years ago.  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  He has undergone ICD implantation has had recurrent VT, managed by Dr. Caryl Comes.  The patient has had serial echoes over time with his LVEF generally ranging somewhere between 35 and 45%.  His most recent echo in September 2021 demonstrated an aortic valve mean gradient of 33 mmHg.  The patient is here alone today. As a teen he was found to have a VSD on a sports participation physical. He underwent surgical VSD repair at 45 years old ([place]). He was then found to have an aortic valve problem at age 8 and underwent bioprosthetic aortic valve replacement ([place]). During that surgery there was an injury to the RCA and he had an infarct at that time. He was found to have episodes of VT in 2008 and was treated with medical therapy and an ICD. He has had a few appropriate shocks for VT over the years and has had ATP therapies delivered as well. He was evaluated for VT ablation at [facility] but he was found to have an epicardial source and did not proceed with ablation.   The patient is here alone today.  He denies any symptoms of chest pain, shortness of breath, edema, orthopnea, or PND.  He has made some changes in his diet and is essentially following a vegetarian diet now.  He has lost some weight and has noticed  that his blood pressure is a little lower than in the past.  He wonders if he is taking too much medication for heart failure.  He is going to keep an eye on this.  The vast majority of his systolic blood pressure readings remain greater than 100 mmHg.  The patient denies edema, orthopnea, or PND.  Past Medical History:  Diagnosis Date  . Anxiety   . Aortic valve replaced    Requiring replacement, specifics not available  . Hearing loss of both ears 12/22/2015  . Heart murmur   . Hypertension   . IBD (inflammatory bowel disease) 09/23/2015  . ICD (implantable cardiac defibrillator), BSX single   . Obesity 12/22/2015  . Secondary cardiomyopathy ([place])   . Syncope   . Ventricular septal defect   . Ventricular tachycardia ([place])    appropriate VT shock therapy /13    Past Surgical History:  Procedure Laterality Date  . ANGIOPLASTY     RCA repair with vein angioplasty following injury with the aforementioned surgery  . AORTIC VALVE REPLACEMENT     Bioprosthesis  . CARDIAC DEFIBRILLATOR PLACEMENT    . CARDIAC VALVE REPLACEMENT    . CHOLECYSTECTOMY    . COLONOSCOPY WITH PROPOFOL N/A 12/06/2015   Procedure: COLONOSCOPY WITH PROPOFOL;  Surgeon: [name], MD;  Location: [facility] ENDOSCOPY;  Service: Endoscopy;  Laterality: N/A;  .  CORONARY ARTERY BYPASS GRAFT    . IMPLANTABLE CARDIOVERTER DEFIBRILLATOR GENERATOR CHANGE N/A 05/05/2013   Procedure: IMPLANTABLE CARDIOVERTER DEFIBRILLATOR GENERATOR CHANGE;  Surgeon: Deboraha Sprang, MD;  Location: [facility] C[facility] LAB;  Service: Cardiovascular;  Laterality: N/A;  . INSERT / REPLACE / REMOVE PACEMAKER    . VSD REPAIR      Current Medications: Current Meds  Medication Sig  . carvedilol (COREG) 12.5 MG tablet Take 1 tablet (12.5 mg total) by mouth 2 (two) times daily.  Marland Kitchen ENTRESTO 49-51 MG TAKE 1 TABLET TWICE A DAY  . fluticasone (FLONASE) 50 MCG/ACT nasal spray Place 2 sprays into both nostrils daily.  Marland Kitchen loratadine (CLARITIN) 5 MG chewable tablet Chew 1  tablet (5 mg total) by mouth daily as needed for allergies.  . magnesium oxide (MAG-OX) 400 (241.3 Mg) MG tablet Take 1 tablet (400 mg total) by mouth 2 (two) times daily.  . meclizine (ANTIVERT) 25 MG tablet Take 0.5-1 tablets (12.5-25 mg total) by mouth 3 (three) times daily as needed for dizziness.  . Multiple Vitamin (MULTIVITAMIN) tablet Take 1 tablet by mouth daily.  . ranolazine (RANEXA) 1000 MG SR tablet Take 1 tablet (1,000 mg total) by mouth 2 (two) times daily.  . sildenafil (REVATIO) 20 MG tablet Take 2-3 tablets (40-60 mg total) by mouth daily as needed. Never combine with nitrates  . spironolactone (ALDACTONE) 25 MG tablet TAKE ONE-HALF (1/2) TABLET DAILY  . triamcinolone cream (KENALOG) 0.1 % Apply 1 application topically 2 (two) times daily as needed.  . umeclidinium bromide (INCRUSE ELLIPTA) 62.5 MCG/INH AEPB Inhale 1 puff into the lungs daily.     Allergies:   Shellfish allergy   Social History   Socioeconomic History  . Marital status: Married    Spouse name: Anderson Malta  . Number of children: 2  . Years of education: 39  . Highest education level: Bachelor's degree (e.g., BA, AB, BS)  Occupational History  . 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 Smoking status: Never Smoker  . Smokeless tobacco: Never Used  Vaping Use  . Vaping Use: Never used  Substance and Sexual Activity  . Alcohol use: No  . Drug use: No  . Sexual activity: Yes    Partners: Female  Other Topics Concern  . Not on file  Social History Narrative   Married   Social Determinants of Health   Financial Resource Strain: Low Risk   . Difficulty of Paying Living Expenses: Not hard at all  Food Insecurity: No Food Insecurity  . Worried About Charity fundraiser in the Last Year: Never true  . Ran Out of Food in the Last Year: Never true  Transportation Needs: No Transportation Needs  . Lack of Transportation (Medical): No  . Lack of  Transportation (Non-Medical): No  Physical Activity: Inactive  . Days of Exercise per Week: 0 days  . Minutes of Exercise per Session: 0 min  Stress: No Stress Concern Present  . Feeling of Stress : Not at all  Social Connections: Moderately Integrated  . Frequency of Communication with Friends and Family: Twice a week  . Frequency of Social Gatherings with Friends and Family: Twice a week  . Attends Religious Services: 1 to 4 times per year  . Active Member of Clubs or Organizations: No  . Attends Archivist Meetings: Never  . Marital Status: Married     Family  History: The patient's family history includes Cancer in his maternal grandfather and maternal grandmother; Heart disease in his father; Hypertension in his maternal grandfather and maternal grandmother.  ROS:   Please see the history of present illness.    All other systems reviewed and are negative.  EKGs/Labs/Other Studies Reviewed:    The following studies were reviewed today: Echo 02/02/2020: IMPRESSIONS    1. Left ventricular ejection fraction, by estimation, is 35 to 40%. The  left ventricle has moderately decreased function. The left ventricle  demonstrates regional wall motion abnormalities (see scoring  diagram/findings for description). The left  ventricular internal cavity size was severely dilated. Left ventricular  diastolic parameters are indeterminate. There is severe hypokinesis of the  left ventricular, basal-mid inferior wall and inferolateral wall.  2. Right ventricular systolic function is mildly reduced. The right  ventricular size is normal.  3. Left atrial size was mildly dilated.  4. The mitral valve is abnormal. No evidence of mitral valve  regurgitation.  5. The aortic valve has an indeterminant number of cusps. There is mild  calcification of the aortic valve. There is severe thickening of the  aortic valve. Aortic valve regurgitation is mild to moderate. Moderate  aortic  valve stenosis. There is a  Bioprosthetic AVR valve present in the aortic position. Procedure Date:  2002-2005. Aortic valve mean gradient measures 33.0 mmHg. Aortic valve  Vmax measures 3.88 m/s.  6. The inferior vena cava is normal in size with greater than 50%  respiratory variability, suggesting right atrial pressure of 3 mmHg.   FINDINGS  Left Ventricle: Left ventricular ejection fraction, by estimation, is 35  to 40%. The left ventricle has moderately decreased function. The left  ventricle demonstrates regional wall motion abnormalities. Severe  hypokinesis of the left ventricular,  basal-mid inferior wall and inferolateral wall. The left ventricular  internal cavity size was severely dilated. There is no left ventricular  hypertrophy. Left ventricular diastolic parameters are indeterminate.   Right Ventricle: The right ventricular size is normal. No increase in  right ventricular wall thickness. Right ventricular systolic function is  mildly reduced.   Left Atrium: Left atrial size was mildly dilated.   Right Atrium: Right atrial size was normal in size.   Pericardium: There is no evidence of pericardial effusion.   Mitral Valve: The mitral valve is abnormal. There is mild thickening of  the mitral valve leaflet(s). No evidence of mitral valve regurgitation.   Tricuspid Valve: The tricuspid valve is not well visualized. Tricuspid  valve regurgitation is trivial.   Aortic Valve: The aortic valve has an indeterminant number of cusps. There  is mild calcification of the aortic valve. There is severe thickening of  the aortic valve. Aortic valve regurgitation is mild to moderate. Aortic  regurgitation PHT measures 491  msec. Moderate aortic stenosis is present. Aortic valve mean gradient  measures 33.0 mmHg. Aortic valve peak gradient measures 60.2 mmHg. Aortic  valve area, by VTI measures 1.16 cm. There is a Bioprosthetic AVR valve  present in the aortic position.   Procedure Date: 2002-2005.   Pulmonic Valve: The pulmonic valve was not well visualized. Pulmonic valve  regurgitation is not visualized. No evidence of pulmonic stenosis.   Aorta: The aortic root and ascending aorta are structurally normal, with  no evidence of dilitation.   Pulmonary Artery: The pulmonary artery is not well seen.   Venous: The inferior vena cava is normal in size with greater than 50%  respiratory variability, suggesting right  atrial pressure of 3 mmHg.   IAS/Shunts: The interatrial septum was not assessed.     LEFT VENTRICLE  PLAX 2D  LVIDd:     6.90 cm   Diastology  LVIDs:     5.10 cm   LV e' medial:  7.72 cm/s  LV PW:     0.90 cm   LV E/e' medial: 6.8  LV IVS:    0.90 cm   LV e' lateral:  8.27 cm/s  LVOT diam:   2.20 cm   LV E/e' lateral: 6.3  LV SV:     88  LV SV Index:  40  LVOT Area:   3.80 cm    LV Volumes (MOD)  LV vol d, MOD A4C: 183.0 ml  LV vol s, MOD A4C: 123.0 ml  LV SV MOD A4C:   183.0 ml   RIGHT VENTRICLE      IVC  RV Basal diam: 3.00 cm  IVC diam: 1.40 cm  RV S prime:   9.57 cm/s   LEFT ATRIUM      Index    RIGHT ATRIUM      Index  LA diam:   4.10 cm 1.88 cm/m RA Area:   15.30 cm  LA Vol (A2C): 42.6 ml 19.52 ml/m RA Volume:  44.70 ml 20.49 ml/m  LA Vol (A4C): 48.5 ml 22.23 ml/m  AORTIC VALVE          PULMONIC VALVE  AV Area (Vmax):  1.33 cm   PV Vmax:    0.84 m/s  AV Area (Vmean):  1.14 cm   PV Peak grad: 2.8 mmHg  AV Area (VTI):   1.16 cm  AV Vmax:      388.00 cm/s  AV Vmean:     260.000 cm/s  AV VTI:      0.760 m  AV Peak Grad:   60.2 mmHg  AV Mean Grad:   33.0 mmHg  LVOT Vmax:     136.00 cm/s  LVOT Vmean:    77.700 cm/s  LVOT VTI:     0.232 m  LVOT/AV VTI ratio: 0.31  AI PHT:      491 msec    AORTA  Ao Root diam: 2.90 cm  Ao Asc diam: 3.10 cm  Ao Arch diam: 3.0 cm    MITRAL VALVE  MV Area (PHT): 3.27 cm  SHUNTS  MV Decel Time: 232 msec  Systemic VTI: 0.23 m  MV E velocity: 52.40 cm/s Systemic Diam: 2.20 cm  MV A velocity: 64.10 cm/s  MV E/A ratio: 0.82   EKG:  EKG is ordered today.  The ekg ordered today demonstrates normal sinus rhythm, age-indeterminate septal infarct, frequent PACs with paired beats  Recent Labs: 02/17/2020: ALT 39; BUN 14; Creat 1.25; Hemoglobin 14.1; Platelets 163; Potassium 4.8; Sodium 144  Recent Lipid Panel    Component Value Date/Time   CHOL 156 07/12/2020 1135   TRIG 110 07/12/2020 1135   HDL 38 (L) 07/12/2020 1135   CHOLHDL 4.1 07/12/2020 1135   CHOLHDL 4.5 02/17/2020 1130   LDLCALC 98 07/12/2020 1135   LDLCALC 106 (H) 02/17/2020 1130     Risk Assessment/Calculations:       Physical Exam:    VS:  BP 104/76   Pulse 70   Ht 5\' 10"  (1.778 m)   Wt 209 lb 3.2 oz (94.9 kg)   SpO2 98%   BMI 30.02 kg/m     Wt Readings from Last 3 Encounters:  08/24/20 209 lb  3.2 oz (94.9 kg)  07/12/20 216 lb (98 kg)  02/25/20 220 lb (99.8 kg)     GEN:  Well nourished, well developed in no acute distress HEENT: Normal NECK: No JVD; No carotid bruits LYMPHATICS: No lymphadenopathy CARDIAC: RRR, 2/6 crescendo decrescendo murmur at the right upper sternal border RESPIRATORY:  Clear to auscultation without rales, wheezing or rhonchi  ABDOMEN: Soft, non-tender, non-distended MUSCULOSKELETAL:  No edema; No deformity  SKIN: Warm and dry NEUROLOGIC:  Alert and oriented x 3 PSYCHIATRIC:  Normal affect   ASSESSMENT:    1. Prosthetic valve dysfunction, initial encounter   2. Chronic systolic heart failure (HCC)    PLAN:    In order of problems listed above:  1. Reviewed the natural history of bioprosthetic valve dysfunction with the patient at length today.  He is about 23 years out from redo heart surgery for treatment of aortic valve dysfunction, reportedly treated with a bioprosthesis.  Will try to get his  operative records if possible.  I reviewed his echoes over the last 9 to 10 years within our system.  In 2013, his LVEF was 40 to 45%, peak and mean transaortic gradient is 24 and 14 mmHg, respectively, and peak transaortic velocity 2.45 m/s.  In 2017 his LVEF was 45 to 50%, peak and mean transvalvular gradients 51 and 25 mmHg respectively, and peak transaortic velocity 3.57 m/s.  In 2021, his LVEF is 35 to 40%, peak and mean transvalvular gradients of 60 and 33 mmHg respectively, and peak transaortic velocity 3.88 m/s.  This shows progressive bioprosthetic aortic stenosis over the last 3 to 4 years.  The patient does not appear to have any symptoms related to this.  We discussed keeping an eye out for symptoms of exertional shortness of breath, chest discomfort, or lightheadedness.  I recommended that he have a repeat echocardiogram in September of this year.  We will touch base at the time of his echo once it is resulted.  I would like to see him annually with an echocardiogram.  I think there is a relatively high likelihood that he will require aortic valve intervention over the next 3 to 4 years.  We discussed potential treatment options including redo surgery versus transcatheter valve therapies.  Considering his young age, redo surgery with mechanical prosthesis would likely be indicated as long as his 2 prior surgeries are not a contraindication.  The patient understands this and we had specific discussion about long-term warfarin, valve longevity, and plans for close surveillance. 2. The patient is changed his diet and lost some weight.  He is concerned that his blood pressure might be running a little too low.  We reviewed a bunch of readings today and his blood pressures are ranging from the high 90s more often between 009-233 systolic.  I asked him to keep an eye on this and continue a combination of spironolactone, Entresto, and carvedilol for now.  He will touch base with Dr. Caryl Comes if symptoms worsen  or if he feels like he needs to decrease his medical therapy.        Medication Adjustments/Labs and Tests Ordered: Current medicines are reviewed at length with the patient today.  Concerns regarding medicines are outlined above.  Orders Placed This Encounter  Procedures  . EKG 12-Lead  . ECHOCARDIOGRAM COMPLETE   No orders of the defined types were placed in this encounter.   Patient Instructions  Medication Instructions:  Your provider recommends that you continue on your current medications as  directed. Please refer to the Current Medication list given to you today.   *If you need a refill on your cardiac medications before your next appointment, please call your pharmacy*  Testing/Procedures: Your provider has requested that you have an echocardiogram. Echocardiography is a painless test that uses sound waves to create images of your heart. It provides your doctor with information about the size and shape of your heart and how well your heart's chambers and valves are working. This procedure takes approximately one hour. There are no restrictions for this procedure.  Follow-Up: You will be called to arrange your echo and office visits for September 2023.    Signed, Sherren Mocha, MD  08/24/2020 5:33 PM    [facility]

## 2020-09-05 ENCOUNTER — Telehealth: Payer: Self-pay | Admitting: Internal Medicine

## 2020-09-05 MED ORDER — SPIRONOLACTONE 25 MG PO TABS: 0.5000 | ORAL_TABLET | Freq: Every day | ORAL | 0 refills | Status: DC

## 2020-09-05 MED ORDER — CARVEDILOL 12.5 MG PO TABS: 12.5000 mg | ORAL_TABLET | Freq: Two times a day (BID) | ORAL | 0 refills | Status: DC

## 2020-09-05 NOTE — Telephone Encounter (Signed)
Requested Prescriptions   Signed Prescriptions Disp Refills  . carvedilol (COREG) 12.5 MG tablet 180 tablet 0    Sig: Take 1 tablet (12.5 mg total) by mouth 2 (two) times daily.    Authorizing Provider: Deboraha Sprang    Ordering User: Britt Bottom spironolactone (ALDACTONE) 25 MG tablet 45 tablet 0    Sig: Take 0.5 tablets (12.5 mg total) by mouth daily.    Authorizing Provider: Deboraha Sprang    Ordering User: Britt Bottom

## 2020-09-05 NOTE — Telephone Encounter (Signed)
*  STAT* If patient is at the pharmacy, call can be transferred to refill team.   1. Which medications need to be refilled? (please list name of each medication and dose if known)   Carvedilol 12.5 mg po BID Spironolactone 12.5 mg po q d   2. Which pharmacy/location (including street and city if local pharmacy) is medication to be sent to? [facility]   Temporary pharmacy  3. Do they need a 30 day or 90 day supply? 90  PATIENT REQUESTING TO EXPEDITE AS HE IS OUT OF TOWN AND FORGOT TO BRING THESE WITH HIM

## 2020-09-26 ENCOUNTER — Other Ambulatory Visit: Payer: Self-pay | Admitting: Internal Medicine

## 2020-09-27 NOTE — Telephone Encounter (Signed)
This is a [place] pt 

## 2020-11-10 ENCOUNTER — Ambulatory Visit (INDEPENDENT_AMBULATORY_CARE_PROVIDER_SITE_OTHER): Payer: 59

## 2020-11-10 DIAGNOSIS — I429 Cardiomyopathy, unspecified: Secondary | ICD-10-CM

## 2020-11-10 LAB — CUP PACEART REMOTE DEVICE CHECK
Battery Remaining Longevity: 72 mo
Battery Remaining Percentage: 77 %
Brady Statistic RV Percent Paced: 0 %
Date Time Interrogation Session: 20220623030100
HighPow Impedance: 53 Ohm
Implantable Lead Implant Date: 20080214
Implantable Lead Location: 753860
Implantable Lead Model: 185
Implantable Lead Serial Number: 178017
Implantable Pulse Generator Implant Date: 20141216
Lead Channel Impedance Value: 664 Ohm
Lead Channel Pacing Threshold Amplitude: 1 V
Lead Channel Pacing Threshold Pulse Width: 0.4 ms
Lead Channel Setting Pacing Amplitude: 2 V
Lead Channel Setting Pacing Pulse Width: 0.4 ms
Lead Channel Setting Sensing Sensitivity: 0.6 mV
Pulse Gen Serial Number: 126781

## 2020-11-29 NOTE — Progress Notes (Signed)
Remote ICD transmission.   

## 2020-12-30 ENCOUNTER — Other Ambulatory Visit: Payer: Self-pay | Admitting: Internal Medicine

## 2020-12-30 DIAGNOSIS — I5022 Chronic systolic (congestive) heart failure: Secondary | ICD-10-CM

## 2020-12-30 DIAGNOSIS — I429 Cardiomyopathy, unspecified: Secondary | ICD-10-CM

## 2021-01-10 ENCOUNTER — Encounter: Payer: 59 | Admitting: Internal Medicine

## 2021-01-17 ENCOUNTER — Encounter: Payer: Self-pay | Admitting: Internal Medicine

## 2021-01-17 ENCOUNTER — Other Ambulatory Visit: Payer: Self-pay

## 2021-01-17 ENCOUNTER — Ambulatory Visit (INDEPENDENT_AMBULATORY_CARE_PROVIDER_SITE_OTHER): Payer: 59 | Admitting: Internal Medicine

## 2021-01-17 VITALS — BP 120/80 | HR 66 | Ht 70.0 in | Wt 192.0 lb

## 2021-01-17 DIAGNOSIS — Z131 Encounter for screening for diabetes mellitus: Secondary | ICD-10-CM

## 2021-01-17 DIAGNOSIS — Z79899 Other long term (current) drug therapy: Secondary | ICD-10-CM | POA: Diagnosis not present

## 2021-01-17 DIAGNOSIS — I429 Cardiomyopathy, unspecified: Secondary | ICD-10-CM | POA: Diagnosis not present

## 2021-01-17 DIAGNOSIS — I472 Ventricular tachycardia, unspecified: Secondary | ICD-10-CM

## 2021-01-17 DIAGNOSIS — Z13220 Encounter for screening for lipoid disorders: Secondary | ICD-10-CM

## 2021-01-17 DIAGNOSIS — Z95810 Presence of automatic (implantable) cardiac defibrillator: Secondary | ICD-10-CM

## 2021-01-17 NOTE — Patient Instructions (Addendum)
Medication Instructions:  - Your physician recommends that you continue on your current medications as directed. Please refer to the Current Medication list given to you today.  *If you need a refill on your cardiac medications before your next appointment, please call your pharmacy*   Lab Work: - Your physician recommends that you have lab work today: BMP/ Lipid panel/ direct LDL/ HgbA1C  If you have labs (blood work) drawn today and your tests are completely normal, you will receive your results only by: [place] (if you have MyChart) OR A paper copy in the mail If you have any lab test that is abnormal or we need to change your treatment, we will call you to review the results.   Testing/Procedures: - none ordered   Follow-Up: At [facility], you and your health needs are our priority.  As part of our continuing mission to provide you with exceptional heart care, we have created designated Provider Care Teams.  These Care Teams include your primary Cardiologist (physician) and Advanced Practice Providers (APPs -  Physician Assistants and Nurse Practitioners) who all work together to provide you with the care you need, when you need it.  We recommend signing up for the patient portal called "MyChart".  Sign up information is provided on this After Visit Summary.  MyChart is used to connect with patients for Virtual Visits (Telemedicine).  Patients are able to view lab/test results, encounter notes, upcoming appointments, etc.  Non-urgent messages can be sent to your provider as well.   To learn more about what you can do with MyChart, go to [email or web address].    Your next appointment:   6 month(s)  The format for your next appointment:   In Person  Provider:   Virl Axe, MD   Other Instructions N/a

## 2021-01-17 NOTE — Progress Notes (Signed)
Patient Care Team: Delsa Grana, PA-C as PCP - General (Family Medicine) Sanda Dyron Kawano Satira Anis, MD as Attending Physician (Family Medicine) Deboraha Sprang, MD as Consulting Physician (Cardiology)   HPI  [name] is a 45 y.o. male Seen in followup for ventricular tachycardia occurring in the context of surgically corrected congenital heart disease With Aortic valve replacement.  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  My note from 2011 reports that at the time of surgery "he had damage to his right coronary artery and underwent repair." He is status post ICD implantation. Device generator replacement 12/14.  4/13 had appropriate shock for fast ventricular tachycardia He underwent   12/17  Admitted [facility] for syncopal VT- PM  Antecedent VT PM in wake of viral illness  Ranexa and MAG added He has had interval VTNS monomorphic and polymorphic  2/20 --abrupt onset of presyncope prompting him to sit on the floor and about 15 seconds the episode had resolved.  Device interrogation demonstrates rapid monomorphic VT with acceleration with ATP and then termination spontaneously at the moment of charging being completed.  The shock was diverted.    Intercurrent AFib-paroxysmal   Referred 2/21 to [facility] for consideration of VT ablation -briefly summarized as normal endocardial voltage.  Thought to be epicardial.  Mapping of the CS was complicated by dissection.  VTs were hemodynamically unstable and " Given the inability to activation map due to hemodynamically unstable VT and inability to access the epicardial space, the case was concluded at this time."  The patient denies chest pain, shortness of breath, nocturnal dyspnea, orthopnea or peripheral edema.  There have been no palpitations, lightheadedness or syncope.  Complains of having to go back to the office after  nearly two years remote     DATE TEST EF   3/13    Echo 40-45 % IP akinesis   1/15    TEE  60 % "mechanical aortic valve"  2/17 Echo 45-50%   12/17 Echo  35-40% "bioprosthesis "  5/17 Echo  30-35%   8/18 CT 30-35% RCA Total without flow//aneurysm Inferior Wall LAD & CX w FFR > 80%  9/21 Echo  35-40% Bioprosthesis -- AoV mean grad 33       Antiarrhythmics Date Reason stopped  Ranolazine 12/17     Date Cr K  12/18 1.24 4.3   11/19 1.14 3.8  9/20 1.17 4.5  9/21 1.25 4.8          DATE TEST    6/18    HOLTER <1 % PVCs              Past Medical History:  Diagnosis Date   Anxiety    Aortic valve replaced    Requiring replacement, specifics not available   Hearing loss of both ears 12/22/2015   Heart murmur    Hypertension    IBD (inflammatory bowel disease) 09/23/2015   ICD (implantable cardiac defibrillator), BSX single    Obesity 12/22/2015   Secondary cardiomyopathy ([place])    Syncope    Ventricular septal defect    Ventricular tachycardia ([place])    appropriate VT shock therapy /13    Past Surgical History:  Procedure Laterality Date   ANGIOPLASTY     RCA repair with vein angioplasty following injury with the aforementioned surgery   AORTIC VALVE REPLACEMENT     Bioprosthesis  CARDIAC DEFIBRILLATOR PLACEMENT     CARDIAC VALVE REPLACEMENT     CHOLECYSTECTOMY     COLONOSCOPY WITH PROPOFOL N/A 12/06/2015   Procedure: COLONOSCOPY WITH PROPOFOL;  Surgeon: [name], MD;  Location: [facility] ENDOSCOPY;  Service: Endoscopy;  Laterality: N/A;   CORONARY ARTERY BYPASS GRAFT     IMPLANTABLE CARDIOVERTER DEFIBRILLATOR GENERATOR CHANGE N/A 05/05/2013   Procedure: IMPLANTABLE CARDIOVERTER DEFIBRILLATOR GENERATOR CHANGE;  Surgeon: Deboraha Sprang, MD;  Location: [facility] CATH LAB;  Service: Cardiovascular;  Laterality: N/A;   INSERT / REPLACE / REMOVE PACEMAKER     VSD REPAIR      Current Outpatient Medications  Medication Sig Dispense Refill   carvedilol (COREG) 12.5 MG tablet  TAKE 1 TABLET TWICE A DAY 180 tablet 3   ENTRESTO 49-51 MG TAKE 1 TABLET TWICE A DAY 180 tablet 3   fluticasone (FLONASE) 50 MCG/ACT nasal spray Place 2 sprays into both nostrils daily. 16 g 2   loratadine (CLARITIN) 5 MG chewable tablet Chew 1 tablet (5 mg total) by mouth daily as needed for allergies. 30 tablet 0   magnesium oxide (MAG-OX) 400 (241.3 Mg) MG tablet Take 1 tablet (400 mg total) by mouth 2 (two) times daily. 60 tablet 0   meclizine (ANTIVERT) 25 MG tablet Take 0.5-1 tablets (12.5-25 mg total) by mouth 3 (three) times daily as needed for dizziness. 30 tablet 2   Multiple Vitamin (MULTIVITAMIN) tablet Take 1 tablet by mouth daily.     ranolazine (RANEXA) 1000 MG SR tablet TAKE 1 TABLET TWICE A DAY 180 tablet 3   sildenafil (REVATIO) 20 MG tablet Take 2-3 tablets (40-60 mg total) by mouth daily as needed. Never combine with nitrates 30 tablet 2   spironolactone (ALDACTONE) 25 MG tablet TAKE ONE-HALF (1/2) TABLET DAILY 45 tablet 3   triamcinolone cream (KENALOG) 0.1 % Apply 1 application topically 2 (two) times daily as needed. 30 g 0   umeclidinium bromide (INCRUSE ELLIPTA) 62.5 MCG/INH AEPB Inhale 1 puff into the lungs daily. 30 each 5   No current facility-administered medications for this visit.    Allergies  Allergen Reactions   Shellfish Allergy Nausea And Vomiting    Review of Systems negative except from HPI and PMH  Physical Exam BP 120/80 (BP Location: Left Arm, Patient Position: Sitting, Cuff Size: Normal)   Pulse 66   Ht '5\' 10"'$  (1.778 m)   Wt 192 lb (87.1 kg)   SpO2 96%   BMI 27.55 kg/m   Well developed and well nourished in no acute distress HENT normal Neck supple with JVP-flat Clear Device pocket well healed; without hematoma or erythema.  There is no tethering  Regular rate and rhythm, no   gallop 3/6 murmur Abd-soft with active BS No Clubbing cyanosis   edema Skin-warm and dry A & Oriented  Grossly normal sensory and motor function  ECG [name] is  Assessment and plan  Ventricular tachycardia-recurrent  Hypertension   Aortic valve-bioprosthesis   AS mod-severe//AI mod  ICD-[employer]    Cardiomyopathy-hypertrophic/ischemic-secondary  PVCs-infrequent  RCA No flow, presumed 2/2 surgery As above   No interval ventricular tachycardia.  We will continue to ranolazine 1000 mg twice a day and carvedilol 12.5 twice daily.  Cardiomyopathy without heart failure symptoms.  Continues Entresto 49/51 spironolactone 12.5.  Given the lightheadedness in the morning we will have him take his spironolactone at night.  I have also suggested that if the symptoms do not abate he could decrease his carvedilol from  12.5 twice daily--6.25/12.5.  Blood pressure is well controlled.  We will continue the carvedilol, Entresto and spironolactone.  Needs surveillance laboratories.  We will check a metabolic profile today.  Also check his lipid profile hemoglobin A1c

## 2021-01-18 LAB — BASIC METABOLIC PANEL
BUN/Creatinine Ratio: 10 (ref 9–20)
BUN: 11 mg/dL (ref 6–24)
CO2: 25 mmol/L (ref 20–29)
Calcium: 9.5 mg/dL (ref 8.7–10.2)
Chloride: 104 mmol/L (ref 96–106)
Creatinine, Ser: 1.1 mg/dL (ref 0.76–1.27)
Glucose: 77 mg/dL (ref 65–99)
Potassium: 4.4 mmol/L (ref 3.5–5.2)
Sodium: 140 mmol/L (ref 134–144)
eGFR: 84 mL/min/{1.73_m2} (ref >59)

## 2021-01-20 ENCOUNTER — Other Ambulatory Visit: Payer: Self-pay

## 2021-01-20 ENCOUNTER — Ambulatory Visit (HOSPITAL_COMMUNITY): Payer: 59 | Attending: Cardiology

## 2021-01-20 DIAGNOSIS — T8209XD Other mechanical complication of heart valve prosthesis, subsequent encounter: Secondary | ICD-10-CM | POA: Diagnosis not present

## 2021-01-20 DIAGNOSIS — T8209XA Other mechanical complication of heart valve prosthesis, initial encounter: Secondary | ICD-10-CM | POA: Diagnosis not present

## 2021-01-20 LAB — ECHOCARDIOGRAM COMPLETE
AR max vel: 0.75 cm2
AV Area VTI: 0.69 cm2
AV Area mean vel: 0.8 cm2
AV Mean grad: 39.3 mmHg
AV Peak grad: 61.9 mmHg
Ao pk vel: 3.93 m/s
Area-P 1/2: 3.87 cm2
P 1/2 time: 575 msec
S' Lateral: 5.1 cm

## 2021-02-09 ENCOUNTER — Ambulatory Visit (INDEPENDENT_AMBULATORY_CARE_PROVIDER_SITE_OTHER): Payer: 59

## 2021-02-09 DIAGNOSIS — I428 Other cardiomyopathies: Secondary | ICD-10-CM

## 2021-02-10 ENCOUNTER — Telehealth: Payer: Self-pay | Admitting: Cardiovascular Disease

## 2021-02-10 NOTE — Telephone Encounter (Signed)
Patient calling to find out whether it is okay for him to travel in his condition. He states he needs to know before Monday morning.

## 2021-02-10 NOTE — Telephone Encounter (Addendum)
Spoke with the pt and he plans to travel to [employer] for a week.Marland Kitchen after talking with Kathlene November PA... pt activity is at his discretion based on his symptoms prior to his appt with Dr. Burt Knack 02/21/21... he says he is feeling well and will monitor his symptoms and if anything changes while he is out of town he will seek medical help for immediate assessment or call our office for advice. Pt to be sure to travel with his medications in hand and not in a checked bag.   Will check with Dr. Johnsie Cancel the DOD since [facility] access to the pts chart was limited at the time of our conversation to be sure no other recommendations.   Addendum:   Spoke with the pt and per Dr. Johnsie Cancel he was advised not optimal to travel at this time but if he is going to go he should be cautious of his symptoms, possibly plan for a wheelchair at the airport so he is not inducing any unnecessary exertion.  He will be sure to know where the closest medical facility is if needed. Pt is flying on a Engineer, site. Pt verbalized understanding.   Pt to call if he has any further questions.

## 2021-02-11 LAB — CUP PACEART REMOTE DEVICE CHECK
Battery Remaining Longevity: 72 mo
Battery Remaining Percentage: 74 %
Brady Statistic RV Percent Paced: 0 %
Date Time Interrogation Session: 20220922030100
HighPow Impedance: 55 Ohm
Implantable Lead Implant Date: 20080214
Implantable Lead Location: 753860
Implantable Lead Model: 185
Implantable Lead Serial Number: 178017
Implantable Pulse Generator Implant Date: 20141216
Lead Channel Impedance Value: 664 Ohm
Lead Channel Pacing Threshold Amplitude: 0.8 V
Lead Channel Pacing Threshold Pulse Width: 0.4 ms
Lead Channel Setting Pacing Amplitude: 2 V
Lead Channel Setting Pacing Pulse Width: 0.4 ms
Lead Channel Setting Sensing Sensitivity: 0.6 mV
Pulse Gen Serial Number: 126781

## 2021-02-12 NOTE — Telephone Encounter (Signed)
Thx for letting me know 

## 2021-02-16 NOTE — Progress Notes (Signed)
Remote ICD transmission.   

## 2021-02-24 ENCOUNTER — Other Ambulatory Visit: Payer: Self-pay

## 2021-02-24 ENCOUNTER — Ambulatory Visit: Payer: 59 | Admitting: Cardiovascular Disease

## 2021-02-24 ENCOUNTER — Encounter: Payer: Self-pay | Admitting: Cardiovascular Disease

## 2021-02-24 VITALS — BP 118/76 | HR 73 | Ht 70.0 in | Wt 194.6 lb

## 2021-02-24 DIAGNOSIS — I359 Nonrheumatic aortic valve disorder, unspecified: Secondary | ICD-10-CM

## 2021-02-24 DIAGNOSIS — T8209XA Other mechanical complication of heart valve prosthesis, initial encounter: Secondary | ICD-10-CM

## 2021-02-24 DIAGNOSIS — T8209XD Other mechanical complication of heart valve prosthesis, subsequent encounter: Secondary | ICD-10-CM | POA: Diagnosis not present

## 2021-02-24 NOTE — H&P (View-Only) (Signed)
Cardiology Office Note:    Date:  03/01/2021   ID:  [name], DOB [birth year], MRN [id number]  PCP:  Delsa Grana, PA-C   [place] [facility] Cardiologist:  Sherren Mocha, MD Electrophysiologist:  Virl Axe, MD     Referring MD: Delsa Grana, PA-C   No chief complaint on file.   History of Present Illness:    [name] is a 45 y.o. male presenting for follow-up of valvular heart disease.  As a teen he was found to have a VSD on a sports participation physical. He underwent surgical VSD repair at 44 years old ([place] ). He was then found to have an aortic valve problem at age 26 and underwent bioprosthetic aortic valve replacement ([place]). During that surgery there was an injury to the RCA and he had an infarct at that time. He was found to have episodes of VT in 2008 and was treated with medical therapy and an ICD. He has had a few appropriate shocks for VT over the years and has had ATP therapies delivered as well. He was evaluated for VT ablation at [facility] but he was found to have an epicardial source and did not proceed with ablation.   The patient's echocardiogram studies are reviewed over time.  He has had very stable LV dysfunction with an LVEF consistently estimated in the 35 to 40% range.  In 2020, the patient's mean transaortic gradient was 43 mmHg with a dimensionless index of 0.27.  In 2021 his mean gradient was 33 mmHg with a dimensionless index of 0.31.  The patient's recent echo shows a mean transvalvular gradient of 42 mmHg, peak transaortic velocity of 3.9 m/s, and dimensionless index of 0.12.  On my personal review of the echo.  The LVOT Doppler is measured to high and the dimensionless index is therefore not accurate.  The patient is here with his wife today.  He has some generalized fatigue but otherwise no specific symptoms.  He denies chest pain, shortness of breath, edema, heart palpitations, orthopnea, or PND.  Past  Medical History:  Diagnosis Date   Anxiety    Aortic valve replaced    Requiring replacement, specifics not available   Hearing loss of both ears 12/22/2015   Heart murmur    Hypertension    IBD (inflammatory bowel disease) 09/23/2015   ICD (implantable cardiac defibrillator), BSX single    Obesity 12/22/2015   Secondary cardiomyopathy ([place])    Syncope    Ventricular septal defect    Ventricular tachycardia    appropriate VT shock therapy /13    Past Surgical History:  Procedure Laterality Date   ANGIOPLASTY     RCA repair with vein angioplasty following injury with the aforementioned surgery   AORTIC VALVE REPLACEMENT     Bioprosthesis   CARDIAC DEFIBRILLATOR PLACEMENT     CARDIAC VALVE REPLACEMENT     CHOLECYSTECTOMY     COLONOSCOPY WITH PROPOFOL N/A 12/06/2015   Procedure: COLONOSCOPY WITH PROPOFOL;  Surgeon: [name], MD;  Location: [facility] ENDOSCOPY;  Service: Endoscopy;  Laterality: N/A;   CORONARY ARTERY BYPASS GRAFT     IMPLANTABLE CARDIOVERTER DEFIBRILLATOR GENERATOR CHANGE N/A 05/05/2013   Procedure: IMPLANTABLE CARDIOVERTER DEFIBRILLATOR GENERATOR CHANGE;  Surgeon: Deboraha Sprang, MD;  [facility] C[facility] LAB;  Service: Cardiovascular;  Laterality: N/A;   INSERT / REPLACE / REMOVE PACEMAKER     VSD REPAIR      Current Medications: Current Meds  Medication Sig  carvedilol (COREG) 12.5 MG tablet TAKE 1 TABLET TWICE A DAY   ENTRESTO 49-51 MG TAKE 1 TABLET TWICE A DAY   fluticasone (FLONASE) 50 MCG/ACT nasal spray Place 2 sprays into both nostrils daily. (Patient taking differently: Place 2 sprays into both nostrils daily as needed for allergies.)   loratadine (CLARITIN) 5 MG chewable tablet Chew 1 tablet (5 mg total) by mouth daily as needed for allergies.   magnesium oxide (MAG-OX) 400 (241.3 Mg) MG tablet Take 1 tablet (400 mg total) by mouth 2 (two) times daily.   meclizine (ANTIVERT) 25 MG tablet Take 0.5-1 tablets (12.5-25 mg total) by mouth 3 (three) times daily as  needed for dizziness.   Multiple Vitamin (MULTIVITAMIN) tablet Take 2 tablets by mouth daily.   ranolazine (RANEXA) 1000 MG SR tablet TAKE 1 TABLET TWICE A DAY   spironolactone (ALDACTONE) 25 MG tablet TAKE ONE-HALF (1/2) TABLET DAILY   triamcinolone cream (KENALOG) 0.1 % Apply 1 application topically 2 (two) times daily as needed.   umeclidinium bromide (INCRUSE ELLIPTA) 62.5 MCG/INH AEPB Inhale 1 puff into the lungs daily. (Patient taking differently: Inhale 1 puff into the lungs daily as needed (shortness of breath).)   [DISCONTINUED] sildenafil (REVATIO) 20 MG tablet Take 2-3 tablets (40-60 mg total) by mouth daily as needed. Never combine with nitrates     Allergies:   Shellfish allergy   Social History   Socioeconomic History   Marital status: Married    Spouse name: Anderson Malta   Number of children: 2   Years of education: 17   Highest education level: Bachelor's degree (e.g., BA, AB, BS)  Occupational History   Occupation: Full time     Employer: [employer]    Comment: In IT dept at [employer]  Tobacco Use   Smoking status: Never   Smokeless tobacco: Never  Vaping Use   Vaping Use: Never used  Substance and Sexual Activity   Alcohol use: No   Drug use: No   Sexual activity: Yes    Partners: Female  Other Topics Concern   Not on file  Social History Narrative   Married   Social Determinants of Health   Financial Resource Strain: Not on file  Food Insecurity: Not on file  Transportation Needs: Not on file  Physical Activity: Not on file  Stress: Not on file  Social Connections: Not on file     Family History: The patient's family history includes Cancer in his maternal grandfather and maternal grandmother; Heart disease in his father; Hypertension in his maternal grandfather and maternal grandmother.  ROS:   Please see the history of present illness.    All other systems reviewed and are negative.  EKGs/Labs/Other Studies Reviewed:    The following studies were  reviewed today: Echo 01/20/2021: 1. Bioprosthetic aortic valve degenerative stenosis.   2. Left ventricular ejection fraction, by estimation, is 35 to 40%. The  left ventricle has moderately decreased function. The left ventricle  demonstrates global hypokinesis. The left ventricular internal cavity size  was mildly dilated. There is mild  left ventricular hypertrophy. Left ventricular diastolic parameters are  indeterminate.   3. Right ventricular systolic function is normal. The right ventricular  size is normal.   4. The mitral valve is normal in structure. No evidence of mitral valve  regurgitation. No evidence of mitral stenosis.   5. The aortic valve has been repaired/replaced. Aortic valve  regurgitation is mild. Severe aortic valve stenosis. There is a  bioprosthetic valve present in  the aortic position. Procedure Date: 2005.  Aortic valve area, by VTI measures 0.69 cm. Aortic  valve mean gradient measures 39.3 mmHg. Aortic valve Vmax measures 3.93  m/s.   6. The inferior vena cava is normal in size with greater than 50%  respiratory variability, suggesting right atrial pressure of 3 mmHg.   Comparison(s): Prior aortic valve mean gradient was 33 mmHg, with Vmax of  3.88 m/s.   FINDINGS   Left Ventricle: Left ventricular ejection fraction, by estimation, is 35  to 40%. The left ventricle has moderately decreased function. The left  ventricle demonstrates global hypokinesis. The left ventricular internal  cavity size was mildly dilated.  There is mild left ventricular hypertrophy. Left ventricular diastolic  parameters are indeterminate.      LV Wall Scoring:  The inferior wall is akinetic.   Right Ventricle: The right ventricular size is normal. No increase in  right ventricular wall thickness. Right ventricular systolic function is  normal.   Left Atrium: Left atrial size was normal in size.   Right Atrium: Right atrial size was normal in size.   Pericardium: There  is no evidence of pericardial effusion.   Mitral Valve: The mitral valve is normal in structure. No evidence of  mitral valve regurgitation. No evidence of mitral valve stenosis.   Tricuspid Valve: The tricuspid valve is normal in structure. Tricuspid  valve regurgitation is not demonstrated. No evidence of tricuspid  stenosis.   Aortic Valve: The aortic valve has been repaired/replaced. Aortic valve  regurgitation is mild. Aortic regurgitation PHT measures 575 msec. Severe  aortic stenosis is present. Aortic valve mean gradient measures 39.3 mmHg.  Aortic valve peak gradient  measures 61.9 mmHg. Aortic valve area, by VTI measures 0.69 cm. There is  a bioprosthetic valve present in the aortic position. Procedure Date:  2005.   Pulmonic Valve: The pulmonic valve was normal in structure. Pulmonic valve  regurgitation is not visualized. No evidence of pulmonic stenosis.   Aorta: The aortic root is normal in size and structure.   Venous: The inferior vena cava is normal in size with greater than 50%  respiratory variability, suggesting right atrial pressure of 3 mmHg.   IAS/Shunts: No atrial level shunt detected by color flow Doppler.   Additional Comments: Bioprosthetic aortic valve degenerative stenosis. A  device lead is visualized.   LEFT VENTRICLE  PLAX 2D  LVIDd:         6.20 cm  Diastology  LVIDs:         5.10 cm  LV e' medial:    6.74 cm/s  LV PW:         1.00 cm  LV E/e' medial:  9.8  LV IVS:        1.30 cm  LV e' lateral:   8.59 cm/s  LVOT diam:     2.90 cm  LV E/e' lateral: 7.7  LV SV:         73  LV SV Index:   36  LVOT Area:     6.61 cm      RIGHT VENTRICLE            IVC  RV S prime:     8.38 cm/s  IVC diam: 1.10 cm  TAPSE (M-mode): 1.4 cm   LEFT ATRIUM           Index       RIGHT ATRIUM           Index  LA  diam:      4.10 cm 2.00 cm/m  RA Pressure: 3.00 mmHg  LA Vol (A2C): 74.0 ml 36.07 ml/m RA Area:     15.30 cm                                    RA  Volume:   41.50 ml  20.23 ml/m   AORTIC VALVE  AV Area (Vmax):    0.75 cm  AV Area (Vmean):   0.80 cm  AV Area (VTI):     0.69 cm  AV Vmax:           393.33 cm/s  AV Vmean:          297.667 cm/s  AV VTI:            1.061 m  AV Peak Grad:      61.9 mmHg  AV Mean Grad:      39.3 mmHg  LVOT Vmax:         44.80 cm/s  LVOT Vmean:        36.000 cm/s  LVOT VTI:          0.111 m  LVOT/AV VTI ratio: 0.10  AI PHT:            575 msec     AORTA  Ao Root diam: 3.70 cm  Ao Asc diam:  3.10 cm   MITRAL VALVE               TRICUSPID VALVE  MV Area (PHT): 3.87 cm    Estimated RAP:  3.00 mmHg  MV Decel Time: 196 msec  MV E velocity: 66.00 cm/s  SHUNTS  MV A velocity: 53.60 cm/s  Systemic VTI:  0.11 m  MV E/A ratio:  1.23        Systemic Diam: 2.90 cm  EKG:  EKG is ordered today.  The ekg ordered today demonstrates normal sinus rhythm 73 bpm, nonspecific IVCD, no significant change from previous studies.  Recent Labs: 02/24/2021: BUN 11; Creatinine, Ser 1.26; Hemoglobin 14.5; Platelets 164; Potassium 4.9; Sodium 140  Recent Lipid Panel    Component Value Date/Time   CHOL 156 07/12/2020 1135   TRIG 110 07/12/2020 1135   HDL 38 (L) 07/12/2020 1135   CHOLHDL 4.1 07/12/2020 1135   CHOLHDL 4.5 02/17/2020 1130   LDLCALC 98 07/12/2020 1135   LDLCALC 106 (H) 02/17/2020 1130     Risk Assessment/Calculations:           Physical Exam:    VS:  BP 118/76   Pulse 73   Ht 5\' 10"  (1.778 m)   Wt 194 lb 9.6 oz (88.3 kg)   SpO2 97%   BMI 27.92 kg/m     Wt Readings from Last 3 Encounters:  02/24/21 194 lb 9.6 oz (88.3 kg)  01/17/21 192 lb (87.1 kg)  08/24/20 209 lb 3.2 oz (94.9 kg)     GEN:  Well nourished, well developed in no acute distress HEENT: Normal NECK: No JVD; No carotid bruits LYMPHATICS: No lymphadenopathy CARDIAC: RRR, 3/6 systolic murmur at the RUSB RESPIRATORY:  Clear to auscultation without rales, wheezing or rhonchi  ABDOMEN: Soft, non-tender,  non-distended MUSCULOSKELETAL:  No edema; No deformity  SKIN: Warm and dry NEUROLOGIC:  Alert and oriented x 3 PSYCHIATRIC:  Normal affect   ASSESSMENT:    1. Aortic valve disorder   2. Prosthetic valve dysfunction, initial  encounter    PLAN:    In order of problems listed above:  The patient has severe prosthetic valve stenosis involving his aortic valve bioprosthesis which was implanted 23 years ago.  I have reviewed multiple echo studies over time which demonstrates stable LV dysfunction with LVEF 35 to 40% over the last 3 years.  Prior to that, serial echo studies showed variable degrees of LV systolic dysfunction with LVEF ranging from 30% to 50%.  The patient's mean transaortic gradients averaged around 24 mmHg on echo studies from 2017-2019.  However, his 2020 echo showed an increased mean transvalvular gradient up to 44 mmHg.  Since that time he has had stable but severe prosthetic valve stenosis.  We discussed potential treatment options at length today.  The patient's past history is complicated by 2 prior cardiac surgeries with a VSD repair at age 49 and prior aortic valve replacement at age 34.  We discussed we do conventional surgery as well as transcatheter valve therapies today.  Considering his young age, if he is felt to be a candidate for surgery, mechanical aortic valve replacement might be a reasonable consideration.  He will need to undergo further evaluation with cardiac catheterization to define coronary anatomy and hemodynamics. I have reviewed the risks, indications, and alternatives to cardiac catheterization, possible angioplasty, and stenting with the patient. Risks include but are not limited to bleeding, infection, vascular injury, stroke, myocardial infection, arrhythmia, kidney injury, radiation-related injury in the case of prolonged fluoroscopy use, emergency cardiac surgery, and death. The patient understands the risks of serious complication is 1-2 in 2836 with  diagnostic cardiac cath and 1-2% or less with angioplasty/stenting.  Once his cardiac catheterization is completed, he will be referred for formal cardiac surgical evaluation.   Shared Decision Making/Informed Consent The risks [stroke (1 in 1000), death (1 in 1000), kidney failure [usually temporary] (1 in 500), bleeding (1 in 200), allergic reaction [possibly serious] (1 in 200)], benefits (diagnostic support and management of coronary artery disease) and alternatives of a cardiac catheterization were discussed in detail with Mr. Clauson and he is willing to proceed.    Medication Adjustments/Labs and Tests Ordered: Current medicines are reviewed at length with the patient today.  Concerns regarding medicines are outlined above.  Orders Placed This Encounter  Procedures   Basic metabolic panel   CBC   EKG 12-Lead   No orders of the defined types were placed in this encounter.   Patient Instructions  Medication Instructions:  Your physician recommends that you continue on your current medications as directed. Please refer to the Current Medication list given to you today.  *If you need a refill on your cardiac medications before your next appointment, please call your pharmacy*   Lab Work: BMET and CBC today  If you have labs (blood work) drawn today and your tests are completely normal, you will receive your results only by: [place] (if you have MyChart) OR A paper copy in the mail If you have any lab test that is abnormal or we need to change your treatment, we will call you to review the results.   Testing/Procedures: Your physician has requested that you have a cardiac catheterization. Cardiac catheterization is used to diagnose and/or treat various heart conditions. Doctors may recommend this procedure for a number of different reasons. The most common reason is to evaluate chest pain. Chest pain can be a symptom of coronary artery disease (CAD), and cardiac  catheterization can show whether plaque is narrowing  or blocking your heart's arteries. This procedure is also used to evaluate the valves, as well as measure the blood flow and oxygen levels in different parts of your heart. For further information please visit [facility]uge[facility]iesta[facility][email or web address]. Please follow instruction sheet, as given.    Follow-Up:  Follow up will be based on next steps for valve work up.  Other Instructions   [place] OFFICE [place], [place] [address] Dept: [phone] Loc: [phone]  MICKIE KOZIKOWSKI  02/24/2021  You are scheduled for a Cardiac Catheterization on Wednesday, October 12 with Dr. Sherren Mocha.  1. Please arrive at the [facility] (Main Entrance A) at [facility]: [address] [place], [place] [address] at 6:30 AM (This time is two hours before your procedure to ensure your preparation). Free valet parking service is available.   Special note: Every effort is made to have your procedure done on time. Please understand that emergencies sometimes delay scheduled procedures.  2. Diet: Do not eat solid foods after midnight.  The patient may have clear liquids until 5am upon the day of the procedure.  3. Labs: You will have labs drawn today. You do not need to be fasting.  4. Medication instructions in preparation for your procedure:   Contrast Allergy: No  You will need to hold your Spironolactone the morning of your procedure.  On the morning of your procedure, take your Aspirin and any morning medicines NOT listed above.  You may use sips of water.  5. Plan for one night stay--bring personal belongings. 6. Bring a current list of your medications and current insurance cards. 7. You MUST have a responsible person to drive you home. 8. Someone MUST be with you the first 24 hours after you arrive home or your discharge will be delayed. 9. Please  wear clothes that are easy to get on and off and wear slip-on shoes.  Thank you for allowing [place] to care for you!   -- [facility] Invasive Cardiovascular services     Signed, Sherren Mocha, MD  03/01/2021 5:11 AM    [place]

## 2021-02-24 NOTE — Patient Instructions (Signed)
Medication Instructions:  Your physician recommends that you continue on your current medications as directed. Please refer to the Current Medication list given to you today.  *If you need a refill on your cardiac medications before your next appointment, please call your pharmacy*   Lab Work: BMET and CBC today  If you have labs (blood work) drawn today and your tests are completely normal, you will receive your results only by: [place] (if you have MyChart) OR A paper copy in the mail If you have any lab test that is abnormal or we need to change your treatment, we will call you to review the results.   Testing/Procedures: Your physician has requested that you have a cardiac catheterization. Cardiac catheterization is used to diagnose and/or treat various heart conditions. Doctors may recommend this procedure for a number of different reasons. The most common reason is to evaluate chest pain. Chest pain can be a symptom of coronary artery disease (CAD), and cardiac catheterization can show whether plaque is narrowing or blocking your heart's arteries. This procedure is also used to evaluate the valves, as well as measure the blood flow and oxygen levels in different parts of your heart. For further information please visit [facility]uge[facility]iest[facility][email or web address]. Please follow instruction sheet, as given.    Follow-Up:  Follow up will be based on next steps for valve work up.  Other Instructions   [place] OFFICE [place], [place] [address] Dept: [phone] Loc: [phone]  [name]  02/24/2021  You are scheduled for a Cardiac Catheterization on Wednesday, October 12 with Dr. Sherren Mocha.  1. Please arrive at the [facility] (Main Entrance A) at [facility]: [address] [place], [place] [address] at 6:30 AM (This time is two hours before your procedure to ensure your  preparation). Free valet parking service is available.   Special note: Every effort is made to have your procedure done on time. Please understand that emergencies sometimes delay scheduled procedures.  2. Diet: Do not eat solid foods after midnight.  The patient may have clear liquids until 5am upon the day of the procedure.  3. Labs: You will have labs drawn today. You do not need to be fasting.  4. Medication instructions in preparation for your procedure:   Contrast Allergy: No  You will need to hold your Spironolactone the morning of your procedure.  On the morning of your procedure, take your Aspirin and any morning medicines NOT listed above.  You may use sips of water.  5. Plan for one night stay--bring personal belongings. 6. Bring a current list of your medications and current insurance cards. 7. You MUST have a responsible person to drive you home. 8. Someone MUST be with you the first 24 hours after you arrive home or your discharge will be delayed. 9. Please wear clothes that are easy to get on and off and wear slip-on shoes.  Thank you for allowing [place] to care for you!   -- [place] Invasive Cardiovascular services

## 2021-02-24 NOTE — Progress Notes (Signed)
Cardiology Office Note:    Date:  03/01/2021   ID:  [name], DOB [birth year], MRN [id number]  PCP:  Delsa Grana, PA-C   [place] [facility] Cardiologist:  Sherren Mocha, MD Electrophysiologist:  Virl Axe, MD     Referring MD: Delsa Grana, PA-C   No chief complaint on file.   History of Present Illness:    [name] is a 45 y.o. male presenting for follow-up of valvular heart disease.  As a teen he was found to have a VSD on a sports participation physical. He underwent surgical VSD repair at 45 years old ([place]). He was then found to have an aortic valve problem at age 65 and underwent bioprosthetic aortic valve replacement ([place]). During that surgery there was an injury to the RCA and he had an infarct at that time. He was found to have episodes of VT in 2008 and was treated with medical therapy and an ICD. He has had a few appropriate shocks for VT over the years and has had ATP therapies delivered as well. He was evaluated for VT ablation at [facility] but he was found to have an epicardial source and did not proceed with ablation.   The patient's echocardiogram studies are reviewed over time.  He has had very stable LV dysfunction with an LVEF consistently estimated in the 35 to 40% range.  In 2020, the patient's mean transaortic gradient was 43 mmHg with a dimensionless index of 0.27.  In 2021 his mean gradient was 33 mmHg with a dimensionless index of 0.31.  The patient's recent echo shows a mean transvalvular gradient of 42 mmHg, peak transaortic velocity of 3.9 m/s, and dimensionless index of 0.12.  On my personal review of the echo.  The LVOT Doppler is measured to high and the dimensionless index is therefore not accurate.  The patient is here with his wife today.  He has some generalized fatigue but otherwise no specific symptoms.  He denies chest pain, shortness of breath, edema, heart palpitations, orthopnea, or PND.  Past  Medical History:  Diagnosis Date   Anxiety    Aortic valve replaced    Requiring replacement, specifics not available   Hearing loss of both ears 12/22/2015   Heart murmur    Hypertension    IBD (inflammatory bowel disease) 09/23/2015   ICD (implantable cardiac defibrillator), BSX single    Obesity 12/22/2015   Secondary cardiomyopathy ([place])    Syncope    Ventricular septal defect    Ventricular tachycardia    appropriate VT shock therapy /13    Past Surgical History:  Procedure Laterality Date   ANGIOPLASTY     RCA repair with vein angioplasty following injury with the aforementioned surgery   AORTIC VALVE REPLACEMENT     Bioprosthesis   CARDIAC DEFIBRILLATOR PLACEMENT     CARDIAC VALVE REPLACEMENT     CHOLECYSTECTOMY     COLONOSCOPY WITH PROPOFOL N/A 12/06/2015   Procedure: COLONOSCOPY WITH PROPOFOL;  Surgeon: [name], MD;  Location: [facility] ENDOSCOPY;  Service: Endoscopy;  Laterality: N/A;   CORONARY ARTERY BYPASS GRAFT     IMPLANTABLE CARDIOVERTER DEFIBRILLATOR GENERATOR CHANGE N/A 05/05/2013   Procedure: IMPLANTABLE CARDIOVERTER DEFIBRILLATOR GENERATOR CHANGE;  Surgeon: Deboraha Sprang, MD;  Location: [facility] CATH LAB;  Service: Cardiovascular;  Laterality: N/A;   INSERT / REPLACE / REMOVE PACEMAKER     VSD REPAIR      Current Medications: Current Meds  Medication Sig  carvedilol (COREG) 12.5 MG tablet TAKE 1 TABLET TWICE A DAY   ENTRESTO 49-51 MG TAKE 1 TABLET TWICE A DAY   fluticasone (FLONASE) 50 MCG/ACT nasal spray Place 2 sprays into both nostrils daily. (Patient taking differently: Place 2 sprays into both nostrils daily as needed for allergies.)   loratadine (CLARITIN) 5 MG chewable tablet Chew 1 tablet (5 mg total) by mouth daily as needed for allergies.   magnesium oxide (MAG-OX) 400 (241.3 Mg) MG tablet Take 1 tablet (400 mg total) by mouth 2 (two) times daily.   meclizine (ANTIVERT) 25 MG tablet Take 0.5-1 tablets (12.5-25 mg total) by mouth 3 (three) times daily as  needed for dizziness.   Multiple Vitamin (MULTIVITAMIN) tablet Take 2 tablets by mouth daily.   ranolazine (RANEXA) 1000 MG SR tablet TAKE 1 TABLET TWICE A DAY   spironolactone (ALDACTONE) 25 MG tablet TAKE ONE-HALF (1/2) TABLET DAILY   triamcinolone cream (KENALOG) 0.1 % Apply 1 application topically 2 (two) times daily as needed.   umeclidinium bromide (INCRUSE ELLIPTA) 62.5 MCG/INH AEPB Inhale 1 puff into the lungs daily. (Patient taking differently: Inhale 1 puff into the lungs daily as needed (shortness of breath).)   [DISCONTINUED] sildenafil (REVATIO) 20 MG tablet Take 2-3 tablets (40-60 mg total) by mouth daily as needed. Never combine with nitrates     Allergies:   Shellfish allergy   Social History   Socioeconomic History   Marital status: Married    Spouse name: Anderson Malta   Number of children: 2   Years of education: 17   Highest education level: Bachelor's degree (e.g., BA, AB, BS)  Occupational History   Occupation: Full time     Employer: [employer]    Comment: In IT dept at [employer]  Tobacco Use   Smoking status: Never   Smokeless tobacco: Never  Vaping Use   Vaping Use: Never used  Substance and Sexual Activity   Alcohol use: No   Drug use: No   Sexual activity: Yes    Partners: Female  Other Topics Concern   Not on file  Social History Narrative   Married   Social Determinants of Health   Financial Resource Strain: Not on file  Food Insecurity: Not on file  Transportation Needs: Not on file  Physical Activity: Not on file  Stress: Not on file  Social Connections: Not on file     Family History: The patient's family history includes Cancer in his maternal grandfather and maternal grandmother; Heart disease in his father; Hypertension in his maternal grandfather and maternal grandmother.  ROS:   Please see the history of present illness.    All other systems reviewed and are negative.  EKGs/Labs/Other Studies Reviewed:    The following studies were  reviewed today: Echo 01/20/2021: 1. Bioprosthetic aortic valve degenerative stenosis.   2. Left ventricular ejection fraction, by estimation, is 35 to 40%. The  left ventricle has moderately decreased function. The left ventricle  demonstrates global hypokinesis. The left ventricular internal cavity size  was mildly dilated. There is mild  left ventricular hypertrophy. Left ventricular diastolic parameters are  indeterminate.   3. Right ventricular systolic function is normal. The right ventricular  size is normal.   4. The mitral valve is normal in structure. No evidence of mitral valve  regurgitation. No evidence of mitral stenosis.   5. The aortic valve has been repaired/replaced. Aortic valve  regurgitation is mild. Severe aortic valve stenosis. There is a  bioprosthetic valve present in  the aortic position. Procedure Date: 2005.  Aortic valve area, by VTI measures 0.69 cm. Aortic  valve mean gradient measures 39.3 mmHg. Aortic valve Vmax measures 3.93  m/s.   6. The inferior vena cava is normal in size with greater than 50%  respiratory variability, suggesting right atrial pressure of 3 mmHg.   Comparison(s): Prior aortic valve mean gradient was 33 mmHg, with Vmax of  3.88 m/s.   FINDINGS   Left Ventricle: Left ventricular ejection fraction, by estimation, is 35  to 40%. The left ventricle has moderately decreased function. The left  ventricle demonstrates global hypokinesis. The left ventricular internal  cavity size was mildly dilated.  There is mild left ventricular hypertrophy. Left ventricular diastolic  parameters are indeterminate.      LV Wall Scoring:  The inferior wall is akinetic.   Right Ventricle: The right ventricular size is normal. No increase in  right ventricular wall thickness. Right ventricular systolic function is  normal.   Left Atrium: Left atrial size was normal in size.   Right Atrium: Right atrial size was normal in size.   Pericardium: There  is no evidence of pericardial effusion.   Mitral Valve: The mitral valve is normal in structure. No evidence of  mitral valve regurgitation. No evidence of mitral valve stenosis.   Tricuspid Valve: The tricuspid valve is normal in structure. Tricuspid  valve regurgitation is not demonstrated. No evidence of tricuspid  stenosis.   Aortic Valve: The aortic valve has been repaired/replaced. Aortic valve  regurgitation is mild. Aortic regurgitation PHT measures 575 msec. Severe  aortic stenosis is present. Aortic valve mean gradient measures 39.3 mmHg.  Aortic valve peak gradient  measures 61.9 mmHg. Aortic valve area, by VTI measures 0.69 cm. There is  a bioprosthetic valve present in the aortic position. Procedure Date:  2005.   Pulmonic Valve: The pulmonic valve was normal in structure. Pulmonic valve  regurgitation is not visualized. No evidence of pulmonic stenosis.   Aorta: The aortic root is normal in size and structure.   Venous: The inferior vena cava is normal in size with greater than 50%  respiratory variability, suggesting right atrial pressure of 3 mmHg.   IAS/Shunts: No atrial level shunt detected by color flow Doppler.   Additional Comments: Bioprosthetic aortic valve degenerative stenosis. A  device lead is visualized.   LEFT VENTRICLE  PLAX 2D  LVIDd:         6.20 cm  Diastology  LVIDs:         5.10 cm  LV e' medial:    6.74 cm/s  LV PW:         1.00 cm  LV E/e' medial:  9.8  LV IVS:        1.30 cm  LV e' lateral:   8.59 cm/s  LVOT diam:     2.90 cm  LV E/e' lateral: 7.7  LV SV:         73  LV SV Index:   36  LVOT Area:     6.61 cm      RIGHT VENTRICLE            IVC  RV S prime:     8.38 cm/s  IVC diam: 1.10 cm  TAPSE (M-mode): 1.4 cm   LEFT ATRIUM           Index       RIGHT ATRIUM           Index  LA  diam:      4.10 cm 2.00 cm/m  RA Pressure: 3.00 mmHg  LA Vol (A2C): 74.0 ml 36.07 ml/m RA Area:     15.30 cm                                    RA  Volume:   41.50 ml  20.23 ml/m   AORTIC VALVE  AV Area (Vmax):    0.75 cm  AV Area (Vmean):   0.80 cm  AV Area (VTI):     0.69 cm  AV Vmax:           393.33 cm/s  AV Vmean:          297.667 cm/s  AV VTI:            1.061 m  AV Peak Grad:      61.9 mmHg  AV Mean Grad:      39.3 mmHg  LVOT Vmax:         44.80 cm/s  LVOT Vmean:        36.000 cm/s  LVOT VTI:          0.111 m  LVOT/AV VTI ratio: 0.10  AI PHT:            575 msec     AORTA  Ao Root diam: 3.70 cm  Ao Asc diam:  3.10 cm   MITRAL VALVE               TRICUSPID VALVE  MV Area (PHT): 3.87 cm    Estimated RAP:  3.00 mmHg  MV Decel Time: 196 msec  MV E velocity: 66.00 cm/s  SHUNTS  MV A velocity: 53.60 cm/s  Systemic VTI:  0.11 m  MV E/A ratio:  1.23        Systemic Diam: 2.90 cm  EKG:  EKG is ordered today.  The ekg ordered today demonstrates normal sinus rhythm 73 bpm, nonspecific IVCD, no significant change from previous studies.  Recent Labs: 02/24/2021: BUN 11; Creatinine, Ser 1.26; Hemoglobin 14.5; Platelets 164; Potassium 4.9; Sodium 140  Recent Lipid Panel    Component Value Date/Time   CHOL 156 07/12/2020 1135   TRIG 110 07/12/2020 1135   HDL 38 (L) 07/12/2020 1135   CHOLHDL 4.1 07/12/2020 1135   CHOLHDL 4.5 02/17/2020 1130   LDLCALC 98 07/12/2020 1135   LDLCALC 106 (H) 02/17/2020 1130     Risk Assessment/Calculations:           Physical Exam:    VS:  BP 118/76   Pulse 73   Ht 5\' 10"  (1.778 m)   Wt 194 lb 9.6 oz (88.3 kg)   SpO2 97%   BMI 27.92 kg/m     Wt Readings from Last 3 Encounters:  02/24/21 194 lb 9.6 oz (88.3 kg)  01/17/21 192 lb (87.1 kg)  08/24/20 209 lb 3.2 oz (94.9 kg)     GEN:  Well nourished, well developed in no acute distress HEENT: Normal NECK: No JVD; No carotid bruits LYMPHATICS: No lymphadenopathy CARDIAC: RRR, 3/6 systolic murmur at the RUSB RESPIRATORY:  Clear to auscultation without rales, wheezing or rhonchi  ABDOMEN: Soft, non-tender,  non-distended MUSCULOSKELETAL:  No edema; No deformity  SKIN: Warm and dry NEUROLOGIC:  Alert and oriented x 3 PSYCHIATRIC:  Normal affect   ASSESSMENT:    1. Aortic valve disorder   2. Prosthetic valve dysfunction, initial  encounter    PLAN:    In order of problems listed above:  The patient has severe prosthetic valve stenosis involving his aortic valve bioprosthesis which was implanted 23 years ago.  I have reviewed multiple echo studies over time which demonstrates stable LV dysfunction with LVEF 35 to 40% over the last 3 years.  Prior to that, serial echo studies showed variable degrees of LV systolic dysfunction with LVEF ranging from 30% to 50%.  The patient's mean transaortic gradients averaged around 24 mmHg on echo studies from 2017-2019.  However, his 2020 echo showed an increased mean transvalvular gradient up to 44 mmHg.  Since that time he has had stable but severe prosthetic valve stenosis.  We discussed potential treatment options at length today.  The patient's past history is complicated by 2 prior cardiac surgeries with a VSD repair at age 2 and prior aortic valve replacement at age 38.  We discussed we do conventional surgery as well as transcatheter valve therapies today.  Considering his young age, if he is felt to be a candidate for surgery, mechanical aortic valve replacement might be a reasonable consideration.  He will need to undergo further evaluation with cardiac catheterization to define coronary anatomy and hemodynamics. I have reviewed the risks, indications, and alternatives to cardiac catheterization, possible angioplasty, and stenting with the patient. Risks include but are not limited to bleeding, infection, vascular injury, stroke, myocardial infection, arrhythmia, kidney injury, radiation-related injury in the case of prolonged fluoroscopy use, emergency cardiac surgery, and death. The patient understands the risks of serious complication is 1-2 in 0623 with  diagnostic cardiac cath and 1-2% or less with angioplasty/stenting.  Once his cardiac catheterization is completed, he will be referred for formal cardiac surgical evaluation.   Shared Decision Making/Informed Consent The risks [stroke (1 in 1000), death (1 in 1000), kidney failure [usually temporary] (1 in 500), bleeding (1 in 200), allergic reaction [possibly serious] (1 in 200)], benefits (diagnostic support and management of coronary artery disease) and alternatives of a cardiac catheterization were discussed in detail with [name] and he is willing to proceed.    Medication Adjustments/Labs and Tests Ordered: Current medicines are reviewed at length with the patient today.  Concerns regarding medicines are outlined above.  Orders Placed This Encounter  Procedures   Basic metabolic panel   CBC   EKG 12-Lead   No orders of the defined types were placed in this encounter.   Patient Instructions  Medication Instructions:  Your physician recommends that you continue on your current medications as directed. Please refer to the Current Medication list given to you today.  *If you need a refill on your cardiac medications before your next appointment, please call your pharmacy*   Lab Work: BMET and CBC today  If you have labs (blood work) drawn today and your tests are completely normal, you will receive your results only by: [place] (if you have MyChart) OR A paper copy in the mail If you have any lab test that is abnormal or we need to change your treatment, we will call you to review the results.   Testing/Procedures: Your physician has requested that you have a cardiac catheterization. Cardiac catheterization is used to diagnose and/or treat various heart conditions. Doctors may recommend this procedure for a number of different reasons. The most common reason is to evaluate chest pain. Chest pain can be a symptom of coronary artery disease (CAD), and cardiac  catheterization can show whether plaque is narrowing  or blocking your heart's arteries. This procedure is also used to evaluate the valves, as well as measure the blood flow and oxygen levels in different parts of your heart. For further information please visit [facility]. Please follow instruction sheet, as given.    Follow-Up:  Follow up will be based on next steps for valve work up.  Other Instructions   [place] OFFICE [address] [place] [address] Dept: [phone] Loc: [phone]  [name]  02/24/2021  You are scheduled for a Cardiac Catheterization on Wednesday, October 12 with Dr. Sherren Mocha.  1. Please arrive at the [facility] (Main Entrance A) at [facility]: [address] [place], [place] [address] at 6:30 AM (This time is two hours before your procedure to ensure your preparation). Free valet parking service is available.   Special note: Every effort is made to have your procedure done on time. Please understand that emergencies sometimes delay scheduled procedures.  2. Diet: Do not eat solid foods after midnight.  The patient may have clear liquids until 5am upon the day of the procedure.  3. Labs: You will have labs drawn today. You do not need to be fasting.  4. Medication instructions in preparation for your procedure:   Contrast Allergy: No  You will need to hold your Spironolactone the morning of your procedure.  On the morning of your procedure, take your Aspirin and any morning medicines NOT listed above.  You may use sips of water.  5. Plan for one night stay--bring personal belongings. 6. Bring a current list of your medications and current insurance cards. 7. You MUST have a responsible person to drive you home. 8. Someone MUST be with you the first 24 hours after you arrive home or your discharge will be delayed. 9. Please  wear clothes that are easy to get on and off and wear slip-on shoes.  Thank you for allowing [place] to care for you!   -- [facility] Invasive Cardiovascular services     Signed, Sherren Mocha, MD  03/01/2021 5:11 AM    [place]

## 2021-02-25 LAB — CBC
Hematocrit: 43 % (ref 37.5–51.0)
Hemoglobin: 14.5 g/dL (ref 13.0–17.7)
MCH: 29.5 pg (ref 26.6–33.0)
MCHC: 33.7 g/dL (ref 31.5–35.7)
MCV: 87 fL (ref 79–97)
Platelets: 164 10*3/uL (ref 150–450)
RBC: 4.92 x10E6/uL (ref 4.14–5.80)
RDW: 11.1 % — ABNORMAL LOW (ref 11.6–15.4)
WBC: 4.1 10*3/uL (ref 3.4–10.8)

## 2021-02-25 LAB — BASIC METABOLIC PANEL
BUN/Creatinine Ratio: 9 (ref 9–20)
BUN: 11 mg/dL (ref 6–24)
CO2: 25 mmol/L (ref 20–29)
Calcium: 9.7 mg/dL (ref 8.7–10.2)
Chloride: 102 mmol/L (ref 96–106)
Creatinine, Ser: 1.26 mg/dL (ref 0.76–1.27)
Glucose: 92 mg/dL (ref 70–99)
Potassium: 4.9 mmol/L (ref 3.5–5.2)
Sodium: 140 mmol/L (ref 134–144)
eGFR: 71 mL/min/{1.73_m2} (ref >59)

## 2021-02-28 ENCOUNTER — Telehealth: Payer: Self-pay | Admitting: *Deleted

## 2021-02-28 NOTE — Telephone Encounter (Addendum)
Cardiac catheterization scheduled at [facility] for: Wednesday March 01, 2021 8:30 AM [facility] Main Entrance A [facility]) at: 6:30 AM   No solid food after midnight prior to cath, clear liquids until 5 AM day of procedure.  Medication instructions: Hold: Spironolactone-AM of procedure   Except hold medications usual morning medications can be taken pre-cath with sips of water including aspirin 81 mg.    Confirmed patient has responsible adult to drive home post procedure and be with patient first 24 hours after arriving home.  [facility] does allow one visitor to accompany you and wait in the hospital waiting room while you are there for your procedure. You and your visitor will be asked to wear a mask once you enter the hospital.   Patient reports does not currently have any symptoms concerning for COVID-19 and no household members with COVID-19 like illness.      Reviewed procedure/mask/visitor instructions with patient.

## 2021-03-01 ENCOUNTER — Encounter: Payer: Self-pay | Admitting: Cardiovascular Disease

## 2021-03-01 ENCOUNTER — Other Ambulatory Visit: Payer: Self-pay

## 2021-03-01 ENCOUNTER — Ambulatory Visit (HOSPITAL_COMMUNITY)
Admission: RE | Admit: 2021-03-01 | Discharge: 2021-03-01 | Disposition: A | Discharge status: Home / Self Care | Payer: 59 | Attending: Cardiovascular Disease | Admitting: Cardiovascular Disease

## 2021-03-01 ENCOUNTER — Encounter (HOSPITAL_COMMUNITY)
Admission: RE | Disposition: A | Discharge status: Home / Self Care | Payer: Self-pay | Source: Home / Self Care | Attending: Cardiovascular Disease

## 2021-03-01 DIAGNOSIS — Z8249 Family history of ischemic heart disease and other diseases of the circulatory system: Secondary | ICD-10-CM | POA: Diagnosis not present

## 2021-03-01 DIAGNOSIS — Z91013 Allergy to seafood: Secondary | ICD-10-CM | POA: Diagnosis not present

## 2021-03-01 DIAGNOSIS — Z953 Presence of xenogenic heart valve: Secondary | ICD-10-CM | POA: Diagnosis not present

## 2021-03-01 DIAGNOSIS — I350 Nonrheumatic aortic (valve) stenosis: Secondary | ICD-10-CM | POA: Insufficient documentation

## 2021-03-01 DIAGNOSIS — Z79899 Other long term (current) drug therapy: Secondary | ICD-10-CM | POA: Diagnosis not present

## 2021-03-01 DIAGNOSIS — I2582 Chronic total occlusion of coronary artery: Secondary | ICD-10-CM | POA: Insufficient documentation

## 2021-03-01 DIAGNOSIS — I2510 Atherosclerotic heart disease of native coronary artery without angina pectoris: Secondary | ICD-10-CM | POA: Insufficient documentation

## 2021-03-01 HISTORY — PX: RIGHT/LEFT HEART CATH AND CORONARY ANGIOGRAPHY: CATH118266

## 2021-03-01 LAB — POCT I-STAT EG7
Acid-Base Excess: 1 mmol/L (ref 0.0–2.0)
Acid-Base Excess: 1 mmol/L (ref 0.0–2.0)
Bicarbonate: 27.3 mmol/L (ref 20.0–28.0)
Bicarbonate: 28.1 mmol/L — ABNORMAL HIGH (ref 20.0–28.0)
Calcium, Ion: 1.21 mmol/L (ref 1.15–1.40)
Calcium, Ion: 1.29 mmol/L (ref 1.15–1.40)
HCT: 41 % (ref 39.0–52.0)
HCT: 42 % (ref 39.0–52.0)
Hemoglobin: 13.9 g/dL (ref 13.0–17.0)
Hemoglobin: 14.3 g/dL (ref 13.0–17.0)
O2 Saturation: 72 %
O2 Saturation: 77 %
Potassium: 4 mmol/L (ref 3.5–5.1)
Potassium: 4.3 mmol/L (ref 3.5–5.1)
Sodium: 141 mmol/L (ref 135–145)
Sodium: 140 mmol/L (ref 135–145)
TCO2: 29 mmol/L (ref 22–32)
TCO2: 30 mmol/L (ref 22–32)
pCO2, Ven: 49.7 mmHg (ref 44.0–60.0)
pCO2, Ven: 52.1 mmHg (ref 44.0–60.0)
pH, Ven: 7.347 (ref 7.250–7.430)
pH, Ven: 7.34 (ref 7.250–7.430)
pO2, Ven: 40 mmHg (ref 32.0–45.0)
pO2, Ven: 45 mmHg (ref 32.0–45.0)

## 2021-03-01 SURGERY — RIGHT/LEFT HEART CATH AND CORONARY ANGIOGRAPHY
Anesthesia: LOCAL

## 2021-03-01 MED ORDER — SODIUM CHLORIDE 0.9 % IV SOLN: INTRAVENOUS | Status: DC

## 2021-03-01 MED ORDER — MIDAZOLAM HCL 2 MG/2ML IJ SOLN
INTRAMUSCULAR | Status: DC | PRN
  Administered 2021-03-01: 2 mg via INTRAVENOUS

## 2021-03-01 MED ORDER — HEPARIN (PORCINE) IN NACL 1000-0.9 UT/500ML-% IV SOLN
INTRAVENOUS | Status: DC | PRN
  Administered 2021-03-01 (×2): 500 mL

## 2021-03-01 MED ORDER — LABETALOL HCL 5 MG/ML IV SOLN: 10.0000 mg | INTRAVENOUS | Status: DC | PRN

## 2021-03-01 MED ORDER — SODIUM CHLORIDE 0.9 % IV SOLN: 250.0000 mL | INTRAVENOUS | Status: DC | PRN

## 2021-03-01 MED ORDER — VERAPAMIL HCL 2.5 MG/ML IV SOLN
INTRAVENOUS | Status: AC
  Filled 2021-03-01: qty 2

## 2021-03-01 MED ORDER — LIDOCAINE HCL (PF) 1 % IJ SOLN
INTRAMUSCULAR | Status: AC
  Filled 2021-03-01: qty 30

## 2021-03-01 MED ORDER — HYDRALAZINE HCL 20 MG/ML IJ SOLN: 10.0000 mg | INTRAMUSCULAR | Status: DC | PRN

## 2021-03-01 MED ORDER — ONDANSETRON HCL 4 MG/2ML IJ SOLN: 4.0000 mg | Freq: Four times a day (QID) | INTRAMUSCULAR | Status: DC | PRN

## 2021-03-01 MED ORDER — SODIUM CHLORIDE 0.9% FLUSH: 3.0000 mL | Freq: Two times a day (BID) | INTRAVENOUS | Status: DC

## 2021-03-01 MED ORDER — ASPIRIN 81 MG PO CHEW: 81.0000 mg | CHEWABLE_TABLET | ORAL | Status: DC

## 2021-03-01 MED ORDER — IOHEXOL 350 MG/ML SOLN
INTRAVENOUS | Status: DC | PRN
  Administered 2021-03-01: 60 mL

## 2021-03-01 MED ORDER — VERAPAMIL HCL 2.5 MG/ML IV SOLN
INTRAVENOUS | Status: DC | PRN
  Administered 2021-03-01: 10 mL via INTRA_ARTERIAL

## 2021-03-01 MED ORDER — SODIUM CHLORIDE 0.9% FLUSH: 3.0000 mL | INTRAVENOUS | Status: DC | PRN

## 2021-03-01 MED ORDER — LIDOCAINE HCL (PF) 1 % IJ SOLN
INTRAMUSCULAR | Status: DC | PRN
  Administered 2021-03-01 (×2): 2 mL

## 2021-03-01 MED ORDER — ACETAMINOPHEN 325 MG PO TABS: 650.0000 mg | ORAL_TABLET | ORAL | Status: DC | PRN

## 2021-03-01 MED ORDER — MIDAZOLAM HCL 2 MG/2ML IJ SOLN
INTRAMUSCULAR | Status: AC
  Filled 2021-03-01: qty 2

## 2021-03-01 MED ORDER — HEPARIN (PORCINE) IN NACL 1000-0.9 UT/500ML-% IV SOLN
INTRAVENOUS | Status: AC
  Filled 2021-03-01: qty 1000

## 2021-03-01 MED ORDER — FENTANYL CITRATE (PF) 100 MCG/2ML IJ SOLN
INTRAMUSCULAR | Status: AC
  Filled 2021-03-01: qty 2

## 2021-03-01 MED ORDER — FENTANYL CITRATE (PF) 100 MCG/2ML IJ SOLN
INTRAMUSCULAR | Status: DC | PRN
  Administered 2021-03-01: 50 ug via INTRAVENOUS

## 2021-03-01 MED ORDER — HEPARIN SODIUM (PORCINE) 1000 UNIT/ML IJ SOLN
INTRAMUSCULAR | Status: AC
  Filled 2021-03-01: qty 1

## 2021-03-01 MED ORDER — HEPARIN SODIUM (PORCINE) 1000 UNIT/ML IJ SOLN
INTRAMUSCULAR | Status: DC | PRN
  Administered 2021-03-01: 5000 [IU] via INTRAVENOUS

## 2021-03-01 SURGICAL SUPPLY — 15 items
CATH 5FR JL3.5 JR4 ANG PIG MP (CATHETERS) ×1 IMPLANT
CATH BALLN WEDGE 5F 110CM (CATHETERS) ×1 IMPLANT
CATH EXPO 5F MPA-1 (CATHETERS) ×1 IMPLANT
CATH INFINITI 5FR AL1 (CATHETERS) ×1 IMPLANT
DEVICE RAD COMP TR BAND LRG (VASCULAR PRODUCTS) ×2 IMPLANT
GLIDESHEATH SLEND SS 6F .021 (SHEATH) ×1 IMPLANT
GUIDEWIRE INQWIRE 1.5J.035X260 (WIRE) IMPLANT
INQWIRE 1.5J .035X260CM (WIRE) ×2
KIT HEART LEFT (KITS) ×2 IMPLANT
PACK CARDIAC CATHETERIZATION (CUSTOM PROCEDURE TRAY) ×2 IMPLANT
SHEATH GLIDE SLENDER 4/5FR (SHEATH) ×2 IMPLANT
SHEATH PROBE COVER 6X72 (BAG) ×1 IMPLANT
TRANSDUCER W/STOPCOCK (MISCELLANEOUS) ×2 IMPLANT
TUBING CIL FLEX 10 FLL-RA (TUBING) ×2 IMPLANT
WIRE EMERALD ST .035X150CM (WIRE) ×2 IMPLANT

## 2021-03-01 NOTE — Interval H&P Note (Signed)
History and Physical Interval Note:  03/01/2021 8:38 AM  [name]  has presented today for surgery, with the diagnosis of aortic stenosis.  The various methods of treatment have been discussed with the patient and family. After consideration of risks, benefits and other options for treatment, the patient has consented to  Procedure(s): RIGHT/LEFT HEART CATH AND CORONARY ANGIOGRAPHY (N/A) as a surgical intervention.  The patient's history has been reviewed, patient examined, no change in status, stable for surgery.  I have reviewed the patient's chart and labs.  Questions were answered to the patient's satisfaction.     Sherren Mocha

## 2021-03-01 NOTE — Discharge Instructions (Signed)
Radial Site Care  This sheet gives you information about how to care for yourself after your procedure. Your health care provider may also give you more specific instructions. If you have problems or questions, contact your health care provider. What can I expect after the procedure? After the procedure, it is common to have: Bruising and tenderness at the catheter insertion area. Follow these instructions at home: Medicines Take over-the-counter and prescription medicines only as told by your health care provider. Insertion site care Follow instructions from your health care provider about how to take care of your insertion site. Make sure you: Wash your hands with soap and water before you remove your bandage (dressing). If soap and water are not available, use hand sanitizer. May remove dressing in 24 hours. Check your insertion site every day for signs of infection. Check for: Redness, swelling, or pain. Fluid or blood. Pus or a bad smell. Warmth. Do no take baths, swim, or use a hot tub for 5 days. You may shower 24-48 hours after the procedure. Remove the dressing and gently wash the site with plain soap and water. Pat the area dry with a clean towel. Do not rub the site. That could cause bleeding. Do not apply powder or lotion to the site. Activity  For 24 hours after the procedure, or as directed by your health care provider: Do not flex or bend the affected arm. Do not push or pull heavy objects with the affected arm. Do not drive yourself home from the hospital or clinic. You may drive 24 hours after the procedure. Do not operate machinery or power tools. KEEP ARM ELEVATED THE REMAINDER OF THE DAY. Do not push, pull or lift anything that is heavier than 10 lb for 5 days. Ask your health care provider when it is okay to: Return to work or school. Resume usual physical activities or sports. Resume sexual activity. General instructions If the catheter site starts to  bleed, raise your arm and put firm pressure on the site. If the bleeding does not stop, get help right away. This is a medical emergency. DRINK PLENTY OF FLUIDS FOR THE NEXT 2-3 DAYS. No alcohol consumption for 24 hours after receiving sedation. If you went home on the same day as your procedure, a responsible adult should be with you for the first 24 hours after you arrive home. Keep all follow-up visits as told by your health care provider. This is important. Contact a health care provider if: You have a fever. You have redness, swelling, or yellow drainage around your insertion site. Get help right away if: You have unusual pain at the radial site. The catheter insertion area swells very fast. The insertion area is bleeding, and the bleeding does not stop when you hold steady pressure on the area. Your arm or hand becomes pale, cool, tingly, or numb. These symptoms may represent a serious problem that is an emergency. Do not wait to see if the symptoms will go away. Get medical help right away. Call your local emergency services (911 in the [place] Do not drive yourself to the hospital. Summary After the procedure, it is common to have bruising and tenderness at the site. Follow instructions from your health care provider about how to take care of your radial site wound. Check the wound every day for signs of infection.  This information is not intended to replace advice given to you by your health care provider. Make sure you discuss any questions you have with   your health care provider. Document Revised: 06/12/2017 Document Reviewed: 06/12/2017 Elsevier Patient Education  2020 [employer]  

## 2021-03-17 ENCOUNTER — Other Ambulatory Visit: Payer: Self-pay

## 2021-03-17 ENCOUNTER — Ambulatory Visit (HOSPITAL_COMMUNITY)
Admission: RE | Admit: 2021-03-17 | Discharge: 2021-03-17 | Disposition: A | Discharge status: Home / Self Care | Payer: 59 | Source: Ambulatory Visit | Attending: Cardiovascular Disease | Admitting: Cardiovascular Disease

## 2021-03-17 DIAGNOSIS — Z953 Presence of xenogenic heart valve: Secondary | ICD-10-CM | POA: Insufficient documentation

## 2021-03-17 DIAGNOSIS — I350 Nonrheumatic aortic (valve) stenosis: Secondary | ICD-10-CM | POA: Diagnosis not present

## 2021-03-17 DIAGNOSIS — Z789 Other specified health status: Secondary | ICD-10-CM | POA: Diagnosis present

## 2021-03-17 MED ORDER — IOHEXOL 350 MG/ML SOLN
100.0000 mL | Freq: Once | INTRAVENOUS | Status: AC | PRN
  Administered 2021-03-17: 100 mL via INTRAVENOUS

## 2021-03-22 ENCOUNTER — Telehealth: Payer: Self-pay

## 2021-03-22 ENCOUNTER — Encounter: Payer: 59 | Admitting: Surgery

## 2021-03-22 NOTE — Telephone Encounter (Signed)
BSC alert Antitachycardia pacing (ATP) therapy delivered to convert arrhythmia. Ventricular shock therapy delivered to convert arrhythmia. Event occurred 11/2 @ 04:13 - 04:14, EGM's show sustained VT, rate 202, ATP delivered x1 unsuccessful.  Additional ATPx2 followed by HV therapy 31J successfully converting to VP/VS Known hx of VT with epicardial source  Pt medications include: Carvedilol 12.5mg  BID, Entresto 49-51mg  BID,  Magnesium Oxide 400mg  BID, Ranolazine 1000 mg BID  Spoke with pt, he was awakened by the event.  States he awoke SOB.  After that he was anxious about what happened.  Currently he denies any cardiac symptoms. Pt confirmed compliance with meds as ordered.   Reviewed Shock plan with patient advising if he should receive another shock  within 24 hours he needs to go to ED.    Informed patient of DMV restrictions- no operating MV for 6 months.  Pt stated he will need an MD note for his employer so that he can work remotely.    Scheduled patient for APP f/u on 03/24/21, advised would forward information to Dr. Caryl Comes for further recommendation.

## 2021-03-23 ENCOUNTER — Encounter: Payer: Self-pay | Admitting: *Deleted

## 2021-03-23 ENCOUNTER — Encounter: Payer: Self-pay | Admitting: Surgery

## 2021-03-23 ENCOUNTER — Institutional Professional Consult (permissible substitution): Payer: 59 | Admitting: Surgery

## 2021-03-23 ENCOUNTER — Other Ambulatory Visit: Payer: Self-pay | Admitting: *Deleted

## 2021-03-23 ENCOUNTER — Other Ambulatory Visit: Payer: Self-pay

## 2021-03-23 VITALS — BP 106/70 | HR 75 | Resp 20 | Ht 70.0 in | Wt 195.4 lb

## 2021-03-23 DIAGNOSIS — Z952 Presence of prosthetic heart valve: Secondary | ICD-10-CM | POA: Diagnosis not present

## 2021-03-23 DIAGNOSIS — I350 Nonrheumatic aortic (valve) stenosis: Secondary | ICD-10-CM | POA: Diagnosis not present

## 2021-03-23 NOTE — Progress Notes (Addendum)
Cardiology Office Note Date:  03/24/2021  Patient ID:  [name], [name], MRN [id number] PCP:  Delsa Grana, PA-C  Cardiologist:  Dr. Burt Knack Electrophysiologist: Dr. Caryl Comes    Chief Complaint: VT w/ICD tx  History of Present Illness: [name] is a 45 y.o. male with history of VSD (repaired surgically at 45 y/o), VHD (s/p bioprosthetic AVR age 28, complicated by RCA damage and IW infarct), VT (evaluated at [facility], found to be epicardial, too unstable to map, unable to access epicardial space, no ablation was done), ICD, CM, HTN   He was recently sent to Dr. Burt Knack for evaluation of his prosthetic AS, given young age, felt mechanical AVR may be the better option for him planned for cath Noted CTO of his RCA (with collaterals), severe AS Referred for surgical evaluation and consideration for re-do AVR  Device clinic alerted for VT w/ATP/HV tx 03/22/21, patient was woken and symptomatic  He saw Dr. Cyndia Bent 03/23/21 and planned for re-do AVR w/mechanical valve scheduled for 04/27/21   He comes today accompanied bu his wife He reports at the time of his shock he had woken suddenly very SOB, sat up and then was shocked. He says he recalls being shocked, though his wife recalls it differntly Says he woke very SOB then seemed to stiffen up, breathing changed and then was shocked, seemed to take a long time After the shock he woke immediately and states SOB was resolved  Since his cath procedure he feels like he has had a sense of "something" with his heart, wonders if something happened with the procedure Though in the ;ast few days this has settled.  No other symptoms No missed medicines   Device information BSCi single chamber ICD implanted 07/04/2006, gen change 05/05/2013  + hx of appropriate therapies 2013 VT 2017 MMVT/PMVT nonsustained 2020, VT with ATP accelerated rate and terminated piror to shock (diverted) 03/22/21: VT with ATP failed > HV therapy  successful  AAD hx Ranexa is all I find to date   Past Medical History:  Diagnosis Date   Anxiety    Aortic valve replaced    Requiring replacement, specifics not available   Hearing loss of both ears 12/22/2015   Heart murmur    Hypertension    IBD (inflammatory bowel disease) 09/23/2015   ICD (implantable cardiac defibrillator), BSX single    Obesity 12/22/2015   Secondary cardiomyopathy ([place])    Syncope    Ventricular septal defect    Ventricular tachycardia    appropriate VT shock therapy /13    Past Surgical History:  Procedure Laterality Date   ANGIOPLASTY     RCA repair with vein angioplasty following injury with the aforementioned surgery   AORTIC VALVE REPLACEMENT     Bioprosthesis   CARDIAC DEFIBRILLATOR PLACEMENT     CARDIAC VALVE REPLACEMENT     CHOLECYSTECTOMY     COLONOSCOPY WITH PROPOFOL N/A 12/06/2015   Procedure: COLONOSCOPY WITH PROPOFOL;  Surgeon: [name], MD;  Location: [facility] ENDOSCOPY;  Service: Endoscopy;  Laterality: N/A;   CORONARY ARTERY BYPASS GRAFT     IMPLANTABLE CARDIOVERTER DEFIBRILLATOR GENERATOR CHANGE N/A 05/05/2013   Procedure: IMPLANTABLE CARDIOVERTER DEFIBRILLATOR GENERATOR CHANGE;  Surgeon: Deboraha Sprang, MD;  Location: [facility] CATH LAB;  Service: Cardiovascular;  Laterality: N/A;   INSERT / REPLACE / REMOVE PACEMAKER     RIGHT/LEFT HEART CATH AND CORONARY ANGIOGRAPHY N/A 03/01/2021   Procedure: RIGHT/LEFT HEART CATH AND CORONARY ANGIOGRAPHY;  Surgeon: Sherren Mocha, MD;  Location: [facility] CV LAB;  Service: Cardiovascular;  Laterality: N/A;   VSD REPAIR      Current Outpatient Medications  Medication Sig Dispense Refill   acetaminophen (TYLENOL) 500 MG tablet Take 500 mg by mouth every 6 (six) hours as needed for moderate pain.     carvedilol (COREG) 12.5 MG tablet TAKE 1 TABLET TWICE A DAY 180 tablet 3   ENTRESTO 49-51 MG TAKE 1 TABLET TWICE A DAY 180 tablet 3   fluticasone (FLONASE) 50 MCG/ACT nasal spray Place 2 sprays into both  nostrils daily. (Patient taking differently: Place 2 sprays into both nostrils daily as needed for allergies.) 16 g 2   loratadine (CLARITIN) 5 MG chewable tablet Chew 1 tablet (5 mg total) by mouth daily as needed for allergies. 30 tablet 0   magnesium oxide (MAG-OX) 400 (241.3 Mg) MG tablet Take 1 tablet (400 mg total) by mouth 2 (two) times daily. 60 tablet 0   meclizine (ANTIVERT) 25 MG tablet Take 0.5-1 tablets (12.5-25 mg total) by mouth 3 (three) times daily as needed for dizziness. 30 tablet 2   Multiple Vitamin (MULTIVITAMIN) tablet Take 2 tablets by mouth daily.     ranolazine (RANEXA) 1000 MG SR tablet TAKE 1 TABLET TWICE A DAY 180 tablet 3   spironolactone (ALDACTONE) 25 MG tablet TAKE ONE-HALF (1/2) TABLET DAILY 45 tablet 3   triamcinolone cream (KENALOG) 0.1 % Apply 1 application topically 2 (two) times daily as needed. 30 g 0   umeclidinium bromide (INCRUSE ELLIPTA) 62.5 MCG/INH AEPB Inhale 1 puff into the lungs daily. (Patient taking differently: Inhale 1 puff into the lungs daily as needed (shortness of breath).) 30 each 5   No current facility-administered medications for this visit.    Allergies:   Shellfish allergy   Social History:  The patient  reports that he has never smoked. He has never used smokeless tobacco. He reports that he does not drink alcohol and does not use drugs.   Family History:  The patient's family history includes Cancer in his maternal grandfather and maternal grandmother; Heart disease in his father; Hypertension in his maternal grandfather and maternal grandmother.  ROS:  Please see the history of present illness.    All other systems are reviewed and otherwise negative.   PHYSICAL EXAM:  VS:  BP 90/75   Pulse 79   Ht 5\' 10"  (1.778 m)   Wt 195 lb (88.5 kg)   BMI 27.98 kg/m  BMI: Body mass index is 27.98 kg/m. Well nourished, well developed, in no acute distress HEENT: normocephalic, atraumatic Neck: no JVD, carotid bruits or  masses Cardiac:  RRR; 3/6 SM radiates to b/l neck, no rubs, or gallops Lungs:  CTA b/l, no wheezing, rhonchi or rales Abd: soft, nontender MS: no deformity or atrophy Ext: no edema Skin: warm and dry, no rash Neuro:  No gross deficits appreciated Psych: euthymic mood, full affect   ICD site is stable, no tethering or discomfort   EKG:  Done today and reviewed by myself shows  SR 69bpm, IVCD 181ms  Device interrogation done today and reviewed by myself:  Battery and lead measurements are good  03/22/21 he had 3 logged events Episode 133:NSVT>> this was sustained VT, avg V rate 190 (just below VT detection), however re-detected and did get ATP with this, unsuccessful and declared ended with reduction in HR  Episode 134: VT with ATP x1, avg V rate 202, AP slowed slight and declared ended, though was not  Episode 135: VT w/ATPx2 and 31J, had clean break in his VT with HV tx    03/01/21: R/LHC   Ost RCA to Prox RCA lesion is 100% stenosed.   There is severe aortic valve stenosis.   1.  Single-vessel coronary artery disease with total occlusion of the RCA, collateralized from the left coronary artery (this is known from the patient's history) 2.  Patent left main, LAD, and left circumflex with no obstructive coronary artery disease in those vessels.  The RCA is collateralized from septal perforators of the LAD 3.  Severe bioprosthetic aortic valve stenosis with mean gradient 57 mmHg, peak to peak gradient 74 mmHg, calculated aortic valve area 0.7 cm 4.  Normal right heart hemodynamics and preserved cardiac output   Recommendations: Cardiac surgical referral for consideration of redo aortic valve replacement    01/20/21: TTE IMPRESSIONS   1. Bioprosthetic aortic valve degenerative stenosis.   2. Left ventricular ejection fraction, by estimation, is 35 to 40%. The  left ventricle has moderately decreased function. The left ventricle  demonstrates global hypokinesis. The left  ventricular internal cavity size  was mildly dilated. There is mild  left ventricular hypertrophy. Left ventricular diastolic parameters are  indeterminate.   3. Right ventricular systolic function is normal. The right ventricular  size is normal.   4. The mitral valve is normal in structure. No evidence of mitral valve  regurgitation. No evidence of mitral stenosis.   5. The aortic valve has been repaired/replaced. Aortic valve  regurgitation is mild. Severe aortic valve stenosis. There is a  bioprosthetic valve present in the aortic position. Procedure Date: 2005.  Aortic valve area, by VTI measures 0.69 cm. Aortic  valve mean gradient measures 39.3 mmHg. Aortic valve Vmax measures 3.93  m/s.   6. The inferior vena cava is normal in size with greater than 50%  respiratory variability, suggesting right atrial pressure of 3 mmHg.   Comparison(s): Prior aortic valve mean gradient was 33 mmHg, with Vmax of  3.88 m/s.    Recent Labs: 02/24/2021: BUN 11; Creatinine, Ser 1.26; Platelets 164 03/01/2021: Hemoglobin 13.9; Hemoglobin 14.3; Potassium 4.0; Potassium 4.3; Sodium 141; Sodium 140  07/12/2020: Chol/HDL Ratio 4.1; Cholesterol, Total 156; HDL 38; LDL Chol Calc (NIH) 98; Triglycerides 110   CrCl cannot be calculated (Patient's most recent lab result is older than the maximum 21 days allowed.).   Wt Readings from Last 3 Encounters:  03/24/21 195 lb (88.5 kg)  03/23/21 195 lb 6.4 oz (88.6 kg)  03/01/21 194 lb (88 kg)     Other studies reviewed: Additional studies/records reviewed today include: summarized above  ASSESSMENT AND PLAN:  ICD Intact function Reviewed with Dr. Quentin Ore  VT rate dipped just below detection and despite ongoing VT was not treated as discussed above VF zone HR reduced to 220bpm VT zone rate was reduced to 185, ATP therapies changed to burst and increased to 4 ATPs prior to shock  Dr. Quentin Ore suggests given reported hx of epicardial VT, should consider  cryoablation by CTS at time of his AVR.. I send the EPS reports and discuss further with Dr. Caryl Comes and go from there. I have sent a secure chat message to Dr. Caryl Comes to start that conversation  The patient and wife are inclined to perhaps want to consider that if safe and able, he is less inclined to want to add Sotalol   VT D/w Dr. Caryl Comes last night Planned for Sotalol initiation at the time of his AVR hospitalization  BP does not allow for more BB  ICM Chronic CHF Echo with LVEF 35-40%, RV is OK No symptoms or exam findings of volume OL  CAD CTO of RCA No anginal symptoms A vague atypical sounding symptom that has resolved  Severe AS of his bioprosthetic AV Planned for re-do AVR w/mechanical AVR Scheduled for 04/27/21 with Dr. Cyndia Bent    Disposition: F/u with EP intra/hospitalization or post, pending clinical course  Current medicines are reviewed at length with the patient today.  The patient did not have any concerns regarding medicines.  Venetia Night, PA-C 03/24/2021 1:06 PM     [address] [place] [address] [phone] (office)  [phone] (fax)

## 2021-03-23 NOTE — Progress Notes (Signed)
Cardiothoracic Surgery Consultation  PCP is Delsa Grana, PA-C Referring Provider is Sherren Mocha, MD  Chief Complaint  Patient presents with   Aortic Stenosis    New patient consultation, bioprosthetic AS     HPI:  The patient is a 45 year old gentleman with a history of hypertension who underwent VSD repair at age 46 [address] [place]).  He reports having severe aortic insufficiency at age 63 and underwent bioprosthetic aortic valve replacement ([place]) using a 25 mm Perimount.  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  He said that he was given a choice to have a mechanical or bioprosthetic valve and chose to have a bioprosthetic valve so would not have to be on Coumadin.  He was subsequently found to have episodes of ventricular tachycardia in 2008 and underwent implantation of an ICD.  He has been followed by Dr. Caryl Comes.  He has had a few appropriate shocks for ventricular tachycardia over the years as well as ATP therapy with his most recent shock being yesterday.  He said that this does not happen very often.  He was evaluated at [facility] for a VT ablation but was found to have an epicardial source and it was not performed.  His echocardiograms have shown stable left ventricular systolic dysfunction with ejection fraction of 35 to 40%.  In 2020 the mean gradient across the prosthetic valve was 43 mmHg.  In 2021 the gradient was 33 mmHg.  His most recent echo in September 2022 showed a mean gradient of 42 mmHg.  There is no mitral regurgitation or stenosis.  Left ventricular ejection fraction is 35 to 40% with global hypokinesis.  Left ventricular cavity is moderately dilated at 6.2 cm in diastole and 5.1 cm in systole.  Stroke-volume index is 36.  He subsequently underwent cardiac catheterization on 03/01/2021.  This showed single-vessel coronary disease with chronic total occlusion of the RCA with faint collaterals from the left.   There is no other obstructive disease.  The mean gradient across the prosthetic aortic valve was 57 mmHg with a peak to peak gradient of 74 mmHg.  Valve area was calculated at 0.7 cm.  Right heart pressures were normal.  He reports generalized fatigue and shortness of breath with exertion.  He denies any chest pain or pressure.  He has had some dizziness.  He denies peripheral edema.   Past Medical History:  Diagnosis Date   Anxiety    Aortic valve replaced    Requiring replacement, specifics not available   Hearing loss of both ears 12/22/2015   Heart murmur    Hypertension    IBD (inflammatory bowel disease) 09/23/2015   ICD (implantable cardiac defibrillator), BSX single    Obesity 12/22/2015   Secondary cardiomyopathy ([place])    Syncope    Ventricular septal defect    Ventricular tachycardia    appropriate VT shock therapy /13    Past Surgical History:  Procedure Laterality Date   ANGIOPLASTY     RCA repair with vein angioplasty following injury with the aforementioned surgery   AORTIC VALVE REPLACEMENT     Bioprosthesis   CARDIAC DEFIBRILLATOR PLACEMENT     CARDIAC VALVE REPLACEMENT     CHOLECYSTECTOMY     COLONOSCOPY WITH PROPOFOL N/A 12/06/2015   Procedure: COLONOSCOPY WITH PROPOFOL;  Surgeon: [name], MD;  Location: [facility] ENDOSCOPY;  Service: Endoscopy;  Laterality: N/A;   CORONARY ARTERY BYPASS GRAFT     IMPLANTABLE CARDIOVERTER  DEFIBRILLATOR GENERATOR CHANGE N/A 05/05/2013   Procedure: IMPLANTABLE CARDIOVERTER DEFIBRILLATOR GENERATOR CHANGE;  Surgeon: Deboraha Sprang, MD;  Location: [facility] CATH LAB;  Service: Cardiovascular;  Laterality: N/A;   INSERT / REPLACE / REMOVE PACEMAKER     RIGHT/LEFT HEART CATH AND CORONARY ANGIOGRAPHY N/A 03/01/2021   Procedure: RIGHT/LEFT HEART CATH AND CORONARY ANGIOGRAPHY;  Surgeon: Sherren Mocha, MD;  Location: [facility];  Service: Cardiovascular;  Laterality: N/A;   VSD REPAIR      Family History  Problem Relation Age of Onset    Heart disease Father    Hypertension Maternal Grandmother    Cancer Maternal Grandmother    Hypertension Maternal Grandfather    Cancer Maternal Grandfather     Social History Social History   Tobacco Use   Smoking status: Never   Smokeless tobacco: Never  Vaping Use   Vaping Use: Never used  Substance Use Topics   Alcohol use: No   Drug use: No    Current Outpatient Medications  Medication Sig Dispense Refill   acetaminophen (TYLENOL) 500 MG tablet Take 500 mg by mouth every 6 (six) hours as needed for moderate pain.     carvedilol (COREG) 12.5 MG tablet TAKE 1 TABLET TWICE A DAY 180 tablet 3   ENTRESTO 49-51 MG TAKE 1 TABLET TWICE A DAY 180 tablet 3   fluticasone (FLONASE) 50 MCG/ACT nasal spray Place 2 sprays into both nostrils daily. (Patient taking differently: Place 2 sprays into both nostrils daily as needed for allergies.) 16 g 2   loratadine (CLARITIN) 5 MG chewable tablet Chew 1 tablet (5 mg total) by mouth daily as needed for allergies. 30 tablet 0   magnesium oxide (MAG-OX) 400 (241.3 Mg) MG tablet Take 1 tablet (400 mg total) by mouth 2 (two) times daily. 60 tablet 0   meclizine (ANTIVERT) 25 MG tablet Take 0.5-1 tablets (12.5-25 mg total) by mouth 3 (three) times daily as needed for dizziness. 30 tablet 2   Multiple Vitamin (MULTIVITAMIN) tablet Take 2 tablets by mouth daily.     ranolazine (RANEXA) 1000 MG SR tablet TAKE 1 TABLET TWICE A DAY 180 tablet 3   spironolactone (ALDACTONE) 25 MG tablet TAKE ONE-HALF (1/2) TABLET DAILY 45 tablet 3   triamcinolone cream (KENALOG) 0.1 % Apply 1 application topically 2 (two) times daily as needed. 30 g 0   umeclidinium bromide (INCRUSE ELLIPTA) 62.5 MCG/INH AEPB Inhale 1 puff into the lungs daily. (Patient taking differently: Inhale 1 puff into the lungs daily as needed (shortness of breath).) 30 each 5   No current facility-administered medications for this visit.    Allergies  Allergen Reactions   Shellfish Allergy  Nausea And Vomiting    Review of Systems  Constitutional:  Positive for fatigue.  HENT:  Positive for hearing loss.        Sees his dentist regularly  Eyes: Negative.   Respiratory:  Positive for shortness of breath.   Cardiovascular:  Negative for chest pain, palpitations and leg swelling.  Gastrointestinal: Negative.   Endocrine: Negative.   Genitourinary: Negative.   Musculoskeletal: Negative.   Allergic/Immunologic: Negative.   Neurological:  Positive for dizziness. Negative for syncope.  Hematological: Negative.   Psychiatric/Behavioral: Negative.     BP 106/70 (BP Location: Left Arm, Patient Position: Sitting, Cuff Size: Normal)   Pulse 75   Resp 20   Ht 5\' 10"  (1.778 m)   Wt 195 lb 6.4 oz (88.6 kg)   SpO2 98% Comment: RA  BMI 28.04 kg/m  Physical Exam Constitutional:      Appearance: Normal appearance. He is normal weight.  HENT:     Head: Normocephalic and atraumatic.  Eyes:     Extraocular Movements: Extraocular movements intact.     Conjunctiva/sclera: Conjunctivae normal.     Pupils: Pupils are equal, round, and reactive to light.  Cardiovascular:     Rate and Rhythm: Normal rate and regular rhythm.     Pulses: Normal pulses.     Heart sounds: Murmur heard.     Comments: 3/6 systolic murmur along the right sternal border. Pulmonary:     Effort: Pulmonary effort is normal.     Breath sounds: Normal breath sounds.  Abdominal:     General: Bowel sounds are normal. There is no distension.     Tenderness: There is no abdominal tenderness.  Musculoskeletal:        General: No swelling. Normal range of motion.     Cervical back: Normal range of motion and neck supple.  Skin:    General: Skin is warm and dry.  Neurological:     General: No focal deficit present.     Mental Status: He is alert and oriented to person, place, and time.  Psychiatric:        Mood and Affect: Mood normal.        Behavior: Behavior normal.     Diagnostic  Tests:  ECHOCARDIOGRAM REPORT         Patient Name:   NASIIR MONTS Date of Exam: 01/20/2021  Medical Rec #:  [id number]      Height:       70.0 in  Accession #:    [id number]     Weight:       192.0 lb  Date of Birth:  Jan 05, 1976      BSA:          2.051 m  Patient Age:    7 years       BP:           120/80 mmHg  Patient Gender: M              HR:           72 bpm.  Exam Location:  [place]   Procedure: 2D Echo, Cardiac Doppler and Color Doppler   Indications:    [id number] Prosthetic valve dysfunction     History:        Patient has prior history of Echocardiogram examinations,  most                  recent 02/02/2020. Cardiomyopathy, ICD, S/p AVR,  Arrythmias:PVC;                  Risk Factors:Hypertension.                  Aortic Valve: bioprosthetic valve is present in the aortic                  position. Procedure Date: 2005.     Sonographer:    Coralyn Helling RDCS  Referring Phys: [place]     1. Bioprosthetic aortic valve degenerative stenosis.   2. Left ventricular ejection fraction, by estimation, is 35 to 40%. The  left ventricle has moderately decreased function. The left ventricle  demonstrates global hypokinesis. The left ventricular internal cavity size  was mildly dilated. There is mild  left ventricular hypertrophy. Left  ventricular diastolic parameters are  indeterminate.   3. Right ventricular systolic function is normal. The right ventricular  size is normal.   4. The mitral valve is normal in structure. No evidence of mitral valve  regurgitation. No evidence of mitral stenosis.   5. The aortic valve has been repaired/replaced. Aortic valve  regurgitation is mild. Severe aortic valve stenosis. There is a  bioprosthetic valve present in the aortic position. Procedure Date: 2005.  Aortic valve area, by VTI measures 0.69 cm. Aortic  valve mean gradient measures 39.3 mmHg. Aortic valve Vmax measures 3.93  m/s.   6. The  inferior vena cava is normal in size with greater than 50%  respiratory variability, suggesting right atrial pressure of 3 mmHg.   Comparison(s): Prior aortic valve mean gradient was 33 mmHg, with Vmax of  3.88 m/s.   FINDINGS   Left Ventricle: Left ventricular ejection fraction, by estimation, is 35  to 40%. The left ventricle has moderately decreased function. The left  ventricle demonstrates global hypokinesis. The left ventricular internal  cavity size was mildly dilated.  There is mild left ventricular hypertrophy. Left ventricular diastolic  parameters are indeterminate.      LV Wall Scoring:  The inferior wall is akinetic.   Right Ventricle: The right ventricular size is normal. No increase in  right ventricular wall thickness. Right ventricular systolic function is  normal.   Left Atrium: Left atrial size was normal in size.   Right Atrium: Right atrial size was normal in size.   Pericardium: There is no evidence of pericardial effusion.   Mitral Valve: The mitral valve is normal in structure. No evidence of  mitral valve regurgitation. No evidence of mitral valve stenosis.   Tricuspid Valve: The tricuspid valve is normal in structure. Tricuspid  valve regurgitation is not demonstrated. No evidence of tricuspid  stenosis.   Aortic Valve: The aortic valve has been repaired/replaced. Aortic valve  regurgitation is mild. Aortic regurgitation PHT measures 575 msec. Severe  aortic stenosis is present. Aortic valve mean gradient measures 39.3 mmHg.  Aortic valve peak gradient  measures 61.9 mmHg. Aortic valve area, by VTI measures 0.69 cm. There is  a bioprosthetic valve present in the aortic position. Procedure Date:  2005.   Pulmonic Valve: The pulmonic valve was normal in structure. Pulmonic valve  regurgitation is not visualized. No evidence of pulmonic stenosis.   Aorta: The aortic root is normal in size and structure.   Venous: The inferior vena cava is normal  in size with greater than 50%  respiratory variability, suggesting right atrial pressure of 3 mmHg.   IAS/Shunts: No atrial level shunt detected by color flow Doppler.   Additional Comments: Bioprosthetic aortic valve degenerative stenosis. A  device lead is visualized.      LEFT VENTRICLE  PLAX 2D  LVIDd:         6.20 cm  Diastology  LVIDs:         5.10 cm  LV e' medial:    6.74 cm/s  LV PW:         1.00 cm  LV E/e' medial:  9.8  LV IVS:        1.30 cm  LV e' lateral:   8.59 cm/s  LVOT diam:     2.90 cm  LV E/e' lateral: 7.7  LV SV:         73  LV SV Index:   36  LVOT Area:     6.61 cm  RIGHT VENTRICLE            IVC  RV S prime:     8.38 cm/s  IVC diam: 1.10 cm  TAPSE (M-mode): 1.4 cm   LEFT ATRIUM           Index       RIGHT ATRIUM           Index  LA diam:      4.10 cm 2.00 cm/m  RA Pressure: 3.00 mmHg  LA Vol (A2C): 74.0 ml 36.07 ml/m RA Area:     15.30 cm                                    RA Volume:   41.50 ml  20.23 ml/m   AORTIC VALVE  AV Area (Vmax):    0.75 cm  AV Area (Vmean):   0.80 cm  AV Area (VTI):     0.69 cm  AV Vmax:           393.33 cm/s  AV Vmean:          297.667 cm/s  AV VTI:            1.061 m  AV Peak Grad:      61.9 mmHg  AV Mean Grad:      39.3 mmHg  LVOT Vmax:         44.80 cm/s  LVOT Vmean:        36.000 cm/s  LVOT VTI:          0.111 m  LVOT/AV VTI ratio: 0.10  AI PHT:            575 msec     AORTA  Ao Root diam: 3.70 cm  Ao Asc diam:  3.10 cm   MITRAL VALVE               TRICUSPID VALVE  MV Area (PHT): 3.87 cm    Estimated RAP:  3.00 mmHg  MV Decel Time: 196 msec  MV E velocity: 66.00 cm/s  SHUNTS  MV A velocity: 53.60 cm/s  Systemic VTI:  0.11 m  MV E/A ratio:  1.23        Systemic Diam: 2.90 cm   Candee Furbish MD  Electronically signed by Candee Furbish MD  Signature Date/Time: 01/20/2021/11:19:13 AM         Final     Physicians  Panel Physicians Referring Physician Case Authorizing Physician  Sherren Mocha,  MD (Primary)     Procedures  RIGHT/LEFT HEART CATH AND CORONARY ANGIOGRAPHY   Conclusion      Ost RCA to Prox RCA lesion is 100% stenosed.   There is severe aortic valve stenosis.   1.  Single-vessel coronary artery disease with total occlusion of the RCA, collateralized from the left coronary artery (this is known from the patient's history) 2.  Patent left main, LAD, and left circumflex with no obstructive coronary artery disease in those vessels.  The RCA is collateralized from septal perforators of the LAD 3.  Severe bioprosthetic aortic valve stenosis with mean gradient 57 mmHg, peak to peak gradient 74 mmHg, calculated aortic valve area 0.7 cm 4.  Normal right heart hemodynamics and preserved cardiac output   Recommendations: Cardiac surgical referral for consideration of redo aortic valve replacement   Procedural Details  Technical Details INDICATION: Severe bioprosthetic aortic valve stenosis  PROCEDURAL DETAILS: There  was an indwelling IV in a right antecubital vein. Using normal sterile technique, the IV was changed out for a 5 Fr brachial sheath over a 0.018 inch wire. The right wrist was then prepped, draped, and anesthetized with 1% lidocaine. Using direct ultrasound guidance 5/6 French Slender sheath was placed in the right radial artery.  Ultrasound images are captured and digitally charted.  Intra-arterial verapamil was administered through the radial artery sheath. IV heparin was administered after a JR4 catheter was advanced into the central aorta. A Swan-Ganz catheter was used for the right heart catheterization. Standard protocol was followed for recording of right heart pressures and sampling of oxygen saturations. Fick cardiac output was calculated. Standard Judkins catheters were used for selective coronary angiography. LV pressure is recorded and an aortic valve pullback is performed.  The aortic valve is crossed with a multipurpose catheter and a straight tip wire.   Aortic valve pullback is performed with a multipurpose catheter.  There were no immediate procedural complications. The patient was transferred to the post catheterization recovery area for further monitoring.      Estimated blood loss <50 mL.   During this procedure medications were administered to achieve and maintain moderate conscious sedation while the patient's heart rate, blood pressure, and oxygen saturation were continuously monitored and I was present face-to-face 100% of this time.   Medications (Filter: Administrations occurring from [phone] to 0932 on 03/01/21)  important  Continuous medications are totaled by the amount administered until 03/01/21 0932.   Heparin (Porcine) in NaCl 1000-0.9 UT/500ML-% SOLN (mL) Total volume:  1,000 mL Date/Time Rate/Dose/Volume Action   03/01/21 0847 500 mL Given   0847 500 mL Given    midazolam (VERSED) injection (mg) Total dose:  2 mg Date/Time Rate/Dose/Volume Action   03/01/21 0855 2 mg Given    fentaNYL (SUBLIMAZE) injection (mcg) Total dose:  50 mcg Date/Time Rate/Dose/Volume Action   03/01/21 0855 50 mcg Given    lidocaine (PF) (XYLOCAINE) 1 % injection (mL) Total volume:  4 mL Date/Time Rate/Dose/Volume Action   03/01/21 0857 2 mL Given   Canceled Entry   0904 2 mL Given    Radial Cocktail/Verapamil only (mL) Total volume:  10 mL Date/Time Rate/Dose/Volume Action   03/01/21 0907 10 mL Given    heparin sodium (porcine) injection (Units) Total dose:  5,000 Units Date/Time Rate/Dose/Volume Action   03/01/21 0908 5,000 Units Given    iohexol (OMNIPAQUE) 350 MG/ML injection (mL) Total volume:  60 mL Date/Time Rate/Dose/Volume Action   03/01/21 0928 60 mL Given    Sedation Time  Sedation Time Physician-1: 31 minutes 35 seconds Contrast  Medication Name Total Dose  iohexol (OMNIPAQUE) 350 MG/ML injection 60 mL   Radiation/Fluoro  Fluoro time: 7.8 (min) DAP: 09381 (mGycm2) Cumulative Air Kerma: 281  (mGy) Coronary Findings  Diagnostic Dominance: Right Left Main  Vessel is angiographically normal.  Left Circumflex  Vessel is angiographically normal.  Right Coronary Artery  Ost RCA to Prox RCA lesion is 100% stenosed.  Right Posterior Descending Artery  Collaterals  RPDA filled by collaterals from 2nd Sept.    Collaterals  RPDA filled by collaterals from 1st Sept.    Intervention  No interventions have been documented. Left Heart  Aortic Valve There is severe aortic valve stenosis. The patient has a bioprosthetic aortic valve .   Coronary Diagrams  Diagnostic Dominance: Right Intervention  Implants     No implant documentation for this case.   Syngo Images   Show images  for CARDIAC CATHETERIZATION Images on Long Term Storage   Show images for Alvin Critchley to Procedure Log  Procedure Log    Hemo Data  Flowsheet Row Most Recent Value  Fick Cardiac Output 6.04 L/min  Fick Cardiac Output Index 2.93 (L/min)/BSA  Aortic Mean Gradient 56.9 mmHg  Aortic Peak Gradient 74 mmHg  Aortic Valve Area 0.70  Aortic Value Area Index 0.34 cm2/BSA  RA A Wave 7 mmHg  RA V Wave 6 mmHg  RA Mean 4 mmHg  RV Systolic Pressure 30 mmHg  RV Diastolic Pressure 0 mmHg  RV EDP 7 mmHg  PA Systolic Pressure 28 mmHg  PA Diastolic Pressure 9 mmHg  PA Mean 17 mmHg  PW A Wave 17 mmHg  PW V Wave 14 mmHg  PW Mean 10 mmHg  AO Systolic Pressure 95 mmHg  AO Diastolic Pressure 60 mmHg  AO Mean 76 mmHg  LV Systolic Pressure 053 mmHg  LV Diastolic Pressure 7 mmHg  LV EDP 15 mmHg  AOp Systolic Pressure 86 mmHg  AOp Diastolic Pressure 61 mmHg  AOp Mean Pressure 72 mmHg  LVp Systolic Pressure 976 mmHg  LVp Diastolic Pressure 8 mmHg  LVp EDP Pressure 17 mmHg  QP/QS 0.82  TPVR Index 5.8 HRUI  TSVR Index 25.93 HRUI  PVR SVR Ratio 0.1  TPVR/TSVR Ratio 0.22    ADDENDUM REPORT: 03/19/2021 17:32   CLINICAL DATA:  Prosthetic valve stenosis. 25 mm pericardial tissue valve  07/09/2001 at [facility]).   EXAM: Cardiac TAVR CT   TECHNIQUE: A non-contrast, gated CT scan was obtained with axial slices of 3 mm through the heart for aortic valve calcium scoring. A 100 kV retrospective, gated, contrast cardiac scan was obtained. Gantry rotation speed was 250 msecs and collimation was 0.6 mm. Nitroglycerin was not given. The 3D data set was reconstructed in 5% intervals of the 0-95% of the R-R cycle. Systolic and diastolic phases were analyzed on a dedicated workstation using MPR, MIP, and VRT modes. The patient received 100 cc of contrast.   FINDINGS: Image quality: Excellent.   Noise artifact is: Limited.   Aortic Valve: A 25 mm pericardial tissue valve is present in the aortic position. Implant date 07/09/2001 ([place] The valve is likely a Perimount valve. The valve leaflets are thickened and calcified consistent with structural valve deterioration. No thrombus is present.   Valve in Valve Analysis: ViV simulation performed for a 26 mm S3 TAVR performed with 80% offset into the aorta and 20% into the ventricle. The virtual transcatheter heart valve (THV) to coronary (VTC) distance for the left main is 5.6 mm. The ostium of the left main appears above the THV. The RCA is occluded at the ostium as noted in the prior operative report. The ostium is well above the THV regardless. Overall, there is no risk for coronary obstruction to the left main coronary artery.   Optimal coplanar projection: LAO 5 CRA 2   Sinus of Valsalva Measurements:   Non-coronary: 37 mm   Right-coronary: 36 mm   Left-coronary: 36 mm   Sinotubular Junction: 30 mm   Ascending Thoracic Aorta: 31 mm   Coronary Arteries: Normal coronary origin. Right dominance. Occluded RCA at the ostium. The left main, LAD, and LCX are normal.   Cardiac Morphology:   Right Atrium: Right atrial size is within normal limits.   Right Ventricle: The right ventricular  cavity is within normal limits. A single CIED lead is seen that  terminates in the RV.   Left Atrium: Left atrial size is normal in size with no left atrial appendage filling defect.   Left Ventricle: The ventricular cavity size is severely dilated. Moderately reduced LV function, EF=32%. The basal to mid infero septum and inferior segments are akinetic. The myocardium in this region is thinned consistent with prior RCA infarction.   Pulmonary arteries: Normal in size without proximal filling defect.   Pulmonary veins: Normal pulmonary venous drainage.   Pericardium: Normal thickness with no significant effusion or calcium present.   Mitral Valve: The mitral valve is normal structure without significant calcification.   Extra-cardiac findings: See attached radiology report for non-cardiac structures.   IMPRESSION: 1. 25 mm pericardial bioprosthesis (likely a Perimount prosthesis) in the aortic position with calcified and thickened leaflets consistent with structural valve deterioration.   2. Valve in valve analysis appropriate for 26 mm S3 TAVR. No risk of coronary obstruction.   3. Optimal Fluoroscopic Angle for Delivery: LAO 5 CRA 2   4. Severely dilated left ventricular cavity. Moderately reduced LV function, EF=32%. Regional wall motion abnormalities are consistent with prior RCA infarction.   [place] Bells T. Audie Box, MD     Electronically Signed   By: Eleonore Chiquito M.D.   On: 03/19/2021 17:32       Narrative & Impression  CLINICAL DATA:  Aortic stenosis, preop evaluation   EXAM: CT ANGIOGRAPHY CHEST, ABDOMEN AND PELVIS   TECHNIQUE: Multidetector CT imaging through the chest, abdomen and pelvis was performed using the standard protocol during bolus administration of intravenous contrast. Multiplanar reconstructed images and MIPs were obtained and reviewed to evaluate the vascular anatomy.   CONTRAST:  165mL OMNIPAQUE IOHEXOL 350 MG/ML SOLN   COMPARISON:   12/31/2016 and previous   FINDINGS: CTA CHEST FINDINGS   Cardiovascular: Left subclavian single lead AICD extends to the RV apex. There is mild primary left-sided cardiomegaly. No pericardial effusion. Satisfactory opacification of pulmonary arteries noted, and there is no evidence of pulmonary emboli. Left ventricular dilatation. Previous AVR. Good contrast opacification of the thoracic aorta. No dissection, aneurysm or stenosis. Bovine variant brachiocephalic arterial origin anatomy without proximal stenosis.   Mediastinum/Nodes: No mass or adenopathy.   Lungs/Pleura: No pleural effusion. Stable linear scarring or subsegmental atelectasis laterally in the right middle lobe. Lungs otherwise clear. No pneumothorax.   Musculoskeletal: Sternotomy wires. No fracture or worrisome bone lesion.   Review of the MIP images confirms the above findings.   CTA ABDOMEN AND PELVIS FINDINGS   VASCULAR   Aorta: Normal caliber aorta without aneurysm, dissection, vasculitis or significant stenosis.   Celiac: Patent without evidence of aneurysm, dissection, vasculitis or significant stenosis.   SMA: Patent without evidence of aneurysm, dissection, vasculitis or significant stenosis.   Renals: Both renal arteries are patent without evidence of aneurysm, dissection, vasculitis, fibromuscular dysplasia or significant stenosis.   IMA: Patent without evidence of aneurysm, dissection, vasculitis or significant stenosis.   Inflow: Mild calcified plaque in the common iliac arteries. No aneurysm, dissection, stenosis, or significant tortuosity.   Veins: No obvious venous abnormality within the limitations of this arterial phase study.   Review of the MIP images confirms the above findings.   NON-VASCULAR   Hepatobiliary: No focal liver abnormality is seen. Status post cholecystectomy. No biliary dilatation.   Pancreas: Unremarkable. No pancreatic ductal dilatation or surrounding  inflammatory changes.   Spleen: Normal in size without focal abnormality.   Adrenals/Urinary Tract: 1.8 cm right upper pole renal cyst, present since 09/23/2015.  No urolithiasis or hydronephrosis. Adrenal glands unremarkable. Urinary bladder incompletely distended, unremarkable.   Stomach/Bowel: Small hiatal hernia. Stomach is incompletely distended, unremarkable. The small bowel is decompressed. Normal appendix. The colon is nondilated , unremarkable.   Lymphatic: No abdominal or pelvic adenopathy.   Reproductive: Prostate enlargement   Other: No ascites.  No free air.   Musculoskeletal: No acute or significant osseous findings.   Review of the MIP images confirms the above findings.   IMPRESSION: 1. No aortic aneurysm, dissection, or stenosis. 2. Mild atheromatous plaque in common iliac arteries without significant stenosis.     Electronically Signed   By: Lucrezia Europe M.D.   On: 03/17/2021 16:44       Impression:  This 45 year old gentleman has severe prosthetic aortic valve stenosis with moderate left ventricular systolic dysfunction with an ejection fraction of 35 to 40% and moderate left ventricular dilatation.  Review of his prior echocardiograms dating back to 2013 showed an ejection fraction of 40 to 45% with an LV diastolic diameter of 5.9 cm.  I suspect his left ventricular dysfunction is probably related to his history of valvular heart disease as well as perioperative myocardial infarction from RCA occlusion.  I think the best treatment for him is redo aortic valve replacement using a mechanical valve.  He has no contraindication to anticoagulation.  I discussed valve in valve TAVR which I would not recommend due to his relatively young age and the risk of structural valve deterioration requiring further intervention.  I discussed the pros and cons of mechanical and bioprosthetic valves and the high risk of structural valve deterioration with a bioprosthetic valve at  his young age and he understands and agrees to proceed with a mechanical valve.  Review of his CTA of the chest shows some calcification of the ascending aorta extending from the sinotubular junction up to the takeoff of the innominate artery.  This appears fairly localized along  the right lateral surface of the aorta in a narrow band.  It may be necessary to replace the ascending aorta to allow crossclamping but that decision will need to be made in the operating room.  I reviewed the CTA images with him and explained that possibility.  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  I discussed the operative procedure with the patient including alternatives, benefits and risks; including but not limited to bleeding, blood transfusion, infection, stroke, myocardial infarction, heart block requiring a permanent pacemaker, organ dysfunction, and death.  [name] understands and agrees to proceed.    Plan:  He will be scheduled for redo aortic valve replacement using a mechanical valve and possible replacement of the ascending aorta under circulatory arrest on 04/27/2021.  We will have his [place] turned off in the holding area the morning of surgery.  I spent 60 minutes performing this consultation and > 50% of this time was spent face to face counseling and coordinating the care of this patient's severe prosthetic aortic valve stenosis.   Gaye Pollack, MD [facility] [phone]

## 2021-03-24 ENCOUNTER — Ambulatory Visit: Payer: 59 | Admitting: Physician Assistant

## 2021-03-24 ENCOUNTER — Encounter: Payer: Self-pay | Admitting: Physician Assistant

## 2021-03-24 VITALS — BP 90/75 | HR 79 | Ht 70.0 in | Wt 195.0 lb

## 2021-03-24 DIAGNOSIS — I472 Ventricular tachycardia, unspecified: Secondary | ICD-10-CM | POA: Diagnosis not present

## 2021-03-24 DIAGNOSIS — I2510 Atherosclerotic heart disease of native coronary artery without angina pectoris: Secondary | ICD-10-CM

## 2021-03-24 DIAGNOSIS — I429 Cardiomyopathy, unspecified: Secondary | ICD-10-CM

## 2021-03-24 DIAGNOSIS — I255 Ischemic cardiomyopathy: Secondary | ICD-10-CM

## 2021-03-24 DIAGNOSIS — Z95810 Presence of automatic (implantable) cardiac defibrillator: Secondary | ICD-10-CM

## 2021-03-24 DIAGNOSIS — I350 Nonrheumatic aortic (valve) stenosis: Secondary | ICD-10-CM

## 2021-03-24 LAB — CUP PACEART INCLINIC DEVICE CHECK
Date Time Interrogation Session: 20221104184009
HighPow Impedance: 38 Ohm
HighPow Impedance: 53 Ohm
Implantable Lead Implant Date: 20080214
Implantable Lead Location: 753860
Implantable Lead Model: 185
Implantable Lead Serial Number: 178017
Implantable Pulse Generator Implant Date: 20141216
Lead Channel Impedance Value: 651 Ohm
Lead Channel Pacing Threshold Amplitude: 1 V
Lead Channel Pacing Threshold Pulse Width: 0.4 ms
Lead Channel Sensing Intrinsic Amplitude: 17.9 mV
Lead Channel Setting Pacing Amplitude: 2 V
Lead Channel Setting Pacing Pulse Width: 0.4 ms
Lead Channel Setting Sensing Sensitivity: 0.6 mV
Pulse Gen Serial Number: 126781

## 2021-03-24 NOTE — Patient Instructions (Addendum)
Medication Instructions:   Your physician recommends that you continue on your current medications as directed. Please refer to the Current Medication list given to you today.   *If you need a refill on your cardiac medications before your next appointment, please call your pharmacy*   Lab Work: [place]   If you have labs (blood work) drawn today and your tests are completely normal, you will receive your results only by: [place] (if you have MyChart) OR A paper copy in the mail If you have any lab test that is abnormal or we need to change your treatment, we will call you to review the results.   Testing/Procedures:    NONE ORDERED  TODAY     Follow-Up: At [facility], you and your health needs are our priority.  As part of our continuing mission to provide you with exceptional heart care, we have created designated Provider Care Teams.  These Care Teams include your primary Cardiologist (physician) and Advanced Practice Providers (APPs -  Physician Assistants and Nurse Practitioners) who all work together to provide you with the care you need, when you need it.  We recommend signing up for the patient portal called "MyChart".  Sign up information is provided on this After Visit Summary.  MyChart is used to connect with patients for Virtual Visits (Telemedicine).  Patients are able to view lab/test results, encounter notes, upcoming appointments, etc.  Non-urgent messages can be sent to your provider as well.   To learn more about what you can do with MyChart, go to [email or web address].    Your next appointment:   6-8  week(s)  The format for your next appointment:   In Person  Provider:  Dr. Caryl Comes ONLY {   Other Instructions

## 2021-03-31 ENCOUNTER — Telehealth: Payer: Self-pay

## 2021-03-31 NOTE — Telephone Encounter (Signed)
FMLA form completed and faxed to [facility] @ [phone]. Beginning leave 04/27/21 through approx 07/24/21. Forms mail to home address.

## 2021-04-06 ENCOUNTER — Encounter: Payer: 59 | Admitting: Surgery

## 2021-04-10 ENCOUNTER — Telehealth: Payer: Self-pay

## 2021-04-10 NOTE — Telephone Encounter (Signed)
Spoke with pt and advised of Dr Olin Pia comments below.  Pt states he is already scheduled for surgery of aortic valve at [facility] on 04/27/2021 with Dr Cyndia Bent.

## 2021-04-10 NOTE — Telephone Encounter (Signed)
-----   Message from Deboraha Sprang, MD sent at 03/27/2021  8:35 PM EST ----- Rosann Auerbach  could you please let the patient know I have reached out to Dr Lurene Shadow to see if they could do a combined aortic valve surgery and VT procedure at [facility],  He told me he would look into it but have not heard back from him yet Thanks SK    ----- Message ----- From: Baldwin Jamaica, PA-C Sent: 03/24/2021   1:37 PM EST To: Deboraha Sprang, MD

## 2021-04-15 ENCOUNTER — Encounter: Payer: Self-pay | Admitting: Cardiovascular Disease

## 2021-04-24 ENCOUNTER — Encounter: Payer: Self-pay | Admitting: Internal Medicine

## 2021-04-25 ENCOUNTER — Encounter: Payer: Self-pay | Admitting: Internal Medicine

## 2021-04-25 ENCOUNTER — Encounter (HOSPITAL_COMMUNITY): Payer: Self-pay

## 2021-04-25 ENCOUNTER — Encounter (HOSPITAL_COMMUNITY)
Admission: RE | Admit: 2021-04-25 | Discharge: 2021-04-25 | Disposition: A | Discharge status: Home / Self Care | Payer: 59 | Source: Ambulatory Visit | Attending: Surgery | Admitting: Surgery

## 2021-04-25 ENCOUNTER — Ambulatory Visit (HOSPITAL_BASED_OUTPATIENT_CLINIC_OR_DEPARTMENT_OTHER)
Admission: RE | Admit: 2021-04-25 | Discharge: 2021-04-25 | Disposition: A | Discharge status: Home / Self Care | Payer: 59 | Source: Ambulatory Visit | Attending: Surgery | Admitting: Surgery

## 2021-04-25 ENCOUNTER — Other Ambulatory Visit: Payer: Self-pay

## 2021-04-25 VITALS — BP 117/70 | HR 67 | Temp 97.9°F | Resp 18 | Ht 70.0 in | Wt 195.6 lb

## 2021-04-25 DIAGNOSIS — I350 Nonrheumatic aortic (valve) stenosis: Secondary | ICD-10-CM | POA: Insufficient documentation

## 2021-04-25 DIAGNOSIS — Z952 Presence of prosthetic heart valve: Secondary | ICD-10-CM | POA: Insufficient documentation

## 2021-04-25 DIAGNOSIS — I110 Hypertensive heart disease with heart failure: Secondary | ICD-10-CM | POA: Insufficient documentation

## 2021-04-25 DIAGNOSIS — Z95810 Presence of automatic (implantable) cardiac defibrillator: Secondary | ICD-10-CM | POA: Insufficient documentation

## 2021-04-25 DIAGNOSIS — I509 Heart failure, unspecified: Secondary | ICD-10-CM | POA: Insufficient documentation

## 2021-04-25 DIAGNOSIS — Z01818 Encounter for other preprocedural examination: Secondary | ICD-10-CM | POA: Insufficient documentation

## 2021-04-25 DIAGNOSIS — T82858A Stenosis of vascular prosthetic devices, implants and grafts, initial encounter: Secondary | ICD-10-CM | POA: Insufficient documentation

## 2021-04-25 DIAGNOSIS — Z20822 Contact with and (suspected) exposure to covid-19: Secondary | ICD-10-CM | POA: Insufficient documentation

## 2021-04-25 DIAGNOSIS — Y838 Other surgical procedures as the cause of abnormal reaction of the patient, or of later complication, without mention of misadventure at the time of the procedure: Secondary | ICD-10-CM | POA: Insufficient documentation

## 2021-04-25 DIAGNOSIS — Z8774 Personal history of (corrected) congenital malformations of heart and circulatory system: Secondary | ICD-10-CM | POA: Insufficient documentation

## 2021-04-25 HISTORY — DX: Acute myocardial infarction, unspecified: I21.9

## 2021-04-25 HISTORY — DX: Other complications of anesthesia, initial encounter: T88.59XA

## 2021-04-25 HISTORY — DX: Presence of automatic (implantable) cardiac defibrillator: Z95.810

## 2021-04-25 HISTORY — DX: Nausea with vomiting, unspecified: R11.2

## 2021-04-25 HISTORY — DX: Other specified postprocedural states: Z98.890

## 2021-04-25 HISTORY — DX: Cardiac arrhythmia, unspecified: I49.9

## 2021-04-25 LAB — CBC
HCT: 42.6 % (ref 39.0–52.0)
Hemoglobin: 14.2 g/dL (ref 13.0–17.0)
MCH: 29.8 pg (ref 26.0–34.0)
MCHC: 33.3 g/dL (ref 30.0–36.0)
MCV: 89.5 fL (ref 80.0–100.0)
Platelets: 160 10*3/uL (ref 150–400)
RBC: 4.76 MIL/uL (ref 4.22–5.81)
RDW: 11.1 % — ABNORMAL LOW (ref 11.5–15.5)
WBC: 4.3 10*3/uL (ref 4.0–10.5)
nRBC: 0 % (ref 0.0–0.2)

## 2021-04-25 LAB — SURGICAL PCR SCREEN
MRSA, PCR: NEGATIVE
Staphylococcus aureus: NEGATIVE

## 2021-04-25 LAB — COMPREHENSIVE METABOLIC PANEL WITH GFR
ALT: 15 U/L (ref 0–44)
AST: 34 U/L (ref 15–41)
Albumin: 4.1 g/dL (ref 3.5–5.0)
Alkaline Phosphatase: 27 U/L — ABNORMAL LOW (ref 38–126)
Anion gap: 8 (ref 5–15)
BUN: 12 mg/dL (ref 6–20)
CO2: 25 mmol/L (ref 22–32)
Calcium: 9.3 mg/dL (ref 8.9–10.3)
Chloride: 102 mmol/L (ref 98–111)
Creatinine, Ser: 1.18 mg/dL (ref 0.61–1.24)
GFR, Estimated: >60 mL/min
Glucose, Bld: 94 mg/dL (ref 70–99)
Potassium: 4.2 mmol/L (ref 3.5–5.1)
Sodium: 135 mmol/L (ref 135–145)
Total Bilirubin: 1.1 mg/dL (ref 0.3–1.2)
Total Protein: 7.3 g/dL (ref 6.5–8.1)

## 2021-04-25 LAB — BLOOD GAS, ARTERIAL
Acid-Base Excess: 1.2 mmol/L (ref 0.0–2.0)
Bicarbonate: 25.6 mmol/L (ref 20.0–28.0)
Drawn by: 58793
FIO2: 21
O2 Saturation: 98.5 %
Patient temperature: 37
pCO2 arterial: 43 mmHg (ref 32.0–48.0)
pH, Arterial: 7.391 (ref 7.350–7.450)
pO2, Arterial: 116 mmHg — ABNORMAL HIGH (ref 83.0–108.0)

## 2021-04-25 LAB — URINALYSIS, ROUTINE W REFLEX MICROSCOPIC
Bilirubin Urine: NEGATIVE
Glucose, UA: NEGATIVE mg/dL
Hgb urine dipstick: NEGATIVE
Ketones, ur: NEGATIVE mg/dL
Leukocytes,Ua: NEGATIVE
Nitrite: NEGATIVE
Protein, ur: NEGATIVE mg/dL
Specific Gravity, Urine: 1.02 (ref 1.005–1.030)
pH: 6 (ref 5.0–8.0)

## 2021-04-25 LAB — HEMOGLOBIN A1C
Hgb A1c MFr Bld: 4.5 % — ABNORMAL LOW (ref 4.8–5.6)
Mean Plasma Glucose: 82.45 mg/dL

## 2021-04-25 LAB — PROTIME-INR
INR: 1.2 (ref 0.8–1.2)
Prothrombin Time: 14.9 s (ref 11.4–15.2)

## 2021-04-25 LAB — APTT: aPTT: 32 s (ref 24–36)

## 2021-04-25 NOTE — Progress Notes (Signed)
Spoke with Joey at [facility] and made him aware of procedure, time, and date.  He stated he would be here around [place] on 04/27/21.   Also sent staff message to [facility] for device orders, Lindsi, RN cc'd.

## 2021-04-25 NOTE — Progress Notes (Addendum)
Surgical Instructions    Your procedure is scheduled on Thursday December 8th.  Report to [facility] Main Entrance "A" at 5:30 A.M., then check in with the Admitting office.  Call this number if you have problems the morning of surgery:  [phone]   If you have any questions prior to your surgery date call [phone]: Open Monday-Friday 8am-4pm    Remember:  Do not eat or drink after midnight the night before your surgery     Take these medicines the morning of surgery with A SIP OF WATER Instruct patient to take usual cardiac medications (calcium-channel blockers, beta blockers, digoxin, or nitrates) the morning of surgery with a sip of water (no other liquids). carvedilol (COREG) 12.5 MG tablet ENTRESTO 49-51 MG ranolazine (RANEXA) 1000 MG SR tablet  IF NEEDED umeclidinium bromide (INCRUSE ELLIPTA) 62.5 MCG/INH AEPB - bring with you to the hospital   As of today, STOP taking any Aspirin (unless otherwise instructed by your surgeon) Aleve, Naproxen, Ibuprofen, Motrin, Advil, Goody's, BC's, all herbal medications, fish oil, and all vitamins.     After your COVID test   You are not required to quarantine however you are required to wear a well-fitting mask when you are out and around people not in your household.  If your mask becomes wet or soiled, replace with a new one.  Wash your hands often with soap and water for 20 seconds or clean your hands with an alcohol-based hand sanitizer that contains at least 60% alcohol.  Do not share personal items.  Notify your provider: if you are in close contact with someone who has COVID  or if you develop a fever of 100.4 or greater, sneezing, cough, sore throat, shortness of breath or body aches.             Do not wear jewelry  Do not wear lotions, powders, colognes, or deodorant. Do not shave 48 hours prior to surgery.  Men may shave face and neck. Do not bring valuables to the hospital. DO Not wear nail polish, gel  polish, artificial nails, or any other type of covering on natural nails including finger and toenails. If patients have artificial nails, gel coating, etc. that need to be removed by a nail salon, please have this removed prior to surgery or surgery may need to be canceled/delayed if the surgeon/ anesthesia feels like the patient is unable to be adequately monitored.             [facility] is not responsible for any belongings or valuables.  Do NOT Smoke (Tobacco/Vaping)  24 hours prior to your procedure  If you use a CPAP at night, you may bring your mask for your overnight stay.   Contacts, glasses, hearing aids, dentures or partials may not be worn into surgery, please bring cases for these belongings   For patients admitted to the hospital, discharge time will be determined by your treatment team.   Patients discharged the day of surgery will not be allowed to drive home, and someone needs to stay with them for 24 hours.  NO VISITORS WILL BE ALLOWED IN PRE-OP WHERE PATIENTS ARE PREPPED FOR SURGERY.  ONLY 1 SUPPORT PERSON MAY BE PRESENT IN THE WAITING ROOM WHILE YOU ARE IN SURGERY.  IF YOU ARE TO BE ADMITTED, ONCE YOU ARE IN YOUR ROOM YOU WILL BE ALLOWED TWO (2) VISITORS. 1 (ONE) VISITOR MAY STAY OVERNIGHT BUT MUST ARRIVE TO THE ROOM BY 8pm.  Minor children may have two  parents present. Special consideration for safety and communication needs will be reviewed on a case by case basis.  Special instructions:    Oral Hygiene is also important to reduce your risk of infection.  Remember - BRUSH YOUR TEETH THE MORNING OF SURGERY WITH YOUR REGULAR TOOTHPASTE   [facility]- Preparing For Surgery  Before surgery, you can play an important role. Because skin is not sterile, your skin needs to be as free of germs as possible. You can reduce the number of germs on your skin by washing with CHG (chlorahexidine gluconate) Soap before surgery.  CHG is an antiseptic cleaner which kills germs and bonds  with the skin to continue killing germs even after washing.     Please do not use if you have an allergy to CHG or antibacterial soaps. If your skin becomes reddened/irritated stop using the CHG.  Do not shave (including legs and underarms) for at least 48 hours prior to first CHG shower. It is OK to shave your face.  Please follow these instructions carefully.     Shower the NIGHT BEFORE SURGERY and the MORNING OF SURGERY with CHG Soap.   If you chose to wash your hair, wash your hair first as usual with your normal shampoo. After you shampoo, rinse your hair and body thoroughly to remove the shampoo.  Then [employer] and genitals (private parts) with your normal soap and rinse thoroughly to remove soap.  After that Use CHG Soap as you would any other liquid soap. You can apply CHG directly to the skin and wash gently with a scrungie or a clean washcloth.   Apply the CHG Soap to your body ONLY FROM THE NECK DOWN.  Do not use on open wounds or open sores. Avoid contact with your eyes, ears, mouth and genitals (private parts). Wash Face and genitals (private parts)  with your normal soap.   Wash thoroughly, paying special attention to the area where your surgery will be performed.  Thoroughly rinse your body with warm water from the neck down.  DO NOT shower/wash with your normal soap after using and rinsing off the CHG Soap.  Pat yourself dry with a CLEAN TOWEL.  Wear CLEAN PAJAMAS to bed the night before surgery  Place CLEAN SHEETS on your bed the night before your surgery  DO NOT SLEEP WITH PETS.   Day of Surgery:  Take a shower with CHG soap. Wear Clean/Comfortable clothing the morning of surgery Do not apply any deodorants/lotions.   Remember to brush your teeth WITH YOUR REGULAR TOOTHPASTE.   Please read over the following fact sheets that you were given.

## 2021-04-25 NOTE — Progress Notes (Signed)
PERIOPERATIVE PRESCRIPTION FOR IMPLANTED CARDIAC DEVICE PROGRAMMING  Patient Information: Name:  [name]  DOB:  1975/10/30  MRN:  [id number]    Gleason, Ginger E, RN  P Cv Div Heartcare Device Cc: Forte, Lindsi R, RN Planned Procedure:  Redo aortic valve replacement possible ascending aorta replacement  Surgeon:  Cyndia Bent  Date of Procedure:  04/27/21  Cautery will be used.  Position during surgery:  supine   Please send documentation back to:  [facility] (Fax # [phone])   Gleason, Thornell Mule, RN  04/25/2021 11:53 AM  Device Information:  Clinic EP Physician:  Virl Axe, MD   Device Type:  Defibrillator Manufacturer and Phone #:  [employer]: [phone] Pacemaker Dependent?:  No. Date of Last Device Check:  03/24/21(in-clinic) Normal Device Function?:  Yes.    Electrophysiologist's Recommendations:  Have magnet available. Provide continuous ECG monitoring when magnet is used or reprogramming is to be performed.  Procedure will likely interfere with device function.  Device should be programmed:  Tachy therapies disabled  Per Device Clinic Standing Orders, York Ram, RN  12:21 PM 04/25/2021

## 2021-04-25 NOTE — Progress Notes (Signed)
PCP - Delsa Grana, PA with [facility] Cardiologist - Dr. Caryl Comes with [facility]  PPM/ICD - ICD orders requested with [facility] Device Orders -  Rep Notified - Joey will be here on the 8th at [place]  Chest x-ray - 04/25/21 EKG - 04/25/21 Stress Test - Yes "A long time ago" ECHO - 01/20/21 Cardiac Cath -03/01/21   Sleep Study - Denies  DM - Denies  COVID TEST- 04/25/21   Anesthesia review: Yes cardiac history  Patient denies shortness of breath, fever, cough and chest pain at PAT appointment   All instructions explained to the patient, with a verbal understanding of the material. Patient agrees to go over the instructions while at home for a better understanding. Patient also instructed to wear a mask while in public after being tested for COVID-19. The opportunity to ask questions was provided.

## 2021-04-26 LAB — SARS CORONAVIRUS 2 (TAT 6-24 HRS): SARS Coronavirus 2: NEGATIVE

## 2021-04-26 MED ORDER — EPINEPHRINE HCL 5 MG/250ML IV SOLN IN NS
0.0000 ug/min | INTRAVENOUS | Status: DC
  Filled 2021-04-26: qty 250

## 2021-04-26 MED ORDER — MILRINONE LACTATE IN DEXTROSE 20-5 MG/100ML-% IV SOLN
0.3000 ug/kg/min | INTRAVENOUS | Status: DC
  Filled 2021-04-26: qty 100

## 2021-04-26 MED ORDER — NITROGLYCERIN IN D5W 200-5 MCG/ML-% IV SOLN
2.0000 ug/min | INTRAVENOUS | Status: AC
  Administered 2021-04-27: 5 ug/min via INTRAVENOUS
  Filled 2021-04-26: qty 250

## 2021-04-26 MED ORDER — PHENYLEPHRINE HCL-NACL 20-0.9 MG/250ML-% IV SOLN
30.0000 ug/min | INTRAVENOUS | Status: AC
  Administered 2021-04-27: 50 ug/min via INTRAVENOUS
  Filled 2021-04-26: qty 250

## 2021-04-26 MED ORDER — DEXMEDETOMIDINE HCL IN NACL 400 MCG/100ML IV SOLN
0.1000 ug/kg/h | INTRAVENOUS | Status: AC
  Administered 2021-04-27: .5 ug/kg/h via INTRAVENOUS
  Filled 2021-04-26: qty 100

## 2021-04-26 MED ORDER — HEPARIN 30,000 UNITS/1000 ML (OHS) CELLSAVER SOLUTION
Status: DC
  Filled 2021-04-26: qty 1000

## 2021-04-26 MED ORDER — TRANEXAMIC ACID (OHS) BOLUS VIA INFUSION
15.0000 mg/kg | INTRAVENOUS | Status: AC
  Administered 2021-04-27: 1330.5 mg via INTRAVENOUS
  Filled 2021-04-26: qty 1331

## 2021-04-26 MED ORDER — CEFAZOLIN SODIUM-DEXTROSE 2-4 GM/100ML-% IV SOLN
2.0000 g | INTRAVENOUS | Status: AC
  Administered 2021-04-27: 2 g via INTRAVENOUS
  Filled 2021-04-26: qty 100

## 2021-04-26 MED ORDER — INSULIN REGULAR(HUMAN) IN NACL 100-0.9 UT/100ML-% IV SOLN
INTRAVENOUS | Status: AC
  Administered 2021-04-27: 1.1 [IU]/h via INTRAVENOUS
  Filled 2021-04-26: qty 100

## 2021-04-26 MED ORDER — POTASSIUM CHLORIDE 2 MEQ/ML IV SOLN
80.0000 meq | INTRAVENOUS | Status: DC
  Filled 2021-04-26: qty 40

## 2021-04-26 MED ORDER — TRANEXAMIC ACID 1000 MG/10ML IV SOLN
1.5000 mg/kg/h | INTRAVENOUS | Status: AC
  Administered 2021-04-27: 1.5 mg/kg/h via INTRAVENOUS
  Filled 2021-04-26: qty 25

## 2021-04-26 MED ORDER — PLASMA-LYTE A IV SOLN
INTRAVENOUS | Status: DC
  Filled 2021-04-26: qty 5

## 2021-04-26 MED ORDER — MANNITOL 20 % IV SOLN
INTRAVENOUS | Status: DC
  Filled 2021-04-26: qty 13

## 2021-04-26 MED ORDER — NOREPINEPHRINE 4 MG/250ML-% IV SOLN
0.0000 ug/min | INTRAVENOUS | Status: AC
  Administered 2021-04-27: 2 ug/min via INTRAVENOUS
  Filled 2021-04-26: qty 250

## 2021-04-26 MED ORDER — TRANEXAMIC ACID (OHS) PUMP PRIME SOLUTION
2.0000 mg/kg | INTRAVENOUS | Status: DC
  Filled 2021-04-26: qty 1.77

## 2021-04-26 MED ORDER — VANCOMYCIN HCL 1500 MG/300ML IV SOLN
1500.0000 mg | INTRAVENOUS | Status: AC
  Administered 2021-04-27: 1500 mg via INTRAVENOUS
  Filled 2021-04-26: qty 300

## 2021-04-26 NOTE — H&P (Signed)
[address]       [place] [address]             [phone]      Cardiothoracic Surgery Admission History and Physical   PCP is Delsa Grana, PA-C  Referring Provider is Sherren Mocha, MD      Chief Complaint  Patient presents with severe prosthetic aortic valve stenosis          HPI:  The patient is a 45 year old gentleman with a history of hypertension who underwent VSD repair at age 45 [address] [place]). He reports having severe aortic insufficiency at age 35 and underwent bioprosthetic aortic valve replacement ([place]) using a 25 mm Perimount. 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 He said that he was given a choice to have a mechanical or bioprosthetic valve and chose to have a bioprosthetic valve so would not have to be on Coumadin. He was subsequently found to have episodes of ventricular tachycardia in 2008 and underwent implantation of an ICD. He has been followed by Dr. Caryl Comes. He has had a few appropriate shocks for ventricular tachycardia over the years as well as ATP therapy with his most recent shock being yesterday. He said that this does not happen very often. He was evaluated at [facility] for a VT ablation but was found to have an epicardial source and it was not performed.  His echocardiograms have shown stable left ventricular systolic dysfunction with ejection fraction of 35 to 40%. In 2020 the mean gradient across the prosthetic valve was 43 mmHg. In 2021 the gradient was 33 mmHg. His most recent echo in September 2022 showed a mean gradient of 42 mmHg. There is no mitral regurgitation or stenosis. Left ventricular ejection fraction is 35 to 40% with global hypokinesis. Left ventricular cavity is moderately dilated at 6.2 cm in diastole and 5.1 cm in systole. Stroke-volume index is 36. He subsequently underwent cardiac catheterization on 03/01/2021. This showed single-vessel coronary disease  with chronic total occlusion of the RCA with faint collaterals from the left. There is no other obstructive disease. The mean gradient across the prosthetic aortic valve was 57 mmHg with a peak to peak gradient of 74 mmHg. Valve area was calculated at 0.7 cm. Right heart pressures were normal.  He reports generalized fatigue and shortness of breath with exertion. He denies any chest pain or pressure. He has had some dizziness. He denies peripheral edema.      Past Medical History:  Diagnosis Date   Anxiety    Aortic valve replaced    Requiring replacement, specifics not available   Hearing loss of both ears 12/22/2015   Heart murmur    Hypertension    IBD (inflammatory bowel disease) 09/23/2015   ICD (implantable cardiac defibrillator), BSX single    Obesity 12/22/2015   Secondary cardiomyopathy ([place])    Syncope    Ventricular septal defect    Ventricular tachycardia    appropriate VT shock therapy /13        Past Surgical History:  Procedure Laterality Date   ANGIOPLASTY     RCA repair with vein angioplasty following injury with the aforementioned surgery   AORTIC VALVE REPLACEMENT     Bioprosthesis   CARDIAC DEFIBRILLATOR PLACEMENT     CARDIAC VALVE REPLACEMENT     CHOLECYSTECTOMY     COLONOSCOPY WITH PROPOFOL N/A 12/06/2015   Procedure: COLONOSCOPY WITH PROPOFOL; Surgeon: [name],  MD; Location: [facility] ENDOSCOPY; Service: Endoscopy; Laterality: N/A;   CORONARY ARTERY BYPASS GRAFT     IMPLANTABLE CARDIOVERTER DEFIBRILLATOR GENERATOR CHANGE N/A 05/05/2013   Procedure: IMPLANTABLE CARDIOVERTER DEFIBRILLATOR GENERATOR CHANGE; Surgeon: Deboraha Sprang, MD; Location: [facility] CATH LAB; Service: Cardiovascular; Laterality: N/A;   INSERT / REPLACE / REMOVE PACEMAKER     RIGHT/LEFT HEART CATH AND CORONARY ANGIOGRAPHY N/A 03/01/2021   Procedure: RIGHT/LEFT HEART CATH AND CORONARY ANGIOGRAPHY; Surgeon: Sherren Mocha, MD; Location: [place] CV LAB; Service: Cardiovascular; Laterality: N/A;    VSD REPAIR          Family History  Problem Relation Age of Onset   Heart disease Father    Hypertension Maternal Grandmother    Cancer Maternal Grandmother    Hypertension Maternal Grandfather    Cancer Maternal Grandfather    Social History  Social History       Tobacco Use   Smoking status: Never   Smokeless tobacco: Never  Vaping Use   Vaping Use: Never used  Substance Use Topics   Alcohol use: No   Drug use: No         Current Outpatient Medications  Medication Sig Dispense Refill   acetaminophen (TYLENOL) 500 MG tablet Take 500 mg by mouth every 6 (six) hours as needed for moderate pain.     carvedilol (COREG) 12.5 MG tablet TAKE 1 TABLET TWICE A DAY 180 tablet 3   ENTRESTO 49-51 MG TAKE 1 TABLET TWICE A DAY 180 tablet 3   fluticasone (FLONASE) 50 MCG/ACT nasal spray Place 2 sprays into both nostrils daily. (Patient taking differently: Place 2 sprays into both nostrils daily as needed for allergies.) 16 g 2   loratadine (CLARITIN) 5 MG chewable tablet Chew 1 tablet (5 mg total) by mouth daily as needed for allergies. 30 tablet 0   magnesium oxide (MAG-OX) 400 (241.3 Mg) MG tablet Take 1 tablet (400 mg total) by mouth 2 (two) times daily. 60 tablet 0   meclizine (ANTIVERT) 25 MG tablet Take 0.5-1 tablets (12.5-25 mg total) by mouth 3 (three) times daily as needed for dizziness. 30 tablet 2   Multiple Vitamin (MULTIVITAMIN) tablet Take 2 tablets by mouth daily.     ranolazine (RANEXA) 1000 MG SR tablet TAKE 1 TABLET TWICE A DAY 180 tablet 3   spironolactone (ALDACTONE) 25 MG tablet TAKE ONE-HALF (1/2) TABLET DAILY 45 tablet 3   triamcinolone cream (KENALOG) 0.1 % Apply 1 application topically 2 (two) times daily as needed. 30 g 0   umeclidinium bromide (INCRUSE ELLIPTA) 62.5 MCG/INH AEPB Inhale 1 puff into the lungs daily. (Patient taking differently: Inhale 1 puff into the lungs daily as needed (shortness of breath).) 30 each 5   No current facility-administered  medications for this visit.       Allergies  Allergen Reactions   Shellfish Allergy Nausea And Vomiting   Review of Systems  Constitutional: Positive for fatigue.  HENT: Positive for hearing loss.  Sees his dentist regularly  Eyes: Negative.  Respiratory: Positive for shortness of breath.  Cardiovascular: Negative for chest pain, palpitations and leg swelling.  Gastrointestinal: Negative.  Endocrine: Negative.  Genitourinary: Negative.  Musculoskeletal: Negative.  Allergic/Immunologic: Negative.  Neurological: Positive for dizziness. Negative for syncope.  Hematological: Negative.  Psychiatric/Behavioral: Negative.  BP 106/70 (BP Location: Left Arm, Patient Position: Sitting, Cuff Size: Normal)  Pulse 75  Resp 20  Ht 5\' 10"  (1.778 m)  Wt 195 lb 6.4 oz (88.6 kg)  SpO2 98% Comment: RA  BMI 28.04 kg/m  Physical Exam  Constitutional:  Appearance: Normal appearance. He is normal weight.  HENT:  Head: Normocephalic and atraumatic.  Eyes:  Extraocular Movements: Extraocular movements intact.  Conjunctiva/sclera: Conjunctivae normal.  Pupils: Pupils are equal, round, and reactive to light.  Cardiovascular:  Rate and Rhythm: Normal rate and regular rhythm.  Pulses: Normal pulses.  Heart sounds: Murmur heard.  Comments: 3/6 systolic murmur along the right sternal border.  Pulmonary:  Effort: Pulmonary effort is normal.  Breath sounds: Normal breath sounds.  Abdominal:  General: Bowel sounds are normal. There is no distension.  Tenderness: There is no abdominal tenderness.  Musculoskeletal:  General: No swelling. Normal range of motion.  Cervical back: Normal range of motion and neck supple.  Skin:  General: Skin is warm and dry.  Neurological:  General: No focal deficit present.  Mental Status: He is alert and oriented to person, place, and time.  Psychiatric:  Mood and Affect: Mood normal.  Behavior: Behavior normal.   Diagnostic Tests:  ECHOCARDIOGRAM REPORT      Patient Name: ROHAIL KLEES Date of Exam: 01/20/2021  Medical Rec #: [id number] Height: 70.0 in  Accession #: [id number] Weight: 192.0 lb  Date of Birth: 09-02-1975 BSA: 2.051 m  Patient Age: 61 years BP: 120/80 mmHg  Patient Gender: M HR: 72 bpm.  Exam Location: [place]   Procedure: 2D Echo, Cardiac Doppler and Color Doppler   Indications: T82.09XA Prosthetic valve dysfunction   History: Patient has prior history of Echocardiogram examinations,  most  recent 02/02/2020. Cardiomyopathy, ICD, S/p AVR,  Arrythmias:PVC;  Risk Factors:Hypertension.  Aortic Valve: bioprosthetic valve is present in the aortic  position. Procedure Date: 2005.   Sonographer: Coralyn Helling RDCS  Referring Phys: [place]    1. Bioprosthetic aortic valve degenerative stenosis.  2. Left ventricular ejection fraction, by estimation, is 35 to 40%. The  left ventricle has moderately decreased function. The left ventricle  demonstrates global hypokinesis. The left ventricular internal cavity size  was mildly dilated. There is mild  left ventricular hypertrophy. Left ventricular diastolic parameters are  indeterminate.  3. Right ventricular systolic function is normal. The right ventricular  size is normal.  4. The mitral valve is normal in structure. No evidence of mitral valve  regurgitation. No evidence of mitral stenosis.  5. The aortic valve has been repaired/replaced. Aortic valve  regurgitation is mild. Severe aortic valve stenosis. There is a  bioprosthetic valve present in the aortic position. Procedure Date: 2005.  Aortic valve area, by VTI measures 0.69 cm. Aortic  valve mean gradient measures 39.3 mmHg. Aortic valve Vmax measures 3.93  m/s.  6. The inferior vena cava is normal in size with greater than 50%  respiratory variability, suggesting right atrial pressure of 3 mmHg.   Comparison(s): Prior aortic valve mean gradient was 33 mmHg, with Vmax of   3.88 m/s.   FINDINGS  Left Ventricle: Left ventricular ejection fraction, by estimation, is 35  to 40%. The left ventricle has moderately decreased function. The left  ventricle demonstrates global hypokinesis. The left ventricular internal  cavity size was mildly dilated.  There is mild left ventricular hypertrophy. Left ventricular diastolic  parameters are indeterminate.    LV Wall Scoring:  The inferior wall is akinetic.   Right Ventricle: The right ventricular size is normal. No increase in  right ventricular wall thickness. Right ventricular systolic function is  normal.   Left Atrium: Left atrial size was normal  in size.   Right Atrium: Right atrial size was normal in size.   Pericardium: There is no evidence of pericardial effusion.   Mitral Valve: The mitral valve is normal in structure. No evidence of  mitral valve regurgitation. No evidence of mitral valve stenosis.   Tricuspid Valve: The tricuspid valve is normal in structure. Tricuspid  valve regurgitation is not demonstrated. No evidence of tricuspid  stenosis.   Aortic Valve: The aortic valve has been repaired/replaced. Aortic valve  regurgitation is mild. Aortic regurgitation PHT measures 575 msec. Severe  aortic stenosis is present. Aortic valve mean gradient measures 39.3 mmHg.  Aortic valve peak gradient  measures 61.9 mmHg. Aortic valve area, by VTI measures 0.69 cm. There is  a bioprosthetic valve present in the aortic position. Procedure Date:  2005.   Pulmonic Valve: The pulmonic valve was normal in structure. Pulmonic valve  regurgitation is not visualized. No evidence of pulmonic stenosis.   Aorta: The aortic root is normal in size and structure.   Venous: The inferior vena cava is normal in size with greater than 50%  respiratory variability, suggesting right atrial pressure of 3 mmHg.   IAS/Shunts: No atrial level shunt detected by color flow Doppler.   Additional Comments: Bioprosthetic  aortic valve degenerative stenosis. A  device lead is visualized.    LEFT VENTRICLE  PLAX 2D  LVIDd: 6.20 cm Diastology  LVIDs: 5.10 cm LV e' medial: 6.74 cm/s  LV PW: 1.00 cm LV E/e' medial: 9.8  LV IVS: 1.30 cm LV e' lateral: 8.59 cm/s  LVOT diam: 2.90 cm LV E/e' lateral: 7.7  LV SV: 73  LV SV Index: 36  LVOT Area: 6.61 cm    RIGHT VENTRICLE IVC  RV S prime: 8.38 cm/s IVC diam: 1.10 cm  TAPSE (M-mode): 1.4 cm   LEFT ATRIUM Index RIGHT ATRIUM Index  LA diam: 4.10 cm 2.00 cm/m RA Pressure: 3.00 mmHg  LA Vol (A2C): 74.0 ml 36.07 ml/m RA Area: 15.30 cm  RA Volume: 41.50 ml 20.23 ml/m  AORTIC VALVE  AV Area (Vmax): 0.75 cm  AV Area (Vmean): 0.80 cm  AV Area (VTI): 0.69 cm  AV Vmax: 393.33 cm/s  AV Vmean: 297.667 cm/s  AV VTI: 1.061 m  AV Peak Grad: 61.9 mmHg  AV Mean Grad: 39.3 mmHg  LVOT Vmax: 44.80 cm/s  LVOT Vmean: 36.000 cm/s  LVOT VTI: 0.111 m  LVOT/AV VTI ratio: 0.10  AI PHT: 575 msec   AORTA  Ao Root diam: 3.70 cm  Ao Asc diam: 3.10 cm   MITRAL VALVE TRICUSPID VALVE  MV Area (PHT): 3.87 cm Estimated RAP: 3.00 mmHg  MV Decel Time: 196 msec  MV E velocity: 66.00 cm/s SHUNTS  MV A velocity: 53.60 cm/s Systemic VTI: 0.11 m  MV E/A ratio: 1.23 Systemic Diam: 2.90 cm   Candee Furbish MD  Electronically signed by Candee Furbish MD  Signature Date/Time: 01/20/2021/11:19:13 AM     Final  Physicians  Panel Physicians Referring Physician Case Authorizing Physician  Sherren Mocha, MD (Primary)    Procedures  RIGHT/LEFT HEART CATH AND CORONARY ANGIOGRAPHY  Conclusion   Ost RCA to Prox RCA lesion is 100% stenosed.   There is severe aortic valve stenosis.  1. Single-vessel coronary artery disease with total occlusion of the RCA, collateralized from the left coronary artery (this is known from the patient's history) 2. Patent left main, LAD, and left circumflex with no obstructive coronary artery disease in those vessels. The RCA is collateralized  from septal  perforators of the LAD 3. Severe bioprosthetic aortic valve stenosis with mean gradient 57 mmHg, peak to peak gradient 74 mmHg, calculated aortic valve area 0.7 cm 4. Normal right heart hemodynamics and preserved cardiac output  Recommendations: Cardiac surgical referral for consideration of redo aortic valve replacement  Procedural Details  Technical Details INDICATION: Severe bioprosthetic aortic valve stenosis  PROCEDURAL DETAILS: There was an indwelling IV in a right antecubital vein. Using normal sterile technique, the IV was changed out for a 5 Fr brachial sheath over a 0.018 inch wire. The right wrist was then prepped, draped, and anesthetized with 1% lidocaine. Using direct ultrasound guidance 5/6 French Slender sheath was placed in the right radial artery. Ultrasound images are captured and digitally charted. Intra-arterial verapamil was administered through the radial artery sheath. IV heparin was administered after a JR4 catheter was advanced into the central aorta. A Swan-Ganz catheter was used for the right heart catheterization. Standard protocol was followed for recording of right heart pressures and sampling of oxygen saturations. Fick cardiac output was calculated. Standard Judkins catheters were used for selective coronary angiography. LV pressure is recorded and an aortic valve pullback is performed. The aortic valve is crossed with a multipurpose catheter and a straight tip wire. Aortic valve pullback is performed with a multipurpose catheter. There were no immediate procedural complications. The patient was transferred to the post catheterization recovery area for further monitoring.     Estimated blood loss <50 mL.   During this procedure medications were administered to achieve and maintain moderate conscious sedation while the patient's heart rate, blood pressure, and oxygen saturation were continuously monitored and I was present face-to-face 100% of this time.  Medications   (Filter: Administrations occurring from [phone] to 0932 on 03/01/21)  important Continuous medications are totaled by the amount administered until 03/01/21 0932.  Heparin (Porcine) in NaCl 1000-0.9 UT/500ML-% SOLN (mL)  Total volume: 1,000 mL  Date/Time Rate/Dose/Volume Action   03/01/21 0847 500 mL Given   0847 500 mL Given   midazolam (VERSED) injection (mg)  Total dose: 2 mg  Date/Time Rate/Dose/Volume Action   03/01/21 0855 2 mg Given   fentaNYL (SUBLIMAZE) injection (mcg)  Total dose: 50 mcg  Date/Time Rate/Dose/Volume Action   03/01/21 0855 50 mcg Given   lidocaine (PF) (XYLOCAINE) 1 % injection (mL)  Total volume: 4 mL  Date/Time Rate/Dose/Volume Action   03/01/21 0857 2 mL Given   0858 2 mL Canceled Entry   0904 2 mL Given   Radial Cocktail/Verapamil only (mL)  Total volume: 10 mL  Date/Time Rate/Dose/Volume Action   03/01/21 0907 10 mL Given   heparin sodium (porcine) injection (Units)  Total dose: 5,000 Units  Date/Time Rate/Dose/Volume Action   03/01/21 0908 5,000 Units Given   iohexol (OMNIPAQUE) 350 MG/ML injection (mL)  Total volume: 60 mL  Date/Time Rate/Dose/Volume Action   03/01/21 0928 60 mL Given   Sedation Time  Sedation Time Physician-1: 31 minutes 35 seconds  Contrast  Medication Name Total Dose  iohexol (OMNIPAQUE) 350 MG/ML injection 60 mL  Radiation/Fluoro  Fluoro time: 7.8 (min)  DAP: 44818 (mGycm2)  Cumulative Air Kerma: 281 (mGy)  Coronary Findings  Diagnostic  Dominance: Right  Left Main  Vessel is angiographically normal.  Left Circumflex  Vessel is angiographically normal.  Right Coronary Artery  Ost RCA to Prox RCA lesion is 100% stenosed.  Right Posterior Descending Artery  Collaterals  RPDA filled by collaterals from 2nd Sept.    Collaterals  RPDA filled by collaterals from 1st Sept.    Intervention  No interventions have been documented.  Left Heart  Aortic Valve There is severe aortic valve stenosis. The patient has a  bioprosthetic aortic valve .  Coronary Diagrams  Diagnostic  Dominance: Right  Intervention  Implants  No implant documentation for this case.   Syngo Images  Show images for CARDIAC CATHETERIZATION  Images on Long Term Storage  Show images for Alvin Critchley to Procedure Log    Procedure Log  Hemo Data  Flowsheet Row Most Recent Value  Fick Cardiac Output 6.04 L/min  Fick Cardiac Output Index 2.93 (L/min)/BSA  Aortic Mean Gradient 56.9 mmHg  Aortic Peak Gradient 74 mmHg  Aortic Valve Area 0.70  Aortic Value Area Index 0.34 cm2/BSA  RA A Wave 7 mmHg  RA V Wave 6 mmHg  RA Mean 4 mmHg  RV Systolic Pressure 30 mmHg  RV Diastolic Pressure 0 mmHg  RV EDP 7 mmHg  PA Systolic Pressure 28 mmHg  PA Diastolic Pressure 9 mmHg  PA Mean 17 mmHg  PW A Wave 17 mmHg  PW V Wave 14 mmHg  PW Mean 10 mmHg  AO Systolic Pressure 95 mmHg  AO Diastolic Pressure 60 mmHg  AO Mean 76 mmHg  LV Systolic Pressure 811 mmHg  LV Diastolic Pressure 7 mmHg  LV EDP 15 mmHg  AOp Systolic Pressure 86 mmHg  AOp Diastolic Pressure 61 mmHg  AOp Mean Pressure 72 mmHg  LVp Systolic Pressure 914 mmHg  LVp Diastolic Pressure 8 mmHg  LVp EDP Pressure 17 mmHg  QP/QS 0.82  TPVR Index 5.8 HRUI  TSVR Index 25.93 HRUI  PVR SVR Ratio 0.1  TPVR/TSVR Ratio 0.22   ADDENDUM REPORT: 03/19/2021 17:32  CLINICAL DATA: Prosthetic valve stenosis. 25 mm pericardial tissue  valve 07/09/2001 at [facility]).  EXAM:  Cardiac TAVR CT  TECHNIQUE:  A non-contrast, gated CT scan was obtained with axial slices of 3 mm  through the heart for aortic valve calcium scoring. A 100 kV  retrospective, gated, contrast cardiac scan was obtained. Gantry  rotation speed was 250 msecs and collimation was 0.6 mm.  Nitroglycerin was not given. The 3D data set was reconstructed in 5%  intervals of the 0-95% of the R-R cycle. Systolic and diastolic  phases were analyzed on a dedicated workstation using MPR, MIP, and  VRT  modes. The patient received 100 cc of contrast.  FINDINGS:  Image quality: Excellent.  Noise artifact is: Limited.  Aortic Valve: A 25 mm pericardial tissue valve is present in the  aortic position. Implant date 07/09/2001 ([place]). The  valve is likely a Perimount valve. The valve leaflets are thickened  and calcified consistent with structural valve deterioration. No  thrombus is present.  Valve in Valve Analysis: ViV simulation performed for a 26 mm S3  TAVR performed with 80% offset into the aorta and 20% into the  ventricle. The virtual transcatheter heart valve (THV) to coronary  (VTC) distance for the left main is 5.6 mm. The ostium of the left  main appears above the THV. The RCA is occluded at the ostium as  noted in the prior operative report. The ostium is well above the  THV regardless. Overall, there is no risk for coronary obstruction  to the left main coronary artery.  Optimal coplanar projection: LAO 5 CRA 2  Sinus of Valsalva Measurements:  Non-coronary: 37 mm  Right-coronary: 36 mm  Left-coronary:  36 mm  Sinotubular Junction: 30 mm  Ascending Thoracic Aorta: 31 mm  Coronary Arteries: Normal coronary origin. Right dominance. Occluded  RCA at the ostium. The left main, LAD, and LCX are normal.  Cardiac Morphology:  Right Atrium: Right atrial size is within normal limits.  Right Ventricle: The right ventricular cavity is within normal  limits. A single CIED lead is seen that terminates in the RV.  Left Atrium: Left atrial size is normal in size with no left atrial  appendage filling defect.  Left Ventricle: The ventricular cavity size is severely dilated.  Moderately reduced LV function, EF=32%. The basal to mid infero  septum and inferior segments are akinetic. The myocardium in this  region is thinned consistent with prior RCA infarction.  Pulmonary arteries: Normal in size without proximal filling defect.  Pulmonary veins: Normal pulmonary venous  drainage.  Pericardium: Normal thickness with no significant effusion or  calcium present.  Mitral Valve: The mitral valve is normal structure without  significant calcification.  Extra-cardiac findings: See attached radiology report for  non-cardiac structures.  IMPRESSION:  1. 25 mm pericardial bioprosthesis (likely a Perimount prosthesis)  in the aortic position with calcified and thickened leaflets  consistent with structural valve deterioration.  2. Valve in valve analysis appropriate for 26 mm S3 TAVR. No risk of  coronary obstruction.  3. Optimal Fluoroscopic Angle for Delivery: LAO 5 CRA 2  4. Severely dilated left ventricular cavity. Moderately reduced LV  function, EF=32%. Regional wall motion abnormalities are consistent  with prior RCA infarction.  [place] T. Audie Box, MD  Electronically Signed  By: Eleonore Chiquito M.D.  On: 03/19/2021 17:32      Narrative & Impression  CLINICAL DATA: Aortic stenosis, preop evaluation  EXAM:  CT ANGIOGRAPHY CHEST, ABDOMEN AND PELVIS  TECHNIQUE:  Multidetector CT imaging through the chest, abdomen and pelvis was  performed using the standard protocol during bolus administration of  intravenous contrast. Multiplanar reconstructed images and MIPs were  obtained and reviewed to evaluate the vascular anatomy.  CONTRAST: 154mL OMNIPAQUE IOHEXOL 350 MG/ML SOLN  COMPARISON: 12/31/2016 and previous  FINDINGS:  CTA CHEST FINDINGS  Cardiovascular: Left subclavian single lead AICD extends to the RV  apex. There is mild primary left-sided cardiomegaly. No pericardial  effusion. Satisfactory opacification of pulmonary arteries noted,  and there is no evidence of pulmonary emboli. Left ventricular  dilatation. Previous AVR. Good contrast opacification of the  thoracic aorta. No dissection, aneurysm or stenosis. Bovine variant  brachiocephalic arterial origin anatomy without proximal stenosis.  Mediastinum/Nodes: No mass or adenopathy.   Lungs/Pleura: No pleural effusion. Stable linear scarring or  subsegmental atelectasis laterally in the right middle lobe. Lungs  otherwise clear. No pneumothorax.  Musculoskeletal: Sternotomy wires. No fracture or worrisome bone  lesion.  Review of the MIP images confirms the above findings.  CTA ABDOMEN AND PELVIS FINDINGS  VASCULAR  Aorta: Normal caliber aorta without aneurysm, dissection, vasculitis  or significant stenosis.  Celiac: Patent without evidence of aneurysm, dissection, vasculitis  or significant stenosis.  SMA: Patent without evidence of aneurysm, dissection, vasculitis or  significant stenosis.  Renals: Both renal arteries are patent without evidence of aneurysm,  dissection, vasculitis, fibromuscular dysplasia or significant  stenosis.  IMA: Patent without evidence of aneurysm, dissection, vasculitis or  significant stenosis.  Inflow: Mild calcified plaque in the common iliac arteries. No  aneurysm, dissection, stenosis, or significant tortuosity.  Veins: No obvious venous abnormality within the limitations of this  arterial phase study.  Review  of the MIP images confirms the above findings.  NON-VASCULAR  Hepatobiliary: No focal liver abnormality is seen. Status post  cholecystectomy. No biliary dilatation.  Pancreas: Unremarkable. No pancreatic ductal dilatation or  surrounding inflammatory changes.  Spleen: Normal in size without focal abnormality.  Adrenals/Urinary Tract: 1.8 cm right upper pole renal cyst, present  since 09/23/2015. No urolithiasis or hydronephrosis. Adrenal glands  unremarkable. Urinary bladder incompletely distended, unremarkable.  Stomach/Bowel: Small hiatal hernia. Stomach is incompletely  distended, unremarkable. The small bowel is decompressed. Normal  appendix. The colon is nondilated , unremarkable.  Lymphatic: No abdominal or pelvic adenopathy.  Reproductive: Prostate enlargement  Other: No ascites. No free air.   Musculoskeletal: No acute or significant osseous findings.  Review of the MIP images confirms the above findings.  IMPRESSION:  1. No aortic aneurysm, dissection, or stenosis.  2. Mild atheromatous plaque in common iliac arteries without  significant stenosis.  Electronically Signed  By: Lucrezia Europe M.D.  On: 03/17/2021 16:44     Impression:   This 45 year old gentleman has severe prosthetic aortic valve stenosis with moderate left ventricular systolic dysfunction with an ejection fraction of 35 to 40% and moderate left ventricular dilatation. Review of his prior echocardiograms dating back to 2013 showed an ejection fraction of 40 to 45% with an LV diastolic diameter of 5.9 cm. I suspect his left ventricular dysfunction is probably related to his history of valvular heart disease as well as perioperative myocardial infarction from RCA occlusion. I think the best treatment for him is redo aortic valve replacement using a mechanical valve. He has no contraindication to anticoagulation. I discussed valve in valve TAVR which I would not recommend due to his relatively young age and the risk of structural valve deterioration requiring further intervention. I discussed the pros and cons of mechanical and bioprosthetic valves and the high risk of structural valve deterioration with a bioprosthetic valve at his young age and he understands and agrees to proceed with a mechanical valve. Review of his CTA of the chest shows some calcification of the ascending aorta extending from the sinotubular junction up to the takeoff of the innominate artery. This appears fairly localized along the right lateral surface of the aorta in a narrow band. It may be necessary to replace the ascending aorta to allow crossclamping but that decision will need to be made in the operating room. I reviewed the CTA images with him and explained that possibility. 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   I discussed the operative procedure with the patient including alternatives, benefits and risks; including but not limited to bleeding, blood transfusion, infection, stroke, myocardial infarction, heart block requiring a permanent pacemaker, organ dysfunction, and death. [name] understands and agrees to proceed.   Plan:   Redo aortic valve replacement using a mechanical valve and possible replacement of the ascending aorta under circulatory arrest on 04/27/2021.   Gaye Pollack, MD  [facility]  [phone]

## 2021-04-27 ENCOUNTER — Inpatient Hospital Stay (HOSPITAL_COMMUNITY): Payer: 59 | Admitting: Anesthesiology

## 2021-04-27 ENCOUNTER — Inpatient Hospital Stay (HOSPITAL_COMMUNITY): Payer: 59

## 2021-04-27 ENCOUNTER — Encounter (HOSPITAL_COMMUNITY): Payer: Self-pay | Admitting: Surgery

## 2021-04-27 ENCOUNTER — Inpatient Hospital Stay (HOSPITAL_COMMUNITY)
Admission: RE | Admit: 2021-04-27 | Discharge: 2021-05-03 | DRG: 220 | Disposition: A | Discharge status: Home / Self Care | Payer: 59 | Attending: Surgery | Admitting: Surgery

## 2021-04-27 ENCOUNTER — Other Ambulatory Visit: Payer: Self-pay

## 2021-04-27 ENCOUNTER — Encounter (HOSPITAL_COMMUNITY)
Admission: RE | Disposition: A | Discharge status: Home / Self Care | Payer: Self-pay | Source: Home / Self Care | Attending: Surgery

## 2021-04-27 DIAGNOSIS — F419 Anxiety disorder, unspecified: Secondary | ICD-10-CM | POA: Diagnosis present

## 2021-04-27 DIAGNOSIS — I493 Ventricular premature depolarization: Secondary | ICD-10-CM | POA: Diagnosis present

## 2021-04-27 DIAGNOSIS — N400 Enlarged prostate without lower urinary tract symptoms: Secondary | ICD-10-CM | POA: Diagnosis present

## 2021-04-27 DIAGNOSIS — Z7951 Long term (current) use of inhaled steroids: Secondary | ICD-10-CM

## 2021-04-27 DIAGNOSIS — J9811 Atelectasis: Secondary | ICD-10-CM | POA: Diagnosis not present

## 2021-04-27 DIAGNOSIS — I454 Nonspecific intraventricular block: Secondary | ICD-10-CM | POA: Diagnosis not present

## 2021-04-27 DIAGNOSIS — Z95810 Presence of automatic (implantable) cardiac defibrillator: Secondary | ICD-10-CM | POA: Diagnosis not present

## 2021-04-27 DIAGNOSIS — I119 Hypertensive heart disease without heart failure: Secondary | ICD-10-CM | POA: Diagnosis not present

## 2021-04-27 DIAGNOSIS — I472 Ventricular tachycardia, unspecified: Secondary | ICD-10-CM | POA: Diagnosis not present

## 2021-04-27 DIAGNOSIS — Z20822 Contact with and (suspected) exposure to covid-19: Secondary | ICD-10-CM | POA: Diagnosis present

## 2021-04-27 DIAGNOSIS — D696 Thrombocytopenia, unspecified: Secondary | ICD-10-CM | POA: Diagnosis present

## 2021-04-27 DIAGNOSIS — I2510 Atherosclerotic heart disease of native coronary artery without angina pectoris: Secondary | ICD-10-CM | POA: Diagnosis present

## 2021-04-27 DIAGNOSIS — Z6830 Body mass index (BMI) 30.0-30.9, adult: Secondary | ICD-10-CM | POA: Diagnosis not present

## 2021-04-27 DIAGNOSIS — D689 Coagulation defect, unspecified: Secondary | ICD-10-CM | POA: Diagnosis not present

## 2021-04-27 DIAGNOSIS — H9193 Unspecified hearing loss, bilateral: Secondary | ICD-10-CM | POA: Diagnosis present

## 2021-04-27 DIAGNOSIS — Y831 Surgical operation with implant of artificial internal device as the cause of abnormal reaction of the patient, or of later complication, without mention of misadventure at the time of the procedure: Secondary | ICD-10-CM | POA: Diagnosis present

## 2021-04-27 DIAGNOSIS — E669 Obesity, unspecified: Secondary | ICD-10-CM | POA: Diagnosis not present

## 2021-04-27 DIAGNOSIS — R42 Dizziness and giddiness: Secondary | ICD-10-CM | POA: Diagnosis present

## 2021-04-27 DIAGNOSIS — R197 Diarrhea, unspecified: Secondary | ICD-10-CM | POA: Diagnosis not present

## 2021-04-27 DIAGNOSIS — D62 Acute posthemorrhagic anemia: Secondary | ICD-10-CM | POA: Diagnosis not present

## 2021-04-27 DIAGNOSIS — E8770 Fluid overload, unspecified: Secondary | ICD-10-CM | POA: Diagnosis not present

## 2021-04-27 DIAGNOSIS — I5022 Chronic systolic (congestive) heart failure: Secondary | ICD-10-CM

## 2021-04-27 DIAGNOSIS — I350 Nonrheumatic aortic (valve) stenosis: Secondary | ICD-10-CM

## 2021-04-27 DIAGNOSIS — I352 Nonrheumatic aortic (valve) stenosis with insufficiency: Secondary | ICD-10-CM | POA: Diagnosis not present

## 2021-04-27 DIAGNOSIS — Z006 Encounter for examination for normal comparison and control in clinical research program: Secondary | ICD-10-CM | POA: Diagnosis not present

## 2021-04-27 DIAGNOSIS — Z953 Presence of xenogenic heart valve: Secondary | ICD-10-CM | POA: Diagnosis not present

## 2021-04-27 DIAGNOSIS — Q210 Ventricular septal defect: Secondary | ICD-10-CM

## 2021-04-27 DIAGNOSIS — I429 Cardiomyopathy, unspecified: Secondary | ICD-10-CM | POA: Diagnosis present

## 2021-04-27 DIAGNOSIS — Z8249 Family history of ischemic heart disease and other diseases of the circulatory system: Secondary | ICD-10-CM | POA: Diagnosis not present

## 2021-04-27 DIAGNOSIS — T829XXA Unspecified complication of cardiac and vascular prosthetic device, implant and graft, initial encounter: Secondary | ICD-10-CM | POA: Diagnosis present

## 2021-04-27 DIAGNOSIS — Z79899 Other long term (current) drug therapy: Secondary | ICD-10-CM

## 2021-04-27 DIAGNOSIS — Z952 Presence of prosthetic heart valve: Secondary | ICD-10-CM

## 2021-04-27 DIAGNOSIS — Z951 Presence of aortocoronary bypass graft: Secondary | ICD-10-CM

## 2021-04-27 DIAGNOSIS — J90 Pleural effusion, not elsewhere classified: Secondary | ICD-10-CM

## 2021-04-27 DIAGNOSIS — T82857A Stenosis of cardiac prosthetic devices, implants and grafts, initial encounter: Secondary | ICD-10-CM | POA: Diagnosis not present

## 2021-04-27 DIAGNOSIS — I252 Old myocardial infarction: Secondary | ICD-10-CM | POA: Diagnosis not present

## 2021-04-27 DIAGNOSIS — Z09 Encounter for follow-up examination after completed treatment for conditions other than malignant neoplasm: Secondary | ICD-10-CM

## 2021-04-27 DIAGNOSIS — Z91013 Allergy to seafood: Secondary | ICD-10-CM

## 2021-04-27 HISTORY — PX: AORTIC VALVE REPLACEMENT: SHX41

## 2021-04-27 HISTORY — PX: TEE WITHOUT CARDIOVERSION: SHX5443

## 2021-04-27 LAB — CBC
HCT: 24.8 % — ABNORMAL LOW (ref 39.0–52.0)
HCT: 20.3 % — ABNORMAL LOW (ref 39.0–52.0)
Hemoglobin: 8.6 g/dL — ABNORMAL LOW (ref 13.0–17.0)
Hemoglobin: 7 g/dL — ABNORMAL LOW (ref 13.0–17.0)
MCH: 30.4 pg (ref 26.0–34.0)
MCH: 30.6 pg (ref 26.0–34.0)
MCHC: 34.7 g/dL (ref 30.0–36.0)
MCHC: 34.5 g/dL (ref 30.0–36.0)
MCV: 87.6 fL (ref 80.0–100.0)
MCV: 88.6 fL (ref 80.0–100.0)
Platelets: 76 10*3/uL — ABNORMAL LOW (ref 150–400)
Platelets: 64 10*3/uL — ABNORMAL LOW (ref 150–400)
RBC: 2.83 MIL/uL — ABNORMAL LOW (ref 4.22–5.81)
RBC: 2.29 MIL/uL — ABNORMAL LOW (ref 4.22–5.81)
RDW: 11.1 % — ABNORMAL LOW (ref 11.5–15.5)
RDW: 11.4 % — ABNORMAL LOW (ref 11.5–15.5)
WBC: 10.2 10*3/uL (ref 4.0–10.5)
WBC: 7.6 10*3/uL (ref 4.0–10.5)
nRBC: 0 % (ref 0.0–0.2)
nRBC: 0 % (ref 0.0–0.2)

## 2021-04-27 LAB — POCT I-STAT, CHEM 8
BUN: 18 mg/dL (ref 6–20)
BUN: 15 mg/dL (ref 6–20)
BUN: 13 mg/dL (ref 6–20)
BUN: 13 mg/dL (ref 6–20)
BUN: 12 mg/dL (ref 6–20)
BUN: 11 mg/dL (ref 6–20)
Calcium, Ion: 1.28 mmol/L (ref 1.15–1.40)
Calcium, Ion: 1.21 mmol/L (ref 1.15–1.40)
Calcium, Ion: 0.95 mmol/L — ABNORMAL LOW (ref 1.15–1.40)
Calcium, Ion: 0.88 mmol/L — CL (ref 1.15–1.40)
Calcium, Ion: 0.87 mmol/L — CL (ref 1.15–1.40)
Calcium, Ion: 1.03 mmol/L — ABNORMAL LOW (ref 1.15–1.40)
Chloride: 103 mmol/L (ref 98–111)
Chloride: 105 mmol/L (ref 98–111)
Chloride: 106 mmol/L (ref 98–111)
Chloride: 101 mmol/L (ref 98–111)
Chloride: 104 mmol/L (ref 98–111)
Chloride: 105 mmol/L (ref 98–111)
Creatinine, Ser: 0.9 mg/dL (ref 0.61–1.24)
Creatinine, Ser: 0.9 mg/dL (ref 0.61–1.24)
Creatinine, Ser: 0.8 mg/dL (ref 0.61–1.24)
Creatinine, Ser: 0.7 mg/dL (ref 0.61–1.24)
Creatinine, Ser: 0.7 mg/dL (ref 0.61–1.24)
Creatinine, Ser: 0.8 mg/dL (ref 0.61–1.24)
Glucose, Bld: 116 mg/dL — ABNORMAL HIGH (ref 70–99)
Glucose, Bld: 133 mg/dL — ABNORMAL HIGH (ref 70–99)
Glucose, Bld: 118 mg/dL — ABNORMAL HIGH (ref 70–99)
Glucose, Bld: 118 mg/dL — ABNORMAL HIGH (ref 70–99)
Glucose, Bld: 131 mg/dL — ABNORMAL HIGH (ref 70–99)
Glucose, Bld: 140 mg/dL — ABNORMAL HIGH (ref 70–99)
HCT: 38 % — ABNORMAL LOW (ref 39.0–52.0)
HCT: 39 % (ref 39.0–52.0)
HCT: 26 % — ABNORMAL LOW (ref 39.0–52.0)
HCT: 26 % — ABNORMAL LOW (ref 39.0–52.0)
HCT: 29 % — ABNORMAL LOW (ref 39.0–52.0)
HCT: 24 % — ABNORMAL LOW (ref 39.0–52.0)
Hemoglobin: 12.9 g/dL — ABNORMAL LOW (ref 13.0–17.0)
Hemoglobin: 13.3 g/dL (ref 13.0–17.0)
Hemoglobin: 8.8 g/dL — ABNORMAL LOW (ref 13.0–17.0)
Hemoglobin: 8.8 g/dL — ABNORMAL LOW (ref 13.0–17.0)
Hemoglobin: 9.9 g/dL — ABNORMAL LOW (ref 13.0–17.0)
Hemoglobin: 8.2 g/dL — ABNORMAL LOW (ref 13.0–17.0)
Potassium: 4 mmol/L (ref 3.5–5.1)
Potassium: 4.2 mmol/L (ref 3.5–5.1)
Potassium: 6.4 mmol/L (ref 3.5–5.1)
Potassium: 5.5 mmol/L — ABNORMAL HIGH (ref 3.5–5.1)
Potassium: 6 mmol/L — ABNORMAL HIGH (ref 3.5–5.1)
Potassium: 4 mmol/L (ref 3.5–5.1)
Sodium: 139 mmol/L (ref 135–145)
Sodium: 139 mmol/L (ref 135–145)
Sodium: 137 mmol/L (ref 135–145)
Sodium: 138 mmol/L (ref 135–145)
Sodium: 141 mmol/L (ref 135–145)
Sodium: 143 mmol/L (ref 135–145)
TCO2: 29 mmol/L (ref 22–32)
TCO2: 25 mmol/L (ref 22–32)
TCO2: 25 mmol/L (ref 22–32)
TCO2: 24 mmol/L (ref 22–32)
TCO2: 26 mmol/L (ref 22–32)
TCO2: 25 mmol/L (ref 22–32)

## 2021-04-27 LAB — BASIC METABOLIC PANEL
Anion gap: 7 (ref 5–15)
BUN: 12 mg/dL (ref 6–20)
CO2: 25 mmol/L (ref 22–32)
Calcium: 7.4 mg/dL — ABNORMAL LOW (ref 8.9–10.3)
Chloride: 109 mmol/L (ref 98–111)
Creatinine, Ser: 1.18 mg/dL (ref 0.61–1.24)
GFR, Estimated: >60 mL/min (ref >60)
Glucose, Bld: 145 mg/dL — ABNORMAL HIGH (ref 70–99)
Potassium: 4.1 mmol/L (ref 3.5–5.1)
Sodium: 141 mmol/L (ref 135–145)

## 2021-04-27 LAB — POCT I-STAT 7, (LYTES, BLD GAS, ICA,H+H)
Acid-Base Excess: 1 mmol/L (ref 0.0–2.0)
Acid-Base Excess: 2 mmol/L (ref 0.0–2.0)
Acid-Base Excess: 1 mmol/L (ref 0.0–2.0)
Acid-Base Excess: 1 mmol/L (ref 0.0–2.0)
Acid-Base Excess: 0 mmol/L (ref 0.0–2.0)
Acid-Base Excess: 1 mmol/L (ref 0.0–2.0)
Acid-base deficit: 3 mmol/L — ABNORMAL HIGH (ref 0.0–2.0)
Acid-base deficit: 1 mmol/L (ref 0.0–2.0)
Bicarbonate: 27.2 mmol/L (ref 20.0–28.0)
Bicarbonate: 26.3 mmol/L (ref 20.0–28.0)
Bicarbonate: 22.4 mmol/L (ref 20.0–28.0)
Bicarbonate: 26 mmol/L (ref 20.0–28.0)
Bicarbonate: 25.9 mmol/L (ref 20.0–28.0)
Bicarbonate: 24.7 mmol/L (ref 20.0–28.0)
Bicarbonate: 24.3 mmol/L (ref 20.0–28.0)
Bicarbonate: 26.3 mmol/L (ref 20.0–28.0)
Calcium, Ion: 1.26 mmol/L (ref 1.15–1.40)
Calcium, Ion: 1.03 mmol/L — ABNORMAL LOW (ref 1.15–1.40)
Calcium, Ion: 0.89 mmol/L — CL (ref 1.15–1.40)
Calcium, Ion: 0.82 mmol/L — CL (ref 1.15–1.40)
Calcium, Ion: 0.91 mmol/L — ABNORMAL LOW (ref 1.15–1.40)
Calcium, Ion: 1.01 mmol/L — ABNORMAL LOW (ref 1.15–1.40)
Calcium, Ion: 1.03 mmol/L — ABNORMAL LOW (ref 1.15–1.40)
Calcium, Ion: 1.05 mmol/L — ABNORMAL LOW (ref 1.15–1.40)
HCT: 39 % (ref 39.0–52.0)
HCT: 31 % — ABNORMAL LOW (ref 39.0–52.0)
HCT: 27 % — ABNORMAL LOW (ref 39.0–52.0)
HCT: 28 % — ABNORMAL LOW (ref 39.0–52.0)
HCT: 27 % — ABNORMAL LOW (ref 39.0–52.0)
HCT: 26 % — ABNORMAL LOW (ref 39.0–52.0)
HCT: 26 % — ABNORMAL LOW (ref 39.0–52.0)
HCT: 20 % — ABNORMAL LOW (ref 39.0–52.0)
Hemoglobin: 13.3 g/dL (ref 13.0–17.0)
Hemoglobin: 10.5 g/dL — ABNORMAL LOW (ref 13.0–17.0)
Hemoglobin: 9.2 g/dL — ABNORMAL LOW (ref 13.0–17.0)
Hemoglobin: 9.5 g/dL — ABNORMAL LOW (ref 13.0–17.0)
Hemoglobin: 9.2 g/dL — ABNORMAL LOW (ref 13.0–17.0)
Hemoglobin: 8.8 g/dL — ABNORMAL LOW (ref 13.0–17.0)
Hemoglobin: 8.8 g/dL — ABNORMAL LOW (ref 13.0–17.0)
Hemoglobin: 6.8 g/dL — CL (ref 13.0–17.0)
O2 Saturation: 100 %
O2 Saturation: 100 %
O2 Saturation: 100 %
O2 Saturation: 100 %
O2 Saturation: 100 %
O2 Saturation: 100 %
O2 Saturation: 100 %
O2 Saturation: 100 %
Patient temperature: 34.1
Patient temperature: 36.9
Potassium: 4 mmol/L (ref 3.5–5.1)
Potassium: 4.3 mmol/L (ref 3.5–5.1)
Potassium: 5.3 mmol/L — ABNORMAL HIGH (ref 3.5–5.1)
Potassium: 5.6 mmol/L — ABNORMAL HIGH (ref 3.5–5.1)
Potassium: 5.6 mmol/L — ABNORMAL HIGH (ref 3.5–5.1)
Potassium: 3.9 mmol/L (ref 3.5–5.1)
Potassium: 3.6 mmol/L (ref 3.5–5.1)
Potassium: 4.1 mmol/L (ref 3.5–5.1)
Sodium: 138 mmol/L (ref 135–145)
Sodium: 140 mmol/L (ref 135–145)
Sodium: 139 mmol/L (ref 135–145)
Sodium: 140 mmol/L (ref 135–145)
Sodium: 141 mmol/L (ref 135–145)
Sodium: 144 mmol/L (ref 135–145)
Sodium: 146 mmol/L — ABNORMAL HIGH (ref 135–145)
Sodium: 146 mmol/L — ABNORMAL HIGH (ref 135–145)
TCO2: 29 mmol/L (ref 22–32)
TCO2: 28 mmol/L (ref 22–32)
TCO2: 24 mmol/L (ref 22–32)
TCO2: 27 mmol/L (ref 22–32)
TCO2: 27 mmol/L (ref 22–32)
TCO2: 26 mmol/L (ref 22–32)
TCO2: 26 mmol/L (ref 22–32)
TCO2: 28 mmol/L (ref 22–32)
pCO2 arterial: 46.4 mmHg (ref 32.0–48.0)
pCO2 arterial: 40.1 mmHg (ref 32.0–48.0)
pCO2 arterial: 38.4 mmHg (ref 32.0–48.0)
pCO2 arterial: 43.1 mmHg (ref 32.0–48.0)
pCO2 arterial: 40.3 mmHg (ref 32.0–48.0)
pCO2 arterial: 39.2 mmHg (ref 32.0–48.0)
pCO2 arterial: 37 mmHg (ref 32.0–48.0)
pCO2 arterial: 43.8 mmHg (ref 32.0–48.0)
pH, Arterial: 7.376 (ref 7.350–7.450)
pH, Arterial: 7.425 (ref 7.350–7.450)
pH, Arterial: 7.374 (ref 7.350–7.450)
pH, Arterial: 7.388 (ref 7.350–7.450)
pH, Arterial: 7.415 (ref 7.350–7.450)
pH, Arterial: 7.408 (ref 7.350–7.450)
pH, Arterial: 7.413 (ref 7.350–7.450)
pH, Arterial: 7.386 (ref 7.350–7.450)
pO2, Arterial: 547 mmHg — ABNORMAL HIGH (ref 83.0–108.0)
pO2, Arterial: 365 mmHg — ABNORMAL HIGH (ref 83.0–108.0)
pO2, Arterial: 244 mmHg — ABNORMAL HIGH (ref 83.0–108.0)
pO2, Arterial: 248 mmHg — ABNORMAL HIGH (ref 83.0–108.0)
pO2, Arterial: 229 mmHg — ABNORMAL HIGH (ref 83.0–108.0)
pO2, Arterial: 358 mmHg — ABNORMAL HIGH (ref 83.0–108.0)
pO2, Arterial: 203 mmHg — ABNORMAL HIGH (ref 83.0–108.0)
pO2, Arterial: 202 mmHg — ABNORMAL HIGH (ref 83.0–108.0)

## 2021-04-27 LAB — POCT I-STAT EG7
Acid-Base Excess: 0 mmol/L (ref 0.0–2.0)
Bicarbonate: 24.7 mmol/L (ref 20.0–28.0)
Calcium, Ion: 1.04 mmol/L — ABNORMAL LOW (ref 1.15–1.40)
HCT: 31 % — ABNORMAL LOW (ref 39.0–52.0)
Hemoglobin: 10.5 g/dL — ABNORMAL LOW (ref 13.0–17.0)
O2 Saturation: 88 %
Potassium: 4.3 mmol/L (ref 3.5–5.1)
Sodium: 140 mmol/L (ref 135–145)
TCO2: 26 mmol/L (ref 22–32)
pCO2, Ven: 38.7 mmHg — ABNORMAL LOW (ref 44.0–60.0)
pH, Ven: 7.413 (ref 7.250–7.430)
pO2, Ven: 54 mmHg — ABNORMAL HIGH (ref 32.0–45.0)

## 2021-04-27 LAB — MAGNESIUM: Magnesium: 2.8 mg/dL — ABNORMAL HIGH (ref 1.7–2.4)

## 2021-04-27 LAB — HEMOGLOBIN AND HEMATOCRIT, BLOOD
HCT: 29.1 % — ABNORMAL LOW (ref 39.0–52.0)
Hemoglobin: 10.1 g/dL — ABNORMAL LOW (ref 13.0–17.0)

## 2021-04-27 LAB — ECHO INTRAOPERATIVE TEE
AV Mean grad: 35.3 mmHg
AV Peak grad: 46.7 mmHg
Ao pk vel: 3.42 m/s
Height: 70 in
P 1/2 time: 399 msec
Weight: 3104 oz

## 2021-04-27 LAB — GLUCOSE, CAPILLARY
Glucose-Capillary: 99 mg/dL (ref 70–99)
Glucose-Capillary: 126 mg/dL — ABNORMAL HIGH (ref 70–99)
Glucose-Capillary: 112 mg/dL — ABNORMAL HIGH (ref 70–99)
Glucose-Capillary: 105 mg/dL — ABNORMAL HIGH (ref 70–99)
Glucose-Capillary: 143 mg/dL — ABNORMAL HIGH (ref 70–99)
Glucose-Capillary: 104 mg/dL — ABNORMAL HIGH (ref 70–99)
Glucose-Capillary: 115 mg/dL — ABNORMAL HIGH (ref 70–99)
Glucose-Capillary: 132 mg/dL — ABNORMAL HIGH (ref 70–99)
Glucose-Capillary: 142 mg/dL — ABNORMAL HIGH (ref 70–99)

## 2021-04-27 LAB — PLATELET COUNT: Platelets: 73 10*3/uL — ABNORMAL LOW (ref 150–400)

## 2021-04-27 LAB — ABO/RH: ABO/RH(D): O POS

## 2021-04-27 LAB — APTT: aPTT: 52 seconds — ABNORMAL HIGH (ref 24–36)

## 2021-04-27 LAB — PROTIME-INR
INR: 2 — ABNORMAL HIGH (ref 0.8–1.2)
Prothrombin Time: 23 seconds — ABNORMAL HIGH (ref 11.4–15.2)

## 2021-04-27 LAB — PREPARE RBC (CROSSMATCH)

## 2021-04-27 LAB — FIBRINOGEN: Fibrinogen: 108 mg/dL — ABNORMAL LOW (ref 210–475)

## 2021-04-27 SURGERY — REDO AORTIC VALVE REPLACEMENT (AVR)
Anesthesia: General | Site: Esophagus

## 2021-04-27 MED ORDER — PROTAMINE SULFATE 10 MG/ML IV SOLN
INTRAVENOUS | Status: DC | PRN
  Administered 2021-04-27: 50 mg via INTRAVENOUS
  Administered 2021-04-27: 290 mg via INTRAVENOUS

## 2021-04-27 MED ORDER — FENTANYL CITRATE (PF) 250 MCG/5ML IJ SOLN
INTRAMUSCULAR | Status: AC
  Filled 2021-04-27: qty 5

## 2021-04-27 MED ORDER — LACTATED RINGERS IV SOLN: INTRAVENOUS | Status: DC

## 2021-04-27 MED ORDER — ONDANSETRON HCL 4 MG/2ML IJ SOLN
4.0000 mg | Freq: Four times a day (QID) | INTRAMUSCULAR | Status: DC | PRN
  Administered 2021-04-28: 08:00:00 4 mg via INTRAVENOUS
  Filled 2021-04-27: qty 2

## 2021-04-27 MED ORDER — ORAL CARE MOUTH RINSE
15.0000 mL | OROMUCOSAL | Status: DC
  Administered 2021-04-27 (×3): 15 mL via OROMUCOSAL

## 2021-04-27 MED ORDER — PLASMA-LYTE A IV SOLN
INTRAVENOUS | Status: DC | PRN
  Administered 2021-04-27: 1000 mL

## 2021-04-27 MED ORDER — THROMBIN 20000 UNITS EX SOLR
OROMUCOSAL | Status: DC | PRN
  Administered 2021-04-27: 12 mL via TOPICAL

## 2021-04-27 MED ORDER — MIDAZOLAM HCL 2 MG/2ML IJ SOLN
INTRAMUSCULAR | Status: DC | PRN
  Administered 2021-04-27 (×2): 1 mg via INTRAVENOUS
  Administered 2021-04-27: 2 mg via INTRAVENOUS
  Administered 2021-04-27: 3 mg via INTRAVENOUS
  Administered 2021-04-27: 1 mg via INTRAVENOUS
  Administered 2021-04-27: 2 mg via INTRAVENOUS

## 2021-04-27 MED ORDER — SODIUM CHLORIDE 0.9 % IV SOLN
250.0000 mL | INTRAVENOUS | Status: DC
  Administered 2021-04-27: 250 mL via INTRAVENOUS

## 2021-04-27 MED ORDER — CHLORHEXIDINE GLUCONATE 0.12 % MT SOLN
15.0000 mL | Freq: Once | OROMUCOSAL | Status: AC
  Filled 2021-04-27: qty 15

## 2021-04-27 MED ORDER — INSULIN REGULAR(HUMAN) IN NACL 100-0.9 UT/100ML-% IV SOLN: INTRAVENOUS | Status: DC

## 2021-04-27 MED ORDER — PANTOPRAZOLE SODIUM 40 MG PO TBEC
40.0000 mg | DELAYED_RELEASE_TABLET | Freq: Every day | ORAL | Status: DC
  Administered 2021-04-29 – 2021-05-03 (×5): 40 mg via ORAL
  Filled 2021-04-27 (×5): qty 1

## 2021-04-27 MED ORDER — ASPIRIN 81 MG PO CHEW: 324.0000 mg | CHEWABLE_TABLET | Freq: Every day | ORAL | Status: DC

## 2021-04-27 MED ORDER — MIDAZOLAM HCL 2 MG/2ML IJ SOLN: 2.0000 mg | INTRAMUSCULAR | Status: DC | PRN

## 2021-04-27 MED ORDER — SODIUM CHLORIDE 0.9 % IV SOLN: INTRAVENOUS | Status: DC

## 2021-04-27 MED ORDER — ACETAMINOPHEN 650 MG RE SUPP: 650.0000 mg | Freq: Once | RECTAL | Status: AC

## 2021-04-27 MED ORDER — METOPROLOL TARTRATE 5 MG/5ML IV SOLN
2.5000 mg | INTRAVENOUS | Status: DC | PRN
  Administered 2021-04-29: 5 mg via INTRAVENOUS
  Filled 2021-04-27: qty 5

## 2021-04-27 MED ORDER — MAGNESIUM SULFATE 4 GM/100ML IV SOLN
4.0000 g | Freq: Once | INTRAVENOUS | Status: AC
  Administered 2021-04-27: 4 g via INTRAVENOUS
  Filled 2021-04-27: qty 100

## 2021-04-27 MED ORDER — THROMBIN 20000 UNITS EX SOLR
CUTANEOUS | Status: DC | PRN
  Administered 2021-04-27: 20000 [IU] via TOPICAL

## 2021-04-27 MED ORDER — METOPROLOL TARTRATE 12.5 MG HALF TABLET
12.5000 mg | ORAL_TABLET | Freq: Two times a day (BID) | ORAL | Status: DC
  Administered 2021-04-28 (×3): 12.5 mg via ORAL
  Filled 2021-04-27 (×2): qty 1

## 2021-04-27 MED ORDER — VANCOMYCIN HCL IN DEXTROSE 1-5 GM/200ML-% IV SOLN
1000.0000 mg | Freq: Once | INTRAVENOUS | Status: AC
  Administered 2021-04-27: 1000 mg via INTRAVENOUS
  Filled 2021-04-27: qty 200

## 2021-04-27 MED ORDER — PROPOFOL 10 MG/ML IV BOLUS
INTRAVENOUS | Status: DC | PRN
  Administered 2021-04-27: 50 mg via INTRAVENOUS
  Administered 2021-04-27: 30 mg via INTRAVENOUS

## 2021-04-27 MED ORDER — FENTANYL CITRATE (PF) 250 MCG/5ML IJ SOLN
INTRAMUSCULAR | Status: DC | PRN
  Administered 2021-04-27: 400 ug via INTRAVENOUS
  Administered 2021-04-27: 150 ug via INTRAVENOUS
  Administered 2021-04-27: 50 ug via INTRAVENOUS
  Administered 2021-04-27: 250 ug via INTRAVENOUS
  Administered 2021-04-27: 50 ug via INTRAVENOUS
  Administered 2021-04-27: 100 ug via INTRAVENOUS
  Administered 2021-04-27: 50 ug via INTRAVENOUS
  Administered 2021-04-27: 250 ug via INTRAVENOUS
  Administered 2021-04-27 (×4): 50 ug via INTRAVENOUS

## 2021-04-27 MED ORDER — ALBUMIN HUMAN 5 % IV SOLN: INTRAVENOUS | Status: DC | PRN

## 2021-04-27 MED ORDER — METOPROLOL TARTRATE 12.5 MG HALF TABLET: 12.5000 mg | ORAL_TABLET | Freq: Once | ORAL | Status: DC

## 2021-04-27 MED ORDER — ETOMIDATE 2 MG/ML IV SOLN
INTRAVENOUS | Status: DC | PRN
  Administered 2021-04-27: 10 mg via INTRAVENOUS

## 2021-04-27 MED ORDER — CHLORHEXIDINE GLUCONATE CLOTH 2 % EX PADS
6.0000 | MEDICATED_PAD | Freq: Every day | CUTANEOUS | Status: DC
  Administered 2021-04-27 – 2021-04-30 (×4): 6 via TOPICAL

## 2021-04-27 MED ORDER — HYDROCORTISONE SOD SUC (PF) 250 MG IJ SOLR
INTRAMUSCULAR | Status: AC
  Filled 2021-04-27: qty 250

## 2021-04-27 MED ORDER — PHENYLEPHRINE HCL-NACL 20-0.9 MG/250ML-% IV SOLN
0.0000 ug/min | INTRAVENOUS | Status: DC
  Filled 2021-04-27: qty 250

## 2021-04-27 MED ORDER — SODIUM CHLORIDE 0.45 % IV SOLN: INTRAVENOUS | Status: DC | PRN

## 2021-04-27 MED ORDER — LACTATED RINGERS IV SOLN: INTRAVENOUS | Status: DC | PRN

## 2021-04-27 MED ORDER — HEPARIN SODIUM (PORCINE) 1000 UNIT/ML IJ SOLN
INTRAMUSCULAR | Status: DC | PRN
  Administered 2021-04-27: 29000 [IU] via INTRAVENOUS

## 2021-04-27 MED ORDER — ROCURONIUM BROMIDE 10 MG/ML (PF) SYRINGE
PREFILLED_SYRINGE | INTRAVENOUS | Status: DC | PRN
  Administered 2021-04-27: 50 mg via INTRAVENOUS
  Administered 2021-04-27: 60 mg via INTRAVENOUS
  Administered 2021-04-27: 40 mg via INTRAVENOUS
  Administered 2021-04-27: 50 mg via INTRAVENOUS
  Administered 2021-04-27: 30 mg via INTRAVENOUS
  Administered 2021-04-27 (×2): 50 mg via INTRAVENOUS

## 2021-04-27 MED ORDER — DEXTROSE 50 % IV SOLN: 0.0000 mL | INTRAVENOUS | Status: DC | PRN

## 2021-04-27 MED ORDER — DEXMEDETOMIDINE HCL IN NACL 400 MCG/100ML IV SOLN
0.0000 ug/kg/h | INTRAVENOUS | Status: DC
  Administered 2021-04-27: 0.7 ug/kg/h via INTRAVENOUS

## 2021-04-27 MED ORDER — CHLORHEXIDINE GLUCONATE 0.12 % MT SOLN
15.0000 mL | OROMUCOSAL | Status: AC
  Administered 2021-04-27: 15 mL via OROMUCOSAL

## 2021-04-27 MED ORDER — PHENYLEPHRINE 40 MCG/ML (10ML) SYRINGE FOR IV PUSH (FOR BLOOD PRESSURE SUPPORT)
PREFILLED_SYRINGE | INTRAVENOUS | Status: DC | PRN
  Administered 2021-04-27 (×2): 40 ug via INTRAVENOUS
  Administered 2021-04-27 (×2): 120 ug via INTRAVENOUS
  Administered 2021-04-27: 40 ug via INTRAVENOUS

## 2021-04-27 MED ORDER — CHLORHEXIDINE GLUCONATE 0.12% ORAL RINSE (MEDLINE KIT)
15.0000 mL | Freq: Two times a day (BID) | OROMUCOSAL | Status: DC
  Administered 2021-04-27: 15 mL via OROMUCOSAL

## 2021-04-27 MED ORDER — POTASSIUM CHLORIDE 10 MEQ/50ML IV SOLN
10.0000 meq | INTRAVENOUS | Status: AC
  Administered 2021-04-27 (×3): 10 meq via INTRAVENOUS

## 2021-04-27 MED ORDER — SODIUM CHLORIDE 0.9% IV SOLUTION: Freq: Once | INTRAVENOUS | Status: AC

## 2021-04-27 MED ORDER — ACETAMINOPHEN 160 MG/5ML PO SOLN
1000.0000 mg | Freq: Four times a day (QID) | ORAL | Status: AC
  Administered 2021-04-27: 23:00:00 1000 mg
  Filled 2021-04-27: qty 40.6

## 2021-04-27 MED ORDER — THROMBIN (RECOMBINANT) 20000 UNITS EX SOLR
CUTANEOUS | Status: AC
  Filled 2021-04-27: qty 20000

## 2021-04-27 MED ORDER — CEFAZOLIN SODIUM-DEXTROSE 2-4 GM/100ML-% IV SOLN
2.0000 g | Freq: Three times a day (TID) | INTRAVENOUS | Status: AC
  Administered 2021-04-27 – 2021-04-29 (×6): 2 g via INTRAVENOUS
  Filled 2021-04-27 (×6): qty 100

## 2021-04-27 MED ORDER — MORPHINE SULFATE (PF) 2 MG/ML IV SOLN
1.0000 mg | INTRAVENOUS | Status: DC | PRN
  Administered 2021-04-28 – 2021-04-29 (×4): 2 mg via INTRAVENOUS
  Filled 2021-04-27 (×4): qty 1

## 2021-04-27 MED ORDER — BISACODYL 10 MG RE SUPP: 10.0000 mg | Freq: Every day | RECTAL | Status: DC

## 2021-04-27 MED ORDER — 0.9 % SODIUM CHLORIDE (POUR BTL) OPTIME
TOPICAL | Status: DC | PRN
  Administered 2021-04-27: 5000 mL

## 2021-04-27 MED ORDER — ACETAMINOPHEN 160 MG/5ML PO SOLN
650.0000 mg | Freq: Once | ORAL | Status: AC
  Administered 2021-04-27: 650 mg

## 2021-04-27 MED ORDER — BISACODYL 5 MG PO TBEC
10.0000 mg | DELAYED_RELEASE_TABLET | Freq: Every day | ORAL | Status: DC
  Administered 2021-04-28 – 2021-04-29 (×2): 10 mg via ORAL
  Filled 2021-04-27 (×2): qty 2

## 2021-04-27 MED ORDER — SODIUM CHLORIDE 0.9% FLUSH: 3.0000 mL | INTRAVENOUS | Status: DC | PRN

## 2021-04-27 MED ORDER — SODIUM CHLORIDE 0.9% FLUSH
3.0000 mL | Freq: Two times a day (BID) | INTRAVENOUS | Status: DC
  Administered 2021-04-28 – 2021-04-29 (×2): 3 mL via INTRAVENOUS

## 2021-04-27 MED ORDER — ALBUMIN HUMAN 5 % IV SOLN
250.0000 mL | INTRAVENOUS | Status: AC | PRN
  Administered 2021-04-27 (×4): 12.5 g via INTRAVENOUS
  Filled 2021-04-27 (×2): qty 250

## 2021-04-27 MED ORDER — ASPIRIN EC 325 MG PO TBEC: 325.0000 mg | DELAYED_RELEASE_TABLET | Freq: Every day | ORAL | Status: DC

## 2021-04-27 MED ORDER — ACETAMINOPHEN 500 MG PO TABS
1000.0000 mg | ORAL_TABLET | Freq: Four times a day (QID) | ORAL | Status: AC
  Administered 2021-04-28 – 2021-05-02 (×13): 1000 mg via ORAL
  Filled 2021-04-27 (×15): qty 2

## 2021-04-27 MED ORDER — NOREPINEPHRINE 4 MG/250ML-% IV SOLN: 0.0000 ug/min | INTRAVENOUS | Status: DC

## 2021-04-27 MED ORDER — NITROGLYCERIN IN D5W 200-5 MCG/ML-% IV SOLN: 0.0000 ug/min | INTRAVENOUS | Status: DC

## 2021-04-27 MED ORDER — PROPOFOL 10 MG/ML IV BOLUS
INTRAVENOUS | Status: AC
  Filled 2021-04-27: qty 20

## 2021-04-27 MED ORDER — HEMOSTATIC AGENTS (NO CHARGE) OPTIME
TOPICAL | Status: DC | PRN
  Administered 2021-04-27: 1 via TOPICAL

## 2021-04-27 MED ORDER — LACTATED RINGERS IV SOLN: 500.0000 mL | Freq: Once | INTRAVENOUS | Status: DC | PRN

## 2021-04-27 MED ORDER — EPHEDRINE SULFATE-NACL 50-0.9 MG/10ML-% IV SOSY
PREFILLED_SYRINGE | INTRAVENOUS | Status: DC | PRN
  Administered 2021-04-27 (×2): 10 mg via INTRAVENOUS

## 2021-04-27 MED ORDER — METOPROLOL TARTRATE 25 MG/10 ML ORAL SUSPENSION: 12.5000 mg | Freq: Two times a day (BID) | ORAL | Status: DC

## 2021-04-27 MED ORDER — TRAMADOL HCL 50 MG PO TABS
50.0000 mg | ORAL_TABLET | ORAL | Status: DC | PRN
  Administered 2021-04-28: 50 mg via ORAL
  Administered 2021-04-28: 100 mg via ORAL
  Administered 2021-04-28: 50 mg via ORAL
  Administered 2021-04-29: 100 mg via ORAL
  Filled 2021-04-27: qty 1
  Filled 2021-04-27: qty 2
  Filled 2021-04-27: qty 1
  Filled 2021-04-27: qty 2

## 2021-04-27 MED ORDER — OXYCODONE HCL 5 MG PO TABS
5.0000 mg | ORAL_TABLET | ORAL | Status: DC | PRN
  Administered 2021-04-28: 15:00:00 5 mg via ORAL
  Administered 2021-04-28 (×2): 10 mg via ORAL
  Administered 2021-04-28: 5 mg via ORAL
  Administered 2021-04-29 – 2021-05-03 (×4): 10 mg via ORAL
  Filled 2021-04-27: qty 1
  Filled 2021-04-27 (×4): qty 2
  Filled 2021-04-27: qty 1
  Filled 2021-04-27 (×2): qty 2

## 2021-04-27 MED ORDER — DOCUSATE SODIUM 100 MG PO CAPS
200.0000 mg | ORAL_CAPSULE | Freq: Every day | ORAL | Status: DC
  Administered 2021-04-28 – 2021-04-29 (×2): 200 mg via ORAL
  Filled 2021-04-27 (×2): qty 2

## 2021-04-27 MED ORDER — FAMOTIDINE IN NACL 20-0.9 MG/50ML-% IV SOLN
20.0000 mg | Freq: Two times a day (BID) | INTRAVENOUS | Status: AC
  Administered 2021-04-27 (×2): 20 mg via INTRAVENOUS
  Filled 2021-04-27 (×2): qty 50

## 2021-04-27 MED ORDER — ~~LOC~~ CARDIAC SURGERY, PATIENT & FAMILY EDUCATION
Freq: Once | Status: DC
  Filled 2021-04-27: qty 1

## 2021-04-27 MED ORDER — CHLORHEXIDINE GLUCONATE 4 % EX LIQD: 30.0000 mL | CUTANEOUS | Status: DC

## 2021-04-27 MED ORDER — MIDAZOLAM HCL (PF) 10 MG/2ML IJ SOLN
INTRAMUSCULAR | Status: AC
  Filled 2021-04-27: qty 2

## 2021-04-27 SURGICAL SUPPLY — 87 items
ADAPTER CARDIO PERF ANTE/RETRO (ADAPTER) ×4 IMPLANT
ADPR PRFSN 84XANTGRD RTRGD (ADAPTER) ×2
APL SWBSTK 6 STRL LF DISP (MISCELLANEOUS) ×2
APPLICATOR COTTON TIP 6 STRL (MISCELLANEOUS) ×1 IMPLANT
APPLICATOR COTTON TIP 6IN STRL (MISCELLANEOUS) ×4
BAG DECANTER FOR FLEXI CONT (MISCELLANEOUS) ×4 IMPLANT
BLADE CLIPPER SURG (BLADE) ×4 IMPLANT
BLADE CORE FAN STRYKER (BLADE) ×4 IMPLANT
BLADE SURG 15 STRL LF DISP TIS (BLADE) ×8 IMPLANT
BLADE SURG 15 STRL SS (BLADE) ×28
CANISTER SUCT 3000ML PPV (MISCELLANEOUS) ×4 IMPLANT
CANNULA SUMP PERICARDIAL (CANNULA) ×4 IMPLANT
CATH HEART VENT LEFT (CATHETERS) ×2 IMPLANT
CATH ROBINSON RED A/P 18FR (CATHETERS) ×12 IMPLANT
CATH THORACIC 36FR (CATHETERS) ×4 IMPLANT
CATH THORACIC 36FR RT ANG (CATHETERS) ×4 IMPLANT
CNTNR URN SCR LID CUP LEK RST (MISCELLANEOUS) ×2 IMPLANT
CONT SPEC 4OZ STRL OR WHT (MISCELLANEOUS) ×4
CONTAINER PROTECT SURGISLUSH (MISCELLANEOUS) ×7 IMPLANT
DEVICE SUT CK QUICK LOAD MINI (Prosthesis & Implant Heart) ×2 IMPLANT
DRAPE CARDIOVASCULAR INCISE (DRAPES) ×4
DRAPE SRG 135X102X78XABS (DRAPES) ×2 IMPLANT
DRAPE WARM FLUID 44X44 (DRAPES) ×3 IMPLANT
DRSG COVADERM 4X14 (GAUZE/BANDAGES/DRESSINGS) ×4 IMPLANT
ELECT CAUTERY BLADE 6.4 (BLADE) ×4 IMPLANT
ELECT REM PT RETURN 9FT ADLT (ELECTROSURGICAL) ×8
ELECTRODE REM PT RTRN 9FT ADLT (ELECTROSURGICAL) ×4 IMPLANT
FELT TEFLON 1X6 (MISCELLANEOUS) ×8 IMPLANT
GAUZE SPONGE 4X4 12PLY STRL (GAUZE/BANDAGES/DRESSINGS) ×4 IMPLANT
GAUZE SPONGE 4X4 12PLY STRL LF (GAUZE/BANDAGES/DRESSINGS) ×2 IMPLANT
GLOVE SURG ENC MOIS LTX SZ6.5 (GLOVE) IMPLANT
GLOVE SURG ENC MOIS LTX SZ7 (GLOVE) IMPLANT
GLOVE SURG ENC MOIS LTX SZ7.5 (GLOVE) IMPLANT
GLOVE SURG MICRO LTX SZ7 (GLOVE) ×8 IMPLANT
GOWN STRL REUS W/ TWL LRG LVL3 (GOWN DISPOSABLE) ×8 IMPLANT
GOWN STRL REUS W/ TWL XL LVL3 (GOWN DISPOSABLE) ×2 IMPLANT
GOWN STRL REUS W/TWL LRG LVL3 (GOWN DISPOSABLE) ×16
GOWN STRL REUS W/TWL XL LVL3 (GOWN DISPOSABLE) ×4
HEMOSTAT POWDER SURGIFOAM 1G (HEMOSTASIS) ×12 IMPLANT
HEMOSTAT SURGICEL 2X14 (HEMOSTASIS) ×4 IMPLANT
INSERT FOGARTY XLG (MISCELLANEOUS) ×3 IMPLANT
KIT BASIN OR (CUSTOM PROCEDURE TRAY) ×4 IMPLANT
KIT CATH CPB BARTLE (MISCELLANEOUS) ×4 IMPLANT
KIT COMBO MINI 4X17COR-KNOT (Prosthesis & Implant Heart) ×1 IMPLANT
KIT SUCTION CATH 14FR (SUCTIONS) ×4 IMPLANT
KIT SUT CK MINI COMBO 4X17 (Prosthesis & Implant Heart) ×3 IMPLANT
KIT TURNOVER KIT B (KITS) ×4 IMPLANT
LEAD PACING MYOCARDI (MISCELLANEOUS) ×2 IMPLANT
LINE VENT (MISCELLANEOUS) ×2 IMPLANT
LOAD QUICK .035MM CK MINI (Prosthesis & Implant Heart) ×1 IMPLANT
NS IRRIG 1000ML POUR BTL (IV SOLUTION) ×20 IMPLANT
PACK E OPEN HEART (SUTURE) ×4 IMPLANT
PACK OPEN HEART (CUSTOM PROCEDURE TRAY) ×4 IMPLANT
PAD ARMBOARD 7.5X6 YLW CONV (MISCELLANEOUS) ×8 IMPLANT
PAD DEFIB R2 (MISCELLANEOUS) ×4 IMPLANT
PAD ELECT DEFIB RADIOL ZOLL (MISCELLANEOUS) ×4 IMPLANT
POSITIONER HEAD DONUT 9IN (MISCELLANEOUS) ×4 IMPLANT
SET MPS 3-ND DEL (MISCELLANEOUS) ×2 IMPLANT
SUT BONE WAX W31G (SUTURE) ×4 IMPLANT
SUT EB EXC GRN/WHT 2-0 V-5 (SUTURE) ×8 IMPLANT
SUT ETHIBON EXCEL 2-0 V-5 (SUTURE) ×3 IMPLANT
SUT ETHIBOND 2 0 SH (SUTURE) ×4
SUT ETHIBOND 2 0 SH 36X2 (SUTURE) IMPLANT
SUT ETHIBOND V-5 VALVE (SUTURE) ×6 IMPLANT
SUT PROLENE 3 0 SH 1 (SUTURE) ×4 IMPLANT
SUT PROLENE 3 0 SH DA (SUTURE) IMPLANT
SUT PROLENE 3 0 SH1 36 (SUTURE) ×4 IMPLANT
SUT PROLENE 4 0 RB 1 (SUTURE) ×20
SUT PROLENE 4-0 RB1 .5 CRCL 36 (SUTURE) ×8 IMPLANT
SUT STEEL 6MS V (SUTURE) IMPLANT
SUT STEEL STERNAL CCS#1 18IN (SUTURE) IMPLANT
SUT STEEL SZ 6 DBL 3X14 BALL (SUTURE) IMPLANT
SUT VIC AB 1 CTX 36 (SUTURE) ×8
SUT VIC AB 1 CTX36XBRD ANBCTR (SUTURE) ×4 IMPLANT
SUT VIC AB 2-0 CT1 27 (SUTURE)
SUT VIC AB 2-0 CT1 TAPERPNT 27 (SUTURE) IMPLANT
SYSTEM SAHARA CHEST DRAIN ATS (WOUND CARE) ×4 IMPLANT
TAPE CLOTH SURG 4X10 WHT LF (GAUZE/BANDAGES/DRESSINGS) ×2 IMPLANT
TAPE PAPER 2X10 WHT MICROPORE (GAUZE/BANDAGES/DRESSINGS) ×2 IMPLANT
TOWEL GREEN STERILE (TOWEL DISPOSABLE) ×4 IMPLANT
TOWEL GREEN STERILE FF (TOWEL DISPOSABLE) ×4 IMPLANT
TRAY FOLEY SLVR 16FR TEMP STAT (SET/KITS/TRAYS/PACK) ×4 IMPLANT
TUBE SUCT INTRACARD DLP 20F (MISCELLANEOUS) ×2 IMPLANT
UNDERPAD 30X36 HEAVY ABSORB (UNDERPADS AND DIAPERS) ×4 IMPLANT
VALVE ON-X AORTIC 23MM (Prosthesis & Implant Heart) ×3 IMPLANT
VENT LEFT HEART 12002 (CATHETERS) ×4
WATER STERILE IRR 1000ML POUR (IV SOLUTION) ×8 IMPLANT

## 2021-04-27 NOTE — Brief Op Note (Signed)
04/27/2021  6:59 AM  PATIENT:  [name]  45 y.o. male  PRE-OPERATIVE DIAGNOSIS:  Severe Aortic Stenosis  POST-OPERATIVE DIAGNOSIS:  Severe Aortic Stenosis  PROCEDURE:  Procedure(s): REDO AORTIC VALVE REPLACEMENT (AVR) USING ON-X 23MM AORTIC VALVE (N/A) TRANSESOPHAGEAL ECHOCARDIOGRAM (TEE) (N/A)  SURGEON:  Surgeon(s) and Role:    * Bartle, Fernande Boyden, MD - Primary  PHYSICIAN ASSISTANT: Josede Cicero PA-C  ASSISTANTS: STAFF  ANESTHESIA:   general  EBL:  985 mL   BLOOD ADMINISTERED: 2 PACKS PLTS  DRAINS: 3 CHEST TUBES   LOCAL MEDICATIONS USED:  NONE  SPECIMEN:  Source of Specimen:  PREVIOUS VALVE REPLACEMENT( AORTIC)  DISPOSITION OF SPECIMEN:  PATHOLOGY  COUNTS:  YES  TOURNIQUET:  * No tourniquets in log *  DICTATION: .Dragon Dictation  PLAN OF CARE: Admit to inpatient   PATIENT DISPOSITION:  ICU - intubated and hemodynamically stable.   Delay start of Pharmacological VTE agent (>24hrs) due to surgical blood loss or risk of bleeding: yes

## 2021-04-27 NOTE — Progress Notes (Signed)
Patient ID: [name], male   DOB: 09/03/1975, 45 y.o.   MRN: [id number]  TCTS Evening Rounds:   Hemodynamically stable  CI = 1.3 but filling pressures are low and still on vent.  Precedex off, not awake yet.    Urine output good  CT output 60-80/hr. Fibrinogen 1.8 and INR 2.0. Will give 2 units FFP and cryo  CBC    Component Value Date/Time   WBC 10.2 04/27/2021 1507   RBC 2.83 (L) 04/27/2021 1507   HGB 8.8 (L) 04/27/2021 1509   HGB 14.5 02/24/2021 1248   HCT 26.0 (L) 04/27/2021 1509   HCT 43.0 02/24/2021 1248   PLT 76 (L) 04/27/2021 1507   PLT 164 02/24/2021 1248   MCV 87.6 04/27/2021 1507   MCV 87 02/24/2021 1248   MCH 30.4 04/27/2021 1507   MCHC 34.7 04/27/2021 1507   RDW 11.1 (L) 04/27/2021 1507   RDW 11.1 (L) 02/24/2021 1248   LYMPHSABS 991 02/17/2020 1130   LYMPHSABS 1.1 05/09/2017 0916   MONOABS 0.3 04/11/2018 2235   EOSABS 98 02/17/2020 1130   EOSABS 0.2 05/09/2017 0916   BASOSABS 39 02/17/2020 1130   BASOSABS 0.1 05/09/2017 0916     BMET    Component Value Date/Time   NA 146 (H) 04/27/2021 1509   NA 140 02/24/2021 1248   K 3.6 04/27/2021 1509   CL 105 04/27/2021 1336   CO2 25 04/25/2021 1428   GLUCOSE 140 (H) 04/27/2021 1336   BUN 11 04/27/2021 1336   BUN 11 02/24/2021 1248   CREATININE 0.80 04/27/2021 1336   CREATININE 1.25 02/17/2020 1130   CALCIUM 9.3 04/25/2021 1428   EGFR 71 02/24/2021 1248   GFRNONAA >60 04/25/2021 1428   GFRNONAA 75 02/16/2019 0000     A/P:  Stable postop course. Continue current plans

## 2021-04-27 NOTE — Progress Notes (Signed)
Chaplain paged to notarize advanced directive

## 2021-04-27 NOTE — Discharge Instructions (Addendum)
Discharge Instructions:  1. You may shower, please wash incisions daily with soap and water and keep dry.  If you wish to cover wounds with dressing you may do so but please keep clean and change daily.  No tub baths or swimming until incisions have completely healed.  If your incisions become red or develop any drainage please call our office at [phone]  2. No Driving until cleared by Dr. Vivi Martens office and you are no longer using narcotic pain medications  3. Monitor your weight daily.. Please use the same scale and weigh at same time... If you gain 5-10 lbs in 48 hours with associated lower extremity swelling, please contact our office at [phone]  4. Fever of 101.5 for at least 24 hours with no source, please contact our office at [phone]  5. Activity- up as tolerated, please walk at least 3 times per day.  Avoid strenuous activity, no lifting, pushing, or pulling with your arms over 8-10 lbs for a minimum of 6 weeks  6. If any questions or concerns arise, please do not hesitate to contact our office at [phone]   Information on my medicine - Coumadin   (Warfarin)  This medication education was reviewed with me or my healthcare representative as part of my discharge preparation.  The pharmacist that spoke with me during my hospital stay was:  Pat Patrick, [facility]  Why was Coumadin prescribed for you? Coumadin was prescribed for you because you have a blood clot or a medical condition that can cause an increased risk of forming blood clots. Blood clots can cause serious health problems by blocking the flow of blood to the heart, lung, or brain. Coumadin can prevent harmful blood clots from forming. As a reminder your indication for Coumadin is:  Blood Clot Prevention after Heart Valve Surgery  What test will check on my response to Coumadin? While on Coumadin (warfarin) you will need to have an INR test regularly to ensure that your dose is keeping you in the desired  range. The INR (international normalized ratio) number is calculated from the result of the laboratory test called prothrombin time (PT).  If an INR APPOINTMENT HAS NOT ALREADY BEEN MADE FOR YOU please schedule an appointment to have this lab work done by your health care provider within 7 days. Your INR goal is usually a number between:  2 to 3 or your provider may give you a more narrow range like 2-2.5.  Ask your health care provider during an office visit what your goal INR is.  What  do you need to  know  About  COUMADIN? Take Coumadin (warfarin) exactly as prescribed by your healthcare provider about the same time each day.  DO NOT stop taking without talking to the doctor who prescribed the medication.  Stopping without other blood clot prevention medication to take the place of Coumadin may increase your risk of developing a new clot or stroke.  Get refills before you run out.  What do you do if you miss a dose? If you miss a dose, take it as soon as you remember on the same day then continue your regularly scheduled regimen the next day.  Do not take two doses of Coumadin at the same time.  Important Safety Information A possible side effect of Coumadin (Warfarin) is an increased risk of bleeding. You should call your healthcare provider right away if you experience any of the following: Bleeding from an injury or your nose that does not  stop. Unusual colored urine (red or dark brown) or unusual colored stools (red or black). Unusual bruising for unknown reasons. A serious fall or if you hit your head (even if there is no bleeding).  Some foods or medicines interact with Coumadin (warfarin) and might alter your response to warfarin. To help avoid this: Eat a balanced diet, maintaining a consistent amount of Vitamin K. Notify your provider about major diet changes you plan to make. Avoid alcohol or limit your intake to 1 drink for women and 2 drinks for men per day. (1 drink is 5 oz.  wine, 12 oz. beer, or 1.5 oz. liquor.)  Make sure that ANY health care provider who prescribes medication for you knows that you are taking Coumadin (warfarin).  Also make sure the healthcare provider who is monitoring your Coumadin knows when you have started a new medication including herbals and non-prescription products.  Coumadin (Warfarin)  Major Drug Interactions  Increased Warfarin Effect Decreased Warfarin Effect  Alcohol (large quantities) Antibiotics (esp. Septra/Bactrim, Flagyl, Cipro) Amiodarone (Cordarone) Aspirin (ASA) Cimetidine (Tagamet) Megestrol (Megace) NSAIDs (ibuprofen, naproxen, etc.) Piroxicam (Feldene) Propafenone (Rythmol SR) Propranolol (Inderal) Isoniazid (INH) Posaconazole (Noxafil) Barbiturates (Phenobarbital) Carbamazepine (Tegretol) Chlordiazepoxide (Librium) Cholestyramine (Questran) Griseofulvin Oral Contraceptives Rifampin Sucralfate (Carafate) Vitamin K   Coumadin (Warfarin) Major Herbal Interactions  Increased Warfarin Effect Decreased Warfarin Effect  Garlic Ginseng Ginkgo biloba Coenzyme Q10 Green tea St. John's wort    Coumadin (Warfarin) FOOD Interactions  Eat a consistent number of servings per week of foods HIGH in Vitamin K (1 serving =  cup)  Collards (cooked, or boiled & drained) Kale (cooked, or boiled & drained) Mustard greens (cooked, or boiled & drained) Parsley *serving size only =  cup Spinach (cooked, or boiled & drained) Swiss chard (cooked, or boiled & drained) Turnip greens (cooked, or boiled & drained)  Eat a consistent number of servings per week of foods MEDIUM-HIGH in Vitamin K (1 serving = 1 cup)  Asparagus (cooked, or boiled & drained) Broccoli (cooked, boiled & drained, or raw & chopped) Brussel sprouts (cooked, or boiled & drained) *serving size only =  cup Lettuce, raw (green leaf, endive, romaine) Spinach, raw Turnip greens, raw & chopped   These websites have more information on Coumadin  (warfarin):  FailFactory.se; VeganReport.com.au;

## 2021-04-27 NOTE — Op Note (Signed)
CARDIOVASCULAR SURGERY OPERATIVE NOTE  04/27/2021 [name] [id number]  Surgeon:  Gaye Pollack, MD  First Assistant: Jadene Pierini,  PA-C: An experienced assistant was required given the complexity of this surgery and the standard of surgical care. The assistant was needed for exposure, dissection, suctioning, retraction of delicate tissues and sutures, instrument exchange and for overall help during this procedure.    Preoperative Diagnosis:  Severe prosthetic aortic valve stenosis and moderate insufficiency   Postoperative Diagnosis:  Same   Procedure:  3rd time Redo Median Sternotomy Extracorporeal circulation 3.   Aortic valve replacement using a 23 mm ON-X  mechanical valve.  Anesthesia:  General Endotracheal   Clinical History/Surgical Indication:  The patient is a 45 year old gentleman with a history of hypertension who underwent VSD repair at age 45 [address] [place]). He reports having severe aortic insufficiency at age 45 and underwent bioprosthetic aortic valve replacement ([place]) using a 25 mm Perimount. 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 He said that he was given a choice to have a mechanical or bioprosthetic valve and chose to have a bioprosthetic valve so would not have to be on Coumadin. He was subsequently found to have episodes of ventricular tachycardia in 2008 and underwent implantation of an ICD. He has been followed by Dr. Caryl Comes. He has had a few appropriate shocks for ventricular tachycardia over the years as well as ATP therapy with his most recent shock being yesterday. He said that this does not happen very often. He was evaluated at [facility] for a VT ablation but was found to have an epicardial source and it was not performed.   His echocardiograms have shown stable left ventricular systolic dysfunction with ejection fraction of 35 to 40%. In 2020 the mean gradient across the prosthetic valve was  43 mmHg. In 2021 the gradient was 33 mmHg. His most recent echo in September 2022 showed a mean gradient of 42 mmHg. There is no mitral regurgitation or stenosis. Left ventricular ejection fraction is 35 to 40% with global hypokinesis. Left ventricular cavity is moderately dilated at 6.2 cm in diastole and 5.1 cm in systole. Stroke-volume index is 36. He subsequently underwent cardiac catheterization on 03/01/2021. This showed single-vessel coronary disease with chronic total occlusion of the RCA with faint collaterals from the left. There is no other obstructive disease. The mean gradient across the prosthetic aortic valve was 57 mmHg with a peak to peak gradient of 74 mmHg. Valve area was calculated at 0.7 cm. Right heart pressures were normal.   He reports generalized fatigue and shortness of breath with exertion. He denies any chest pain or pressure. He has had some dizziness. He denies peripheral edema.    I think the best treatment for him is redo aortic valve replacement using a mechanical valve. He has no contraindication to anticoagulation. I discussed valve in valve TAVR which I would not recommend due to his relatively young age and the risk of structural valve deterioration requiring further intervention. I discussed the pros and cons of mechanical and bioprosthetic valves and the high risk of structural valve deterioration with a bioprosthetic valve at his young age and he understands and agrees to proceed with a mechanical valve. Review of his CTA of the chest shows some calcification of the ascending aorta extending from the sinotubular junction up to the takeoff of the innominate artery. This appears fairly localized along the right lateral surface of the aorta in  a narrow band. It may be necessary to replace the ascending aorta to allow crossclamping but that decision will need to be made in the operating room. I reviewed the CTA images with him and explained that possibility. He has chronic  occlusion of the right coronary artery but the distal vessel appears relatively small caliber and I do not think is suitable for grafting.    I discussed the operative procedure with the patient including alternatives, benefits and risks; including but not limited to bleeding, blood transfusion, infection, stroke, myocardial infarction, heart block requiring a permanent pacemaker, organ dysfunction, and death. [name] understands and agrees to proceed.     Preparation:  The patient was seen in the preoperative holding area and the correct patient, correct operation were confirmed with the patient after reviewing the medical record and catheterization. The consent was signed by me. Preoperative antibiotics were given. A pulmonary arterial line and radial arterial line were placed by the anesthesia team. The patient was taken back to the operating room and positioned supine on the operating room table. After being placed under general endotracheal anesthesia by the anesthesia team a foley catheter was placed. The neck, chest, abdomen, and both legs were prepped with betadine soap and solution and draped in the usual sterile manner. A surgical time-out was taken and the correct patient and operative procedure were confirmed with the nursing and anesthesia staff.   Pre-bypass TEE:   Complete TEE assessment was performed by Dr. Nyoka Cowden. This showed severe prosthetic aortic stenosis with a mean gradient of 57 mm Hg. There was moderate AI. LVEF was moderately decreased at 35-40%.     Post-bypass TEE:   Normal functioning prosthetic aortic valve with no perivalvular leak or regurgitation through the valve. Mean gradient 4 mm Hg. Left ventricular function preserved. no mitral regurgitation.    Cardiopulmonary Bypass:  A redo median sternotomy was performed using the oscillating saw without difficulty.The sternal edges were retracted with bone hooks and the sternum was dissected free from the  heart. There was a gortex membrane patch that had been placed to close the pericardium anteriorly. It was firmly adhered to the sternum and heart.  The gortex membrane was resected.  Right ventricular function appeared normal. The ascending aorta was of normal size and had a calcified band running along the right lateral surface of the aorta. The aortic arch had calcified plaque but the ascending aorta was soft everywhere except where the calcified band was laterally. There were no contraindications to aortic cannulation or cross-clamping. Dissection of the pericardial adhesions was performed to expose the right atrium and ascending aorta. The patient was fully systemically heparinized and the ACT was maintained > 400 sec. The distal ascending aorta was cannulated with a 20 F aortic cannula for arterial inflow. Venous cannulation was performed via the right atrial appendage using a two-staged venous cannula. An antegrade cardioplegia/vent cannula was inserted into the proximal-ascending aorta. A left ventricular vent was placed via the right superior pulmonary vein. A retrograde cardioplegia cannnula was placed into the coronary sinus via the right atrium but did not feel like it went into the coronary sinus very far.  Aortic occlusion was performed with a single cross-clamp. Systemic cooling to 28 degrees Centigrade and topical cooling of the heart with iced saline were used. Cold antegrade KBC cardioplegia was used to induce diastolic arrest since the retrograde cannula did not appear to working correctly and then cold KBC cardioplegia was given directly into the left main  orifice using a hand held cannula at about 60 minute intervals throughout the period of arrest to maintain myocardial temperature at or below 10 degrees centigrade. A temperature probe was inserted into the interventricular septum. Carbon dioxide was insufflated into the pericardium at 5L/min throughout the procedure to minimize intracardiac  air.   Aortic Valve Replacement:   A transverse aortotomy was performed through the previous aortotomy felt closure. The old aortic valve was severely calcified and thickened with completely non-mobile left and right cusps and a little motion from the non-coronary cusp. The valve had extensive pannus formation over the sewing ring and the commissural posts were attached to the aortic root wall above the annulus. The valve was carefully removed by exposing and cutting the sutures and separating the sewing ring from the annulus. This took a long time due to the extensive pannus and calcification around the valve.  Care was taken to remove all particulate debris. The left ventricle was directly inspected for debris and then irrigated with ice saline solution. The annulus was sized and a size 23 mm ON-X mechanical valve was chosen. The model number was [facility] and the serial number was [id number]. While the valve was being prepared 2-0 Ethibond pledgeted horizontal mattress sutures were placed around the annulus with the pledgets in a sub-annular position. The sutures were placed through the sewing ring and the valve lowered into place. The sutures were tied sequentially. The valve seated nicely and the left coronary ostium was not obstructed. The right coronary ostium was not seen. This was reportedly occluded with his prior valve replacement. The prosthetic valve leaflets moved normally and there was no sub-valvular obstruction. The aortotomy was closed using 4-0 Prolene suture in 2 layers with felt strips to reinforce the closure.  Completion:  The patient was rewarmed to 37 degrees Centigrade. De-airing maneuvers were performed and the head placed in trendelenburg position. A reanimation dose of warm KBC cardioplegia was given antegrade. The crossclamp was removed with a time of 162 minutes. There was spontaneous return of sinus rhythm. The aortotomy was checked for hemostasis. Two temporary epicardial pacing  wires were placed on the right atrium and a bipolar lead on the inferior wall.  The left ventricular vent and retrograde cardioplegia cannulas were removed. The patient was weaned from CPB without difficulty on no inotropes. CPB time was 213 minutes. Cardiac output was 5 LPM. Heparin was fully reversed with protamine and the aortic and venous cannulas removed. Hemostasis was achieved. Mediastinal drainage tubes were placed. The sternum was closed with double #6 stainless steel wires. The fascia was closed with continuous # 1 vicryl suture. The subcutaneous tissue was closed with 2-0 vicryl continuous suture. The skin was closed with 3-0 vicryl subcuticular suture. All sponge, needle, and instrument counts were reported correct at the end of the case. Dry sterile dressings were placed over the incisions and around the chest tubes which were connected to pleurevac suction. The patient was then transported to the surgical intensive care unit in  stable condition.

## 2021-04-27 NOTE — Anesthesia Preprocedure Evaluation (Signed)
Anesthesia Evaluation  Patient identified by MRN, date of birth, ID band Patient awake    Reviewed: Allergy & Precautions, NPO status , Patient's Chart, lab work & pertinent test results  History of Anesthesia Complications (+) PONV  Airway Mallampati: II  TM Distance: >3 FB     Dental   Pulmonary neg pulmonary ROS,    breath sounds clear to auscultation       Cardiovascular hypertension, + Past MI  + dysrhythmias + Cardiac Defibrillator + Valvular Problems/Murmurs  Rhythm:Regular Rate:Normal     Neuro/Psych negative neurological ROS     GI/Hepatic negative GI ROS, Neg liver ROS,   Endo/Other  negative endocrine ROS  Renal/GU      Musculoskeletal   Abdominal   Peds  Hematology   Anesthesia Other Findings   Reproductive/Obstetrics                             Anesthesia Physical Anesthesia Plan  ASA: 3  Anesthesia Plan: General   Post-op Pain Management:    Induction: Intravenous  PONV Risk Score and Plan: Midazolam and Ondansetron  Airway Management Planned: Oral ETT  Additional Equipment: Arterial line, PA Cath, TEE and Ultrasound Guidance Line Placement  Intra-op Plan:   Post-operative Plan: Post-operative intubation/ventilation  Informed Consent: I have reviewed the patients History and Physical, chart, labs and discussed the procedure including the risks, benefits and alternatives for the proposed anesthesia with the patient or authorized representative who has indicated his/her understanding and acceptance.     Dental advisory given  Plan Discussed with: CRNA and Anesthesiologist  Anesthesia Plan Comments:         Anesthesia Quick Evaluation

## 2021-04-27 NOTE — Anesthesia Procedure Notes (Addendum)
Procedure Name: Intubation Date/Time: 04/27/2021 7:52 AM Performed by: Lorie Phenix, CRNA Pre-anesthesia Checklist: Patient identified, Emergency Drugs available, Suction available and Patient being monitored Patient Re-evaluated:Patient Re-evaluated prior to induction Oxygen Delivery Method: Circle system utilized Preoxygenation: Pre-oxygenation with 100% oxygen Induction Type: IV induction Ventilation: Mask ventilation without difficulty and Oral airway inserted - appropriate to patient size Laryngoscope Size: Mac and 4 Grade View: Grade I Tube type: Oral Tube size: 8.0 mm Number of attempts: 1 Airway Equipment and Method: Stylet and Oral airway Placement Confirmation: ETT inserted through vocal cords under direct vision, positive ETCO2 and breath sounds checked- equal and bilateral Secured at: 24 cm Tube secured with: Tape Dental Injury: Teeth and Oropharynx as per pre-operative assessment

## 2021-04-27 NOTE — Anesthesia Procedure Notes (Signed)
Central Venous Catheter Insertion Performed by: Belinda Block, MD, anesthesiologist Start/End12/12/2020 7:00 AM, 04/27/2021 7:15 AM Patient location: Pre-op. Preanesthetic checklist: patient identified, IV checked, site marked, risks and benefits discussed, surgical consent, monitors and equipment checked, pre-op evaluation, timeout performed and anesthesia consent Position: Trendelenburg Lidocaine 1% used for infiltration and patient sedated Hand hygiene performed , maximum sterile barriers used  and Seldinger technique used Catheter size: 8.5 Fr MAC introducer Procedure performed using ultrasound guided technique. Ultrasound Notes:anatomy identified, needle tip was noted to be adjacent to the nerve/plexus identified, no ultrasound evidence of intravascular and/or intraneural injection and image(s) printed for medical record Attempts: 1 Following insertion, line sutured and dressing applied. Post procedure assessment: blood return through all ports, free fluid flow and no air  Patient tolerated the procedure well with no immediate complications.

## 2021-04-27 NOTE — Anesthesia Procedure Notes (Signed)
Central Venous Catheter Insertion Performed by: Belinda Block, MD, anesthesiologist Start/End12/12/2020 7:00 AM, 04/27/2021 7:15 AM Patient location: Pre-op. Preanesthetic checklist: patient identified, IV checked, site marked, risks and benefits discussed, surgical consent, monitors and equipment checked, pre-op evaluation, timeout performed and anesthesia consent Lidocaine 1% used for infiltration and patient sedated Hand hygiene performed  and maximum sterile barriers used  PA cath was placed.Swan type:thermodilution Procedure performed without using ultrasound guided technique. Attempts: 1 Following insertion, line sutured, dressing applied and Biopatch. Post procedure assessment: blood return through all ports  Patient tolerated the procedure well with no immediate complications.

## 2021-04-27 NOTE — Anesthesia Procedure Notes (Signed)
Arterial Line Insertion Start/End12/12/2020 7:00 AM, 04/27/2021 7:20 AM Performed by: CRNA  Preanesthetic checklist: patient identified, IV checked, site marked, risks and benefits discussed, surgical consent, monitors and equipment checked, pre-op evaluation, timeout performed and anesthesia consent Lidocaine 1% used for infiltration and patient sedated Left, radial was placed Catheter size: 20 G Hand hygiene performed  and maximum sterile barriers used   Attempts: 2 Procedure performed without using ultrasound guided technique. Following insertion, Biopatch and dressing applied. Post procedure assessment: normal  Patient tolerated the procedure well with no immediate complications.

## 2021-04-27 NOTE — Plan of Care (Signed)

## 2021-04-27 NOTE — Progress Notes (Signed)
  Echocardiogram Echocardiogram Transesophageal has been performed.  [name] 04/27/2021, 9:14 AM

## 2021-04-27 NOTE — Transfer of Care (Signed)
Immediate Anesthesia Transfer of Care Note  Patient: [name]  Procedure(s) Performed: REDO AORTIC VALVE REPLACEMENT (AVR) USING ON-X 23MM AORTIC VALVE (Chest) TRANSESOPHAGEAL ECHOCARDIOGRAM (TEE) (Esophagus)  Patient Location: SICU  Anesthesia Type:General  Level of Consciousness: Patient remains intubated per anesthesia plan  Airway & Oxygen Therapy: Patient remains intubated per anesthesia plan  Post-op Assessment: Report given to RN and Post -op Vital signs reviewed and stable  Post vital signs: Reviewed and stable  Last Vitals:  Vitals Value Taken Time  BP 98/66 04/27/21 1447  Temp 34.2 C 04/27/21 1453  Pulse 89 04/27/21 1453  Resp 19 04/27/21 1453  SpO2 100 % 04/27/21 1453  Vitals shown include unvalidated device data.  Last Pain:  Vitals:   04/27/21 0606  TempSrc:   PainSc: 0-No pain      Patients Stated Pain Goal: 0 (07/31/79 1886)  Complications: No notable events documented.

## 2021-04-27 NOTE — Anesthesia Postprocedure Evaluation (Signed)
Anesthesia Post Note  Patient: CAPONE SCHWINN  Procedure(s) Performed: REDO AORTIC VALVE REPLACEMENT (AVR) USING ON-X 23MM AORTIC VALVE (Chest) TRANSESOPHAGEAL ECHOCARDIOGRAM (TEE) (Esophagus)     Patient location during evaluation: SICU Anesthesia Type: General Level of consciousness: patient remains intubated per anesthesia plan Pain management: pain level controlled Vital Signs Assessment: post-procedure vital signs reviewed and stable Respiratory status: patient remains intubated per anesthesia plan Postop Assessment: no apparent nausea or vomiting Anesthetic complications: no   No notable events documented.  Last Vitals:  Vitals:   04/27/21 1447 04/27/21 1500  BP: 98/66   Pulse: 90 89  Resp: 18 20  Temp:  (!) 34.1 C  SpO2: 100% 100%    Last Pain:  Vitals:   04/27/21 0606  TempSrc:   PainSc: 0-No pain                 Eleesha Purkey

## 2021-04-27 NOTE — Interval H&P Note (Signed)
History and Physical Interval Note:  04/27/2021 7:07 AM  [name]  has presented today for surgery, with the diagnosis of SEVERE AS.  The various methods of treatment have been discussed with the patient and family. After consideration of risks, benefits and other options for treatment, the patient has consented to  Procedure(s) with comments: REDO AORTIC VALVE REPLACEMENT (AVR) (N/A) - CIRC ARREST possible ASCENDING AORTIC ROOT REPLACEMENT (N/A) TRANSESOPHAGEAL ECHOCARDIOGRAM (TEE) (N/A) as a surgical intervention.  The patient's history has been reviewed, patient examined, no change in status, stable for surgery.  I have reviewed the patient's chart and labs.  Questions were answered to the patient's satisfaction.     Gaye Pollack

## 2021-04-27 NOTE — Hospital Course (Signed)
HPI: The patient is a 45 year old gentleman with a history of hypertension who underwent VSD repair at age 58 [address], [place]). He reports having severe aortic insufficiency at age 9 and underwent bioprosthetic aortic valve replacement ([place]) using a 25 mm Perimount. 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 He said that he was given a choice to have a mechanical or bioprosthetic valve and chose to have a bioprosthetic valve so would not have to be on Coumadin. He was subsequently found to have episodes of ventricular tachycardia in 2008 and underwent implantation of an ICD. He has been followed by Dr. Caryl Comes. He has had a few appropriate shocks for ventricular tachycardia over the years as well as ATP therapy with his most recent shock being yesterday. He said that this does not happen very often. He was evaluated at [facility] for a VT ablation but was found to have an epicardial source and it was not performed.  His echocardiograms have shown stable left ventricular systolic dysfunction with ejection fraction of 35 to 40%. In 2020 the mean gradient across the prosthetic valve was 43 mmHg. In 2021 the gradient was 33 mmHg. His most recent echo in September 2022 showed a mean gradient of 42 mmHg. There is no mitral regurgitation or stenosis. Left ventricular ejection fraction is 35 to 40% with global hypokinesis. He subsequently underwent cardiac catheterization on 03/01/2021. This showed single-vessel coronary disease with chronic total occlusion of the RCA with faint collaterals from the left. There is no other obstructive disease. The mean gradient across the prosthetic aortic valve was 57 mmHg with a peak to peak gradient of 74 mmHg. Valve area was calculated at 0.7 cm. Right heart pressures were normal.  He reports generalized fatigue and shortness of breath with exertion. He denies any chest pain or pressure. He has had some dizziness. He denies  peripheral edema. Review of his CTA of the chest shows some calcification of the ascending aorta extending from the sinotubular junction up to the takeoff of the innominate artery. This appears fairly localized along the right lateral surface of the aorta in a narrow band. It may be necessary to replace the ascending aorta to allow crossclamping but that decision will need to be made in the operating room. Dr. Cyndia Bent discussed the need for redo sternotomy, AV replacement (using a mechanical valve) and possible replacement of the ascending aorta. Potential risks, benefits, and complications of the surgery were discussed and the patient agreed to proceed with surgery.  Hospital Course:

## 2021-04-28 ENCOUNTER — Inpatient Hospital Stay (HOSPITAL_COMMUNITY): Payer: 59

## 2021-04-28 ENCOUNTER — Encounter (HOSPITAL_COMMUNITY): Payer: Self-pay | Admitting: Surgery

## 2021-04-28 LAB — BASIC METABOLIC PANEL
Anion gap: 9 (ref 5–15)
Anion gap: 8 (ref 5–15)
BUN: 11 mg/dL (ref 6–20)
BUN: 11 mg/dL (ref 6–20)
CO2: 26 mmol/L (ref 22–32)
CO2: 27 mmol/L (ref 22–32)
Calcium: 7.7 mg/dL — ABNORMAL LOW (ref 8.9–10.3)
Calcium: 8.1 mg/dL — ABNORMAL LOW (ref 8.9–10.3)
Chloride: 110 mmol/L (ref 98–111)
Chloride: 104 mmol/L (ref 98–111)
Creatinine, Ser: 1.26 mg/dL — ABNORMAL HIGH (ref 0.61–1.24)
Creatinine, Ser: 1.27 mg/dL — ABNORMAL HIGH (ref 0.61–1.24)
GFR, Estimated: >60 mL/min (ref >60)
GFR, Estimated: >60 mL/min (ref >60)
Glucose, Bld: 100 mg/dL — ABNORMAL HIGH (ref 70–99)
Glucose, Bld: 141 mg/dL — ABNORMAL HIGH (ref 70–99)
Potassium: 4.2 mmol/L (ref 3.5–5.1)
Potassium: 3.9 mmol/L (ref 3.5–5.1)
Sodium: 145 mmol/L (ref 135–145)
Sodium: 139 mmol/L (ref 135–145)

## 2021-04-28 LAB — PREPARE PLATELET PHERESIS
Unit division: 0
Unit division: 0

## 2021-04-28 LAB — BPAM PLATELET PHERESIS
Blood Product Expiration Date: 202212082359
Blood Product Expiration Date: 202212092359
ISSUE DATE / TIME: 202212081242
ISSUE DATE / TIME: 202212081242
Unit Type and Rh: 600
Unit Type and Rh: 6200

## 2021-04-28 LAB — SURGICAL PATHOLOGY

## 2021-04-28 LAB — PREPARE FRESH FROZEN PLASMA
Unit division: 0
Unit division: 0

## 2021-04-28 LAB — PREPARE CRYOPRECIPITATE
Unit division: 0
Unit division: 0

## 2021-04-28 LAB — POCT I-STAT 7, (LYTES, BLD GAS, ICA,H+H)
Acid-Base Excess: 0 mmol/L (ref 0.0–2.0)
Acid-base deficit: 1 mmol/L (ref 0.0–2.0)
Bicarbonate: 25 mmol/L (ref 20.0–28.0)
Bicarbonate: 25.9 mmol/L (ref 20.0–28.0)
Calcium, Ion: 1.14 mmol/L — ABNORMAL LOW (ref 1.15–1.40)
Calcium, Ion: 1.14 mmol/L — ABNORMAL LOW (ref 1.15–1.40)
HCT: 22 % — ABNORMAL LOW (ref 39.0–52.0)
HCT: 23 % — ABNORMAL LOW (ref 39.0–52.0)
Hemoglobin: 7.5 g/dL — ABNORMAL LOW (ref 13.0–17.0)
Hemoglobin: 7.8 g/dL — ABNORMAL LOW (ref 13.0–17.0)
O2 Saturation: 96 %
O2 Saturation: 98 %
Patient temperature: 37
Potassium: 4.1 mmol/L (ref 3.5–5.1)
Potassium: 4.2 mmol/L (ref 3.5–5.1)
Sodium: 146 mmol/L — ABNORMAL HIGH (ref 135–145)
Sodium: 146 mmol/L — ABNORMAL HIGH (ref 135–145)
TCO2: 26 mmol/L (ref 22–32)
TCO2: 27 mmol/L (ref 22–32)
pCO2 arterial: 47 mmHg (ref 32.0–48.0)
pCO2 arterial: 48.6 mmHg — ABNORMAL HIGH (ref 32.0–48.0)
pH, Arterial: 7.334 — ABNORMAL LOW (ref 7.350–7.450)
pH, Arterial: 7.334 — ABNORMAL LOW (ref 7.350–7.450)
pO2, Arterial: 92 mmHg (ref 83.0–108.0)
pO2, Arterial: 111 mmHg — ABNORMAL HIGH (ref 83.0–108.0)

## 2021-04-28 LAB — MAGNESIUM
Magnesium: 2.7 mg/dL — ABNORMAL HIGH (ref 1.7–2.4)
Magnesium: 2.4 mg/dL (ref 1.7–2.4)

## 2021-04-28 LAB — BPAM CRYOPRECIPITATE
Blood Product Expiration Date: 202212090025
Blood Product Expiration Date: 202212090025
ISSUE DATE / TIME: 202212081852
ISSUE DATE / TIME: 202212081852
Unit Type and Rh: 5100
Unit Type and Rh: 5100

## 2021-04-28 LAB — CBC
HCT: 22.8 % — ABNORMAL LOW (ref 39.0–52.0)
HCT: 24.5 % — ABNORMAL LOW (ref 39.0–52.0)
Hemoglobin: 7.6 g/dL — ABNORMAL LOW (ref 13.0–17.0)
Hemoglobin: 8.1 g/dL — ABNORMAL LOW (ref 13.0–17.0)
MCH: 30.4 pg (ref 26.0–34.0)
MCH: 30.3 pg (ref 26.0–34.0)
MCHC: 33.3 g/dL (ref 30.0–36.0)
MCHC: 33.1 g/dL (ref 30.0–36.0)
MCV: 91.2 fL (ref 80.0–100.0)
MCV: 91.8 fL (ref 80.0–100.0)
Platelets: 70 10*3/uL — ABNORMAL LOW (ref 150–400)
Platelets: 94 10*3/uL — ABNORMAL LOW (ref 150–400)
RBC: 2.5 MIL/uL — ABNORMAL LOW (ref 4.22–5.81)
RBC: 2.67 MIL/uL — ABNORMAL LOW (ref 4.22–5.81)
RDW: 11.6 % (ref 11.5–15.5)
RDW: 11.8 % (ref 11.5–15.5)
WBC: 7.8 10*3/uL (ref 4.0–10.5)
WBC: 14.5 10*3/uL — ABNORMAL HIGH (ref 4.0–10.5)
nRBC: 0 % (ref 0.0–0.2)
nRBC: 0 % (ref 0.0–0.2)

## 2021-04-28 LAB — BPAM FFP
Blood Product Expiration Date: 202212132359
Blood Product Expiration Date: 202212132359
ISSUE DATE / TIME: 202212081856
ISSUE DATE / TIME: 202212081856
Unit Type and Rh: 600
Unit Type and Rh: 6200

## 2021-04-28 LAB — GLUCOSE, CAPILLARY
Glucose-Capillary: 118 mg/dL — ABNORMAL HIGH (ref 70–99)
Glucose-Capillary: 100 mg/dL — ABNORMAL HIGH (ref 70–99)
Glucose-Capillary: 106 mg/dL — ABNORMAL HIGH (ref 70–99)
Glucose-Capillary: 98 mg/dL (ref 70–99)
Glucose-Capillary: 133 mg/dL — ABNORMAL HIGH (ref 70–99)
Glucose-Capillary: 103 mg/dL — ABNORMAL HIGH (ref 70–99)
Glucose-Capillary: 98 mg/dL (ref 70–99)
Glucose-Capillary: 116 mg/dL — ABNORMAL HIGH (ref 70–99)

## 2021-04-28 MED ORDER — INSULIN ASPART 100 UNIT/ML IJ SOLN: 0.0000 [IU] | INTRAMUSCULAR | Status: DC

## 2021-04-28 MED ORDER — INSULIN DETEMIR 100 UNIT/ML ~~LOC~~ SOLN
5.0000 [IU] | Freq: Two times a day (BID) | SUBCUTANEOUS | Status: DC
  Administered 2021-04-28 (×2): 5 [IU] via SUBCUTANEOUS
  Filled 2021-04-28 (×4): qty 0.05

## 2021-04-28 MED ORDER — SODIUM CHLORIDE 0.9 % IV SOLN
6.2500 mg | Freq: Four times a day (QID) | INTRAVENOUS | Status: DC | PRN
  Filled 2021-04-28 (×2): qty 0.25

## 2021-04-28 MED ORDER — FUROSEMIDE 10 MG/ML IJ SOLN
40.0000 mg | Freq: Once | INTRAMUSCULAR | Status: AC
  Administered 2021-04-28: 40 mg via INTRAVENOUS
  Filled 2021-04-28: qty 4

## 2021-04-28 MED ORDER — ASPIRIN EC 81 MG PO TBEC
81.0000 mg | DELAYED_RELEASE_TABLET | Freq: Every day | ORAL | Status: DC
  Administered 2021-04-28 – 2021-05-03 (×6): 81 mg via ORAL
  Filled 2021-04-28 (×6): qty 1

## 2021-04-28 MED ORDER — INSULIN DETEMIR 100 UNIT/ML ~~LOC~~ SOLN
5.0000 [IU] | Freq: Two times a day (BID) | SUBCUTANEOUS | Status: DC
  Filled 2021-04-28 (×2): qty 0.05

## 2021-04-28 MED ORDER — INSULIN ASPART 100 UNIT/ML IJ SOLN
2.0000 [IU] | INTRAMUSCULAR | Status: DC
Start: 2021-04-28 — End: 2021-04-28

## 2021-04-28 MED ORDER — METOCLOPRAMIDE HCL 5 MG/ML IJ SOLN
10.0000 mg | Freq: Four times a day (QID) | INTRAMUSCULAR | Status: AC
  Administered 2021-04-28 – 2021-04-29 (×4): 10 mg via INTRAVENOUS
  Filled 2021-04-28 (×4): qty 2

## 2021-04-28 MED ORDER — FE FUMARATE-B12-VIT C-FA-IFC PO CAPS
1.0000 | ORAL_CAPSULE | Freq: Every day | ORAL | Status: DC
  Administered 2021-04-28 – 2021-05-03 (×6): 1 via ORAL
  Filled 2021-04-28 (×6): qty 1

## 2021-04-28 MED ORDER — WARFARIN - PHYSICIAN DOSING INPATIENT: Freq: Every day | Status: DC

## 2021-04-28 MED ORDER — ASPIRIN 81 MG PO CHEW
81.0000 mg | CHEWABLE_TABLET | Freq: Every day | ORAL | Status: DC
  Filled 2021-04-28: qty 1

## 2021-04-28 MED ORDER — WARFARIN SODIUM 2 MG PO TABS
2.0000 mg | ORAL_TABLET | Freq: Once | ORAL | Status: AC
  Administered 2021-04-28: 2 mg via ORAL
  Filled 2021-04-28: qty 1

## 2021-04-28 MED ORDER — POTASSIUM CHLORIDE 10 MEQ/50ML IV SOLN
10.0000 meq | INTRAVENOUS | Status: AC
  Administered 2021-04-28 (×3): 10 meq via INTRAVENOUS
  Filled 2021-04-28 (×3): qty 50

## 2021-04-28 MED FILL — Thrombin (Recombinant) For Soln 20000 Unit: CUTANEOUS | Qty: 1 | Status: AC

## 2021-04-28 NOTE — Procedures (Signed)
Extubation Procedure Note  Patient Details:   Name: [name] DOB: 1975/06/29 MRN: [id number]   Airway Documentation:    Vent end date: 04/28/21 Vent end time: 0150   Evaluation  O2 sats: stable throughout Complications: No apparent complications Patient did tolerate procedure well. Bilateral Breath Sounds: Clear, Diminished   Yes  Pt tolerated wean, VC 1.8L, NIF -30, positive cuff leak, extubated to 4L [place]. No stridor or dyspnea noted post extubation.  Mariam Dollar 04/28/2021, 1:59 AM

## 2021-04-28 NOTE — Discharge Summary (Signed)
Physician Discharge Summary       [address]       [place] [address]             [phone]    Patient ID: [name] MRN: [id number] DOB/AGE: [birth year] 45 y.o.  Admit date: 04/27/2021 Discharge date: 05/03/2021  Admission Diagnoses: Severe prosthetic aortic valve stenosis and moderate insufficiency  Discharge Diagnoses:  S/p redo sternotomy (third time) for AVR (prosthetic) Expected blood loss anemia History of the following: Anxiety      Aortic valve replaced      Requiring replacement, specifics not available   Hearing loss of both ears 12/22/2015   Heart murmur     Hypertension     IBD (inflammatory bowel disease) 09/23/2015   ICD (implantable cardiac defibrillator), BSX single     Obesity 12/22/2015   Secondary cardiomyopathy ([place])     Syncope     Ventricular septal defect     Ventricular tachycardia      appropriate VT shock therapy /13     Consults: None  Procedure (s):  3rd time Redo Median Sternotomy Extracorporeal circulation 3.   Aortic valve replacement using a 23 mm ON-X  mechanical valve by Dr. Cyndia Bates on 04/27/2021.   History of Presenting Illness: he patient is a 45 year old gentleman with a history of hypertension who underwent VSD repair at age 2 [address] [place]). He reports having severe aortic insufficiency at age 28 and underwent bioprosthetic aortic valve replacement ([place]) using a 25 mm Perimount. 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 He said that he was given a choice to have a mechanical or bioprosthetic valve and chose to have a bioprosthetic valve so would not have to be on Coumadin. He was subsequently found to have episodes of ventricular tachycardia in 2008 and underwent implantation of an ICD. He has been followed by Dr. Caryl Bates. He has had a few appropriate shocks for ventricular tachycardia over the years as well as ATP therapy with his most recent shock  being yesterday. He said that this does not happen very often. He was evaluated at [facility] for a VT ablation but was found to have an epicardial source and it was not performed.  His echocardiograms have shown stable left ventricular systolic dysfunction with ejection fraction of 35 to 40%. In 2020 the mean gradient across the prosthetic valve was 43 mmHg. In 2021 the gradient was 33 mmHg. His most recent echo in September 2022 showed a mean gradient of 42 mmHg. There is no mitral regurgitation or stenosis. Left ventricular ejection fraction is 35 to 40% with global hypokinesis. He subsequently underwent cardiac catheterization on 03/01/2021. This showed single-vessel coronary disease with chronic total occlusion of the RCA with faint collaterals from the left. There is no other obstructive disease. The mean gradient across the prosthetic aortic valve was 57 mmHg with a peak to peak gradient of 74 mmHg. Valve area was calculated at 0.7 cm. Right heart pressures were normal.  He reports generalized fatigue and shortness of breath with exertion. He denies any chest pain or pressure. He has had some dizziness. He denies peripheral edema. Review of his CTA of the chest shows some calcification of the ascending aorta extending from the sinotubular junction up to the takeoff of the innominate artery. This appears fairly localized along the right lateral surface of the aorta in a narrow band. It may be necessary to replace the ascending  aorta to allow crossclamping but that decision will need to be made in the operating room. Dr. Cyndia Bates discussed the need for redo sternotomy, AV replacement (using a mechanical valve) and possible replacement of the ascending aorta. Potential risks, benefits, and complications of the surgery were discussed and the patient agreed to proceed with surgery.  Brief Hospital Course:  Mr. [name] was admitted for elective surgery on 04/27/2021.  He was taken to the operating room where redo  sternotomy was carried out and aortic valve replacement was performed using a 23 mm On-X mechanical valve.  Following the procedure, he separated from cardiopulmonary bypass without any difficulty and requiring no inotropic support.  He was transferred to the ICU in stable condition.  He remained hemodynamically stable.  He had a coagulopathy early postop that was treated with transfusions of fresh frozen plasma and cryoprecipitate.  He was weaned from the ventilator and extubated in the early morning hours of postop day 1.  The drainage tubes and monitoring lines were removed on the first postoperative day.  He had an expected acute blood loss anemia that was monitored carefully.  He was started on an iron supplement 3 times daily.  He was mobilized and tolerated this well.  He was transferred to 4 E. progressive care on postop day 2.  Diet and activity were advanced and well-tolerated.  On the third postoperative day, he developed frequent loose stools.  The stool softener was discontinued and he was given Imodium as needed.  He was anticoagulated following surgery Coumadin for his mechanical aortic valve.  The INR was monitored daily and Coumadin adjusted accordingly.  The pacer wires were removed on the third postoperative day 1 and the INR was at 1.5. he developed diarrhea. Stool softeners were stopped and he was on Reglan, which was also stopped. In time, diarrhea stopped. INR was 1.3 on 12/13 so Coumadin was increased to 5 mg daily. Coumadin was then increased to 7.5 mg daily as INR not increased much. INR on 12/14 went up to 1.8 (that was from 5 mg of Coumadin). As discussed with Dr. Cyndia Bates, will give 7.5 mg of Coumadin today prior to discharge. Patient will take 5 mg daily or as instructed beginning 12/15. INR appointment is on Friday 12/16. Also, as discussed with Dr. Cyndia Bates, will not restart Entresto yet and patient does not need to resume Ranexa or Spironolactone. Patient will have diuresis with  Torsemide (and a potassium supplement) for several days post op. Patient is surgically stable for discharge today.   Latest Vital Signs: Blood pressure 123/89, pulse 85, temperature 98.8 F (37.1 C), resp. rate 20, height 5\' 10"  (1.778 m), weight 90.6 kg, SpO2 100 %.  Physical Exam: Cardiovascular: RRR, sharp valve click Pulmonary: Clear to auscultation bilaterally Abdomen: Soft, non tender, bowel sounds present. Extremities: Trace bilateral lower extremity edema. Wound: Clean and dry.  No erythema or signs of infection.  Discharge Condition:Stable and discharged to home.  Recent laboratory studies:  Lab Results  Component Value Date   WBC 10.0 05/01/2021   HGB 7.5 (L) 05/01/2021   HCT 22.8 (L) 05/01/2021   MCV 89.8 05/01/2021   PLT 158 05/01/2021   Lab Results  Component Value Date   NA 138 05/02/2021   K 3.6 05/02/2021   CL 102 05/02/2021   CO2 29 05/02/2021   CREATININE 1.28 (H) 05/02/2021   GLUCOSE 99 05/02/2021      Diagnostic Studies: DG Chest 2 View  Result Date: 04/30/2021 CLINICAL DATA:  Recent aortic valve replacement EXAM: CHEST - 2 VIEW COMPARISON:  Prior chest x-ray 04/29/2021 FINDINGS: Stable cardiomegaly. Left subclavian approach cardiac rhythm maintenance device with the lead projecting over the right ventricle. Patient is status post median sternotomy with evidence of aortic valve replacement. Persistent decreased volume at the left lung base likely reflecting atelectasis. No pneumothorax. No pulmonary edema. No acute osseous abnormality. IMPRESSION: Persistent low lung volumes with persistent left basilar atelectasis. Slightly improved right basilar atelectasis. No acute abnormality. Electronically Signed   By: Jacqulynn Cadet M.D.   On: 04/30/2021 07:53   DG Chest 2 View  Result Date: 04/26/2021 CLINICAL DATA:  Preop for heart valve replacement. EXAM: CHEST - 2 VIEW COMPARISON:  09/05/2011. FINDINGS: The heart size and mediastinal contours are within  normal limits. Sternotomy wires are noted over the midline. There is a prosthetic cardiac valve. A single lead pacemaker device is present over the left chest. Both lungs are clear. No acute osseous abnormality. IMPRESSION: No active cardiopulmonary disease. Electronically Signed   By: Brett Fairy M.D.   On: 04/26/2021 01:56   DG Chest Port 1 View  Result Date: 04/29/2021 CLINICAL DATA:  Pleural effusion EXAM: PORTABLE CHEST 1 VIEW COMPARISON:  04/28/2021 FINDINGS: Right IJ Swan-Ganz catheter has been removed with cannula remaining. Stable cardiomegaly. Left chest wall ICD. Low lung volumes with bibasilar atelectasis. Probable small left pleural effusion. No pneumothorax. IMPRESSION: Bibasilar atelectasis and probable small left pleural effusion. Electronically Signed   By: Macy Mis M.D.   On: 04/29/2021 09:33   DG Chest Port 1 View  Result Date: 04/28/2021 CLINICAL DATA:  Cardiac surgery EXAM: PORTABLE CHEST 1 VIEW COMPARISON:  Previous studies including the examination of 04/27/2021 FINDINGS: Transverse diameter of heart is increased. There are no signs of pulmonary edema. There are linear densities in the left lower lung fields suggesting subsegmental atelectasis with interval worsening. Mediastinal drain is noted. Tip of Swan-Ganz catheter is seen in the region of main pulmonary artery. Left chest tube is noted. Pacemaker/defibrillator battery is seen in the left infraclavicular region. IMPRESSION: Cardiomegaly. There are linear densities in the left lower lung fields suggesting subsegmental atelectasis with interval worsening. Electronically Signed   By: Elmer Picker M.D.   On: 04/28/2021 09:04   DG Chest Port 1 View  Result Date: 04/27/2021 CLINICAL DATA:  Status post aortic valve replacement. EXAM: PORTABLE CHEST 1 VIEW COMPARISON:  Chest x-ray 04/25/2021. FINDINGS: Patient is status post recent cardiac surgery/aortic valve replacement. The cardiac silhouette is mildly enlarged,  new from the prior study. Swan-Ganz catheter tip projects over the main pulmonary artery. Endotracheal tube tip is 1.7 cm above the carina. Enteric tube tip is in the mid stomach. Mediastinal and left pleural drains are present. Left-sided pacemaker is unchanged in position. There is a band of atelectasis in the left lower lung. There is no pleural effusion or pneumothorax. IMPRESSION: 1. Endotracheal tube tip 1.7 cm above the carina. Consider repositioning. 2. Other lines/tubes are in adequate position. 3. Mild cardiac enlargement is new from prior. 4. Left base atelectasis. Electronically Signed   By: Ronney Asters M.D.   On: 04/27/2021 15:08   ECHO INTRAOPERATIVE TEE  Result Date: 04/27/2021  *INTRAOPERATIVE TRANSESOPHAGEAL REPORT *  Patient Name:   [name] Date of Exam: 04/27/2021 Medical Rec #:  [id number]      Height:       70.0 in Accession #:    [id number]     Weight:  194.0 lb Date of Birth:  1975/11/18      BSA:          2.06 m Patient Age:    29 years       BP:           116/84 mmHg Patient Gender: M              HR:           79 bpm. Exam Location:  Inpatient Transesophogeal exam was perform intraoperatively during surgical procedure. Patient was closely monitored under general anesthesia during the entirety of examination. Indications:     aortic stenosis and regurgitation. Performing Phys: [id number] Gaye Pollack Diagnosing Phys: Belinda Block MD POST-OP IMPRESSIONS _ Comments: Post Bypass, Patient came off bypass on the initial attempt for AVR. The valve appeared to be seated well with no obstruction. Left ventricular contraction did improve with volume replacement. There did not appear to be any new findings from preop exam. The TEE that had been placed after induction without difficulty was removed at the end of the case uneventfully. The patient was later taken to the SICU in stable condition. PRE-OP FINDINGS  Left Ventricle: The left ventricle has moderate-severely reduced systolic  function, with an ejection fraction of 30-35%. The cavity size was mildly dilated. There is mild left ventricular hypertrophy. Right Ventricle: The right ventricle has normal systolic function. Left Atrium: No left atrial/left atrial appendage thrombus was detected. Mitral Valve: The mitral valve is normal in structure. Mitral valve regurgitation is trivial by color flow Doppler. Tricuspid Valve: The tricuspid valve was normal in structure. Aortic Valve: The aortic valve has been repaired/replaced Aortic valve regurgitation is moderate by color flow Doppler. There is severe stenosis of the aortic valve. Pulmonic Valve: The pulmonic valve was normal in structure.  +------------------+------------++ AORTIC VALVE                   +------------------+------------++ AV Vmax:          341.67 cm/s  +------------------+------------++ AV Vmean:         259.700 cm/s +------------------+------------++ AV VTI:           0.948 m      +------------------+------------++ AV Peak Grad:     46.7 mmHg    +------------------+------------++ AV Mean Grad:     35.3 mmHg    +------------------+------------++ LVOT Vmax:        64.50 cm/s   +------------------+------------++ LVOT Vmean:       43.700 cm/s  +------------------+------------++ LVOT VTI:         0.166 m      +------------------+------------++ LVOT/AV VTI ratio:0.18         +------------------+------------++ AR PHT:           399 msec     +------------------+------------++  +-------------+------+ SHUNTS              +-------------+------+ Systemic VTI:0.17 m +-------------+------+  Belinda Block MD Electronically signed by Belinda Block MD Signature Date/Time: 04/27/2021/5:08:33 PM    Final    VAS US DOPPLER PRE CABG  Result Date: 04/25/2021 PREOPERATIVE VASCULAR EVALUATION Patient Name:  [name]  Date of Exam:   04/25/2021 Medical Rec #: [id number]       Accession #:    [id number] Date of Birth: 03/27/1976       Patient  Gender: M Patient Age:   69 years Exam Location:  [facility] Procedure:      VAS US  DOPPLER PRE CABG Referring Phys: BRYAN BARTLE --------------------------------------------------------------------------------  Indications:            Pre-op AVR. Risk Factors:           Hypertension. Vascular Interventions: CHF, ICD, HX VSD repair, HX of AVR (2011). Comparison Study:       No previous exams Performing Technologist: Jody Hill RVT, RDMS  Examination Guidelines: A complete evaluation includes B-mode imaging, spectral Doppler, color Doppler, and power Doppler as needed of all accessible portions of each vessel. Bilateral testing is considered an integral part of a complete examination. Limited examinations for reoccurring indications may be performed as noted.  Right Carotid Findings: +----------+--------+--------+--------+--------+------------------+           PSV cm/sEDV cm/sStenosisDescribeComments           +----------+--------+--------+--------+--------+------------------+ CCA Prox  81      26                      intimal thickening +----------+--------+--------+--------+--------+------------------+ CCA Distal71      31                      intimal thickening +----------+--------+--------+--------+--------+------------------+ ICA Prox  50      19                                         +----------+--------+--------+--------+--------+------------------+ ICA Distal60      32                                         +----------+--------+--------+--------+--------+------------------+ ECA       45      11                                         +----------+--------+--------+--------+--------+------------------+ +----------+--------+-------+----------------+------------+           PSV cm/sEDV cmsDescribe        Arm Pressure +----------+--------+-------+----------------+------------+ [id number]             Multiphasic, WNL              +----------+--------+-------+----------------+------------+ +---------+--------+--+--------+--+---------+ VertebralPSV cm/s41EDV cm/s18Antegrade +---------+--------+--+--------+--+---------+ Left Carotid Findings: +----------+--------+--------+--------+--------+------------------+           PSV cm/sEDV cm/sStenosisDescribeComments           +----------+--------+--------+--------+--------+------------------+ CCA Prox  94      22                                         +----------+--------+--------+--------+--------+------------------+ CCA Distal67      20                      intimal thickening +----------+--------+--------+--------+--------+------------------+ ICA Prox  46      24                                         +----------+--------+--------+--------+--------+------------------+ ICA Distal69      30                                         +----------+--------+--------+--------+--------+------------------+  ECA       43      7                                          +----------+--------+--------+--------+--------+------------------+ +----------+--------+--------+----------------+------------+ SubclavianPSV cm/sEDV cm/sDescribe        Arm Pressure +----------+--------+--------+----------------+------------+           92              Multiphasic, WNL             +----------+--------+--------+----------------+------------+ +---------+--------+--+--------+--+---------+ VertebralPSV cm/s42EDV cm/s17Antegrade +---------+--------+--+--------+--+---------+  ABI Findings: +--------+------------------+-----+---------+--------+ Right   Rt Pressure (mmHg)IndexWaveform Comment  +--------+------------------+-----+---------+--------+ [id number]                    triphasic         +--------+------------------+-----+---------+--------+ +--------+------------------+-----+---------+-------+ Left    Lt Pressure (mmHg)IndexWaveform Comment  +--------+------------------+-----+---------+-------+ [id number]                    triphasic        +--------+------------------+-----+---------+-------+  Right Doppler Findings: +--------+--------+-----+---------+--------+ Site    PressureIndexDoppler  Comments +--------+--------+-----+---------+--------+ [id number]          triphasic         +--------+--------+-----+---------+--------+ Radial               biphasic          +--------+--------+-----+---------+--------+ Ulnar                biphasic          +--------+--------+-----+---------+--------+  Left Doppler Findings: +--------+--------+-----+---------+--------+ Site    PressureIndexDoppler  Comments +--------+--------+-----+---------+--------+ [id number]          triphasic         +--------+--------+-----+---------+--------+ Radial               triphasic         +--------+--------+-----+---------+--------+ Ulnar                triphasic         +--------+--------+-----+---------+--------+  Summary: Right Carotid: The extracranial vessels were near-normal with only minimal wall                thickening or plaque. Left Carotid: The extracranial vessels were near-normal with only minimal wall               thickening or plaque. Vertebrals:  Bilateral vertebral arteries demonstrate antegrade flow. Subclavians: Normal flow hemodynamics were seen in bilateral subclavian              arteries. Right Upper Extremity: Doppler waveform obliterate with right radial compression. Doppler waveforms remain within normal limits with right ulnar compression. Left Upper Extremity: Doppler waveforms remain within normal limits with left radial compression. Doppler waveforms decrease >50% with left ulnar compression.  Electronically signed by Deitra Mayo MD on 04/25/2021 at 6:32:46 PM.    Final    Discharge Instructions     Amb Referral to Cardiac Rehabilitation   Complete by: As directed    Diagnosis:  Valve Replacement   Valve: Aortic   After initial evaluation and assessments completed: Virtual Based Care may be provided alone or in conjunction with Phase 2 Cardiac Rehab based on patient barriers.: Yes       Discharge Medications: Allergies as of 05/03/2021       Reactions   Shellfish Allergy Nausea And Vomiting  Medication List     STOP taking these medications    Entresto 49-51 MG Generic drug: sacubitril-valsartan   Incruse Ellipta 62.5 MCG/ACT Aepb Generic drug: umeclidinium bromide   ranolazine 1000 MG SR tablet Commonly known as: RANEXA   spironolactone 25 MG tablet Commonly known as: ALDACTONE       TAKE these medications    acetaminophen 500 MG tablet Commonly known as: TYLENOL Take 500 mg by mouth every 6 (six) hours as needed for moderate pain.   aspirin 81 MG EC tablet Take 1 tablet (81 mg total) by mouth daily. Swallow whole.   carvedilol 6.25 MG tablet Commonly known as: COREG Take 1 tablet (6.25 mg total) by mouth 2 (two) times daily. What changed:  medication strength how much to take   fluticasone 50 MCG/ACT nasal spray Commonly known as: FLONASE Place 2 sprays into both nostrils daily as needed for allergies.   magnesium oxide 400 (241.3 Mg) MG tablet Commonly known as: MAG-OX Take 1 tablet (400 mg total) by mouth 2 (two) times daily.   meclizine 25 MG tablet Commonly known as: ANTIVERT Take 0.5-1 tablets (12.5-25 mg total) by mouth 3 (three) times daily as needed for dizziness.   multivitamin tablet Take 2 tablets by mouth daily.   oxyCODONE 5 MG immediate release tablet Commonly known as: Oxy IR/ROXICODONE Take 1 tablet (5 mg total) by mouth every 4 (four) hours as needed for severe pain.   potassium chloride SA 20 MEQ tablet Commonly known as: KLOR-CON M Take 1 tablet (20 mEq total) by mouth daily. For 4 days then stop.   torsemide 20 MG tablet Commonly known as: DEMADEX Take 1 tablet (20 mg total) by mouth  daily. For 4 days then stop.   triamcinolone cream 0.1 % Commonly known as: KENALOG Apply 1 application topically 2 (two) times daily as needed.   warfarin 5 MG tablet Commonly known as: COUMADIN Take 1 tablet (5 mg total) by mouth daily at 4 PM. Or as directed.       The patient has been discharged on:   1.Beta Blocker:  Yes [ x  ]                              No   [   ]                              If No, reason:  2.Ace Inhibitor/ARB: Yes [   ]                                     No  [  x  ]                                     If No, reason:Will try to restart Entresto after discharge when BP allows  3.Statin:   Yes [   ]                  No  [   x]                  If No, reason:No CAD  4.Shela CommonsVelta Addison  [ x  ]  No   [   ]                  If No, reason:  Patient had ACS upon admission:NO  Plavix/P2Y12 inhibitor: Yes [   ]                                      No  [  x ]   Follow Up Appointments:  Follow-up Information     Sherren Mocha, MD. Go on 06/06/2021.   Specialty: Cardiology Why: Appointment time is at 9:20 am Contact information: [address] [place] [address] [phone]         Gaye Pollack, MD. Go on 05/31/2021.   Specialty: Cardiothoracic Surgery Why: PA/LAT CXR to be taken (at [facility] which is in the same building as Dr. Vivi Martens office) on 01/11 at 3:30 pm;Appointment time is at 4:00 pm Contact information: [address] [place] [address] [phone]         [facility] Follow up.   Specialty: Cardiology Why: you are scheduled for echo of your heart 06/07/20 at 1015 AM Contact information: [address] [place]. Go on 05/16/2021.   Specialty: Cardiothoracic Surgery Why: Appointment is with nurse for chest tube suture  removal. Appointment time is at 11:00 am Contact information: [place] [place] Office Follow up.   Specialty: Cardiology Why: Appointment is to have PT/INR ([facility])  (on Coumadin for On X mechanical aortic valve) drawn on 12/16  at 2:30 pm Contact information: [address], [place] [phone]                Signed: [place] 05/03/2021, 8:51 AM

## 2021-04-28 NOTE — Progress Notes (Signed)
Patient c/o continued discomfort from foley catheter.  I had placed it in foley anchor, however upon entering room patient had removed it and tubing was on floor.  He still c/o of pain and discomfort; foley is patent and draining urine at this time.  Patient wanted it removed so I removed it and supplied urinal for patient to use.

## 2021-04-28 NOTE — Progress Notes (Addendum)
TCTS DAILY ICU PROGRESS NOTE                   [address]            [place] [address]          [phone]   1 Day Post-Op Procedure(s) (LRB): REDO AORTIC VALVE REPLACEMENT (AVR) USING ON-X 23MM AORTIC VALVE (N/A) TRANSESOPHAGEAL ECHOCARDIOGRAM (TEE) (N/A)  Total Length of Stay:  LOS: 1 day   Subjective: Patient having EKG taken this am. He has some incisional pain. He denies nausea.  Objective: Vital signs in last 24 hours: Temp:  [93.4 F (34.1 C)-99.9 F (37.7 C)] 99.9 F (37.7 C) (12/09 0600) Pulse Rate:  [78-96] 96 (12/09 0600) Cardiac Rhythm: Atrial paced (12/08 1600) Resp:  [0-27] 16 (12/09 0600) BP: (87-116)/(42-80) 96/72 (12/09 0600) SpO2:  [98 %-100 %] 98 % (12/09 0600) Arterial Line BP: (65-140)/(44-88) 110/58 (12/09 0600) FiO2 (%):  [40 %-50 %] 40 % (12/09 0110) Weight:  [96.3 kg] 96.3 kg (12/09 0500)  Filed Weights   04/27/21 0601 04/28/21 0500  Weight: 88 kg 96.3 kg    Weight change: 8.302 kg   Hemodynamic parameters for last 24 hours: PAP: (20-43)/(9-25) 35/16 CVP:  [13 mmHg] 13 mmHg CO:  [2.6 L/min-5.9 L/min] 5.9 L/min CI:  [1.3 L/min/m2-2.9 L/min/m2] 2.9 L/min/m2  Intake/Output from previous day: 12/08 0701 - 12/09 0700 In: 7408.3 [I.V.:4046.2; Blood:1923; IV Piggyback:1439.1] Out: 68341 [Urine:9480; Emesis/NG output:20; Blood:985; Chest Tube:502]  Intake/Output this shift: No intake/output data recorded.  Current Meds: Scheduled Meds:  acetaminophen  1,000 mg Oral Q6H   Or   acetaminophen (TYLENOL) oral liquid 160 mg/5 mL  1,000 mg Per Tube Q6H   aspirin EC  325 mg Oral Daily   Or   aspirin  324 mg Per Tube Daily   bisacodyl  10 mg Oral Daily   Or   bisacodyl  10 mg Rectal Daily   chlorhexidine gluconate (MEDLINE KIT)  15 mL Mouth Rinse BID   Chlorhexidine Gluconate Cloth  6 each Topical Daily   docusate sodium  200 mg Oral Daily   insulin aspart  2-6 Units Subcutaneous Q4H   insulin detemir  5 Units  Subcutaneous Q12H   mouth rinse  15 mL Mouth Rinse 10 times per day   metoprolol tartrate  12.5 mg Oral BID   Or   metoprolol tartrate  12.5 mg Per Tube BID   [START ON 04/29/2021] pantoprazole  40 mg Oral Daily   sodium chloride flush  3 mL Intravenous Q12H   Continuous Infusions:  sodium chloride Stopped (04/27/21 1955)   sodium chloride 250 mL (04/27/21 2154)   sodium chloride 10 mL/hr at 04/27/21 1516    ceFAZolin (ANCEF) IV 2 g (04/28/21 0645)   dexmedetomidine (PRECEDEX) IV infusion Stopped (04/27/21 1749)   insulin Stopped (04/28/21 0315)   lactated ringers     lactated ringers     lactated ringers Stopped (04/28/21 0310)   nitroGLYCERIN 0 mcg/min (04/27/21 1440)   norepinephrine (LEVOPHED) Adult infusion 0 mcg/min (04/27/21 1440)   phenylephrine (NEO-SYNEPHRINE) Adult infusion Stopped (04/28/21 0000)   PRN Meds:.sodium chloride, dextrose, lactated ringers, metoprolol tartrate, midazolam, morphine injection, ondansetron (ZOFRAN) IV, oxyCODONE, sodium chloride flush, traMADol  General appearance: alert, cooperative, and no distress Neurologic: intact Heart: RRR, no murmur, valve click Lungs: Diminished bibasilar breath sounds Abdomen: Soft, non tender, sporadic bowel sounds Extremities: SCDs in place Wound: Sternal dressing dry and intact  Lab Results:  CBC: Recent Labs    04/27/21 2312 04/27/21 2322 04/28/21 0444 04/28/21 0500  WBC 7.6  --  7.8  --   HGB 7.0*   < > 7.6* 7.8*  HCT 20.3*   < > 22.8* 23.0*  PLT 64*  --  70*  --    < > = values in this interval not displayed.   BMET:  Recent Labs    04/27/21 2312 04/27/21 2322 04/28/21 0444 04/28/21 0500  NA 141   < > 145 146*  K 4.1   < > 4.2 4.2  CL 109  --  110  --   CO2 25  --  26  --   GLUCOSE 145*  --  100*  --   BUN 12  --  11  --   CREATININE 1.18  --  1.26*  --   CALCIUM 7.4*  --  7.7*  --    < > = values in this interval not displayed.    CMET: Lab Results  Component Value Date   WBC 7.8  04/28/2021   HGB 7.8 (L) 04/28/2021   HCT 23.0 (L) 04/28/2021   PLT 70 (L) 04/28/2021   GLUCOSE 100 (H) 04/28/2021   CHOL 156 07/12/2020   TRIG 110 07/12/2020   HDL 38 (L) 07/12/2020   LDLCALC 98 07/12/2020   ALT 15 04/25/2021   AST 34 04/25/2021   NA 146 (H) 04/28/2021   K 4.2 04/28/2021   CL 110 04/28/2021   CREATININE 1.26 (H) 04/28/2021   BUN 11 04/28/2021   CO2 26 04/28/2021   TSH 2.19 02/13/2018   PSA 0.73 02/17/2020   INR 2.0 (H) 04/27/2021   HGBA1C 4.5 (L) 04/25/2021   MICROALBUR 0.5 02/16/2019      PT/INR:  Recent Labs    04/27/21 1507  LABPROT 23.0*  INR 2.0*   Radiology: [facility] Chest Port 1 View  Result Date: 04/27/2021 CLINICAL DATA:  Status post aortic valve replacement. EXAM: PORTABLE CHEST 1 VIEW COMPARISON:  Chest x-ray 04/25/2021. FINDINGS: Patient is status post recent cardiac surgery/aortic valve replacement. The cardiac silhouette is mildly enlarged, new from the prior study. Swan-Ganz catheter tip projects over the main pulmonary artery. Endotracheal tube tip is 1.7 cm above the carina. Enteric tube tip is in the mid stomach. Mediastinal and left pleural drains are present. Left-sided pacemaker is unchanged in position. There is a band of atelectasis in the left lower lung. There is no pleural effusion or pneumothorax. IMPRESSION: 1. Endotracheal tube tip 1.7 cm above the carina. Consider repositioning. 2. Other lines/tubes are in adequate position. 3. Mild cardiac enlargement is new from prior. 4. Left base atelectasis. Electronically Signed   By: Ronney Asters M.D.   On: 04/27/2021 15:08   ECHO INTRAOPERATIVE TEE  Result Date: 04/27/2021  *INTRAOPERATIVE TRANSESOPHAGEAL REPORT *  Patient Name:   [name] Date of Exam: 04/27/2021 Medical Rec #:  [id number]      Height:       70.0 in Accession #:    [id number]     Weight:       194.0 lb Date of Birth:  1976-04-03      BSA:          2.06 m Patient Age:    45 years       BP:           116/84 mmHg Patient  Gender: M  HR:           79 bpm. Exam Location:  Inpatient Transesophogeal exam was perform intraoperatively during surgical procedure. Patient was closely monitored under general anesthesia during the entirety of examination. Indications:     aortic stenosis and regurgitation. Performing Phys: [id number] Gaye Pollack Diagnosing Phys: Belinda Block MD POST-OP IMPRESSIONS _ Comments: Post Bypass, Patient came off bypass on the initial attempt for AVR. The valve appeared to be seated well with no obstruction. Left ventricular contraction did improve with volume replacement. There did not appear to be any new findings from preop exam. The TEE that had been placed after induction without difficulty was removed at the end of the case uneventfully. The patient was later taken to the SICU in stable condition. PRE-OP FINDINGS  Left Ventricle: The left ventricle has moderate-severely reduced systolic function, with an ejection fraction of 30-35%. The cavity size was mildly dilated. There is mild left ventricular hypertrophy. Right Ventricle: The right ventricle has normal systolic function. Left Atrium: No left atrial/left atrial appendage thrombus was detected. Mitral Valve: The mitral valve is normal in structure. Mitral valve regurgitation is trivial by color flow Doppler. Tricuspid Valve: The tricuspid valve was normal in structure. Aortic Valve: The aortic valve has been repaired/replaced Aortic valve regurgitation is moderate by color flow Doppler. There is severe stenosis of the aortic valve. Pulmonic Valve: The pulmonic valve was normal in structure.  +------------------+------------++ AORTIC VALVE                   +------------------+------------++ AV Vmax:          341.67 cm/s  +------------------+------------++ AV Vmean:         259.700 cm/s +------------------+------------++ AV VTI:           0.948 m      +------------------+------------++ AV Peak Grad:     46.7 mmHg     +------------------+------------++ AV Mean Grad:     35.3 mmHg    +------------------+------------++ LVOT Vmax:        64.50 cm/s   +------------------+------------++ LVOT Vmean:       43.700 cm/s  +------------------+------------++ LVOT VTI:         0.166 m      +------------------+------------++ LVOT/AV VTI ratio:0.18         +------------------+------------++ AR PHT:           399 msec     +------------------+------------++  +-------------+------+ SHUNTS              +-------------+------+ Systemic VTI:0.17 m +-------------+------+  Belinda Block MD Electronically signed by Belinda Block MD Signature Date/Time: 04/27/2021/5:08:33 PM    Final     ECG: sinus, incomplete LBBB ( new)  CXR: low lung volume with bibasilar atelectasis. Assessment/Plan: S/P Procedure(s) (LRB): REDO AORTIC VALVE REPLACEMENT (AVR) USING ON-X 23MM AORTIC VALVE (N/A) TRANSESOPHAGEAL ECHOCARDIOGRAM (TEE) (N/A) CV-CO/CI5.9/2.9. SR with HR in the high 90's this am. On Lopressor 12.5 mg bid. Likely start Coumadin and decrease ec asa. EKG being taken Pulmonary-On oxygen this am via [place]. Will wean over next few days. Chest tubes with 502 cc since surgery. CXR appears to show cardiomegaly, ?trace right apical ptx. Encourage incentive spirometer Volume overload Expected post op blood loss anemia-H and H this am stable at 7.6 and 22.8. Start Trinsion and monitor need for transfusion CBGs 106/100/98. Pre op HGA1C 4.5. Will transition off Insulin drip. Will stop accu checks and SS in a few days. 6. Creatinine this am 1.26.Monitor 7.  Remove Swan, a line. Please see progression orders    Donielle Liston Alba PA-c 04/28/2021 7:05 AM    Chart reviewed, patient examined, agree with above. He looks good overall. Will dangle again and remove all chest tubes. Will need to make sure ICD is turned back on .

## 2021-04-28 NOTE — Progress Notes (Signed)
      [address]       ,[place] [address]             [phone]      POD # 1 redo AVR  Sleeping currently  BP 119/76   Pulse 95   Temp 97.8 F (36.6 C) (Oral)   Resp (!) 23   Ht 5\' 10"  (1.778 m)   Wt 96.3 kg   SpO2 100%   BMI 30.46 kg/m  2L [place] 100% sat   Intake/Output Summary (Last 24 hours) at 04/28/2021 1839 Last data filed at 04/28/2021 1446 Gross per 24 hour  Intake 2349.24 ml  Output 3825 ml  Net -1475.76 ml   Creatinine 1.27 stable K= 3.9 Hct 24 PLT 94K up from 70K  Doing well POD # 1 Supplement K  Remo Lipps C. Roxan Hockey, MD [facility] [phone]

## 2021-04-29 ENCOUNTER — Inpatient Hospital Stay (HOSPITAL_COMMUNITY): Payer: 59

## 2021-04-29 LAB — CBC
HCT: 24.1 % — ABNORMAL LOW (ref 39.0–52.0)
Hemoglobin: 7.9 g/dL — ABNORMAL LOW (ref 13.0–17.0)
MCH: 30.3 pg (ref 26.0–34.0)
MCHC: 32.8 g/dL (ref 30.0–36.0)
MCV: 92.3 fL (ref 80.0–100.0)
Platelets: 89 10*3/uL — ABNORMAL LOW (ref 150–400)
RBC: 2.61 MIL/uL — ABNORMAL LOW (ref 4.22–5.81)
RDW: 11.7 % (ref 11.5–15.5)
WBC: 13.3 10*3/uL — ABNORMAL HIGH (ref 4.0–10.5)
nRBC: 0 % (ref 0.0–0.2)

## 2021-04-29 LAB — BASIC METABOLIC PANEL
Anion gap: 7 (ref 5–15)
BUN: 10 mg/dL (ref 6–20)
CO2: 29 mmol/L (ref 22–32)
Calcium: 8.2 mg/dL — ABNORMAL LOW (ref 8.9–10.3)
Chloride: 100 mmol/L (ref 98–111)
Creatinine, Ser: 1.14 mg/dL (ref 0.61–1.24)
GFR, Estimated: >60 mL/min (ref >60)
Glucose, Bld: 118 mg/dL — ABNORMAL HIGH (ref 70–99)
Potassium: 3.8 mmol/L (ref 3.5–5.1)
Sodium: 136 mmol/L (ref 135–145)

## 2021-04-29 LAB — GLUCOSE, CAPILLARY
Glucose-Capillary: 103 mg/dL — ABNORMAL HIGH (ref 70–99)
Glucose-Capillary: 106 mg/dL — ABNORMAL HIGH (ref 70–99)
Glucose-Capillary: 108 mg/dL — ABNORMAL HIGH (ref 70–99)
Glucose-Capillary: 79 mg/dL (ref 70–99)
Glucose-Capillary: 93 mg/dL (ref 70–99)
Glucose-Capillary: 96 mg/dL (ref 70–99)

## 2021-04-29 LAB — PROTIME-INR
INR: 1.8 — ABNORMAL HIGH (ref 0.8–1.2)
Prothrombin Time: 20.6 seconds — ABNORMAL HIGH (ref 11.4–15.2)

## 2021-04-29 MED ORDER — MECLIZINE HCL 12.5 MG PO TABS
12.5000 mg | ORAL_TABLET | Freq: Three times a day (TID) | ORAL | Status: DC | PRN
  Filled 2021-04-29: qty 2

## 2021-04-29 MED ORDER — SODIUM CHLORIDE 0.9% FLUSH: 3.0000 mL | INTRAVENOUS | Status: DC | PRN

## 2021-04-29 MED ORDER — MAGNESIUM OXIDE -MG SUPPLEMENT 400 (240 MG) MG PO TABS
400.0000 mg | ORAL_TABLET | Freq: Two times a day (BID) | ORAL | Status: DC
  Administered 2021-04-29 – 2021-05-03 (×9): 400 mg via ORAL
  Filled 2021-04-29 (×9): qty 1

## 2021-04-29 MED ORDER — ALUM & MAG HYDROXIDE-SIMETH 200-200-20 MG/5ML PO SUSP: 15.0000 mL | Freq: Four times a day (QID) | ORAL | Status: DC | PRN

## 2021-04-29 MED ORDER — ALPRAZOLAM 0.5 MG PO TABS
0.5000 mg | ORAL_TABLET | Freq: Two times a day (BID) | ORAL | Status: DC | PRN
  Administered 2021-04-29: 0.5 mg via ORAL
  Filled 2021-04-29: qty 1

## 2021-04-29 MED ORDER — WARFARIN SODIUM 2 MG PO TABS
2.0000 mg | ORAL_TABLET | Freq: Every day | ORAL | Status: AC
  Administered 2021-04-29: 2 mg via ORAL
  Filled 2021-04-29: qty 1

## 2021-04-29 MED ORDER — CARVEDILOL 6.25 MG PO TABS
6.2500 mg | ORAL_TABLET | Freq: Two times a day (BID) | ORAL | Status: DC
  Administered 2021-04-29 – 2021-05-03 (×9): 6.25 mg via ORAL
  Filled 2021-04-29 (×9): qty 1

## 2021-04-29 MED ORDER — INSULIN ASPART 100 UNIT/ML IJ SOLN: 0.0000 [IU] | Freq: Three times a day (TID) | INTRAMUSCULAR | Status: DC

## 2021-04-29 MED ORDER — MAGNESIUM HYDROXIDE 400 MG/5ML PO SUSP: 30.0000 mL | Freq: Every day | ORAL | Status: DC | PRN

## 2021-04-29 MED ORDER — ~~LOC~~ CARDIAC SURGERY, PATIENT & FAMILY EDUCATION: Freq: Once | Status: AC

## 2021-04-29 MED ORDER — SODIUM CHLORIDE 0.9 % IV SOLN: 250.0000 mL | INTRAVENOUS | Status: DC | PRN

## 2021-04-29 MED ORDER — FUROSEMIDE 40 MG PO TABS
40.0000 mg | ORAL_TABLET | Freq: Every day | ORAL | Status: DC
  Administered 2021-04-29 – 2021-04-30 (×2): 40 mg via ORAL
  Filled 2021-04-29 (×2): qty 1

## 2021-04-29 MED ORDER — SODIUM CHLORIDE 0.9% FLUSH
3.0000 mL | Freq: Two times a day (BID) | INTRAVENOUS | Status: DC
  Administered 2021-04-29 – 2021-05-02 (×7): 3 mL via INTRAVENOUS

## 2021-04-29 MED ORDER — POTASSIUM CHLORIDE ER 10 MEQ PO TBCR
20.0000 meq | EXTENDED_RELEASE_TABLET | Freq: Three times a day (TID) | ORAL | Status: AC
  Administered 2021-04-29 (×3): 20 meq via ORAL
  Filled 2021-04-29 (×6): qty 2

## 2021-04-29 NOTE — Progress Notes (Signed)
Patient ambulated 312ft slow pace but tolerated adequately, required 4L O2 during ambulation.

## 2021-04-29 NOTE — Progress Notes (Signed)
2 Days Post-Op Procedure(s) (LRB): REDO AORTIC VALVE REPLACEMENT (AVR) USING ON-X 23MM AORTIC VALVE (N/A) TRANSESOPHAGEAL ECHOCARDIOGRAM (TEE) (N/A) Subjective: Anxious, some incisional pain  Objective: Vital signs in last 24 hours: Temp:  [97.8 F (36.6 C)-98.9 F (37.2 C)] 98.9 F (37.2 C) (12/10 0825) Pulse Rate:  [84-109] 93 (12/10 0800) Cardiac Rhythm: Normal sinus rhythm (12/09 2000) Resp:  [21-34] 27 (12/10 0800) BP: (93-143)/(66-81) 128/75 (12/10 0800) SpO2:  [94 %-100 %] 100 % (12/10 0800)  Hemodynamic parameters for last 24 hours:    Intake/Output from previous day: 12/09 0701 - 12/10 0700 In: 1335.1 [P.O.:1050; IV Piggyback:285.1] Out: 3200 [Urine:3150; Chest Tube:50] Intake/Output this shift: No intake/output data recorded.  General appearance: alert, cooperative, and no distress Neurologic: intact Heart: regular rate and rhythm and + click Lungs: diminished breath sounds bibasilar Abdomen: mildly distended, nontender  Lab Results: Recent Labs    04/28/21 1707 04/29/21 0314  WBC 14.5* 13.3*  HGB 8.1* 7.9*  HCT 24.5* 24.1*  PLT 94* 89*   BMET:  Recent Labs    04/28/21 1707 04/29/21 0314  NA 139 136  K 3.9 3.8  CL 104 100  CO2 27 29  GLUCOSE 141* 118*  BUN 11 10  CREATININE 1.27* 1.14  CALCIUM 8.1* 8.2*    PT/INR:  Recent Labs    04/29/21 0314  LABPROT 20.6*  INR 1.8*   ABG    Component Value Date/Time   PHART 7.334 (L) 04/28/2021 0500   HCO3 25.9 04/28/2021 0500   TCO2 27 04/28/2021 0500   ACIDBASEDEF 1.0 04/28/2021 0143   O2SAT 98.0 04/28/2021 0500   CBG (last 3)  Recent Labs    04/29/21 0026 04/29/21 0318 04/29/21 0827  GLUCAP 103* 106* 96    Assessment/Plan: S/P Procedure(s) (LRB): REDO AORTIC VALVE REPLACEMENT (AVR) USING ON-X 23MM AORTIC VALVE (N/A) TRANSESOPHAGEAL ECHOCARDIOGRAM (TEE) (N/A) Plan for transfer to step-down: see transfer orders POD # 2 doing well NEURO- intact CV- in SR.   Baby aspirin +  coumadin, restart Coreg  INR 1.8, 2 mg warfarin again today RESP- Continue IS RENAL- creatinine normal  Continue diuresis, supplement K ENDO- CBG well controlled  Change SSi to [facility] and HS Anemia secondary to ABL- stable, monitor SCD for DVT prophylaxis until coumadin therapeutic Gi- tolerating PO Continue cardiac rehab   LOS: 2 days    [name] 04/29/2021

## 2021-04-30 ENCOUNTER — Inpatient Hospital Stay (HOSPITAL_COMMUNITY): Payer: 59

## 2021-04-30 LAB — BASIC METABOLIC PANEL
Anion gap: 8 (ref 5–15)
BUN: 12 mg/dL (ref 6–20)
CO2: 28 mmol/L (ref 22–32)
Calcium: 8.5 mg/dL — ABNORMAL LOW (ref 8.9–10.3)
Chloride: 99 mmol/L (ref 98–111)
Creatinine, Ser: 1.13 mg/dL (ref 0.61–1.24)
GFR, Estimated: >60 mL/min (ref >60)
Glucose, Bld: 100 mg/dL — ABNORMAL HIGH (ref 70–99)
Potassium: 4.2 mmol/L (ref 3.5–5.1)
Sodium: 135 mmol/L (ref 135–145)

## 2021-04-30 LAB — CBC
HCT: 22 % — ABNORMAL LOW (ref 39.0–52.0)
Hemoglobin: 7.1 g/dL — ABNORMAL LOW (ref 13.0–17.0)
MCH: 29.5 pg (ref 26.0–34.0)
MCHC: 32.3 g/dL (ref 30.0–36.0)
MCV: 91.3 fL (ref 80.0–100.0)
Platelets: 100 10*3/uL — ABNORMAL LOW (ref 150–400)
RBC: 2.41 MIL/uL — ABNORMAL LOW (ref 4.22–5.81)
RDW: 11.7 % (ref 11.5–15.5)
WBC: 11.7 10*3/uL — ABNORMAL HIGH (ref 4.0–10.5)
nRBC: 0.2 % (ref 0.0–0.2)

## 2021-04-30 LAB — PROTIME-INR
INR: 1.5 — ABNORMAL HIGH (ref 0.8–1.2)
Prothrombin Time: 17.6 seconds — ABNORMAL HIGH (ref 11.4–15.2)

## 2021-04-30 LAB — GLUCOSE, CAPILLARY
Glucose-Capillary: 81 mg/dL (ref 70–99)
Glucose-Capillary: 79 mg/dL (ref 70–99)
Glucose-Capillary: 76 mg/dL (ref 70–99)

## 2021-04-30 LAB — HEPARIN LEVEL (UNFRACTIONATED): Heparin Unfractionated: <0.1 IU/mL — ABNORMAL LOW (ref 0.30–0.70)

## 2021-04-30 MED ORDER — POTASSIUM CHLORIDE CRYS ER 20 MEQ PO TBCR
20.0000 meq | EXTENDED_RELEASE_TABLET | Freq: Every day | ORAL | Status: DC
  Administered 2021-04-30: 20 meq via ORAL
  Filled 2021-04-30: qty 1

## 2021-04-30 MED ORDER — WARFARIN SODIUM 4 MG PO TABS
4.0000 mg | ORAL_TABLET | Freq: Once | ORAL | Status: AC
  Administered 2021-04-30: 4 mg via ORAL
  Filled 2021-04-30: qty 1

## 2021-04-30 MED ORDER — TORSEMIDE 20 MG PO TABS: 20.0000 mg | ORAL_TABLET | Freq: Every day | ORAL | Status: DC

## 2021-04-30 MED ORDER — LOPERAMIDE HCL 2 MG PO CAPS
4.0000 mg | ORAL_CAPSULE | ORAL | Status: DC | PRN
  Administered 2021-05-01: 4 mg via ORAL
  Filled 2021-04-30: qty 2

## 2021-04-30 MED ORDER — TORSEMIDE 20 MG PO TABS
20.0000 mg | ORAL_TABLET | Freq: Every day | ORAL | Status: DC
  Administered 2021-04-30 – 2021-05-03 (×4): 20 mg via ORAL
  Filled 2021-04-30 (×4): qty 1

## 2021-04-30 NOTE — Progress Notes (Signed)
Pacing Wires removed at 12:45  Vitals taken every 15 minutes and Validated  Pt tolerated well  Will continue to monitor

## 2021-04-30 NOTE — Progress Notes (Addendum)
[address]       [place] [address]             [phone]      3 Days Post-Op Procedure(s) (LRB): REDO AORTIC VALVE REPLACEMENT (AVR) USING ON-X 23MM AORTIC VALVE (N/A) TRANSESOPHAGEAL ECHOCARDIOGRAM (TEE) (N/A) Subjective: Up in bedside chair.  Reports she has had multiple scans.  No abdominal pain.  Objective: Vital signs in last 24 hours: Temp:  [98.4 F (36.9 C)-98.9 F (37.2 C)] 98.6 F (37 C) (12/11 1002) Pulse Rate:  [89-116] 104 (12/11 1002) Cardiac Rhythm: Sinus tachycardia;Bundle branch block (12/11 0700) Resp:  [19-31] 20 (12/11 1002) BP: (117-156)/(71-87) 124/87 (12/11 1002) SpO2:  [80 %-100 %] 100 % (12/11 1002) Weight:  [93.8 kg] 93.8 kg (12/11 0500)      Intake/Output from previous day: 12/10 0701 - 12/11 0700 In: 380 [P.O.:380] Out: 900 [Urine:900] Intake/Output this shift: No intake/output data recorded.  General appearance: alert, cooperative, and mild distress Neurologic: intact Heart: Sinus rhythm/sinus tach, 110 Lungs: Breath sounds clear but shallow. Abdomen: Soft and nontender active bowel sounds. Extremities: No peripheral edema Wound: Sternotomy incision is well approximated and dry.  Lab Results: Recent Labs    04/29/21 0314 04/30/21 0207  WBC 13.3* 11.7*  HGB 7.9* 7.1*  HCT 24.1* 22.0*  PLT 89* 100*   BMET:  Recent Labs    04/29/21 0314 04/30/21 0207  NA 136 135  K 3.8 4.2  CL 100 99  CO2 29 28  GLUCOSE 118* 100*  BUN 10 12  CREATININE 1.14 1.13  CALCIUM 8.2* 8.5*    PT/INR:  Recent Labs    04/30/21 0207  LABPROT 17.6*  INR 1.5*   ABG    Component Value Date/Time   PHART 7.334 (L) 04/28/2021 0500   HCO3 25.9 04/28/2021 0500   TCO2 27 04/28/2021 0500   ACIDBASEDEF 1.0 04/28/2021 0143   O2SAT 98.0 04/28/2021 0500   CBG (last 3)  Recent Labs    04/29/21 1706 04/29/21 2110 04/30/21 0605  GLUCAP 79 108* 81    CLINICAL DATA:  Recent aortic valve replacement   EXAM: CHEST -  2 VIEW   COMPARISON:  Prior chest x-ray 04/29/2021   FINDINGS: Stable cardiomegaly. Left subclavian approach cardiac rhythm maintenance device with the lead projecting over the right ventricle. Patient is status post median sternotomy with evidence of aortic valve replacement. Persistent decreased volume at the left lung base likely reflecting atelectasis. No pneumothorax. No pulmonary edema. No acute osseous abnormality.   IMPRESSION: Persistent low lung volumes with persistent left basilar atelectasis. Slightly improved right basilar atelectasis.   No acute abnormality.     Electronically Signed   By: Jacqulynn Cadet M.D.   On: 04/30/2021 07:53    Assessment/Plan: S/P Procedure(s) (LRB): REDO AORTIC VALVE REPLACEMENT (AVR) USING ON-X 23MM AORTIC VALVE (N/A) TRANSESOPHAGEAL ECHOCARDIOGRAM (TEE) (N/A)  -Postop day 3 redo sternotomy with aortic valve replacement using Onyx 23 mm mechanical valve.  On Coumadin, low-dose aspirin, carvedilol.  INR 1.5.  Increase the Coumadin to 4 mg tonight.  Progressing with mobility. Mild sinus tachycardia, likely related to anemia.   -Heme-expected acute blood loss anemia.  Hematocrit is drifted down to 22%. On Trinsicon 3 times daily.  Will hold off on transfusion and re-check in AM.  -GI- tolerating PO's but having frequent stools. No abd pain.  Stopping stool softeners and Reglan order is now complete. PRN Imodium.   -PULM- Expected post-op changes on  CXR, On 2L/[place].  Continue working on pulmonary hygiene.  -Endo-no history of diabetes mellitus.  Glucose well controlled.  Will DC CBGs and SSI.  -Renal-normal function at baseline.  Weight is about 10 pounds above preop.  Minimal response to Lasix yesterday.  We will switch to torsemide today.    LOS: 3 days    Malon Kindle [phone] 04/30/2021  Patient seen and examined, agree with findings, assessment and plan as outlined above  Remo Lipps C. Roxan Hockey, MD [facility] [phone]

## 2021-05-01 ENCOUNTER — Other Ambulatory Visit: Payer: Self-pay | Admitting: Cardiology

## 2021-05-01 DIAGNOSIS — I359 Nonrheumatic aortic valve disorder, unspecified: Secondary | ICD-10-CM

## 2021-05-01 LAB — TYPE AND SCREEN
ABO/RH(D): O POS
Antibody Screen: NEGATIVE
Unit division: 0
Unit division: 0
Unit division: 0
Unit division: 0
Unit division: 0
Unit division: 0

## 2021-05-01 LAB — BPAM RBC
Blood Product Expiration Date: 202212282359
Blood Product Expiration Date: 202301022359
Blood Product Expiration Date: 202301022359
Blood Product Expiration Date: 202301022359
Blood Product Expiration Date: 202301022359
Blood Product Expiration Date: 202301022359
ISSUE DATE / TIME: 202212080813
ISSUE DATE / TIME: 202212080813
ISSUE DATE / TIME: 202212080813
ISSUE DATE / TIME: 202212080813
Unit Type and Rh: 5100
Unit Type and Rh: 5100
Unit Type and Rh: 5100
Unit Type and Rh: 5100
Unit Type and Rh: 5100
Unit Type and Rh: 5100

## 2021-05-01 LAB — BASIC METABOLIC PANEL WITH GFR
Anion gap: 9 (ref 5–15)
BUN: 16 mg/dL (ref 6–20)
CO2: 29 mmol/L (ref 22–32)
Calcium: 8.7 mg/dL — ABNORMAL LOW (ref 8.9–10.3)
Chloride: 100 mmol/L (ref 98–111)
Creatinine, Ser: 1.28 mg/dL — ABNORMAL HIGH (ref 0.61–1.24)
GFR, Estimated: >60 mL/min
Glucose, Bld: 86 mg/dL (ref 70–99)
Potassium: 3.5 mmol/L (ref 3.5–5.1)
Sodium: 138 mmol/L (ref 135–145)

## 2021-05-01 LAB — CBC
HCT: 22.8 % — ABNORMAL LOW (ref 39.0–52.0)
Hemoglobin: 7.5 g/dL — ABNORMAL LOW (ref 13.0–17.0)
MCH: 29.5 pg (ref 26.0–34.0)
MCHC: 32.9 g/dL (ref 30.0–36.0)
MCV: 89.8 fL (ref 80.0–100.0)
Platelets: 158 K/uL (ref 150–400)
RBC: 2.54 MIL/uL — ABNORMAL LOW (ref 4.22–5.81)
RDW: 11.7 % (ref 11.5–15.5)
WBC: 10 K/uL (ref 4.0–10.5)
nRBC: 0.5 % — ABNORMAL HIGH (ref 0.0–0.2)

## 2021-05-01 LAB — PROTIME-INR
INR: 1.3 — ABNORMAL HIGH (ref 0.8–1.2)
Prothrombin Time: 16.4 seconds — ABNORMAL HIGH (ref 11.4–15.2)

## 2021-05-01 LAB — GLUCOSE, CAPILLARY: Glucose-Capillary: 94 mg/dL (ref 70–99)

## 2021-05-01 MED ORDER — POTASSIUM CHLORIDE CRYS ER 20 MEQ PO TBCR
20.0000 meq | EXTENDED_RELEASE_TABLET | Freq: Every day | ORAL | Status: DC
  Administered 2021-05-02 – 2021-05-03 (×2): 20 meq via ORAL
  Filled 2021-05-01 (×2): qty 1

## 2021-05-01 MED ORDER — COUMADIN BOOK
Freq: Once | Status: AC
  Filled 2021-05-01: qty 1

## 2021-05-01 MED ORDER — POTASSIUM CHLORIDE CRYS ER 20 MEQ PO TBCR
30.0000 meq | EXTENDED_RELEASE_TABLET | Freq: Two times a day (BID) | ORAL | Status: AC
  Administered 2021-05-01 (×2): 30 meq via ORAL
  Filled 2021-05-01 (×2): qty 1

## 2021-05-01 MED ORDER — WARFARIN SODIUM 5 MG PO TABS
5.0000 mg | ORAL_TABLET | Freq: Every day | ORAL | Status: DC
  Administered 2021-05-01: 5 mg via ORAL
  Filled 2021-05-01: qty 1

## 2021-05-01 NOTE — Progress Notes (Signed)
CARDIAC REHAB PHASE I   PRE:  Rate/Rhythm: 100 ST    BP: sitting 129/73    SaO2: 96 RA  MODE:  Ambulation: 270 ft   POST:  Rate/Rhythm: 119 ST    BP: sitting 134/80     SaO2: 89-92 RA  Pt stood independently and ambulated hall with standby assist. C/o SOB and fatigue and rested x2. HR up and SaO2 might have been low walking, 89-92 RA once sitting. Encouraged walking x2-3 more, can ambulate with wife. IS 600 ml, encouraged use. He and his wife would benefit from coumadin counseling with pharmacy. [place], ACSM 05/01/2021 8:50 AM

## 2021-05-01 NOTE — Progress Notes (Signed)
  Transition of Care [facility]) Screening Note   Patient Details  Name: [name] Date of Birth: 1976/02/25   Transition of Care [facility]) CM/SW Contact:    Dawayne Patricia, RN Phone Number: 05/01/2021, 12:25 PM    Transition of Care Department [facility]) has reviewed patient and no TOC needs have been identified at this time. We will continue to monitor patient advancement through interdisciplinary progression rounds. If new patient transition needs arise, please place a TOC consult.

## 2021-05-01 NOTE — Progress Notes (Signed)
IVT consulted for difficult PIV placement.  Pt is in chair and plans to be there for awhile.  RN to place new consult once pt is back in bed.

## 2021-05-01 NOTE — Progress Notes (Addendum)
      [address]       [place] [address]             [phone]        4 Days Post-Op Procedure(s) (LRB): REDO AORTIC VALVE REPLACEMENT (AVR) USING ON-X 23MM AORTIC VALVE (N/A) TRANSESOPHAGEAL ECHOCARDIOGRAM (TEE) (N/A)  Subjective: Patient had loose stools again this am  Objective: Vital signs in last 24 hours: Temp:  [98.5 F (36.9 C)-100.3 F (37.9 C)] 98.9 F (37.2 C) (12/12 0416) Pulse Rate:  [87-106] 87 (12/11 2306) Cardiac Rhythm: Normal sinus rhythm (12/12 0300) Resp:  [18-33] 19 (12/12 0416) BP: (110-129)/(61-87) 111/72 (12/11 2306) SpO2:  [96 %-100 %] 100 % (12/11 2306) Weight:  [92 kg] 92 kg (12/12 0421)  Pre op weight 88 kg Current Weight  05/01/21 92 kg       Intake/Output from previous day: 12/11 0701 - 12/12 0700 In: 300 [P.O.:300] Out: 2250 [Urine:2250]   Physical Exam:  Cardiovascular: RRR, sharp valve click Pulmonary: Clear to auscultation bilaterally Abdomen: Soft, non tender, bowel sounds present. Extremities: Mild bilateral lower extremity edema. Wound: Clean and dry.  No erythema or signs of infection.  Lab Results: CBC: Recent Labs    04/30/21 0207 05/01/21 0227  WBC 11.7* 10.0  HGB 7.1* 7.5*  HCT 22.0* 22.8*  PLT 100* 158   BMET:  Recent Labs    04/30/21 0207 05/01/21 0227  NA 135 138  K 4.2 3.5  CL 99 100  CO2 28 29  GLUCOSE 100* 86  BUN 12 16  CREATININE 1.13 1.28*  CALCIUM 8.5* 8.7*    PT/INR:  Lab Results  Component Value Date   INR 1.3 (H) 05/01/2021   INR 1.5 (H) 04/30/2021   INR 1.8 (H) 04/29/2021   ABG:  INR: Will add last result for INR, ABG once components are confirmed Will add last 4 CBG results once components are confirmed  Assessment/Plan:  1. CV - SR with HR in the 90's this am. On Coreg 6.25 mg bid and Coumadin. INR this am slightly decreased from 1.5 to 1.3. Will increase Coumadin to 5 mg. 2.  Pulmonary - On room air. Encourage incentive spirometer. 3. Volume  Overload - On Torsemide 20 mg daily 4.  Expected post op acute blood loss anemia - H and H this am stable at 7.5 and 22.8. Continue Trinsicon 5. Thrombocytopenia resolved-platelets this am up to 158,000 6. Supplement potassium 7. Creatinine slightly increased from 1.13 to 1.28 8. GI-stool softeners stopped yesterday as well as Reglan. Could consider Imodium if continues through today 9. Once determine Coumadin dose and diarrhea stopped, will discharge  Donielle M ZimmermanPA-C 05/01/2021,7:16 AM    Chart reviewed, patient examined, agree with above. He is doing well overall for POD 4 after 3rd time redo for AVR. INR not therapeutic yet and we are adjusting Coumadin. May take a few more days to determine dose for discharge. Continue diuresis, IS, ambulation.

## 2021-05-01 NOTE — Progress Notes (Signed)
Mobility Specialist: Progress Note   05/01/21 1713  Mobility  Activity Ambulated in hall  Level of Assistance Independent  Assistive Device None  Distance Ambulated (ft) 370 ft  Mobility Ambulated independently in hallway  Mobility Response Tolerated well  Mobility performed by Family member  Bed Position Chair   Pt seen ambulating in hallway with family member. Pt c/o feeling a little out of breath after returning to the room, otherwise no c/o. Pt is sitting in the chair. Will f/u tomorrow.   [facility] Lanny Donoso Mobility Specialist Mobility Specialist Phone #1: [phone] Mobility Specialist Phone #2: [phone]

## 2021-05-02 LAB — BASIC METABOLIC PANEL
Anion gap: 7 (ref 5–15)
BUN: 16 mg/dL (ref 6–20)
CO2: 29 mmol/L (ref 22–32)
Calcium: 8.7 mg/dL — ABNORMAL LOW (ref 8.9–10.3)
Chloride: 102 mmol/L (ref 98–111)
Creatinine, Ser: 1.28 mg/dL — ABNORMAL HIGH (ref 0.61–1.24)
GFR, Estimated: >60 mL/min (ref >60)
Glucose, Bld: 99 mg/dL (ref 70–99)
Potassium: 3.6 mmol/L (ref 3.5–5.1)
Sodium: 138 mmol/L (ref 135–145)

## 2021-05-02 LAB — PROTIME-INR
INR: 1.3 — ABNORMAL HIGH (ref 0.8–1.2)
Prothrombin Time: 16.2 seconds — ABNORMAL HIGH (ref 11.4–15.2)

## 2021-05-02 MED ORDER — FLUTICASONE PROPIONATE 50 MCG/ACT NA SUSP: 2.0000 | Freq: Every day | NASAL | Status: DC | PRN

## 2021-05-02 MED ORDER — ASPIRIN 81 MG PO TBEC: 81.0000 mg | DELAYED_RELEASE_TABLET | Freq: Every day | ORAL | 11 refills | Status: AC

## 2021-05-02 MED ORDER — WARFARIN SODIUM 7.5 MG PO TABS
7.5000 mg | ORAL_TABLET | Freq: Every day | ORAL | Status: DC
  Administered 2021-05-02 – 2021-05-03 (×2): 7.5 mg via ORAL
  Filled 2021-05-02 (×2): qty 1

## 2021-05-02 NOTE — Progress Notes (Signed)
Mobility Specialist: Progress Note   05/02/21 1715  Mobility  Activity Ambulated in hall  Level of Assistance Independent  Assistive Device None  Distance Ambulated (ft) 900 ft  Mobility Ambulated independently in hallway  Mobility Response Tolerated well  Mobility performed by Mobility specialist  Bed Position Chair  $Mobility charge 1 Mobility   During Mobility: 114 HR Post-Mobility: 102 HR  Pt independent with ambulation. Pt stopped x1 for one short standing break d/t fatigue. Pt to chair after walk.   [facility] Jesse Hirst Mobility Specialist Mobility Specialist 4 [place]: [phone] Mobility Specialist 2 [place]: [phone]

## 2021-05-02 NOTE — Progress Notes (Signed)
Pt has been ambulating hall with his wife. No c/o. Discussed IS, sternal precautions, diet, exercise, and CRPII. Pt voiced understanding, will refer to [facility]. Gave ed materials, including videos to view. [place], ACSM 1:57 PM 05/02/2021

## 2021-05-02 NOTE — Progress Notes (Addendum)
      [address]       [place] [address]             [phone]        5 Days Post-Op Procedure(s) (LRB): REDO AORTIC VALVE REPLACEMENT (AVR) USING ON-X 23MM AORTIC VALVE (N/A) TRANSESOPHAGEAL ECHOCARDIOGRAM (TEE) (N/A)  Subjective: Patient having less stools  Objective: Vital signs in last 24 hours: Temp:  [99.7 F (37.6 C)-100 F (37.8 C)] 99.7 F (37.6 C) (12/13 0429) Pulse Rate:  [91-105] 91 (12/13 0429) Cardiac Rhythm: Sinus tachycardia (12/12 2002) Resp:  [20-21] 21 (12/13 0429) BP: (112-147)/(73-87) 112/73 (12/13 0429) SpO2:  [97 %-100 %] 100 % (12/13 0429) Weight:  [92.1 kg] 92.1 kg (12/13 0627)  Pre op weight 88 kg Current Weight  05/02/21 92.1 kg       Intake/Output from previous day: 12/12 0701 - 12/13 0700 In: 250 [P.O.:250] Out: 950 [Urine:950]   Physical Exam:  Cardiovascular: RRR, sharp valve click Pulmonary: Clear to auscultation bilaterally Abdomen: Soft, non tender, bowel sounds present. Extremities: Mild bilateral lower extremity edema. Wound: Clean and dry.  No erythema or signs of infection.  Lab Results: CBC: Recent Labs    04/30/21 0207 05/01/21 0227  WBC 11.7* 10.0  HGB 7.1* 7.5*  HCT 22.0* 22.8*  PLT 100* 158    BMET:  Recent Labs    04/30/21 0207 05/01/21 0227  NA 135 138  K 4.2 3.5  CL 99 100  CO2 28 29  GLUCOSE 100* 86  BUN 12 16  CREATININE 1.13 1.28*  CALCIUM 8.5* 8.7*     PT/INR:  Lab Results  Component Value Date   INR 1.3 (H) 05/01/2021   INR 1.5 (H) 04/30/2021   INR 1.8 (H) 04/29/2021   ABG:  INR: Will add last result for INR, ABG once components are confirmed Will add last 4 CBG results once components are confirmed  Assessment/Plan:  1. CV - SR with HR in the 90's this am. On Coreg 6.25 mg bid and Coumadin. Await this am's INR. 2.  Pulmonary - On room air. Encourage incentive spirometer. 3. Volume Overload - On Torsemide 20 mg daily 4.  Expected post op acute blood  loss anemia - Last H and H stable at 7.5 and 22.8. Continue Trinsicon 5. Thrombocytopenia resolved-Last platelets  up to 158,000 6. Creatinine yesterday 1.28;await this am's result 8. Once determine Coumadin dose, will be able to discharge  Ivor 05/02/2021,7:00 AM    Chart reviewed, patient examined, agree with above. He looks good. INR has not bumped yet so will give 7.5 mg Coumadin tonight.

## 2021-05-03 LAB — PROTIME-INR
INR: 1.8 — ABNORMAL HIGH (ref 0.8–1.2)
Prothrombin Time: 21.2 seconds — ABNORMAL HIGH (ref 11.4–15.2)

## 2021-05-03 MED ORDER — WARFARIN SODIUM 5 MG PO TABS: 5.0000 mg | ORAL_TABLET | Freq: Every day | ORAL | 1 refills | Status: DC

## 2021-05-03 MED ORDER — OXYCODONE HCL 5 MG PO TABS: 5.0000 mg | ORAL_TABLET | ORAL | 0 refills | Status: DC | PRN

## 2021-05-03 MED ORDER — CARVEDILOL 6.25 MG PO TABS: 6.2500 mg | ORAL_TABLET | Freq: Two times a day (BID) | ORAL | 1 refills | Status: DC

## 2021-05-03 MED ORDER — POTASSIUM CHLORIDE CRYS ER 20 MEQ PO TBCR
20.0000 meq | EXTENDED_RELEASE_TABLET | Freq: Every day | ORAL | 0 refills | Status: DC
Start: 2021-05-03 — End: 2022-05-23

## 2021-05-03 MED ORDER — TORSEMIDE 20 MG PO TABS
20.0000 mg | ORAL_TABLET | Freq: Every day | ORAL | 0 refills | Status: DC
Start: 2021-05-03 — End: 2021-09-06

## 2021-05-03 MED FILL — Heparin Sodium (Porcine) Inj 1000 Unit/ML: Qty: 1000 | Status: AC

## 2021-05-03 MED FILL — Mannitol IV Soln 20%: INTRAVENOUS | Qty: 500 | Status: AC

## 2021-05-03 MED FILL — Albumin, Human Inj 5%: INTRAVENOUS | Qty: 500 | Status: AC

## 2021-05-03 MED FILL — Heparin Sodium (Porcine) Inj 1000 Unit/ML: INTRAMUSCULAR | Qty: 10 | Status: AC

## 2021-05-03 MED FILL — Potassium Chloride Inj 2 mEq/ML: INTRAVENOUS | Qty: 40 | Status: AC

## 2021-05-03 MED FILL — Calcium Chloride Inj 10%: INTRAVENOUS | Qty: 10 | Status: AC

## 2021-05-03 MED FILL — Electrolyte-R (PH 7.4) Solution: INTRAVENOUS | Qty: 9000 | Status: AC

## 2021-05-03 MED FILL — Lidocaine HCl Local Preservative Free (PF) Inj 2%: INTRAMUSCULAR | Qty: 15 | Status: AC

## 2021-05-03 MED FILL — Sodium Bicarbonate IV Soln 8.4%: INTRAVENOUS | Qty: 100 | Status: AC

## 2021-05-03 NOTE — Progress Notes (Signed)
Patient given discharge instructions medication list and follow up appointments. Patient verbalized understanding. Patient prescriptions sent to personal pharmacy. IV and tele were dcd. Will discharge home as ordered. Transported to exit via wheel chair an Physicist, medical. Ardit Danh, Bettina Gavia RN

## 2021-05-03 NOTE — Progress Notes (Addendum)
° °   °  [address]       [place] [address]             [phone]        6 Days Post-Op Procedure(s) (LRB): REDO AORTIC VALVE REPLACEMENT (AVR) USING ON-X 23MM AORTIC VALVE (N/A) TRANSESOPHAGEAL ECHOCARDIOGRAM (TEE) (N/A)  Subjective: Patient states loose stools have stopped  Objective: Vital signs in last 24 hours: Temp:  [98.7 F (37.1 C)-99.6 F (37.6 C)] 99.4 F (37.4 C) (12/14 0406) Pulse Rate:  [81-99] 94 (12/14 0406) Cardiac Rhythm: Normal sinus rhythm (12/14 0230) Resp:  [18-20] 18 (12/14 0406) BP: (123-140)/(76-88) 123/79 (12/14 0406) SpO2:  [96 %-100 %] 98 % (12/14 0406) Weight:  [90.6 kg] 90.6 kg (12/14 0407)  Pre op weight 88 kg Current Weight  05/03/21 90.6 kg       Intake/Output from previous day: 12/13 0701 - 12/14 0700 In: 920 [P.O.:920] Out: 1600 [Urine:1600]   Physical Exam:  Cardiovascular: RRR, sharp valve click Pulmonary: Clear to auscultation bilaterally Abdomen: Soft, non tender, bowel sounds present. Extremities: Trace bilateral lower extremity edema. Wound: Clean and dry.  No erythema or signs of infection.  Lab Results: CBC: Recent Labs    05/01/21 0227  WBC 10.0  HGB 7.5*  HCT 22.8*  PLT 158    BMET:  Recent Labs    05/01/21 0227 05/02/21 0632  NA 138 138  K 3.5 3.6  CL 100 102  CO2 29 29  GLUCOSE 86 99  BUN 16 16  CREATININE 1.28* 1.28*  CALCIUM 8.7* 8.7*     PT/INR:  Lab Results  Component Value Date   INR 1.8 (H) 05/03/2021   INR 1.3 (H) 05/02/2021   INR 1.3 (H) 05/01/2021   ABG:  INR: Will add last result for INR, ABG once components are confirmed Will add last 4 CBG results once components are confirmed  Assessment/Plan:  1. CV - SR with HR in the 90's this am. On Coreg 6.25 mg bid and Coumadin. INR this am increased from 1.3 to 1.8 and that was from Coumadin 5 mg. Will likely need 5 mg at discharge. 2.  Pulmonary - On room air. Encourage incentive spirometer. 3. Volume  Overload - On Torsemide 20 mg daily 4.  Expected post op acute blood loss anemia - Last H and H stable at 7.5 and 22.8. Continue Trinsicon 6. Last creatinine stable at 1.28. 8. As discussed Dr. Cyndia Bent this am, will give Coumadin 7.5 mg prior to discharge and discharge him on 5 mg Coumadin. Chest tube sutures will be removed in the office after discharge  Donielle M ZimmermanPA-C 05/03/2021,7:12 AM     Chart reviewed, patient examined, agree with above. INR bumped to 1.8. Will give 7.5 mg Coumadin today and 5 mg daily after that with followup INR Friday. Goal INR 2-3 for the first 3 months.

## 2021-05-05 ENCOUNTER — Ambulatory Visit (INDEPENDENT_AMBULATORY_CARE_PROVIDER_SITE_OTHER): Payer: 59

## 2021-05-05 ENCOUNTER — Other Ambulatory Visit: Payer: Self-pay

## 2021-05-05 DIAGNOSIS — Z7901 Long term (current) use of anticoagulants: Secondary | ICD-10-CM | POA: Diagnosis not present

## 2021-05-05 DIAGNOSIS — Z952 Presence of prosthetic heart valve: Secondary | ICD-10-CM | POA: Diagnosis not present

## 2021-05-05 DIAGNOSIS — Z5181 Encounter for therapeutic drug level monitoring: Secondary | ICD-10-CM | POA: Diagnosis not present

## 2021-05-05 DIAGNOSIS — I350 Nonrheumatic aortic (valve) stenosis: Secondary | ICD-10-CM

## 2021-05-05 LAB — POCT INR: INR: 1.9 — AB (ref 2.0–3.0)

## 2021-05-05 NOTE — Patient Instructions (Addendum)
Description   Start taking 1 tablet (5mg ) daily EXCEPT 1.5 tablets (7.5mg ) on Mondays and Fridays. Stay consistent with greens weekly.  Recheck INR in 1 week. Coumadin Clinic [phone]     A full discussion of the nature of anticoagulants has been carried out.  A benefit risk analysis has been presented to the patient, so that they understand the justification for choosing anticoagulation at this time. The need for frequent and regular monitoring, precise dosage adjustment and compliance is stressed.  Side effects of potential bleeding are discussed.  The patient should avoid any OTC items containing aspirin or ibuprofen, and should avoid great swings in general diet.  Avoid alcohol consumption.  Call if any signs of abnormal bleeding.

## 2021-05-11 ENCOUNTER — Ambulatory Visit (INDEPENDENT_AMBULATORY_CARE_PROVIDER_SITE_OTHER): Payer: 59

## 2021-05-11 DIAGNOSIS — I429 Cardiomyopathy, unspecified: Secondary | ICD-10-CM | POA: Diagnosis not present

## 2021-05-11 LAB — CUP PACEART REMOTE DEVICE CHECK
Battery Remaining Longevity: 66 mo
Battery Remaining Percentage: 66 %
Brady Statistic RV Percent Paced: 0 %
Date Time Interrogation Session: 20221222045800
HighPow Impedance: 34 Ohm
Implantable Lead Implant Date: 20080214
Implantable Lead Location: 753860
Implantable Lead Model: 185
Implantable Lead Serial Number: 178017
Implantable Pulse Generator Implant Date: 20141216
Lead Channel Impedance Value: 534 Ohm
Lead Channel Pacing Threshold Amplitude: 1 V
Lead Channel Pacing Threshold Pulse Width: 0.4 ms
Lead Channel Setting Pacing Amplitude: 2 V
Lead Channel Setting Pacing Pulse Width: 0.4 ms
Lead Channel Setting Sensing Sensitivity: 0.6 mV
Pulse Gen Serial Number: 126781

## 2021-05-12 ENCOUNTER — Ambulatory Visit (INDEPENDENT_AMBULATORY_CARE_PROVIDER_SITE_OTHER): Payer: 59

## 2021-05-12 ENCOUNTER — Other Ambulatory Visit: Payer: Self-pay

## 2021-05-12 DIAGNOSIS — I350 Nonrheumatic aortic (valve) stenosis: Secondary | ICD-10-CM

## 2021-05-12 DIAGNOSIS — Z7901 Long term (current) use of anticoagulants: Secondary | ICD-10-CM | POA: Diagnosis not present

## 2021-05-12 DIAGNOSIS — Z952 Presence of prosthetic heart valve: Secondary | ICD-10-CM

## 2021-05-12 LAB — POCT INR: INR: 1.8 — AB (ref 2.0–3.0)

## 2021-05-12 NOTE — Patient Instructions (Signed)
Description   Take 2 tablets today and then start taking 1 tablet (5mg ) daily EXCEPT 1.5 tablets (7.5mg ) on Mondays, Wednesdays and Fridays. Stay consistent with greens weekly (2 times per week).  Recheck INR in 1 week. Coumadin Clinic [phone]

## 2021-05-16 ENCOUNTER — Encounter (INDEPENDENT_AMBULATORY_CARE_PROVIDER_SITE_OTHER): Payer: Self-pay

## 2021-05-16 ENCOUNTER — Other Ambulatory Visit: Payer: Self-pay

## 2021-05-16 DIAGNOSIS — Z4802 Encounter for removal of sutures: Secondary | ICD-10-CM

## 2021-05-19 ENCOUNTER — Other Ambulatory Visit: Payer: Self-pay

## 2021-05-19 ENCOUNTER — Ambulatory Visit (INDEPENDENT_AMBULATORY_CARE_PROVIDER_SITE_OTHER): Payer: 59 | Admitting: *Deleted

## 2021-05-19 DIAGNOSIS — Z952 Presence of prosthetic heart valve: Secondary | ICD-10-CM | POA: Diagnosis not present

## 2021-05-19 DIAGNOSIS — I350 Nonrheumatic aortic (valve) stenosis: Secondary | ICD-10-CM

## 2021-05-19 DIAGNOSIS — Z5181 Encounter for therapeutic drug level monitoring: Secondary | ICD-10-CM

## 2021-05-19 DIAGNOSIS — Z7901 Long term (current) use of anticoagulants: Secondary | ICD-10-CM | POA: Diagnosis not present

## 2021-05-19 LAB — POCT INR: INR: 1.7 — AB (ref 2.0–3.0)

## 2021-05-19 NOTE — Patient Instructions (Signed)
Description   START taking warfarin 1.5 tablets daily except for 1 tablet on Sundays and Thursdays. Recheck INR in 1 week. Coumadin Clinic [phone].

## 2021-05-23 NOTE — Progress Notes (Signed)
Remote ICD transmission.   

## 2021-05-24 ENCOUNTER — Other Ambulatory Visit: Payer: Self-pay | Admitting: Surgery

## 2021-05-24 DIAGNOSIS — Z952 Presence of prosthetic heart valve: Secondary | ICD-10-CM

## 2021-05-25 ENCOUNTER — Other Ambulatory Visit: Payer: Self-pay

## 2021-05-25 ENCOUNTER — Other Ambulatory Visit: Payer: Self-pay | Admitting: Physician Assistant

## 2021-05-25 ENCOUNTER — Encounter: Payer: 59 | Attending: Internal Medicine

## 2021-05-25 DIAGNOSIS — Z952 Presence of prosthetic heart valve: Secondary | ICD-10-CM | POA: Insufficient documentation

## 2021-05-25 NOTE — Progress Notes (Signed)
Virtual Visit completed. Patient informed on EP and RD appointment and 6 Minute walk test. Patient also informed of patient health questionnaires on My Chart. Patient Verbalizes understanding. Visit diagnosis can be found in [facility] 04/27/2021.

## 2021-05-26 ENCOUNTER — Ambulatory Visit (INDEPENDENT_AMBULATORY_CARE_PROVIDER_SITE_OTHER): Payer: 59 | Admitting: *Deleted

## 2021-05-26 DIAGNOSIS — I350 Nonrheumatic aortic (valve) stenosis: Secondary | ICD-10-CM

## 2021-05-26 DIAGNOSIS — Z7901 Long term (current) use of anticoagulants: Secondary | ICD-10-CM

## 2021-05-26 DIAGNOSIS — Z5181 Encounter for therapeutic drug level monitoring: Secondary | ICD-10-CM

## 2021-05-26 DIAGNOSIS — Z952 Presence of prosthetic heart valve: Secondary | ICD-10-CM | POA: Diagnosis not present

## 2021-05-26 LAB — POCT INR: INR: 1.5 — AB (ref 2.0–3.0)

## 2021-05-26 MED ORDER — WARFARIN SODIUM 5 MG PO TABS: ORAL_TABLET | ORAL | 0 refills | Status: DC

## 2021-05-26 NOTE — Patient Instructions (Signed)
Description    Take 2 tablets of warfarin today. Then START taking warfarin 1.5 tablets daily.Recheck INR in 1 week. Coumadin Clinic [phone]

## 2021-05-30 ENCOUNTER — Telehealth: Payer: Self-pay | Admitting: *Deleted

## 2021-05-30 NOTE — Telephone Encounter (Signed)
°  Care Management   Follow Up Note Transition Care Management Unsuccessful Follow-up Telephone Call  Date of discharge and from where:  05/03/2021   [facility]  Attempts:  1st Attempt  Reason for unsuccessful TCM follow-up call:  No answer/busy  Jacqlyn Larsen [facility], BSN RN Case Manager [phone]

## 2021-05-31 ENCOUNTER — Ambulatory Visit (INDEPENDENT_AMBULATORY_CARE_PROVIDER_SITE_OTHER): Payer: Self-pay | Admitting: Surgery

## 2021-05-31 ENCOUNTER — Encounter: Payer: Self-pay | Admitting: Surgery

## 2021-05-31 ENCOUNTER — Ambulatory Visit
Admission: RE | Admit: 2021-05-31 | Discharge: 2021-05-31 | Disposition: A | Discharge status: Home / Self Care | Payer: 59 | Source: Ambulatory Visit | Attending: Surgery | Admitting: Surgery

## 2021-05-31 ENCOUNTER — Other Ambulatory Visit: Payer: Self-pay

## 2021-05-31 VITALS — BP 157/99 | HR 90 | Resp 20 | Ht 70.0 in | Wt 206.0 lb

## 2021-05-31 DIAGNOSIS — Z952 Presence of prosthetic heart valve: Secondary | ICD-10-CM

## 2021-05-31 MED ORDER — OXYCODONE HCL 5 MG PO TABS: 5.0000 mg | ORAL_TABLET | ORAL | 0 refills | Status: DC | PRN

## 2021-06-01 NOTE — Progress Notes (Signed)
HPI: Patient returns for routine postoperative follow-up having undergone third time redo median sternotomy for aortic valve replacement using a 23 mm On-X mechanical valve on 04/27/2021. The patient's early postoperative recovery while in the hospital was notable for an uncomplicated postoperative course. Since hospital discharge the patient reports that he has been feeling fairly well and gradually improving.  He is walking daily without chest pain or shortness of breath.  He has been checking his blood pressure at home and it has been running in the 140s to 150s. His last INR was checked on 05/26/2021 and was 1.5.  His Coumadin dose was increased to 7.5 mg/day.   Current Outpatient Medications  Medication Sig Dispense Refill   acetaminophen (TYLENOL) 500 MG tablet Take 500 mg by mouth every 6 (six) hours as needed for moderate pain.     aspirin EC 81 MG EC tablet Take 1 tablet (81 mg total) by mouth daily. Swallow whole. 30 tablet 11   carvedilol (COREG) 6.25 MG tablet Take 1 tablet (6.25 mg total) by mouth 2 (two) times daily. 60 tablet 1   fluticasone (FLONASE) 50 MCG/ACT nasal spray Place 2 sprays into both nostrils daily as needed for allergies.     magnesium oxide (MAG-OX) 400 (241.3 Mg) MG tablet Take 1 tablet (400 mg total) by mouth 2 (two) times daily. 60 tablet 0   meclizine (ANTIVERT) 25 MG tablet Take 0.5-1 tablets (12.5-25 mg total) by mouth 3 (three) times daily as needed for dizziness. 30 tablet 2   Multiple Vitamin (MULTIVITAMIN) tablet Take 2 tablets by mouth daily.     oxyCODONE (OXY IR/ROXICODONE) 5 MG immediate release tablet Take 1 tablet (5 mg total) by mouth every 4 (four) hours as needed for severe pain. 30 tablet 0   potassium chloride SA (KLOR-CON M) 20 MEQ tablet Take 1 tablet (20 mEq total) by mouth daily. For 4 days then stop. 4 tablet 0   torsemide (DEMADEX) 20 MG tablet Take 1 tablet (20 mg total) by mouth daily. For 4 days then stop. 4 tablet 0   triamcinolone  cream (KENALOG) 0.1 % Apply 1 application topically 2 (two) times daily as needed. 30 g 0   warfarin (COUMADIN) 5 MG tablet Take 1 1/2 tablets by mouth daily or as directed by the coumadin clinic 50 tablet 0   No current facility-administered medications for this visit.    Physical Exam: BP (!) 157/99 (BP Location: Right Arm, Patient Position: Sitting)    Pulse 90    Resp 20    Ht 5\' 10"  (1.778 m)    Wt 206 lb (93.4 kg)    SpO2 96% Comment: RA   BMI 29.56 kg/m  He looks well. Cardiac exam shows a regular rate and rhythm with normal mechanical valve sounds.  There is no murmur. Lungs are clear. The chest incision is healing well and sternum is stable. There is no peripheral edema.  Diagnostic Tests:  Narrative & Impression  CLINICAL DATA:  Aortic valve replacement 04/27/2021. Some shortness of breath and pain.   EXAM: CHEST - 2 VIEW   COMPARISON:  Most recent radiograph 04/30/2021   FINDINGS: Post median sternotomy. Prosthetic aortic valve. Single lead left-sided pacemaker with lead projecting over the right ventricle. Upper normal heart size with stable mediastinal contours. Volume loss at the left lung base with left basilar opacity, persistent but slightly improved from prior exam. Probable small right pleural effusion. No pulmonary edema. No pneumothorax. Stable osseous structures.   IMPRESSION:  1. Postsurgical change of the chest with improved but persistent left lung base opacity, likely combination of atelectasis and small pleural effusion. 2. Probable small right pleural effusion. 3. Upper normal heart size post aortic valve replacement.     Electronically Signed   By: Keith Rake M.D.   On: 05/31/2021 15:57      Impression:  He is doing well 1 month following his surgery.  I encouraged him to continue increasing the distance that he is walking.  I asked him not to lift anything heavier than 10 pounds for 3 months postoperatively.  His blood pressure is  rising since discharge and I asked him to increase his Coreg to 12.5 mg twice daily which was his prior dose before surgery.  If his pressure stays up after that I asked him to resume his Entresto at his previous dose.  He is still having some surgical pain and I renewed his oxycodone IR today.  He is scheduled for a follow-up visit with Dr. Burt Knack on 06/06/2021 and will have a follow-up echo on 06/07/2021.  His INR will continue to be monitored by the Coumadin clinic.  With his On-X mechanical valve the INR should be maintained in the normal therapeutic range of 2-3 for the first 3 months and then can be decreased to 1.5-2 with addition of aspirin 81 mg daily.  Plan: I will plan to see him back in 1 month for follow-up   Gaye Pollack, MD [facility] [phone]

## 2021-06-02 ENCOUNTER — Ambulatory Visit (INDEPENDENT_AMBULATORY_CARE_PROVIDER_SITE_OTHER): Payer: 59

## 2021-06-02 ENCOUNTER — Other Ambulatory Visit: Payer: Self-pay

## 2021-06-02 DIAGNOSIS — I350 Nonrheumatic aortic (valve) stenosis: Secondary | ICD-10-CM | POA: Diagnosis not present

## 2021-06-02 DIAGNOSIS — Z7901 Long term (current) use of anticoagulants: Secondary | ICD-10-CM

## 2021-06-02 DIAGNOSIS — Z952 Presence of prosthetic heart valve: Secondary | ICD-10-CM

## 2021-06-02 LAB — POCT INR: INR: 2.1 (ref 2.0–3.0)

## 2021-06-02 NOTE — Patient Instructions (Signed)
Description   START taking warfarin 1.5 tablets daily except 2 tablets every Wednesday. Recheck INR in 1 week. Be consistent with green (2 times per week)  Coumadin Clinic [phone]

## 2021-06-05 ENCOUNTER — Telehealth: Payer: Self-pay | Admitting: *Deleted

## 2021-06-05 NOTE — Telephone Encounter (Signed)
Transition Care Management Unsuccessful Follow-up Telephone Call  Date of discharge and from where: [facility]  05/03/2021  Attempts:  2nd Attempt  Reason for unsuccessful TCM follow-up call:  No answer/busy  Jacqlyn Larsen [facility], BSN RN Case Manager [phone]

## 2021-06-06 ENCOUNTER — Other Ambulatory Visit: Payer: Self-pay

## 2021-06-06 ENCOUNTER — Ambulatory Visit (INDEPENDENT_AMBULATORY_CARE_PROVIDER_SITE_OTHER): Payer: 59

## 2021-06-06 ENCOUNTER — Ambulatory Visit: Payer: 59 | Admitting: Cardiovascular Disease

## 2021-06-06 ENCOUNTER — Encounter: Payer: Self-pay | Admitting: Cardiovascular Disease

## 2021-06-06 VITALS — BP 140/100 | HR 88 | Ht 70.0 in | Wt 198.0 lb

## 2021-06-06 DIAGNOSIS — I10 Essential (primary) hypertension: Secondary | ICD-10-CM

## 2021-06-06 DIAGNOSIS — I472 Ventricular tachycardia, unspecified: Secondary | ICD-10-CM

## 2021-06-06 DIAGNOSIS — I5022 Chronic systolic (congestive) heart failure: Secondary | ICD-10-CM

## 2021-06-06 DIAGNOSIS — I429 Cardiomyopathy, unspecified: Secondary | ICD-10-CM | POA: Diagnosis not present

## 2021-06-06 DIAGNOSIS — I2510 Atherosclerotic heart disease of native coronary artery without angina pectoris: Secondary | ICD-10-CM

## 2021-06-06 DIAGNOSIS — I350 Nonrheumatic aortic (valve) stenosis: Secondary | ICD-10-CM

## 2021-06-06 MED ORDER — RANOLAZINE ER 1000 MG PO TB12: 1000.0000 mg | ORAL_TABLET | Freq: Two times a day (BID) | ORAL | 3 refills | Status: DC

## 2021-06-06 MED ORDER — SPIRONOLACTONE 25 MG PO TABS: 25.0000 mg | ORAL_TABLET | Freq: Every day | ORAL | 3 refills | Status: DC

## 2021-06-06 NOTE — Patient Instructions (Signed)
Medication Instructions:  START Ranexa 1000mg  twice daily START Spironolactone 25mg  once daily  *If you need a refill on your cardiac medications before your next appointment, please call your pharmacy*   Lab Work: BMET in about 2 weeks If you have labs (blood work) drawn today and your tests are completely normal, you will receive your results only by: [place] (if you have MyChart) OR A paper copy in the mail If you have any lab test that is abnormal or we need to change your treatment, we will call you to review the results.   Follow-Up: At [facility], you and your health needs are our priority.  As part of our continuing mission to provide you with exceptional heart care, we have created designated Provider Care Teams.  These Care Teams include your primary Cardiologist (physician) and Advanced Practice Providers (APPs -  Physician Assistants and Nurse Practitioners) who all work together to provide you with the care you need, when you need it.  Your next appointment:   3 month(s)  The format for your next appointment:   In Person  Provider:   Melina Copa, PA-C, Ermalinda Barrios, PA-C, or Richardson Dopp, PA-C       Then, Sherren Mocha, MD will plan to see you again in 1 year(s).{   Other Instructions You have been referred to PharmD for hypertension and Heart Failure medication management. Please try to coordinate with INR check.

## 2021-06-06 NOTE — Progress Notes (Signed)
Cardiology Office Note:    Date:  06/07/2021   ID:  [name], DOB [birth year], MRN [id number]  PCP:  Delsa Grana, PA-C   [facility] Providers Cardiologist:  Sherren Mocha, MD Electrophysiologist:  Virl Axe, MD     Referring MD: Delsa Grana, PA-C   Chief Complaint  Patient presents with   Aortic Stenosis    History of Present Illness:    [name] is a 46 y.o. male with a hx of valvular heart disease and congestive heart failure, presenting for follow-up evaluation after recent cardiac surgery.  As a teenager the patient was found to have a VSD on a sports participation physical. He underwent surgical VSD repair at 46 years old ([place]). He was then found to have an aortic valve problem at age 50 and underwent bioprosthetic aortic valve replacement ([place]). During that surgery there was an injury to the RCA and he had an infarct at that time. He was found to have episodes of VT in 2008 and was treated with medical therapy and an ICD. He has had a few appropriate shocks for VT over the years and has had ATP therapies delivered as well. He was evaluated for VT ablation at [facility] but he was found to have an epicardial source and did not proceed with ablation.  He has gone on to develop progressive bioprosthetic aortic valve dysfunction with severe prosthetic valve stenosis with moderately severe LV systolic dysfunction.  He was ultimately treated with a third time redo median sternotomy and aortic valve replacement using a 23 mm ON-X mechanical valve on April 27, 2021.  His postoperative course was uncomplicated.  He was last seen by Dr. Cyndia Bent May 31, 2021 and was felt to be doing well.  Carvedilol was increased because of elevated blood pressure.  The patient is here alone today.  He has several complaints including soreness around his sternotomy, numbness in his hands and feet, and fatigue.  His blood pressure has been elevated.  He has  started back on Entresto because of continued blood pressure elevation after increasing carvedilol.  He denies orthopnea or PND.  He has had no shocks for VT.  He has had no lightheadedness or syncope.  Past Medical History:  Diagnosis Date   AICD (automatic cardioverter/defibrillator) present    Anxiety    Aortic valve replaced    Requiring replacement, specifics not available   Complication of anesthesia    N & V   Dysrhythmia    Hearing loss of both ears 12/22/2015   Heart murmur    Hypertension    IBD (inflammatory bowel disease) 09/23/2015   ICD (implantable cardiac defibrillator), BSX single    Myocardial infarction [facility])    Obesity 12/22/2015   PONV (postoperative nausea and vomiting)    Secondary cardiomyopathy ([place])    Syncope    Ventricular septal defect    Ventricular tachycardia    appropriate VT shock therapy /13    Past Surgical History:  Procedure Laterality Date   ANGIOPLASTY     RCA repair with vein angioplasty following injury with the aforementioned surgery   AORTIC VALVE REPLACEMENT     Bioprosthesis   AORTIC VALVE REPLACEMENT N/A 04/27/2021   Procedure: REDO AORTIC VALVE REPLACEMENT (AVR) USING ON-X 23MM AORTIC VALVE;  Surgeon: Gaye Pollack, MD;  Location: [place];  Service: Open Heart Surgery;  Laterality: N/A;   CARDIAC CATHETERIZATION     CARDIAC DEFIBRILLATOR PLACEMENT  CARDIAC VALVE REPLACEMENT     CHOLECYSTECTOMY     COLONOSCOPY WITH PROPOFOL N/A 12/06/2015   Procedure: COLONOSCOPY WITH PROPOFOL;  Surgeon: [name], MD;  Location: [facility] ENDOSCOPY;  Service: Endoscopy;  Laterality: N/A;   CORONARY ANGIOPLASTY     CORONARY ARTERY BYPASS GRAFT     IMPLANTABLE CARDIOVERTER DEFIBRILLATOR GENERATOR CHANGE N/A 05/05/2013   Procedure: IMPLANTABLE CARDIOVERTER DEFIBRILLATOR GENERATOR CHANGE;  Surgeon: Deboraha Sprang, MD;  Location: [facility] CATH LAB;  Service: Cardiovascular;  Laterality: N/A;   INSERT / REPLACE / REMOVE PACEMAKER     RIGHT/LEFT HEART  CATH AND CORONARY ANGIOGRAPHY N/A 03/01/2021   Procedure: RIGHT/LEFT HEART CATH AND CORONARY ANGIOGRAPHY;  Surgeon: Sherren Mocha, MD;  Location: [place] CV LAB;  Service: Cardiovascular;  Laterality: N/A;   TEE WITHOUT CARDIOVERSION N/A 04/27/2021   Procedure: TRANSESOPHAGEAL ECHOCARDIOGRAM (TEE);  Surgeon: Gaye Pollack, MD;  Location: [place];  Service: Open Heart Surgery;  Laterality: N/A;   VSD REPAIR      Current Medications: Current Meds  Medication Sig   acetaminophen (TYLENOL) 500 MG tablet Take 500 mg by mouth every 6 (six) hours as needed for moderate pain.   aspirin EC 81 MG EC tablet Take 1 tablet (81 mg total) by mouth daily. Swallow whole.   carvedilol (COREG) 12.5 MG tablet Take 12.5 mg by mouth 2 (two) times daily with a meal.   fluticasone (FLONASE) 50 MCG/ACT nasal spray Place 2 sprays into both nostrils daily as needed for allergies.   loratadine (CLARITIN) 10 MG tablet Take by mouth as needed.   magnesium oxide (MAG-OX) 400 (241.3 Mg) MG tablet Take 1 tablet (400 mg total) by mouth 2 (two) times daily.   meclizine (ANTIVERT) 25 MG tablet Take 0.5-1 tablets (12.5-25 mg total) by mouth 3 (three) times daily as needed for dizziness.   Multiple Vitamin (MULTIVITAMIN) tablet Take 2 tablets by mouth daily.   ondansetron (ZOFRAN-ODT) 4 MG disintegrating tablet Take by mouth as needed.   oxyCODONE (OXY IR/ROXICODONE) 5 MG immediate release tablet Take 1 tablet (5 mg total) by mouth every 4 (four) hours as needed for severe pain.   potassium chloride SA (KLOR-CON M) 20 MEQ tablet Take 1 tablet (20 mEq total) by mouth daily. For 4 days then stop.   ranolazine (RANEXA) 1000 MG SR tablet Take 1 tablet (1,000 mg total) by mouth 2 (two) times daily.   sacubitril-valsartan (ENTRESTO) 49-51 MG Take 1 tablet by mouth 2 (two) times daily.   spironolactone (ALDACTONE) 25 MG tablet Take 1 tablet (25 mg total) by mouth daily.   torsemide (DEMADEX) 20 MG tablet Take 1 tablet (20 mg total)  by mouth daily. For 4 days then stop.   triamcinolone cream (KENALOG) 0.1 % Apply 1 application topically 2 (two) times daily as needed.   umeclidinium bromide (INCRUSE ELLIPTA) 62.5 MCG/ACT AEPB Inhale 1 puff into the lungs as needed.   warfarin (COUMADIN) 5 MG tablet Take 1 1/2 tablets by mouth daily or as directed by the coumadin clinic     Allergies:   Shellfish allergy   Social History   Socioeconomic History   Marital status: Married    Spouse name: Anderson Malta   Number of children: 2   Years of education: 17   Highest education level: Bachelor's degree (e.g., BA, AB, BS)  Occupational History   Occupation: Full time     Employer: [employer]    Comment: In IT dept at [employer]  Tobacco Use   Smoking  status: Never   Smokeless tobacco: Never  Vaping Use   Vaping Use: Never used  Substance and Sexual Activity   Alcohol use: No   Drug use: No   Sexual activity: Yes    Partners: Female  Other Topics Concern   Not on file  Social History Narrative   Married   Social Determinants of Health   Financial Resource Strain: Not on file  Food Insecurity: Not on file  Transportation Needs: Not on file  Physical Activity: Not on file  Stress: Not on file  Social Connections: Not on file     Family History: The patient's family history includes Cancer in his maternal grandfather and maternal grandmother; Heart disease in his father; Hypertension in his maternal grandfather and maternal grandmother.  ROS:   Please see the history of present illness.    All other systems reviewed and are negative.  EKGs/Labs/Other Studies Reviewed:    The following studies were reviewed today: Echocardiogram 01/20/2021:  1. Bioprosthetic aortic valve degenerative stenosis.   2. Left ventricular ejection fraction, by estimation, is 35 to 40%. The  left ventricle has moderately decreased function. The left ventricle  demonstrates global hypokinesis. The left ventricular internal cavity size  was  mildly dilated. There is mild  left ventricular hypertrophy. Left ventricular diastolic parameters are  indeterminate.   3. Right ventricular systolic function is normal. The right ventricular  size is normal.   4. The mitral valve is normal in structure. No evidence of mitral valve  regurgitation. No evidence of mitral stenosis.   5. The aortic valve has been repaired/replaced. Aortic valve  regurgitation is mild. Severe aortic valve stenosis. There is a  bioprosthetic valve present in the aortic position. Procedure Date: 2005.  Aortic valve area, by VTI measures 0.69 cm. Aortic  valve mean gradient measures 39.3 mmHg. Aortic valve Vmax measures 3.93  m/s.   6. The inferior vena cava is normal in size with greater than 50%  respiratory variability, suggesting right atrial pressure of 3 mmHg.   Comparison(s): Prior aortic valve mean gradient was 33 mmHg, with Vmax of  3.88 m/s.   Cardiac catheterization 03/01/2021: Conclusion      Ost RCA to Prox RCA lesion is 100% stenosed.   There is severe aortic valve stenosis.   1.  Single-vessel coronary artery disease with total occlusion of the RCA, collateralized from the left coronary artery (this is known from the patient's history) 2.  Patent left main, LAD, and left circumflex with no obstructive coronary artery disease in those vessels.  The RCA is collateralized from septal perforators of the LAD 3.  Severe bioprosthetic aortic valve stenosis with mean gradient 57 mmHg, peak to peak gradient 74 mmHg, calculated aortic valve area 0.7 cm 4.  Normal right heart hemodynamics and preserved cardiac output   Recommendations: Cardiac surgical referral for consideration of redo aortic valve replacement  EKG:  EKG is not ordered today.    Recent Labs: 04/25/2021: ALT 15 04/28/2021: Magnesium 2.4 05/01/2021: Hemoglobin 7.5; Platelets 158 05/02/2021: BUN 16; Creatinine, Ser 1.28; Potassium 3.6; Sodium 138  Recent Lipid Panel    Component  Value Date/Time   CHOL 156 07/12/2020 1135   TRIG 110 07/12/2020 1135   HDL 38 (L) 07/12/2020 1135   CHOLHDL 4.1 07/12/2020 1135   CHOLHDL 4.5 02/17/2020 1130   LDLCALC 98 07/12/2020 1135   LDLCALC 106 (H) 02/17/2020 1130     Risk Assessment/Calculations:  Physical Exam:    VS:  BP (!) 140/100    Pulse 88    Ht 5\' 10"  (1.778 m)    Wt 198 lb (89.8 kg)    SpO2 98%    BMI 28.41 kg/m     Wt Readings from Last 3 Encounters:  06/06/21 198 lb (89.8 kg)  05/31/21 206 lb (93.4 kg)  05/03/21 199 lb 11.8 oz (90.6 kg)     GEN:  Well nourished, well developed in no acute distress HEENT: Normal NECK: No JVD; No carotid bruits LYMPHATICS: No lymphadenopathy CARDIAC: RRR, normal mechanical A2. RESPIRATORY:  Clear to auscultation without rales, wheezing or rhonchi  ABDOMEN: Soft, non-tender, non-distended MUSCULOSKELETAL:  No edema; No deformity  SKIN: Warm and dry NEUROLOGIC:  Alert and oriented x 3 PSYCHIATRIC:  Normal affect   ASSESSMENT:    1. Severe aortic stenosis   2. Ventricular tachycardia   3. Coronary artery disease involving native coronary artery of native heart without angina pectoris   4. Chronic systolic heart failure ([place])   5. Essential hypertension    PLAN:    In order of problems listed above:  Now status post mechanical aortic valve replacement.  Tolerating aspirin 81 mg, after 3 months his ultimate INR goal will be 1.5-2.0 with an ON-X valve.  Postoperative echocardiogram is scheduled for tomorrow. No intercurrent shocks.  Device interrogated today. Remote inferior infarct related to surgical complication.  Recent catheterization showed patency of the left main, LAD, and left circumflex.  No anginal symptoms. Repeat echocardiogram tomorrow.  Hopefully will have some improvement in LV function after aortic valve replacement.  GDMT includes use of carvedilol and Entresto.  Will resume spironolactone at a dose of 25 mg daily. Blood pressure is above  goal.  Add spironolactone 25 mg daily.  He has only been back on Entresto for a few days.  We will arrange lab work and a follow-up visit in our pharmacy clinic in 2 weeks for continued medicine titration.      Medication Adjustments/Labs and Tests Ordered: Current medicines are reviewed at length with the patient today.  Concerns regarding medicines are outlined above.  Orders Placed This Encounter  Procedures   Basic metabolic panel   AMB Referral to [facility] Pharm-D   Meds ordered this encounter  Medications   spironolactone (ALDACTONE) 25 MG tablet    Sig: Take 1 tablet (25 mg total) by mouth daily.    Dispense:  90 tablet    Refill:  3   ranolazine (RANEXA) 1000 MG SR tablet    Sig: Take 1 tablet (1,000 mg total) by mouth 2 (two) times daily.    Dispense:  180 tablet    Refill:  3    Patient Instructions  Medication Instructions:  START Ranexa 1000mg  twice daily START Spironolactone 25mg  once daily  *If you need a refill on your cardiac medications before your next appointment, please call your pharmacy*   Lab Work: BMET in about 2 weeks If you have labs (blood work) drawn today and your tests are completely normal, you will receive your results only by: [place] (if you have MyChart) OR A paper copy in the mail If you have any lab test that is abnormal or we need to change your treatment, we will call you to review the results.   Follow-Up: At [facility], you and your health needs are our priority.  As part of our continuing mission to provide you with exceptional heart care, we have created  designated Provider Care Teams.  These Care Teams include your primary Cardiologist (physician) and Advanced Practice Providers (APPs -  Physician Assistants and Nurse Practitioners) who all work together to provide you with the care you need, when you need it.  Your next appointment:   3 month(s)  The format for your next appointment:   In Person  Provider:    Melina Copa, PA-C, Ermalinda Barrios, PA-C, or Richardson Dopp, PA-C       Then, Sherren Mocha, MD will plan to see you again in 1 year(s).{   Other Instructions You have been referred to PharmD for hypertension and Heart Failure medication management. Please try to coordinate with INR check.     Signed, Sherren Mocha, MD  06/07/2021 7:41 AM    [place]

## 2021-06-07 ENCOUNTER — Ambulatory Visit (HOSPITAL_COMMUNITY): Payer: 59 | Attending: Interventional Cardiology

## 2021-06-07 ENCOUNTER — Encounter: Payer: Self-pay | Admitting: Cardiovascular Disease

## 2021-06-07 DIAGNOSIS — I359 Nonrheumatic aortic valve disorder, unspecified: Secondary | ICD-10-CM | POA: Insufficient documentation

## 2021-06-07 LAB — ECHOCARDIOGRAM COMPLETE
AR max vel: 1.35 cm2
AV Area VTI: 1.68 cm2
AV Area mean vel: 1.28 cm2
AV Mean grad: 8 mmHg
AV Peak grad: 13.7 mmHg
Ao pk vel: 1.85 m/s
Area-P 1/2: 4.96 cm2
S' Lateral: 5.3 cm

## 2021-06-09 ENCOUNTER — Other Ambulatory Visit: Payer: Self-pay

## 2021-06-09 ENCOUNTER — Ambulatory Visit: Payer: 59 | Admitting: *Deleted

## 2021-06-09 DIAGNOSIS — Z5181 Encounter for therapeutic drug level monitoring: Secondary | ICD-10-CM | POA: Diagnosis not present

## 2021-06-09 DIAGNOSIS — I350 Nonrheumatic aortic (valve) stenosis: Secondary | ICD-10-CM | POA: Diagnosis not present

## 2021-06-09 DIAGNOSIS — Z952 Presence of prosthetic heart valve: Secondary | ICD-10-CM | POA: Diagnosis not present

## 2021-06-09 DIAGNOSIS — Z7901 Long term (current) use of anticoagulants: Secondary | ICD-10-CM

## 2021-06-09 LAB — CUP PACEART INCLINIC DEVICE CHECK
Date Time Interrogation Session: 20230117000000
HighPow Impedance: 38 Ohm
HighPow Impedance: 45 Ohm
Implantable Lead Implant Date: 20080214
Implantable Lead Location: 753860
Implantable Lead Model: 185
Implantable Lead Serial Number: 178017
Implantable Pulse Generator Implant Date: 20141216
Lead Channel Impedance Value: 566 Ohm
Lead Channel Pacing Threshold Amplitude: 0.9 V
Lead Channel Pacing Threshold Pulse Width: 0.4 ms
Lead Channel Sensing Intrinsic Amplitude: 10.3 mV
Lead Channel Setting Pacing Amplitude: 2 V
Lead Channel Setting Pacing Pulse Width: 0.4 ms
Lead Channel Setting Sensing Sensitivity: 0.6 mV
Pulse Gen Serial Number: 126781

## 2021-06-09 LAB — POCT INR: INR: 2.4 (ref 2.0–3.0)

## 2021-06-09 NOTE — Patient Instructions (Signed)
Description   Continue taking warfarin 1.5 tablets daily except 2 tablets every Wednesday. Recheck INR in 2 weeks. Be consistent with green (2 times per week)  Coumadin Clinic [phone]

## 2021-06-09 NOTE — Progress Notes (Signed)
ICD check in clinic. per SK f/u from low shock impedance following valve surgery. Normal device function. Thresholds and sensing consistent with previous device measurements. Histogram distribution appropriate for patient and level of activity. No changes made this session. Defib lead impedance in office measured at 45 ohms, implant measurement 50ohms.

## 2021-06-15 ENCOUNTER — Other Ambulatory Visit: Payer: Self-pay

## 2021-06-15 MED ORDER — WARFARIN SODIUM 5 MG PO TABS: ORAL_TABLET | ORAL | 1 refills | Status: DC

## 2021-06-15 NOTE — Telephone Encounter (Signed)
Prescription refill request received for warfarin Lov: 06/06/21 Burt Knack)  Next INR check: 06/20/21 Warfarin tablet strength: 5mg   Appropriate dose and refill sent to requested pharmacy.

## 2021-06-17 ENCOUNTER — Other Ambulatory Visit: Payer: Self-pay | Admitting: Cardiovascular Disease

## 2021-06-19 ENCOUNTER — Telehealth: Payer: Self-pay

## 2021-06-19 ENCOUNTER — Other Ambulatory Visit: Payer: Self-pay

## 2021-06-19 ENCOUNTER — Other Ambulatory Visit
Admission: RE | Admit: 2021-06-19 | Discharge: 2021-06-19 | Disposition: A | Discharge status: Home / Self Care | Payer: 59 | Attending: Cardiovascular Disease | Admitting: Cardiovascular Disease

## 2021-06-19 ENCOUNTER — Encounter: Payer: 59 | Admitting: *Deleted

## 2021-06-19 VITALS — Ht 70.75 in | Wt 191.5 lb

## 2021-06-19 DIAGNOSIS — I472 Ventricular tachycardia, unspecified: Secondary | ICD-10-CM | POA: Insufficient documentation

## 2021-06-19 DIAGNOSIS — Z952 Presence of prosthetic heart valve: Secondary | ICD-10-CM

## 2021-06-19 DIAGNOSIS — I5022 Chronic systolic (congestive) heart failure: Secondary | ICD-10-CM | POA: Insufficient documentation

## 2021-06-19 DIAGNOSIS — I350 Nonrheumatic aortic (valve) stenosis: Secondary | ICD-10-CM | POA: Diagnosis not present

## 2021-06-19 DIAGNOSIS — I2510 Atherosclerotic heart disease of native coronary artery without angina pectoris: Secondary | ICD-10-CM | POA: Insufficient documentation

## 2021-06-19 DIAGNOSIS — I10 Essential (primary) hypertension: Secondary | ICD-10-CM | POA: Insufficient documentation

## 2021-06-19 LAB — BASIC METABOLIC PANEL WITH GFR
Anion gap: 5 (ref 5–15)
BUN: 15 mg/dL (ref 6–20)
CO2: 23 mmol/L (ref 22–32)
Calcium: 8.9 mg/dL (ref 8.9–10.3)
Chloride: 105 mmol/L (ref 98–111)
Creatinine, Ser: 1.17 mg/dL (ref 0.61–1.24)
GFR, Estimated: >60 mL/min
Glucose, Bld: 104 mg/dL — ABNORMAL HIGH (ref 70–99)
Potassium: 4 mmol/L (ref 3.5–5.1)
Sodium: 133 mmol/L — ABNORMAL LOW (ref 135–145)

## 2021-06-19 NOTE — Patient Instructions (Signed)
Patient Instructions  Patient Details  Name: [name] MRN: [id number] Date of Birth: Jan 09, 1976 Referring Provider:  Deboraha Sprang, MD  Below are your personal goals for exercise, nutrition, and risk factors. Our goal is to help you stay on track towards obtaining and maintaining these goals. We will be discussing your progress on these goals with you throughout the program.  Initial Exercise Prescription:  Initial Exercise Prescription - 06/19/21 1000       Date of Initial Exercise RX and Referring Provider   Date 06/19/21    Referring Provider Dr. Virl Axe      Oxygen   Maintain Oxygen Saturation 88% or higher      Treadmill   MPH 3.5    Grade 1    Minutes 15    METs 4.16      Recumbant Bike   Level 3    RPM 60    Minutes 15    METs 4.2      Recumbant Elliptical   Level 2    RPM 50    Minutes 15    METs 4.2      REL-XR   Level 3    Speed 50    Minutes 15    METs 4.2      Prescription Details   Frequency (times per week) 3    Duration Progress to 30 minutes of continuous aerobic without signs/symptoms of physical distress      Intensity   THRR 40-80% of Max Heartrate 117-154    Ratings of Perceived Exertion 11-13    Perceived Dyspnea 0-4      Progression   Progression Continue to progress workloads to maintain intensity without signs/symptoms of physical distress.      Resistance Training   Training Prescription Yes    Weight 3    Reps 10-15             Exercise Goals: Frequency: Be able to perform aerobic exercise two to three times per week in program working toward 2-5 days per week of home exercise.  Intensity: Work with a perceived exertion of 11 (fairly light) - 15 (hard) while following your exercise prescription.  We will make changes to your prescription with you as you progress through the program.   Duration: Be able to do 30 to 45 minutes of continuous aerobic exercise in addition to a 5 minute warm-up and a 5 minute  cool-down routine.   Nutrition Goals: Your personal nutrition goals will be established when you do your nutrition analysis with the dietician.  The following are general nutrition guidelines to follow: Cholesterol < 200mg /day Sodium < 1500mg /day Fiber: Men under 50 yrs - 38 grams per day  Personal Goals:  Personal Goals and Risk Factors at Admission - 06/19/21 1105       Core Components/Risk Factors/Patient Goals on Admission    Weight Management Yes;Weight Loss    Intervention Weight Management: Develop a combined nutrition and exercise program designed to reach desired caloric intake, while maintaining appropriate intake of nutrient and fiber, sodium and fats, and appropriate energy expenditure required for the weight goal.;Weight Management: Provide education and appropriate resources to help participant work on and attain dietary goals.;Weight Management/Obesity: Establish reasonable short term and long term weight goals.    Admit Weight 191 lb 8 oz (86.9 kg)    Goal Weight: Short Term 185 lb (83.9 kg)    Goal Weight: Long Term 185 lb (83.9 kg)    Expected Outcomes  Long Term: Adherence to nutrition and physical activity/exercise program aimed toward attainment of established weight goal;Short Term: Continue to assess and modify interventions until short term weight is achieved;Understanding recommendations for meals to include 15-35% energy as protein, 25-35% energy from fat, 35-60% energy from carbohydrates, less than 200mg  of dietary cholesterol, 20-35 gm of total fiber daily;Understanding of distribution of calorie intake throughout the day with the consumption of 4-5 meals/snacks;Weight Loss: Understanding of general recommendations for a balanced deficit meal plan, which promotes 1-2 lb weight loss per week and includes a negative energy balance of [phone] kcal/d    Heart Failure Yes    Intervention Provide a combined exercise and nutrition program that is supplemented with  education, support and counseling about heart failure. Directed toward relieving symptoms such as shortness of breath, decreased exercise tolerance, and extremity edema.    Expected Outcomes Improve functional capacity of life;Short term: Attendance in program 2-3 days a week with increased exercise capacity. Reported lower sodium intake. Reported increased fruit and vegetable intake. Reports medication compliance.;Short term: Daily weights obtained and reported for increase. Utilizing diuretic protocols set by physician.;Long term: Adoption of self-care skills and reduction of barriers for early signs and symptoms recognition and intervention leading to self-care maintenance.    Hypertension Yes    Intervention Provide education on lifestyle modifcations including regular physical activity/exercise, weight management, moderate sodium restriction and increased consumption of fresh fruit, vegetables, and low fat dairy, alcohol moderation, and smoking cessation.;Monitor prescription use compliance.    Expected Outcomes Short Term: Continued assessment and intervention until BP is < 140/65mm HG in hypertensive participants. < 130/77mm HG in hypertensive participants with diabetes, heart failure or chronic kidney disease.;Long Term: Maintenance of blood pressure at goal levels.             Tobacco Use Initial Evaluation: Social History   Tobacco Use  Smoking Status Never  Smokeless Tobacco Never    Exercise Goals and Review:  Exercise Goals     Row Name 06/19/21 1103             Exercise Goals   Increase Physical Activity Yes       Intervention Provide advice, education, support and counseling about physical activity/exercise needs.;Develop an individualized exercise prescription for aerobic and resistive training based on initial evaluation findings, risk stratification, comorbidities and participant's personal goals.       Expected Outcomes Short Term: Attend rehab on a regular basis to  increase amount of physical activity.;Long Term: Add in home exercise to make exercise part of routine and to increase amount of physical activity.;Long Term: Exercising regularly at least 3-5 days a week.       Increase Strength and Stamina Yes       Intervention Provide advice, education, support and counseling about physical activity/exercise needs.;Develop an individualized exercise prescription for aerobic and resistive training based on initial evaluation findings, risk stratification, comorbidities and participant's personal goals.       Expected Outcomes Short Term: Increase workloads from initial exercise prescription for resistance, speed, and METs.;Short Term: Perform resistance training exercises routinely during rehab and add in resistance training at home;Long Term: Improve cardiorespiratory fitness, muscular endurance and strength as measured by increased METs and functional capacity (6MWT)       Able to understand and use rate of perceived exertion (RPE) scale Yes       Intervention Provide education and explanation on how to use RPE scale       Expected Outcomes Short  Term: Able to use RPE daily in rehab to express subjective intensity level;Long Term:  Able to use RPE to guide intensity level when exercising independently       Able to understand and use Dyspnea scale Yes       Intervention Provide education and explanation on how to use Dyspnea scale       Expected Outcomes Short Term: Able to use Dyspnea scale daily in rehab to express subjective sense of shortness of breath during exertion;Long Term: Able to use Dyspnea scale to guide intensity level when exercising independently       Knowledge and understanding of Target Heart Rate Range (THRR) Yes       Intervention Provide education and explanation of THRR including how the numbers were predicted and where they are located for reference       Expected Outcomes Short Term: Able to state/look up THRR;Long Term: Able to use THRR to  govern intensity when exercising independently;Short Term: Able to use daily as guideline for intensity in rehab       Able to check pulse independently Yes       Intervention Provide education and demonstration on how to check pulse in carotid and radial arteries.;Review the importance of being able to check your own pulse for safety during independent exercise       Expected Outcomes Short Term: Able to explain why pulse checking is important during independent exercise;Long Term: Able to check pulse independently and accurately       Understanding of Exercise Prescription Yes       Intervention Provide education, explanation, and written materials on patient's individual exercise prescription       Expected Outcomes Short Term: Able to explain program exercise prescription;Long Term: Able to explain home exercise prescription to exercise independently                Copy of goals given to participant.

## 2021-06-19 NOTE — Progress Notes (Signed)
Cardiac Individual Treatment Plan  Patient Details  Name: [name] MRN: [id number] Date of Birth: May 12, 1976 Referring Provider:   Flowsheet Row Cardiac Rehab from 06/19/2021 in [facility] Cardiac and Pulmonary Rehab  Referring Provider Dr. Virl Axe       Initial Encounter Date:  Flowsheet Row Cardiac Rehab from 06/19/2021 in [facility] Cardiac and Pulmonary Rehab  Date 06/19/21       Visit Diagnosis: S/P AVR (aortic valve replacement)  Patient's Home Medications on Admission:  Current Outpatient Medications:    acetaminophen (TYLENOL) 500 MG tablet, Take 500 mg by mouth every 6 (six) hours as needed for moderate pain., Disp: , Rfl:    aspirin EC 81 MG EC tablet, Take 1 tablet (81 mg total) by mouth daily. Swallow whole., Disp: 30 tablet, Rfl: 11   carvedilol (COREG) 12.5 MG tablet, Take 12.5 mg by mouth 2 (two) times daily with a meal., Disp: , Rfl:    carvedilol (COREG) 6.25 MG tablet, Take 1 tablet (6.25 mg total) by mouth 2 (two) times daily. (Patient not taking: Reported on 06/06/2021), Disp: 60 tablet, Rfl: 1   fluticasone (FLONASE) 50 MCG/ACT nasal spray, Place 2 sprays into both nostrils daily as needed for allergies., Disp: , Rfl:    fluticasone (FLONASE) 50 MCG/ACT nasal spray, Place into the nose. (Patient not taking: Reported on 06/06/2021), Disp: , Rfl:    loratadine (CLARITIN) 10 MG tablet, Take by mouth as needed., Disp: , Rfl:    magnesium oxide (MAG-OX) 400 (241.3 Mg) MG tablet, Take 1 tablet (400 mg total) by mouth 2 (two) times daily., Disp: 60 tablet, Rfl: 0   meclizine (ANTIVERT) 25 MG tablet, Take 0.5-1 tablets (12.5-25 mg total) by mouth 3 (three) times daily as needed for dizziness., Disp: 30 tablet, Rfl: 2   Multiple Vitamin (MULTIVITAMIN) tablet, Take 2 tablets by mouth daily., Disp: , Rfl:    ondansetron (ZOFRAN-ODT) 4 MG disintegrating tablet, Take by mouth as needed., Disp: , Rfl:    oxyCODONE (OXY IR/ROXICODONE) 5 MG immediate release tablet, Take 1 tablet (5  mg total) by mouth every 4 (four) hours as needed for severe pain., Disp: 30 tablet, Rfl: 0   potassium chloride SA (KLOR-CON M) 20 MEQ tablet, Take 1 tablet (20 mEq total) by mouth daily. For 4 days then stop., Disp: 4 tablet, Rfl: 0   ranolazine (RANEXA) 1000 MG SR tablet, Take 1 tablet (1,000 mg total) by mouth 2 (two) times daily., Disp: 180 tablet, Rfl: 3   sacubitril-valsartan (ENTRESTO) 49-51 MG, Take 1 tablet by mouth 2 (two) times daily., Disp: , Rfl:    spironolactone (ALDACTONE) 25 MG tablet, Take 1 tablet (25 mg total) by mouth daily., Disp: 90 tablet, Rfl: 3   torsemide (DEMADEX) 20 MG tablet, Take 1 tablet (20 mg total) by mouth daily. For 4 days then stop., Disp: 4 tablet, Rfl: 0   triamcinolone cream (KENALOG) 0.1 %, Apply 1 application topically 2 (two) times daily as needed., Disp: 30 g, Rfl: 0   triamcinolone cream (KENALOG) 0.1 %, Apply topically as needed. (Patient not taking: Reported on 06/06/2021), Disp: , Rfl:    umeclidinium bromide (INCRUSE ELLIPTA) 62.5 MCG/ACT AEPB, Inhale 1 puff into the lungs as needed., Disp: , Rfl:    warfarin (COUMADIN) 5 MG tablet, TAKE 1 AND 1/2 TABLETS BY MOUTH DAILY OR AS DIRECTED BY THE COUMADIN CLINIC, Disp: 50 tablet, Rfl: 3  Past Medical History: Past Medical History:  Diagnosis Date   AICD (automatic cardioverter/defibrillator) present  Anxiety    Aortic valve replaced    Requiring replacement, specifics not available   Complication of anesthesia    N & V   Dysrhythmia    Hearing loss of both ears 12/22/2015   Heart murmur    Hypertension    IBD (inflammatory bowel disease) 09/23/2015   ICD (implantable cardiac defibrillator), BSX single    Myocardial infarction [facility])    Obesity 12/22/2015   PONV (postoperative nausea and vomiting)    Secondary cardiomyopathy ([place])    Syncope    Ventricular septal defect    Ventricular tachycardia    appropriate VT shock therapy /13    Tobacco Use: Social History   Tobacco Use   Smoking Status Never  Smokeless Tobacco Never    Labs: Recent Review Flowsheet Data     Labs for ITP Cardiac and Pulmonary Rehab Latest Ref Rng & Units 04/27/2021 04/27/2021 04/27/2021 04/28/2021 04/28/2021   Cholestrol 100 - 199 mg/dL - - - - -   LDLCALC 0 - 99 mg/dL - - - - -   HDL >39 mg/dL - - - - -   Trlycerides 0 - 149 mg/dL - - - - -   Hemoglobin A1c 4.8 - 5.6 % - - - - -   PHART 7.350 - 7.450 7.408 7.413 7.386 7.334(L) 7.334(L)   PCO2ART 32.0 - 48.0 mmHg 39.2 37.0 43.8 47.0 48.6(H)   HCO3 20.0 - 28.0 mmol/L 24.7 24.3 26.3 25.0 25.9   TCO2 22 - 32 mmol/L 26 26 28 26 27    ACIDBASEDEF 0.0 - 2.0 mmol/L - 1.0 - 1.0 -   O2SAT % 100.0 100.0 100.0 96.0 98.0        Exercise Target Goals: Exercise Program Goal: Individual exercise prescription set using results from initial 6 min walk test and THRR while considering  patients activity barriers and safety.   Exercise Prescription Goal: Initial exercise prescription builds to 30-45 minutes a day of aerobic activity, 2-3 days per week.  Home exercise guidelines will be given to patient during program as part of exercise prescription that the participant will acknowledge.   Education: Aerobic Exercise: - Group verbal and visual presentation on the components of exercise prescription. Introduces F.I.T.T principle from ACSM for exercise prescriptions.  Reviews F.I.T.T. principles of aerobic exercise including progression. Written material given at graduation.   Education: Resistance Exercise: - Group verbal and visual presentation on the components of exercise prescription. Introduces F.I.T.T principle from ACSM for exercise prescriptions  Reviews F.I.T.T. principles of resistance exercise including progression. Written material given at graduation.    Education: Exercise & Equipment Safety: - Individual verbal instruction and demonstration of equipment use and safety with use of the equipment. Flowsheet Row Cardiac Rehab from  06/19/2021 in [facility] Cardiac and Pulmonary Rehab  Date 06/19/21  Educator [facility]  Instruction Review Code 1- Verbalizes Understanding       Education: Exercise Physiology & General Exercise Guidelines: - Group verbal and written instruction with models to review the exercise physiology of the cardiovascular system and associated critical values. Provides general exercise guidelines with specific guidelines to those with heart or lung disease.    Education: Flexibility, Balance, Mind/Body Relaxation: - Group verbal and visual presentation with interactive activity on the components of exercise prescription. Introduces F.I.T.T principle from ACSM for exercise prescriptions. Reviews F.I.T.T. principles of flexibility and balance exercise training including progression. Also discusses the mind body connection.  Reviews various relaxation techniques to help reduce and manage stress (i.e. Deep breathing,  progressive muscle relaxation, and visualization). Balance handout provided to take home. Written material given at graduation.   Activity Barriers & Risk Stratification:   6 Minute Walk:  6 Minute Walk     Row Name 06/19/21 1057         6 Minute Walk   Phase Initial     Distance 1300 feet     Walk Time 6 minutes     # of Rest Breaks 0     MPH 2.46     METS 4.2     RPE 7     VO2 Peak 14.7     Symptoms No     Resting HR 80 bpm     Resting BP 100/70     Resting Oxygen Saturation  96 %     Exercise Oxygen Saturation  during 6 min walk 99 %     Max Ex. HR 100 bpm     Max Ex. BP 124/78     2 Minute Post BP 104/64              Oxygen Initial Assessment:   Oxygen Re-Evaluation:   Oxygen Discharge (Final Oxygen Re-Evaluation):   Initial Exercise Prescription:  Initial Exercise Prescription - 06/19/21 1000       Date of Initial Exercise RX and Referring Provider   Date 06/19/21    Referring Provider Dr. Virl Axe      Oxygen   Maintain Oxygen Saturation 88% or higher       Treadmill   MPH 3.5    Grade 1    Minutes 15    METs 4.16      Recumbant Bike   Level 3    RPM 60    Minutes 15    METs 4.2      Recumbant Elliptical   Level 2    RPM 50    Minutes 15    METs 4.2      REL-XR   Level 3    Speed 50    Minutes 15    METs 4.2      Prescription Details   Frequency (times per week) 3    Duration Progress to 30 minutes of continuous aerobic without signs/symptoms of physical distress      Intensity   THRR 40-80% of Max Heartrate 117-154    Ratings of Perceived Exertion 11-13    Perceived Dyspnea 0-4      Progression   Progression Continue to progress workloads to maintain intensity without signs/symptoms of physical distress.      Resistance Training   Training Prescription Yes    Weight 3    Reps 10-15             Perform Capillary Blood Glucose checks as needed.  Exercise Prescription Changes:   Exercise Prescription Changes     Row Name 06/19/21 1100             Response to Exercise   Blood Pressure (Admit) 100/70       Blood Pressure (Exercise) 124/78       Blood Pressure (Exit) 104/64       Heart Rate (Admit) 80 bpm       Heart Rate (Exercise) 100 bpm       Heart Rate (Exit) 82 bpm       Oxygen Saturation (Admit) 96 %       Oxygen Saturation (Exercise) 99 %       Oxygen Saturation (Exit) 98 %  Rating of Perceived Exertion (Exercise) 7       Symptoms none       Comments 6 MWT results         Resistance Training   Training Prescription Yes       Weight 3       Reps 10-15         Treadmill   MPH 3.5       Grade 1       Minutes 15       METs 4.16         Recumbant Bike   Level 3       RPM 60       Minutes 15       METs 4.2         Recumbant Elliptical   Level 2       RPM 50       Minutes 15       METs 4.2         REL-XR   Level 3       Speed 50       Minutes 15       METs 4.2                Exercise Comments:   Exercise Goals and Review:   Exercise Goals     Row  Name 06/19/21 1103             Exercise Goals   Increase Physical Activity Yes       Intervention Provide advice, education, support and counseling about physical activity/exercise needs.;Develop an individualized exercise prescription for aerobic and resistive training based on initial evaluation findings, risk stratification, comorbidities and participant's personal goals.       Expected Outcomes Short Term: Attend rehab on a regular basis to increase amount of physical activity.;Long Term: Add in home exercise to make exercise part of routine and to increase amount of physical activity.;Long Term: Exercising regularly at least 3-5 days a week.       Increase Strength and Stamina Yes       Intervention Provide advice, education, support and counseling about physical activity/exercise needs.;Develop an individualized exercise prescription for aerobic and resistive training based on initial evaluation findings, risk stratification, comorbidities and participant's personal goals.       Expected Outcomes Short Term: Increase workloads from initial exercise prescription for resistance, speed, and METs.;Short Term: Perform resistance training exercises routinely during rehab and add in resistance training at home;Long Term: Improve cardiorespiratory fitness, muscular endurance and strength as measured by increased METs and functional capacity (6MWT)       Able to understand and use rate of perceived exertion (RPE) scale Yes       Intervention Provide education and explanation on how to use RPE scale       Expected Outcomes Short Term: Able to use RPE daily in rehab to express subjective intensity level;Long Term:  Able to use RPE to guide intensity level when exercising independently       Able to understand and use Dyspnea scale Yes       Intervention Provide education and explanation on how to use Dyspnea scale       Expected Outcomes Short Term: Able to use Dyspnea scale daily in rehab to express  subjective sense of shortness of breath during exertion;Long Term: Able to use Dyspnea scale to guide intensity level when exercising independently       Knowledge  and understanding of Target Heart Rate Range (THRR) Yes       Intervention Provide education and explanation of THRR including how the numbers were predicted and where they are located for reference       Expected Outcomes Short Term: Able to state/look up THRR;Long Term: Able to use THRR to govern intensity when exercising independently;Short Term: Able to use daily as guideline for intensity in rehab       Able to check pulse independently Yes       Intervention Provide education and demonstration on how to check pulse in carotid and radial arteries.;Review the importance of being able to check your own pulse for safety during independent exercise       Expected Outcomes Short Term: Able to explain why pulse checking is important during independent exercise;Long Term: Able to check pulse independently and accurately       Understanding of Exercise Prescription Yes       Intervention Provide education, explanation, and written materials on patient's individual exercise prescription       Expected Outcomes Short Term: Able to explain program exercise prescription;Long Term: Able to explain home exercise prescription to exercise independently                Exercise Goals Re-Evaluation :   Discharge Exercise Prescription (Final Exercise Prescription Changes):  Exercise Prescription Changes - 06/19/21 1100       Response to Exercise   Blood Pressure (Admit) 100/70    Blood Pressure (Exercise) 124/78    Blood Pressure (Exit) 104/64    Heart Rate (Admit) 80 bpm    Heart Rate (Exercise) 100 bpm    Heart Rate (Exit) 82 bpm    Oxygen Saturation (Admit) 96 %    Oxygen Saturation (Exercise) 99 %    Oxygen Saturation (Exit) 98 %    Rating of Perceived Exertion (Exercise) 7    Symptoms none    Comments 6 MWT results       Resistance Training   Training Prescription Yes    Weight 3    Reps 10-15      Treadmill   MPH 3.5    Grade 1    Minutes 15    METs 4.16      Recumbant Bike   Level 3    RPM 60    Minutes 15    METs 4.2      Recumbant Elliptical   Level 2    RPM 50    Minutes 15    METs 4.2      REL-XR   Level 3    Speed 50    Minutes 15    METs 4.2             Nutrition:  Target Goals: Understanding of nutrition guidelines, daily intake of sodium 1500mg , cholesterol 200mg , calories 30% from fat and 7% or less from saturated fats, daily to have 5 or more servings of fruits and vegetables.  Education: All About Nutrition: -Group instruction provided by verbal, written material, interactive activities, discussions, models, and posters to present general guidelines for heart healthy nutrition including fat, fiber, MyPlate, the role of sodium in heart healthy nutrition, utilization of the nutrition label, and utilization of this knowledge for meal planning. Follow up email sent as well. Written material given at graduation. Flowsheet Row Cardiac Rehab from 06/19/2021 in [facility] Cardiac and Pulmonary Rehab  Education need identified 06/19/21       Biometrics:  Pre Biometrics -  06/19/21 1103       Pre Biometrics   Height 5' 10.75" (1.797 m)    Weight 191 lb 8 oz (86.9 kg)    BMI (Calculated) 26.9    Single Leg Stand 30 seconds              Nutrition Therapy Plan and Nutrition Goals:  Nutrition Therapy & Goals - 06/19/21 1104       Intervention Plan   Intervention Prescribe, educate and counsel regarding individualized specific dietary modifications aiming towards targeted core components such as weight, hypertension, lipid management, diabetes, heart failure and other comorbidities.    Expected Outcomes Short Term Goal: Understand basic principles of dietary content, such as calories, fat, sodium, cholesterol and nutrients.;Short Term Goal: A plan has been developed with  personal nutrition goals set during dietitian appointment.;Long Term Goal: Adherence to prescribed nutrition plan.             Nutrition Assessments:  MEDIFICTS Score Key: ?70 Need to make dietary changes  40-70 Heart Healthy Diet ? 40 Therapeutic Level Cholesterol Diet  Flowsheet Row Cardiac Rehab from 06/19/2021 in [facility] Cardiac and Pulmonary Rehab  Picture Your Plate Total Score on Admission 64      Picture Your Plate Scores: <44 Unhealthy dietary pattern with much room for improvement. 41-50 Dietary pattern unlikely to meet recommendations for good health and room for improvement. 51-60 More healthful dietary pattern, with some room for improvement.  >60 Healthy dietary pattern, although there may be some specific behaviors that could be improved.    Nutrition Goals Re-Evaluation:   Nutrition Goals Discharge (Final Nutrition Goals Re-Evaluation):   Psychosocial: Target Goals: Acknowledge presence or absence of significant depression and/or stress, maximize coping skills, provide positive support system. Participant is able to verbalize types and ability to use techniques and skills needed for reducing stress and depression.   Education: Stress, Anxiety, and Depression - Group verbal and visual presentation to define topics covered.  Reviews how body is impacted by stress, anxiety, and depression.  Also discusses healthy ways to reduce stress and to treat/manage anxiety and depression.  Written material given at graduation.   Education: Sleep Hygiene -Provides group verbal and written instruction about how sleep can affect your health.  Define sleep hygiene, discuss sleep cycles and impact of sleep habits. Review good sleep hygiene tips.    Initial Review & Psychosocial Screening:  Initial Psych Review & Screening - 05/25/21 1008       Initial Review   Current issues with None Identified      Family Dynamics   Good Support System? Yes    Comments He can look to  his whole family for support. He has a good outlook on his health. He has not done much exercise before his event and is ready to get healthier.      Barriers   Psychosocial barriers to participate in program There are no identifiable barriers or psychosocial needs.;The patient should benefit from training in stress management and relaxation.      Screening Interventions   Interventions Encouraged to exercise;To provide support and resources with identified psychosocial needs;Provide feedback about the scores to participant    Expected Outcomes Short Term goal: Utilizing psychosocial counselor, staff and physician to assist with identification of specific Stressors or current issues interfering with healing process. Setting desired goal for each stressor or current issue identified.;Long Term Goal: Stressors or current issues are controlled or eliminated.;Short Term goal: Identification and review with participant of  any Quality of Life or Depression concerns found by scoring the questionnaire.;Long Term goal: The participant improves quality of Life and PHQ9 Scores as seen by post scores and/or verbalization of changes             Quality of Life Scores:   Quality of Life - 06/19/21 1108       Quality of Life   Select Quality of Life      Quality of Life Scores   Health/Function Pre 20.57 %    Socioeconomic Pre 23.57 %    Psych/Spiritual Pre 24.43 %    Family Pre 22.9 %    GLOBAL Pre 22.32 %            Scores of 19 and below usually indicate a poorer quality of life in these areas.  A difference of  2-3 points is a clinically meaningful difference.  A difference of 2-3 points in the total score of the Quality of Life Index has been associated with significant improvement in overall quality of life, self-image, physical symptoms, and general health in studies assessing change in quality of life.  PHQ-9: Recent Review Flowsheet Data     Depression screen [facility] 2/9 06/19/2021  02/25/2020 02/17/2020 05/04/2019 02/16/2019   Decreased Interest 0 0 0 0 0   Down, Depressed, Hopeless 0 0 0 0 0   PHQ - 2 Score 0 0 0 0 0   Altered sleeping 0 0 - 0 0   Tired, decreased energy 0 0 - 0 0   Change in appetite 0 0 - 0 0   Feeling bad or failure about yourself  0 0 - 0 0   Trouble concentrating 0 0 - 0 0   Moving slowly or fidgety/restless 0 0 - 0 0   Suicidal thoughts 0 0 - 0 0   PHQ-9 Score 0 0 - 0 0   Difficult doing work/chores Not difficult at all Not difficult at all - Not difficult at all Not difficult at all      Interpretation of Total Score  Total Score Depression Severity:  1-4 = Minimal depression, 5-9 = Mild depression, 10-14 = Moderate depression, 15-19 = Moderately severe depression, 20-27 = Severe depression   Psychosocial Evaluation and Intervention:  Psychosocial Evaluation - 05/25/21 1009       Psychosocial Evaluation & Interventions   Interventions Encouraged to exercise with the program and follow exercise prescription;Relaxation education;Stress management education    Comments He can look to his whole family for support. He has a good outlook on his health. He has not done much exercise before his event and is ready to get healthier. He works in [employer], technical sales.    Expected Outcomes Short: Start [facility] to help with mood. Long: Maintain a healthy mental state    Continue Psychosocial Services  Follow up required by staff             Psychosocial Re-Evaluation:  Psychosocial Re-Evaluation     [place] Name 05/25/21 1010             Psychosocial Re-Evaluation   Current issues with --       Comments --       Expected Outcomes --       Interventions --       Continue Psychosocial Services  --                Psychosocial Discharge (Final Psychosocial Re-Evaluation):  Psychosocial Re-Evaluation - 05/25/21 1010  Psychosocial Re-Evaluation   Current issues with --    Comments --    Expected Outcomes --    Interventions --    Continue  Psychosocial Services  --             Vocational Rehabilitation: Provide vocational rehab assistance to qualifying candidates.   Vocational Rehab Evaluation & Intervention:   Education: Education Goals: Education classes will be provided on a variety of topics geared toward better understanding of heart health and risk factor modification. Participant will state understanding/return demonstration of topics presented as noted by education test scores.  Learning Barriers/Preferences:  Learning Barriers/Preferences - 05/25/21 1007       Learning Barriers/Preferences   Learning Barriers None    Learning Preferences None             General Cardiac Education Topics:  AED/CPR: - Group verbal and written instruction with the use of models to demonstrate the basic use of the AED with the basic ABC's of resuscitation.   Anatomy and Cardiac Procedures: - Group verbal and visual presentation and models provide information about basic cardiac anatomy and function. Reviews the testing methods done to diagnose heart disease and the outcomes of the test results. Describes the treatment choices: Medical Management, Angioplasty, or Coronary Bypass Surgery for treating various heart conditions including Myocardial Infarction, Angina, Valve Disease, and Cardiac Arrhythmias.  Written material given at graduation.   Medication Safety: - Group verbal and visual instruction to review commonly prescribed medications for heart and lung disease. Reviews the medication, class of the drug, and side effects. Includes the steps to properly store meds and maintain the prescription regimen.  Written material given at graduation.   Intimacy: - Group verbal instruction through game format to discuss how heart and lung disease can affect sexual intimacy. Written material given at graduation..   Know Your Numbers and Heart Failure: - Group verbal and visual instruction to discuss disease risk factors  for cardiac and pulmonary disease and treatment options.  Reviews associated critical values for Overweight/Obesity, Hypertension, Cholesterol, and Diabetes.  Discusses basics of heart failure: signs/symptoms and treatments.  Introduces Heart Failure Zone chart for action plan for heart failure.  Written material given at graduation.   Infection Prevention: - Provides verbal and written material to individual with discussion of infection control including proper hand washing and proper equipment cleaning during exercise session. Flowsheet Row Cardiac Rehab from 06/19/2021 in [facility] Cardiac and Pulmonary Rehab  Date 06/19/21  Educator [facility]  Instruction Review Code 1- Verbalizes Understanding       Falls Prevention: - Provides verbal and written material to individual with discussion of falls prevention and safety. Flowsheet Row Cardiac Rehab from 06/19/2021 in [facility] Cardiac and Pulmonary Rehab  Date 06/19/21  Educator [facility]  Instruction Review Code 1- Verbalizes Understanding       Other: -Provides group and verbal instruction on various topics (see comments)   Knowledge Questionnaire Score:  Knowledge Questionnaire Score - 06/19/21 1108       Knowledge Questionnaire Score   Pre Score 25/26             Core Components/Risk Factors/Patient Goals at Admission:  Personal Goals and Risk Factors at Admission - 06/19/21 1105       Core Components/Risk Factors/Patient Goals on Admission    Weight Management Yes;Weight Loss    Intervention Weight Management: Develop a combined nutrition and exercise program designed to reach desired caloric intake, while maintaining appropriate intake of nutrient and fiber,  sodium and fats, and appropriate energy expenditure required for the weight goal.;Weight Management: Provide education and appropriate resources to help participant work on and attain dietary goals.;Weight Management/Obesity: Establish reasonable short term and long term weight goals.     Admit Weight 191 lb 8 oz (86.9 kg)    Goal Weight: Short Term 185 lb (83.9 kg)    Goal Weight: Long Term 185 lb (83.9 kg)    Expected Outcomes Long Term: Adherence to nutrition and physical activity/exercise program aimed toward attainment of established weight goal;Short Term: Continue to assess and modify interventions until short term weight is achieved;Understanding recommendations for meals to include 15-35% energy as protein, 25-35% energy from fat, 35-60% energy from carbohydrates, less than 200mg  of dietary cholesterol, 20-35 gm of total fiber daily;Understanding of distribution of calorie intake throughout the day with the consumption of 4-5 meals/snacks;Weight Loss: Understanding of general recommendations for a balanced deficit meal plan, which promotes 1-2 lb weight loss per week and includes a negative energy balance of [phone] kcal/d    Heart Failure Yes    Intervention Provide a combined exercise and nutrition program that is supplemented with education, support and counseling about heart failure. Directed toward relieving symptoms such as shortness of breath, decreased exercise tolerance, and extremity edema.    Expected Outcomes Improve functional capacity of life;Short term: Attendance in program 2-3 days a week with increased exercise capacity. Reported lower sodium intake. Reported increased fruit and vegetable intake. Reports medication compliance.;Short term: Daily weights obtained and reported for increase. Utilizing diuretic protocols set by physician.;Long term: Adoption of self-care skills and reduction of barriers for early signs and symptoms recognition and intervention leading to self-care maintenance.    Hypertension Yes    Intervention Provide education on lifestyle modifcations including regular physical activity/exercise, weight management, moderate sodium restriction and increased consumption of fresh fruit, vegetables, and low fat dairy, alcohol moderation, and smoking  cessation.;Monitor prescription use compliance.    Expected Outcomes Short Term: Continued assessment and intervention until BP is < 140/60mm HG in hypertensive participants. < 130/1mm HG in hypertensive participants with diabetes, heart failure or chronic kidney disease.;Long Term: Maintenance of blood pressure at goal levels.             Education:Diabetes - Individual verbal and written instruction to review signs/symptoms of diabetes, desired ranges of glucose level fasting, after meals and with exercise. Acknowledge that pre and post exercise glucose checks will be done for 3 sessions at entry of program.   Core Components/Risk Factors/Patient Goals Review:    Core Components/Risk Factors/Patient Goals at Discharge (Final Review):    ITP Comments:  ITP Comments     Row Name 05/25/21 1014 06/19/21 1056         ITP Comments Virtual Visit completed. Patient informed on EP and RD appointment and 6 Minute walk test. Patient also informed of patient health questionnaires on My Chart. Patient Verbalizes understanding. Visit diagnosis can be found in [facility] 04/27/2021. Completed 6MWT and gym orientation. Initial ITP created and sent for review to Dr. Emily Filbert, Medical Director.               Comments: initial ITP

## 2021-06-19 NOTE — Telephone Encounter (Signed)
The patient has been notified of the result and verbalized understanding.  All questions (if any) were answered. Antonieta Iba, RN 06/19/2021 10:44 AM  Patient will repeat labs in 4 weeks.

## 2021-06-19 NOTE — Telephone Encounter (Signed)
-----   Message from Sherren Mocha, MD sent at 06/19/2021 10:11 AM EST ----- Mild hyponatremia noted, otherwise labs within expected limits.  Please repeat a BMP in 4 weeks.

## 2021-06-19 NOTE — Telephone Encounter (Signed)
Prescription refill request received for warfarin Lov: 06/06/21 [name])  Next INR check: 06/20/21 Warfarin tablet strength: 5mg   Appropriate dose and refill sent to requested pharmacy.

## 2021-06-20 ENCOUNTER — Ambulatory Visit: Payer: 59 | Admitting: Pharmacist

## 2021-06-20 ENCOUNTER — Ambulatory Visit (INDEPENDENT_AMBULATORY_CARE_PROVIDER_SITE_OTHER): Payer: 59 | Admitting: Pharmacist

## 2021-06-20 ENCOUNTER — Other Ambulatory Visit: Payer: Self-pay

## 2021-06-20 VITALS — BP 106/80 | HR 85 | Wt 196.5 lb

## 2021-06-20 DIAGNOSIS — Z952 Presence of prosthetic heart valve: Secondary | ICD-10-CM

## 2021-06-20 DIAGNOSIS — I429 Cardiomyopathy, unspecified: Secondary | ICD-10-CM

## 2021-06-20 DIAGNOSIS — I350 Nonrheumatic aortic (valve) stenosis: Secondary | ICD-10-CM | POA: Diagnosis not present

## 2021-06-20 DIAGNOSIS — Z7901 Long term (current) use of anticoagulants: Secondary | ICD-10-CM | POA: Diagnosis not present

## 2021-06-20 DIAGNOSIS — R198 Other specified symptoms and signs involving the digestive system and abdomen: Secondary | ICD-10-CM

## 2021-06-20 LAB — POCT INR: INR: 2.5 (ref 2.0–3.0)

## 2021-06-20 NOTE — Progress Notes (Signed)
Patient ID: FIN HUPP                 DOB: [birth year]                      MRN: [id number]     HPI: [name] is a 46 y.o. male referred by Dr. Burt Knack to HTN clinic. PMH is significant for HTN, ICD/AICD in place (2017), hx of MI, obesity, ventricular tachycardia, CAD, HFrEF. Pt underwent surgical VSD repair at 46 years old. He was found to have an aortic valve problem at age 6, so underwent bioprosthetic aortic valve replacement. During that surgery there was an injury to the RCA and he had an infarct at that time. He was found to have episodes of VT in 2008 and was treated with medical therapy and an ICD. Previous ECHO on 01/20/21 showed LVEF of 35-40% and bioprosphetic aortic valve degenerative stenosis. Last ECHO on 06/07/21 showed a similar depressed LVEF of 30-35%. Cardiac cath in 02/2021 showed single-vessel CAD with Ost RCA to Prox RCA lesion 100% stenosis, severe aortic valve stenosis. On 04/27/21, pt underwent aortic valve replacement using a 23 mm On-X mechanical valve and started on warfarin. At his last visit with Dr. Burt Knack on 06/06/21, his blood pressure had been elevated.  He had started back on Entresto because of continued blood pressure elevation after increasing carvedilol. Added spironolactone 25 mg daily at this time. Referred for pharmacy follow up in 2 weeks after Entresto and spironolactone re-start on 06/06/21.   Pt presents today endorsing no new concerns on his medications. He has been taking carvedilol 12.5mg  BID and Entresto 49/51mg  BID in the morning/night. He states he had been taking Entresto for about two years now, but stopped around his recent valve replacement surgery for a few days. He has only been back on Entresto a few days when he saw Dr. Burt Knack. He has started taking his spironolactone in the afternoon, as he started to feel off when he took all 3 blood pressure medications in the morning. His home blood pressures have been averaging in the 90-110s/ 70-80s, HR  80s. Denies palpitations, chest pain, dizziness, blurry vision, or headaches. Previously had frequent urination when blood pressure got high. Endorses some substantial lethargy/lightheadedness after the surgery and initially when spironolactone was taken in the mornings. He takes his blood pressure at random times of the day. He endorses taking his BP meds this morning. He is working to lose weight (home weight is around 187 lbs) and would like to get back to a vegan diet.   INR was also checked today which was good at 2.5.  Current HTN meds: carvedilol 12.5mg  BID, Entresto 49/51mg  BID, spironolactone 25 mg daily Previously tried:  BP goal: <130/16mmHg  Family History: Cancer in his maternal grandfather and maternal grandmother; Heart disease in his father; Hypertension in his maternal grandfather and maternal grandmother.  Social History: never smoker, works at [employer] in [place]: prior to surgery was vegan - hoping to shift back to that diet, on warfarin so watching his diet carefully  Home BP readings: HR 80-90s  06/08/21: 155/100 06/10/21: 108/85 06/10/21: 114/77 06/13/21: 118/87 06/14/21: 90/76 06/14/21: 91/70 06/19/21: 110/89 06/10/21: 104/71  Labs: (06/19/21): Na 133, K 4.0, Scr 1.17 (mild hyponatremia)  Wt Readings from Last 3 Encounters:  06/19/21 191 lb 8 oz (86.9 kg)  06/06/21 198 lb (89.8 kg)  05/31/21 206 lb (93.4 kg)   BP Readings from Last  3 Encounters:  06/06/21 (!) 140/100  05/31/21 (!) 157/99  05/03/21 123/89   Pulse Readings from Last 3 Encounters:  06/06/21 88  05/31/21 90  05/03/21 85    Renal function: Estimated Creatinine Clearance: 82.5 mL/min (by C-G formula based on SCr of 1.17 mg/dL).  Past Medical History:  Diagnosis Date   AICD (automatic cardioverter/defibrillator) present    Anxiety    Aortic valve replaced    Requiring replacement, specifics not available   Complication of anesthesia    N & V   Dysrhythmia    Hearing loss of both ears  12/22/2015   Heart murmur    Hypertension    IBD (inflammatory bowel disease) 09/23/2015   ICD (implantable cardiac defibrillator), BSX single    Myocardial infarction [facility])    Obesity 12/22/2015   PONV (postoperative nausea and vomiting)    Secondary cardiomyopathy ([place])    Syncope    Ventricular septal defect    Ventricular tachycardia    appropriate VT shock therapy /13    Current Outpatient Medications on File Prior to Visit  Medication Sig Dispense Refill   acetaminophen (TYLENOL) 500 MG tablet Take 500 mg by mouth every 6 (six) hours as needed for moderate pain.     aspirin EC 81 MG EC tablet Take 1 tablet (81 mg total) by mouth daily. Swallow whole. 30 tablet 11   carvedilol (COREG) 12.5 MG tablet Take 12.5 mg by mouth 2 (two) times daily with a meal.     carvedilol (COREG) 6.25 MG tablet Take 1 tablet (6.25 mg total) by mouth 2 (two) times daily. (Patient not taking: Reported on 06/06/2021) 60 tablet 1   fluticasone (FLONASE) 50 MCG/ACT nasal spray Place 2 sprays into both nostrils daily as needed for allergies.     fluticasone (FLONASE) 50 MCG/ACT nasal spray Place into the nose. (Patient not taking: Reported on 06/06/2021)     loratadine (CLARITIN) 10 MG tablet Take by mouth as needed.     magnesium oxide (MAG-OX) 400 (241.3 Mg) MG tablet Take 1 tablet (400 mg total) by mouth 2 (two) times daily. 60 tablet 0   meclizine (ANTIVERT) 25 MG tablet Take 0.5-1 tablets (12.5-25 mg total) by mouth 3 (three) times daily as needed for dizziness. 30 tablet 2   Multiple Vitamin (MULTIVITAMIN) tablet Take 2 tablets by mouth daily.     ondansetron (ZOFRAN-ODT) 4 MG disintegrating tablet Take by mouth as needed.     oxyCODONE (OXY IR/ROXICODONE) 5 MG immediate release tablet Take 1 tablet (5 mg total) by mouth every 4 (four) hours as needed for severe pain. 30 tablet 0   potassium chloride SA (KLOR-CON M) 20 MEQ tablet Take 1 tablet (20 mEq total) by mouth daily. For 4 days then stop. 4 tablet  0   ranolazine (RANEXA) 1000 MG SR tablet Take 1 tablet (1,000 mg total) by mouth 2 (two) times daily. 180 tablet 3   sacubitril-valsartan (ENTRESTO) 49-51 MG Take 1 tablet by mouth 2 (two) times daily.     spironolactone (ALDACTONE) 25 MG tablet Take 1 tablet (25 mg total) by mouth daily. 90 tablet 3   torsemide (DEMADEX) 20 MG tablet Take 1 tablet (20 mg total) by mouth daily. For 4 days then stop. 4 tablet 0   triamcinolone cream (KENALOG) 0.1 % Apply 1 application topically 2 (two) times daily as needed. 30 g 0   triamcinolone cream (KENALOG) 0.1 % Apply topically as needed. (Patient not taking: Reported on 06/06/2021)  umeclidinium bromide (INCRUSE ELLIPTA) 62.5 MCG/ACT AEPB Inhale 1 puff into the lungs as needed.     warfarin (COUMADIN) 5 MG tablet TAKE 1 AND 1/2 TABLETS BY MOUTH DAILY OR AS DIRECTED BY THE COUMADIN CLINIC 50 tablet 3   No current facility-administered medications on file prior to visit.    Allergies  Allergen Reactions   Shellfish Allergy Nausea And Vomiting     Assessment/Plan:  1. Hypertension -  BP is at goal today of <130/5mmHg. Pt's home and clinic blood pressures have dropped substantially due to recent re-start of Entresto 49/51 mg BID and spironolactone 25mg  daily. BMET was stable, with slight hyponatremia likely due to spironolactone start. Due to soft BPs and slight hyponatremia, will decrease spironolactone to 12.5 mg daily. Will continue carvedilol 12.5 mg BID and Entresto 49/51mg  BID. Advised patient to ask to speak with the PharmD at his next INR check on Feb 24th to assess his blood pressure. Will additionally check BMET at this time in 3 weeks. Depending on how his BP responds to decrease is spironolactone and his HR, could consider increasing carvedilol. Encouraged patient he can pursue a vegan diet once again, but will just need to continue to be consistent with his Vit K intake or let us know if his Vit K intake has changed.   Jethro Poling,  PharmD Student assisted in this visit  Ramond Dial, Pharm.D, BCPS, CPP [place]  [address] [place], [place] [address]  Phone: [phone]; Fax: [phone]

## 2021-06-20 NOTE — Patient Instructions (Addendum)
It was good to see you today!  Your blood pressure today is 106/80 mmHg. You blood pressure goal is <130/80 mmHg.  Start taking a half tablet (or 12.5 mg) of spironolactone daily.   Continue taking your other blood pressure medications as usual.  We will check your INR and blood pressure at your next check on February 24th, 2023 at 9:00 AM.

## 2021-06-20 NOTE — Patient Instructions (Signed)
Continue taking warfarin 1.5 tablets daily except 2 tablets every Wednesday. Recheck INR in 3 weeks. Be consistent with green (2 times per week)  Coumadin Clinic [phone]

## 2021-06-21 ENCOUNTER — Encounter: Payer: 59 | Attending: Internal Medicine

## 2021-06-21 DIAGNOSIS — Z952 Presence of prosthetic heart valve: Secondary | ICD-10-CM | POA: Diagnosis not present

## 2021-06-21 NOTE — Progress Notes (Signed)
Daily Session Note  Patient Details  Name: [name] MRN: [id number] Date of Birth: 1975/08/17 Referring Provider:   Flowsheet Row Cardiac Rehab from 06/19/2021 in [facility] Cardiac and Pulmonary Rehab  Referring Provider Dr. Virl Axe       Encounter Date: 06/21/2021  Check In:  Session Check In - 06/21/21 1108       Check-In   Supervising physician immediately available to respond to emergencies See telemetry face sheet for immediately available ER MD    Location [facility]-Cardiac & Pulmonary Rehab    Staff Present Birdie Sons, MPA, RN;Melissa Thatcher, RDN, Rowe Pavy, BA, ACSM CEP, Exercise Physiologist;Joseph [place], [place]    Virtual Visit No    Medication changes reported     No    Fall or balance concerns reported    No    Warm-up and Cool-down Performed on first and last piece of equipment    Resistance Training Performed Yes    VAD Patient? No    PAD/SET Patient? No      Pain Assessment   Currently in Pain? No/denies                Social History   Tobacco Use  Smoking Status Never  Smokeless Tobacco Never    Goals Met:  Independence with exercise equipment Exercise tolerated well No report of concerns or symptoms today Strength training completed today  Goals Unmet:  Not Applicable  Comments: First full day of exercise!  Patient was oriented to gym and equipment including functions, settings, policies, and procedures.  Patient's individual exercise prescription and treatment plan were reviewed.  All starting workloads were established based on the results of the 6 minute walk test done at initial orientation visit.  The plan for exercise progression was also introduced and progression will be customized based on patient's performance and goals.    Dr. Emily Filbert is Medical Director for [place].  Dr. Ottie Glazier is Medical Director for [facility] Pulmonary Rehabilitation.

## 2021-06-23 ENCOUNTER — Encounter: Payer: 59 | Admitting: *Deleted

## 2021-06-23 ENCOUNTER — Other Ambulatory Visit: Payer: Self-pay

## 2021-06-23 DIAGNOSIS — Z952 Presence of prosthetic heart valve: Secondary | ICD-10-CM | POA: Diagnosis not present

## 2021-06-23 NOTE — Progress Notes (Signed)
Daily Session Note  Patient Details  Name: [name] MRN: [id number] Date of Birth: August 27, 1975 Referring Provider:   Flowsheet Row Cardiac Rehab from 06/19/2021 in [facility] Cardiac and Pulmonary Rehab  Referring Provider Dr. Virl Axe       Encounter Date: 06/23/2021  Check In:  Session Check In - 06/23/21 1117       Check-In   Supervising physician immediately available to respond to emergencies See telemetry face sheet for immediately available ER MD    Location [facility]-Cardiac & Pulmonary Rehab    Staff Present Renita Papa, RN BSN;Joseph [place], RCP,RRT,BSRT;Jessica Alpha, [place], RCEP, CCRP, CCET    Virtual Visit No    Medication changes reported     No    Fall or balance concerns reported    No    Warm-up and Cool-down Performed on first and last piece of equipment    Resistance Training Performed Yes    VAD Patient? No    PAD/SET Patient? No      Pain Assessment   Currently in Pain? No/denies                Social History   Tobacco Use  Smoking Status Never  Smokeless Tobacco Never    Goals Met:  Independence with exercise equipment Exercise tolerated well No report of concerns or symptoms today Strength training completed today  Goals Unmet:  Not Applicable  Comments: Pt able to follow exercise prescription today without complaint.  Will continue to monitor for progression.    Dr. Emily Filbert is Medical Director for [place].  Dr. Ottie Glazier is Medical Director for [facility] Pulmonary Rehabilitation.

## 2021-06-26 ENCOUNTER — Encounter: Payer: Self-pay | Admitting: Cardiovascular Disease

## 2021-06-26 ENCOUNTER — Encounter: Payer: 59 | Admitting: *Deleted

## 2021-06-26 ENCOUNTER — Other Ambulatory Visit: Payer: Self-pay

## 2021-06-26 DIAGNOSIS — I5022 Chronic systolic (congestive) heart failure: Secondary | ICD-10-CM

## 2021-06-26 DIAGNOSIS — I10 Essential (primary) hypertension: Secondary | ICD-10-CM

## 2021-06-26 DIAGNOSIS — Z952 Presence of prosthetic heart valve: Secondary | ICD-10-CM

## 2021-06-26 NOTE — Progress Notes (Signed)
Daily Session Note  Patient Details  Name: [name] MRN: [id number] Date of Birth: 1976-01-27 Referring Provider:   Flowsheet Row Cardiac Rehab from 06/19/2021 in [facility] Cardiac and Pulmonary Rehab  Referring Provider Dr. Virl Axe       Encounter Date: 06/26/2021  Check In:  Session Check In - 06/26/21 1107       Check-In   Supervising physician immediately available to respond to emergencies See telemetry face sheet for immediately available ER MD    Location [facility]-Cardiac & Pulmonary Rehab    Staff Present Earlean Shawl, BS, ACSM CEP, Exercise Physiologist;Caitlin Hillmer Sherryll Burger, RN Vickki Hearing, BA, ACSM CEP, Exercise Physiologist;Kelly Rosalia Hammers, MPA, RN    Virtual Visit No    Medication changes reported     No    Fall or balance concerns reported    No    Warm-up and Cool-down Performed on first and last piece of equipment    Resistance Training Performed Yes    VAD Patient? No    PAD/SET Patient? No      Pain Assessment   Currently in Pain? No/denies                Social History   Tobacco Use  Smoking Status Never  Smokeless Tobacco Never    Goals Met:  Independence with exercise equipment Exercise tolerated well No report of concerns or symptoms today Strength training completed today  Goals Unmet:  Not Applicable  Comments: Pt able to follow exercise prescription today without complaint.  Will continue to monitor for progression.    Dr. Emily Filbert is Medical Director for [place].  Dr. Ottie Glazier is Medical Director for [facility] Pulmonary Rehabilitation.

## 2021-06-27 ENCOUNTER — Ambulatory Visit: Payer: 59 | Admitting: Unknown Physician Specialty

## 2021-06-27 NOTE — Telephone Encounter (Signed)
Called and spoke to patient regarding his new onset of weakness. Pt states that the episodes began approx 4 days ago, initially lasting 5-10 minutes, but progressively lasting longer with most recent episode yesterday lasting 1 hour. Pt describes the feeling as a sudden "feeling drained" or depleted of energy. Pt states that yesterday am his pressure was 91/59 (felt poor), went to cardiac rehab where BP they measured was 100/63, but they informed him he could do therapy since he was stable. Upon returning home, pt felt bad again, checked his pressure and got 97/73. Pt states he was just sitting in a chair and that activity does not seem to provoke episodes. By the evening he felt better, re-checked his BP and it was 114/78. Repeat check at bedtime was 105/74 and pt states he still felt fine at this time. Pt condones he increased his coreg to twice daily and started again on Entresto after seeing Dr Cyndia Bent last month. Per Dr Vivi Martens note:   "His blood pressure is rising since discharge and I asked him to increase his Coreg to 12.5 mg twice daily which was his prior dose before surgery. If his pressure stays up after that I asked him to resume his Entresto at his previous dose." I asked patient based on this note, to continue the Coreg, but to skip his Entresto dose today and call back to office tomorrow to report readings and how he felt. It appears his pressure is dropping too low and causing these symptoms. Pt agrees to plan.

## 2021-06-28 ENCOUNTER — Other Ambulatory Visit: Payer: Self-pay

## 2021-06-28 ENCOUNTER — Other Ambulatory Visit: Payer: Self-pay | Admitting: Physician Assistant

## 2021-06-28 ENCOUNTER — Other Ambulatory Visit: Payer: Self-pay | Admitting: Internal Medicine

## 2021-06-28 DIAGNOSIS — Z952 Presence of prosthetic heart valve: Secondary | ICD-10-CM

## 2021-06-28 MED ORDER — MAGNESIUM OXIDE 400 MG PO TABS
400.0000 mg | ORAL_TABLET | Freq: Two times a day (BID) | ORAL | 3 refills | Status: DC
Start: 2021-06-28 — End: 2022-06-05

## 2021-06-28 MED ORDER — ENTRESTO 49-51 MG PO TABS: 1.0000 | ORAL_TABLET | Freq: Two times a day (BID) | ORAL | 3 refills | Status: DC

## 2021-06-28 NOTE — Progress Notes (Signed)
Daily Session Note  Patient Details  Name: [name] MRN: [id number] Date of Birth: 1975-10-22 Referring Provider:   Flowsheet Row Cardiac Rehab from 06/19/2021 in [facility] Cardiac and Pulmonary Rehab  Referring Provider Dr. Virl Axe       Encounter Date: 06/28/2021  Check In:  Session Check In - 06/28/21 1107       Check-In   Supervising physician immediately available to respond to emergencies See telemetry face sheet for immediately available ER MD    Location [facility]-Cardiac & Pulmonary Rehab    Staff Present Birdie Sons, MPA, RN;Melissa [place], RDN, LDN;Meredith Sherryll Burger, RN Vickki Hearing, BA, ACSM CEP, Exercise Physiologist    Virtual Visit No    Medication changes reported     No    Fall or balance concerns reported    No    Warm-up and Cool-down Performed on first and last piece of equipment    Resistance Training Performed Yes    VAD Patient? No    PAD/SET Patient? No      Pain Assessment   Currently in Pain? No/denies                Social History   Tobacco Use  Smoking Status Never  Smokeless Tobacco Never    Goals Met:  Independence with exercise equipment Exercise tolerated well No report of concerns or symptoms today Strength training completed today  Goals Unmet:  Not Applicable  Comments: Pt able to follow exercise prescription today without complaint.  Will continue to monitor for progression.    Dr. Emily Filbert is Medical Director for [place].  Dr. Ottie Glazier is Medical Director for [facility] Pulmonary Rehabilitation.

## 2021-06-28 NOTE — Telephone Encounter (Signed)
You  [name] Just now (3:42 PM)   Thank you for following through with this Mr. Rudy. I'm glad to hear that you feel better and pressures seem to be more stable without the added Entresto. I will forward this information to Dr. Burt Knack for additional advisement and be back in touch with you. Judson Roch, RN  This MyChart message has not been read.   [place] Triage (supporting Sherren Mocha, MD) 2 hours ago (1:08 PM)    I did not take my morning Entresto on 2/7 as advised. No symptoms of weakness or Nausea.  I did take my afternoon dose per schedule but at a later time. I did experience some weakness following it but it was mild.   On 2/8 I did not take my morning dose and made sure to drink plenty of water before working out. Blood pressure at 12:54 was 118/80. No weakness  or Nausea so far.

## 2021-06-30 ENCOUNTER — Encounter: Payer: 59 | Admitting: *Deleted

## 2021-06-30 ENCOUNTER — Other Ambulatory Visit: Payer: Self-pay

## 2021-06-30 DIAGNOSIS — Z952 Presence of prosthetic heart valve: Secondary | ICD-10-CM

## 2021-06-30 NOTE — Progress Notes (Signed)
Daily Session Note  Patient Details  Name: AURON TADROS MRN: [id number] Date of Birth: 1976/04/30 Referring Provider:   Flowsheet Row Cardiac Rehab from 06/19/2021 in [facility] Cardiac and Pulmonary Rehab  Referring Provider Dr. Virl Axe       Encounter Date: 06/30/2021  Check In:  Session Check In - 06/30/21 1108       Check-In   Supervising physician immediately available to respond to emergencies See telemetry face sheet for immediately available ER MD    Location [facility]-Cardiac & Pulmonary Rehab    Staff Present Renita Papa, RN BSN;Joseph [place], RCP,RRT,BSRT;Jessica [place], [place], RCEP, CCRP, CCET    Virtual Visit No    Medication changes reported     No    Fall or balance concerns reported    No    Warm-up and Cool-down Performed on first and last piece of equipment    Resistance Training Performed Yes    VAD Patient? No    PAD/SET Patient? No      Pain Assessment   Currently in Pain? No/denies                Social History   Tobacco Use  Smoking Status Never  Smokeless Tobacco Never    Goals Met:  Independence with exercise equipment Exercise tolerated well No report of concerns or symptoms today Strength training completed today  Goals Unmet:  Not Applicable  Comments: Pt able to follow exercise prescription today without complaint.  Will continue to monitor for progression.  Reviewed home exercise with pt today.  Pt plans to walk and use elliptical at home for exercise. We also talked about using staff videos.  Reviewed THR, pulse, RPE, sign and symptoms, pulse oximetery and when to call 911 or MD.  Also discussed weather considerations and indoor options.  Pt voiced understanding.   Dr. Emily Filbert is Medical Director for [place].  Dr. Ottie Glazier is Medical Director for [facility] Pulmonary Rehabilitation.

## 2021-07-03 ENCOUNTER — Other Ambulatory Visit: Payer: Self-pay

## 2021-07-03 ENCOUNTER — Encounter: Payer: 59 | Admitting: *Deleted

## 2021-07-03 DIAGNOSIS — Z952 Presence of prosthetic heart valve: Secondary | ICD-10-CM

## 2021-07-03 MED ORDER — ENTRESTO 24-26 MG PO TABS: 1.0000 | ORAL_TABLET | Freq: Two times a day (BID) | ORAL | 12 refills | Status: DC

## 2021-07-03 MED ORDER — CARVEDILOL 6.25 MG PO TABS: 6.2500 mg | ORAL_TABLET | Freq: Two times a day (BID) | ORAL | 3 refills | Status: DC

## 2021-07-03 NOTE — Progress Notes (Signed)
Daily Session Note  Patient Details  Name: Fernando Fernando MRN: [id number] Date of Birth: 1976-02-02 Referring Provider:   Flowsheet Row Cardiac Rehab from 06/19/2021 in [facility] Cardiac and Pulmonary Rehab  Referring Provider Dr. Virl Axe       Encounter Date: 07/03/2021  Check In:  Session Check In - 07/03/21 1114       Check-In   Supervising physician immediately available to respond to emergencies See telemetry face sheet for immediately available ER MD    Location [facility]-Cardiac & Pulmonary Rehab    Staff Present Renita Papa, RN Moises Blood, BS, ACSM CEP, Exercise Physiologist;Amanda Oletta Darter, BA, ACSM CEP, Exercise Physiologist;Kelly Rosalia Hammers, MPA, RN    Virtual Visit No    Medication changes reported     No    Fall or balance concerns reported    No    Warm-up and Cool-down Performed on first and last piece of equipment    Resistance Training Performed Yes    VAD Patient? No    PAD/SET Patient? No      Pain Assessment   Currently in Pain? No/denies                Social History   Tobacco Use  Smoking Status Never  Smokeless Tobacco Never    Goals Met:  Independence with exercise equipment Exercise tolerated well No report of concerns or symptoms today Strength training completed today  Goals Unmet:  Not Applicable  Comments: Pt able to follow exercise prescription today without complaint.  Will continue to monitor for progression.    Dr. Emily Filbert is Medical Director for [place].  Dr. Ottie Glazier is Medical Director for [facility] Pulmonary Rehabilitation.

## 2021-07-03 NOTE — Telephone Encounter (Signed)
Called and spoke to patient with Dr Antionette Char recommendations to decrease doses of both medications and see pharmD clinic. Pt agrees to plan. Will begin to decrease medications today and monitor BP. New prescriptions sent into pharmacy on file.

## 2021-07-03 NOTE — Addendum Note (Signed)
Addended by: Ma Hillock on: 07/03/2021 05:30 PM   Modules accepted: Orders

## 2021-07-04 ENCOUNTER — Other Ambulatory Visit: Payer: Self-pay

## 2021-07-04 MED ORDER — ENTRESTO 24-26 MG PO TABS: 1.0000 | ORAL_TABLET | Freq: Two times a day (BID) | ORAL | 3 refills | Status: DC

## 2021-07-05 ENCOUNTER — Ambulatory Visit (INDEPENDENT_AMBULATORY_CARE_PROVIDER_SITE_OTHER): Payer: Self-pay | Admitting: Surgery

## 2021-07-05 ENCOUNTER — Other Ambulatory Visit: Payer: Self-pay

## 2021-07-05 ENCOUNTER — Encounter: Payer: Self-pay | Admitting: Surgery

## 2021-07-05 VITALS — BP 109/72 | HR 90 | Resp 20 | Ht 70.0 in | Wt 198.0 lb

## 2021-07-05 DIAGNOSIS — Z952 Presence of prosthetic heart valve: Secondary | ICD-10-CM

## 2021-07-05 NOTE — Progress Notes (Signed)
Daily Session Note  Patient Details  Name: [name] MRN: [id number] Date of Birth: 03-23-1976 Referring Provider:   Flowsheet Row Cardiac Rehab from 06/19/2021 in [facility] Cardiac and Pulmonary Rehab  Referring Provider Dr. Virl Axe       Encounter Date: 07/05/2021  Check In:  Session Check In - 07/05/21 1114       Check-In   Supervising physician immediately available to respond to emergencies See telemetry face sheet for immediately available ER MD    Location [facility]-Cardiac & Pulmonary Rehab    Staff Present Birdie Sons, MPA, RN;Melissa [place], RDN, Rowe Pavy, BA, ACSM CEP, Exercise Physiologist;Joseph [place], [place]    Virtual Visit No    Medication changes reported     Yes    Comments entresto 24/26 carvedilol 6.25mg     Fall or balance concerns reported    No    Warm-up and Cool-down Performed on first and last piece of equipment    Resistance Training Performed Yes    VAD Patient? No    PAD/SET Patient? No      Pain Assessment   Currently in Pain? No/denies                Social History   Tobacco Use  Smoking Status Never  Smokeless Tobacco Never    Goals Met:  Independence with exercise equipment Exercise tolerated well No report of concerns or symptoms today Strength training completed today  Goals Unmet:  Not Applicable  Comments: Pt able to follow exercise prescription today without complaint.  Will continue to monitor for progression.    Dr. Emily Filbert is Medical Director for [place].  Dr. Ottie Glazier is Medical Director for [facility] Pulmonary Rehabilitation.

## 2021-07-05 NOTE — Progress Notes (Signed)
HPI:  Patient returns for routine postoperative follow-up having undergone third time redo median sternotomy for aortic valve replacement using a 23 mm On-X mechanical valve on 04/27/2021.  He is walking daily without chest pain or shortness of breath.  He does not feel like his stamina is back to normal yet.  His INR has been followed in the Coumadin clinic and the last one was 2.5 on 06/20/2021.    Current Outpatient Medications  Medication Sig Dispense Refill   acetaminophen (TYLENOL) 500 MG tablet Take 500 mg by mouth every 6 (six) hours as needed for moderate pain.     aspirin EC 81 MG EC tablet Take 1 tablet (81 mg total) by mouth daily. Swallow whole. 30 tablet 11   carvedilol (COREG) 6.25 MG tablet Take 1 tablet (6.25 mg total) by mouth 2 (two) times daily. 180 tablet 3   fluticasone (FLONASE) 50 MCG/ACT nasal spray Place 2 sprays into both nostrils daily as needed for allergies.     fluticasone (FLONASE) 50 MCG/ACT nasal spray Place into the nose.     loratadine (CLARITIN) 10 MG tablet Take by mouth as needed.     magnesium oxide (MAG-OX) 400 MG tablet Take 1 tablet (400 mg total) by mouth 2 (two) times daily. 180 tablet 3   meclizine (ANTIVERT) 25 MG tablet Take 0.5-1 tablets (12.5-25 mg total) by mouth 3 (three) times daily as needed for dizziness. 30 tablet 2   Multiple Vitamin (MULTIVITAMIN) tablet Take 2 tablets by mouth daily.     ondansetron (ZOFRAN-ODT) 4 MG disintegrating tablet Take by mouth as needed.     oxyCODONE (OXY IR/ROXICODONE) 5 MG immediate release tablet Take 1 tablet (5 mg total) by mouth every 4 (four) hours as needed for severe pain. 30 tablet 0   potassium chloride SA (KLOR-CON M) 20 MEQ tablet Take 1 tablet (20 mEq total) by mouth daily. For 4 days then stop. 4 tablet 0   ranolazine (RANEXA) 1000 MG SR tablet Take 1 tablet (1,000 mg total) by mouth 2 (two) times daily. 180 tablet 3   sacubitril-valsartan (ENTRESTO) 24-26 MG Take 1 tablet by mouth 2 (two)  times daily. 180 tablet 3   spironolactone (ALDACTONE) 25 MG tablet Take 0.5 tablets (12.5 mg total) by mouth daily. 45 tablet 3   torsemide (DEMADEX) 20 MG tablet Take 1 tablet (20 mg total) by mouth daily. For 4 days then stop. 4 tablet 0   triamcinolone cream (KENALOG) 0.1 % Apply 1 application topically 2 (two) times daily as needed. 30 g 0   triamcinolone cream (KENALOG) 0.1 % Apply topically as needed.     umeclidinium bromide (INCRUSE ELLIPTA) 62.5 MCG/ACT AEPB Inhale 1 puff into the lungs as needed.     warfarin (COUMADIN) 5 MG tablet TAKE 1 AND 1/2 TABLETS BY MOUTH DAILY OR AS DIRECTED BY THE COUMADIN CLINIC 50 tablet 3   No current facility-administered medications for this visit.    Physical Exam: BP 109/72 (BP Location: Left Arm, Patient Position: Sitting)    Pulse 90    Resp 20    Ht 5\' 10"  (1.778 m)    Wt 198 lb (89.8 kg)    SpO2 97% Comment: RA   BMI 28.41 kg/m  He looks well. Cardiac exam shows a regular rate and rhythm with crisp mechanical valve click.  There is no murmur. Lungs are clear. The chest incision is well-healed and the sternum is stable. There is no peripheral edema.  Diagnostic Tests:  ECHOCARDIOGRAM REPORT         Patient Name:   [name] Date of Exam: 06/07/2021  Medical Rec #:  [id number]      Height:       70.0 in  Accession #:    [id number]     Weight:       198.0 lb  Date of Birth:  01/17/1976      BSA:          2.078 m  Patient Age:    46 years       BP:           140/100 mmHg  Patient Gender: M              HR:           89 bpm.  Exam Location:  [place]   Procedure: 2D Echo, Cardiac Doppler and Color Doppler   Indications:    I35.9 AV disorder     History:        Patient has prior history of Echocardiogram examinations,  most                  recent 01/20/2021. Cardiomyopathy, Previous Myocardial  Infarction                  and CAD, ICD, S/p AVR (70mm ON-X mechanical),                  Arrythmias:Ventricular tachycardia,  Signs/Symptoms:H/o VSD                  repair; Risk Factors:Hypertension.     Sonographer:    Coralyn Helling RDCS  Referring Phys: [name]     1. Left ventricular ejection fraction, by estimation, is 30 to 35%. The  left ventricle has moderately decreased function. The left ventricle  demonstrates regional wall motion abnormalities with severe anteroseptal,  inferoseptal, and inferolateral  hypokinesis and basal to mid inferior akinesis. The left ventricular  internal cavity size was moderately dilated. There is mild left  ventricular hypertrophy. Left ventricular diastolic parameters are  consistent with Grade I diastolic dysfunction  (impaired relaxation).   2. Right ventricular systolic function is mildly reduced. The right  ventricular size is normal. Tricuspid regurgitation signal is inadequate  for assessing PA pressure.   3. The mitral valve is normal in structure. No evidence of mitral valve  regurgitation. No evidence of mitral stenosis.   4. Mechanical AVR (23 mm On-X valve). Mean gradient 8 mmHg, no  significant stenosis. Trivial aortic insufficiency.   5. Left atrial size was mildly dilated.   6. The inferior vena cava is normal in size with greater than 50%  respiratory variability, suggesting right atrial pressure of 3 mmHg.   FINDINGS   Left Ventricle: Left ventricular ejection fraction, by estimation, is 30  to 35%. The left ventricle has moderately decreased function. The left  ventricle demonstrates regional wall motion abnormalities. The left  ventricular internal cavity size was  moderately dilated. There is mild left ventricular hypertrophy. Left  ventricular diastolic parameters are consistent with Grade I diastolic  dysfunction (impaired relaxation).   Right Ventricle: The right ventricular size is normal. No increase in  right ventricular wall thickness. Right ventricular systolic function is  mildly reduced. Tricuspid  regurgitation signal is inadequate for assessing  PA pressure.   Left Atrium: Left atrial size was mildly dilated.   Right Atrium: Right atrial  size was normal in size.   Pericardium: There is no evidence of pericardial effusion.   Mitral Valve: The mitral valve is normal in structure. There is mild  calcification of the mitral valve leaflet(s). No evidence of mitral valve  regurgitation. No evidence of mitral valve stenosis.   Tricuspid Valve: The tricuspid valve is normal in structure. Tricuspid  valve regurgitation is not demonstrated.   Aortic Valve: Mechanical AVR (23 mm On-X valve). Mean gradient 8 mmHg, no  significant stenosis. Trivial aortic insufficiency. The aortic valve has  been repaired/replaced. Aortic valve regurgitation is trivial. No aortic  stenosis is present. Aortic valve  mean gradient measures 8.0 mmHg. Aortic valve peak gradient measures 13.7  mmHg. Aortic valve area, by VTI measures 1.68 cm.   Pulmonic Valve: The pulmonic valve was normal in structure. Pulmonic valve  regurgitation is not visualized.   Aorta: The aortic root is normal in size and structure.   Venous: The inferior vena cava is normal in size with greater than 50%  respiratory variability, suggesting right atrial pressure of 3 mmHg.   IAS/Shunts: No atrial level shunt detected by color flow Doppler.   Additional Comments: A device lead is visualized in the right ventricle.      LEFT VENTRICLE  PLAX 2D  LVIDd:         6.50 cm  LVIDs:         5.30 cm  LV PW:         1.30 cm  LV IVS:        1.40 cm  LVOT diam:     2.50 cm  LV SV:         53  LV SV Index:   25  LVOT Area:     4.91 cm      RIGHT VENTRICLE         IVC  TAPSE (M-mode): 1.1 cm  IVC diam: 0.90 cm   LEFT ATRIUM             Index        RIGHT ATRIUM           Index  LA diam:        4.30 cm 2.07 cm/m   RA Pressure: 3.00 mmHg  LA Vol (A2C):   85.9 ml 41.33 ml/m  RA Area:     14.30 cm  LA Vol (A4C):   43.7 ml 21.03  ml/m  RA Volume:   39.80 ml  19.15 ml/m  LA Biplane Vol: 60.7 ml 29.20 ml/m   AORTIC VALVE  AV Area (Vmax):    1.35 cm  AV Area (Vmean):   1.28 cm  AV Area (VTI):     1.68 cm  AV Vmax:           185.00 cm/s  AV Vmean:          125.000 cm/s  AV VTI:            0.314 m  AV Peak Grad:      13.7 mmHg  AV Mean Grad:      8.0 mmHg  LVOT Vmax:         50.90 cm/s  LVOT Vmean:        32.700 cm/s  LVOT VTI:          0.107 m  LVOT/AV VTI ratio: 0.34     AORTA  Ao Root diam: 3.40 cm  Ao Asc diam:  3.10 cm   MITRAL  VALVE               TRICUSPID VALVE  MV Area (PHT): 4.96 cm    Estimated RAP:  3.00 mmHg  MV Decel Time: 153 msec  MV E velocity: 78.60 cm/s  SHUNTS  MV A velocity: 73.40 cm/s  Systemic VTI:  0.11 m  MV E/A ratio:  1.07        Systemic Diam: 2.50 cm   Dalton McleanMD  Electronically signed by Franki Monte  Signature Date/Time: 06/07/2021/4:51:03 PM         Final     Impression:  He is a little over 2 months out from his surgery and I think he is doing well.  I encouraged him to continue walking as much as possible to build up stamina.  His INR is being followed in the Coumadin clinic and at 3 months the INR goal can be dropped to 1.5-2.0 as long as he is on aspirin 81 mg daily in addition to Coumadin.  His echo on 06/07/2021 showed ejection fraction of 30 to 35% which is unchanged from preoperatively.  His mechanical aortic valve is functioning normally with a mean gradient of 8 mmHg.  I reviewed the results with him.  He may have some improvement in his ejection fraction over time although I think he had a significant MI due to right coronary occlusion after his initial aortic valve replacement in [name].  Plan:  He will continue to follow-up with Dr. Burt Knack and will return to see me if he has any problems with his incision.   Gaye Pollack, MD [facility] [phone]

## 2021-07-05 NOTE — Progress Notes (Signed)
Completed initial RD consultation ?

## 2021-07-07 ENCOUNTER — Encounter: Payer: 59 | Admitting: *Deleted

## 2021-07-07 ENCOUNTER — Other Ambulatory Visit: Payer: Self-pay

## 2021-07-07 DIAGNOSIS — Z952 Presence of prosthetic heart valve: Secondary | ICD-10-CM | POA: Diagnosis not present

## 2021-07-07 NOTE — Progress Notes (Signed)
Daily Session Note  Patient Details  Name: [name] MRN: [id number] Date of Birth: April 09, 1976 Referring Provider:   Flowsheet Row Cardiac Rehab from 06/19/2021 in [facility] Cardiac and Pulmonary Rehab  Referring Provider Dr. Virl Axe       Encounter Date: 07/07/2021  Check In:  Session Check In - 07/07/21 1111       Check-In   Supervising physician immediately available to respond to emergencies See telemetry face sheet for immediately available ER MD    Location [facility]-Cardiac & Pulmonary Rehab    Staff Present Renita Papa, RN BSN;Joseph [place], RCP,RRT,BSRT;Jessica [place], [place], RCEP, CCRP, CCET    Virtual Visit No    Medication changes reported     No    Fall or balance concerns reported    No    Warm-up and Cool-down Performed on first and last piece of equipment    Resistance Training Performed Yes    VAD Patient? No    PAD/SET Patient? No      Pain Assessment   Currently in Pain? No/denies                Social History   Tobacco Use  Smoking Status Never  Smokeless Tobacco Never    Goals Met:  Independence with exercise equipment Exercise tolerated well No report of concerns or symptoms today Strength training completed today  Goals Unmet:  Not Applicable  Comments: Pt able to follow exercise prescription today without complaint.  Will continue to monitor for progression.    Dr. Emily Filbert is Medical Director for [place].  Dr. Ottie Glazier is Medical Director for [facility] Pulmonary Rehabilitation.

## 2021-07-10 ENCOUNTER — Other Ambulatory Visit: Payer: Self-pay

## 2021-07-10 ENCOUNTER — Encounter: Payer: 59 | Admitting: *Deleted

## 2021-07-10 DIAGNOSIS — Z952 Presence of prosthetic heart valve: Secondary | ICD-10-CM

## 2021-07-10 NOTE — Progress Notes (Signed)
Daily Session Note  Patient Details  Name: [name] MRN: [id number] Date of Birth: [birth year] Referring Provider:   Flowsheet Row Cardiac Rehab from 06/19/2021 in [facility] Cardiac and Pulmonary Rehab  Referring Provider Dr. Virl Axe       Encounter Date: 07/10/2021  Check In:  Session Check In - 07/10/21 1123       Check-In   Supervising physician immediately available to respond to emergencies See telemetry face sheet for immediately available MD    Location [facility]-Cardiac & Pulmonary Rehab    Staff Present Renita Papa, RN BSN;Joseph [place], RCP,RRT,BSRT;Kelly [employer], [place], ACSM CEP, Exercise Physiologist    Virtual Visit No    Medication changes reported     No    Fall or balance concerns reported    No    Warm-up and Cool-down Performed on first and last piece of equipment    Resistance Training Performed Yes    VAD Patient? No    PAD/SET Patient? No      Pain Assessment   Currently in Pain? No/denies                Social History   Tobacco Use  Smoking Status Never  Smokeless Tobacco Never    Goals Met:  Independence with exercise equipment Exercise tolerated well No report of concerns or symptoms today Strength training completed today  Goals Unmet:  Not Applicable  Comments: Pt able to follow exercise prescription today without complaint.  Will continue to monitor for progression.    Dr. Emily Filbert is Medical Director for [place].  Dr. Ottie Glazier is Medical Director for [facility] Pulmonary Rehabilitation.

## 2021-07-11 ENCOUNTER — Other Ambulatory Visit: Payer: Self-pay

## 2021-07-11 ENCOUNTER — Encounter: Payer: Self-pay | Admitting: Internal Medicine

## 2021-07-11 ENCOUNTER — Ambulatory Visit (INDEPENDENT_AMBULATORY_CARE_PROVIDER_SITE_OTHER): Payer: 59 | Admitting: Internal Medicine

## 2021-07-11 VITALS — BP 132/74 | HR 95 | Temp 97.2°F | Resp 16 | Ht 70.0 in | Wt 200.8 lb

## 2021-07-11 VITALS — BP 106/72 | HR 81 | Ht 70.0 in | Wt 199.0 lb

## 2021-07-11 DIAGNOSIS — Z0000 Encounter for general adult medical examination without abnormal findings: Secondary | ICD-10-CM

## 2021-07-11 DIAGNOSIS — I472 Ventricular tachycardia, unspecified: Secondary | ICD-10-CM

## 2021-07-11 DIAGNOSIS — I255 Ischemic cardiomyopathy: Secondary | ICD-10-CM

## 2021-07-11 DIAGNOSIS — I5022 Chronic systolic (congestive) heart failure: Secondary | ICD-10-CM

## 2021-07-11 DIAGNOSIS — I10 Essential (primary) hypertension: Secondary | ICD-10-CM

## 2021-07-11 DIAGNOSIS — I429 Cardiomyopathy, unspecified: Secondary | ICD-10-CM | POA: Diagnosis not present

## 2021-07-11 DIAGNOSIS — Z95810 Presence of automatic (implantable) cardiac defibrillator: Secondary | ICD-10-CM | POA: Diagnosis not present

## 2021-07-11 LAB — PACEMAKER DEVICE OBSERVATION

## 2021-07-11 NOTE — Patient Instructions (Signed)
Medication Instructions:  - Your physician recommends that you continue on your current medications as directed. Please refer to the Current Medication list given to you today.  *If you need a refill on your cardiac medications before your next appointment, please call your pharmacy*   Lab Work: - none ordered  If you have labs (blood work) drawn today and your tests are completely normal, you will receive your results only by: [place] (if you have MyChart) OR A paper copy in the mail If you have any lab test that is abnormal or we need to change your treatment, we will call you to review the results.   Testing/Procedures: - none ordered   Follow-Up: At [facility], you and your health needs are our priority.  As part of our continuing mission to provide you with exceptional heart care, we have created designated Provider Care Teams.  These Care Teams include your primary Cardiologist (physician) and Advanced Practice Providers (APPs -  Physician Assistants and Nurse Practitioners) who all work together to provide you with the care you need, when you need it.  We recommend signing up for the patient portal called "MyChart".  Sign up information is provided on this After Visit Summary.  MyChart is used to connect with patients for Virtual Visits (Telemedicine).  Patients are able to view lab/test results, encounter notes, upcoming appointments, etc.  Non-urgent messages can be sent to your provider as well.   To learn more about what you can do with MyChart, go to [email or web address].    Your next appointment:   1 year(s)  The format for your next appointment:   In Person  Provider:   Virl Axe, MDn/a    Other Instructions N/a

## 2021-07-11 NOTE — Progress Notes (Signed)
Name: [name]   MRN: [id number]    DOB: [birth year]   Date:07/11/2021       Progress Note  Subjective  Chief Complaint  Chief Complaint  Patient presents with   Annual Exam    HPI  Patient presents for annual CPE.  IPSS Questionnaire (AUA-7): Over the past month   1)  How often have you had a sensation of not emptying your bladder completely after you finish urinating?  0 - Not at all  2)  How often have you had to urinate again less than two hours after you finished urinating? 1 - Less than 1 time in 5  3)  How often have you found you stopped and started again several times when you urinated?  0 - Not at all  4) How difficult have you found it to postpone urination?  0 - Not at all  5) How often have you had a weak urinary stream?  0 - Not at all  6) How often have you had to push or strain to begin urination?  0 - Not at all  7) How many times did you most typically get up to urinate from the time you went to bed until the time you got up in the morning?  1 - 1 time  Total score:  0-7 mildly symptomatic   8-19 moderately symptomatic   20-35 severely symptomatic     Diet: was vegetarian until recently, eating some lean proteins  Exercise: 3 days a week, cycling and walking and ellipcal   Depression: phq 9 is negative Depression screen [facility] 2/9 07/11/2021 06/19/2021 02/25/2020 02/17/2020 05/04/2019  Decreased Interest 0 0 0 0 0  Down, Depressed, Hopeless 0 0 0 0 0  PHQ - 2 Score 0 0 0 0 0  Altered sleeping 0 0 0 - 0  Tired, decreased energy 0 0 0 - 0  Change in appetite 0 0 0 - 0  Feeling bad or failure about yourself  0 0 0 - 0  Trouble concentrating 0 0 0 - 0  Moving slowly or fidgety/restless 0 0 0 - 0  Suicidal thoughts 0 0 0 - 0  PHQ-9 Score 0 0 0 - 0  Difficult doing work/chores Not difficult at all Not difficult at all Not difficult at all - Not difficult at all  Some recent data might be hidden    Hypertension:  BP Readings from Last 3 Encounters:   07/11/21 132/74  07/11/21 106/72  07/05/21 109/72    Obesity: Wt Readings from Last 3 Encounters:  07/11/21 200 lb 12.8 oz (91.1 kg)  07/11/21 199 lb (90.3 kg)  07/05/21 198 lb (89.8 kg)   BMI Readings from Last 3 Encounters:  07/11/21 28.81 kg/m  07/11/21 28.55 kg/m  07/05/21 28.41 kg/m     Lipids:  Lab Results  Component Value Date   CHOL 156 07/12/2020   CHOL 172 02/17/2020   CHOL 174 02/16/2019   Lab Results  Component Value Date   HDL 38 (L) 07/12/2020   HDL 38 (L) 02/17/2020   HDL 40 02/16/2019   Lab Results  Component Value Date   LDLCALC 98 07/12/2020   LDLCALC 106 (H) 02/17/2020   LDLCALC 114 (H) 02/16/2019   Lab Results  Component Value Date   TRIG 110 07/12/2020   TRIG 160 (H) 02/17/2020   TRIG 102 02/16/2019   Lab Results  Component Value Date   CHOLHDL 4.1 07/12/2020   CHOLHDL 4.5 02/17/2020  CHOLHDL 4.4 02/16/2019   No results found for: LDLDIRECT Glucose:  Glucose, Bld  Date Value Ref Range Status  06/19/2021 104 (H) 70 - 99 mg/dL Final    Comment:    Glucose reference range applies only to samples taken after fasting for at least 8 hours.  05/02/2021 99 70 - 99 mg/dL Final    Comment:    Glucose reference range applies only to samples taken after fasting for at least 8 hours.  05/01/2021 86 70 - 99 mg/dL Final    Comment:    Glucose reference range applies only to samples taken after fasting for at least 8 hours.   Glucose-Capillary  Date Value Ref Range Status  05/01/2021 94 70 - 99 mg/dL Final    Comment:    Glucose reference range applies only to samples taken after fasting for at least 8 hours.  04/30/2021 76 70 - 99 mg/dL Final    Comment:    Glucose reference range applies only to samples taken after fasting for at least 8 hours.  04/30/2021 79 70 - 99 mg/dL Final    Comment:    Glucose reference range applies only to samples taken after fasting for at least 8 hours.    [place] Visit from 02/25/2020 in  [facility]  AUDIT-C Score 0      Married STD testing and prevention (HIV/chl/gon/syphilis):  no Hep C Screening: 2018 negative  Skin cancer: Discussed monitoring for atypical lesions Colorectal cancer: Colonoscopy 2017, repeat 10 years Prostate cancer:  yes Lab Results  Component Value Date   PSA 0.73 02/17/2020   PSA 0.9 02/16/2019   PSA 1.1 02/13/2018     Lung cancer:  Low Dose CT Chest recommended if Age 19-80 years, 30 pack-year currently smoking OR have quit w/in 15years. Patient  no AAA: The USPSTF recommends one-time screening with ultrasonography in men ages 47 to 104 years who have ever smoked. Patient:  not applicable  Vaccines:   Tdap: UTD Shingrix: NA Pneumonia: politely declines  Flu: politely declines  COVID-19: 2 vaccines, no boosters  Advanced Care Planning: A voluntary discussion about advance care planning including the explanation and discussion of advance directives.  Discussed health care proxy and Living will, and the patient was able to identify a health care proxy as.  Patient does hve a livign will.   Patient Active Problem List   Diagnosis Date Noted   Long term (current) use of anticoagulants 05/05/2021   Disorder of prosthetic aortic valve 04/27/2021   Severe aortic stenosis 03/01/2021   Epigastric pain 05/09/2017   Lipoma of buttock 01/11/2017   ED (erectile dysfunction) 12/01/2016   Syncope 05/16/2016   Sustained ventricular tachycardia 05/16/2016   Ventricular tachycardia 05/16/2016   Prostate cancer screening 12/22/2015   Hearing loss of both ears 12/22/2015   Abnormal findings-gastrointestinal tract    IBD (inflammatory bowel disease) 09/23/2015   Pain in lower back 09/15/2015   Night sweats 09/15/2015   Groin pain 07/29/2015   Screening for STD (sexually transmitted disease) 07/29/2015   Preventative health care 05/10/2015   Presence of cardiac defibrillator 05/10/2015   Hypertension goal BP (blood pressure) <  140/90 12/13/2011   H/O aortic valve replacement 08/08/2009   Cardiomyopathy, secondary ([place]) 03/09/2009   History of ventricular tachycardia 03/09/2009    Past Surgical History:  Procedure Laterality Date   ANGIOPLASTY     RCA repair with vein angioplasty following injury with the aforementioned surgery   AORTIC VALVE  REPLACEMENT     Bioprosthesis   AORTIC VALVE REPLACEMENT N/A 04/27/2021   Procedure: REDO AORTIC VALVE REPLACEMENT (AVR) USING ON-X 23MM AORTIC VALVE;  Surgeon: Gaye Pollack, MD;  Location: [facility];  Service: Open Heart Surgery;  Laterality: N/A;   CARDIAC CATHETERIZATION     CARDIAC DEFIBRILLATOR PLACEMENT     CARDIAC VALVE REPLACEMENT     CHOLECYSTECTOMY     COLONOSCOPY WITH PROPOFOL N/A 12/06/2015   Procedure: COLONOSCOPY WITH PROPOFOL;  Surgeon: [name], MD;  Location: [facility] ENDOSCOPY;  Service: Endoscopy;  Laterality: N/A;   CORONARY ANGIOPLASTY     CORONARY ARTERY BYPASS GRAFT     IMPLANTABLE CARDIOVERTER DEFIBRILLATOR GENERATOR CHANGE N/A 05/05/2013   Procedure: IMPLANTABLE CARDIOVERTER DEFIBRILLATOR GENERATOR CHANGE;  Surgeon: Deboraha Sprang, MD;  Location: [facility] CATH LAB;  Service: Cardiovascular;  Laterality: N/A;   INSERT / REPLACE / REMOVE PACEMAKER     RIGHT/LEFT HEART CATH AND CORONARY ANGIOGRAPHY N/A 03/01/2021   Procedure: RIGHT/LEFT HEART CATH AND CORONARY ANGIOGRAPHY;  Surgeon: Sherren Mocha, MD;  Location: [facility] CV LAB;  Service: Cardiovascular;  Laterality: N/A;   TEE WITHOUT CARDIOVERSION N/A 04/27/2021   Procedure: TRANSESOPHAGEAL ECHOCARDIOGRAM (TEE);  Surgeon: Gaye Pollack, MD;  Location: [facility];  Service: Open Heart Surgery;  Laterality: N/A;   VSD REPAIR      Family History  Problem Relation Age of Onset   Heart disease Father    Hypertension Maternal Grandmother    Cancer Maternal Grandmother    Hypertension Maternal Grandfather    Cancer Maternal Grandfather     Social History   Socioeconomic History   Marital status:  Married    Spouse name: Anderson [place]   Number of children: 2   Years of education: 17   Highest education level: Bachelor's degree (e.g., BA, AB, BS)  Occupational History   Occupation: Full time     Employer: [employer]    Comment: In IT dept at [employer]  Tobacco Use   Smoking status: Never   Smokeless tobacco: Never  Vaping Use   Vaping Use: Never used  Substance and Sexual Activity   Alcohol use: No   Drug use: No   Sexual activity: Yes    Partners: Female  Other Topics Concern   Not on file  Social History Narrative   Married   Social Determinants of Health   Financial Resource Strain: Not on file  Food Insecurity: Not on file  Transportation Needs: Not on file  Physical Activity: Not on file  Stress: Not on file  Social Connections: Not on file  Intimate Partner Violence: Not on file     Current Outpatient Medications:    acetaminophen (TYLENOL) 500 MG tablet, Take 500 mg by mouth every 6 (six) hours as needed for moderate pain., Disp: , Rfl:    aspirin EC 81 MG EC tablet, Take 1 tablet (81 mg total) by mouth daily. Swallow whole., Disp: 30 tablet, Rfl: 11   carvedilol (COREG) 6.25 MG tablet, Take 1 tablet (6.25 mg total) by mouth 2 (two) times daily., Disp: 180 tablet, Rfl: 3   fluticasone (FLONASE) 50 MCG/ACT nasal spray, Place 2 sprays into both nostrils daily as needed for allergies., Disp: , Rfl:    fluticasone (FLONASE) 50 MCG/ACT nasal spray, Place into the nose., Disp: , Rfl:    loratadine (CLARITIN) 10 MG tablet, Take by mouth as needed., Disp: , Rfl:    magnesium oxide (MAG-OX) 400 MG tablet, Take 1 tablet (400 mg total)  by mouth 2 (two) times daily., Disp: 180 tablet, Rfl: 3   meclizine (ANTIVERT) 25 MG tablet, Take 0.5-1 tablets (12.5-25 mg total) by mouth 3 (three) times daily as needed for dizziness., Disp: 30 tablet, Rfl: 2   Multiple Vitamin (MULTIVITAMIN) tablet, Take 2 tablets by mouth daily., Disp: , Rfl:    ondansetron (ZOFRAN-ODT) 4 MG disintegrating  tablet, Take by mouth as needed., Disp: , Rfl:    oxyCODONE (OXY IR/ROXICODONE) 5 MG immediate release tablet, Take 1 tablet (5 mg total) by mouth every 4 (four) hours as needed for severe pain., Disp: 30 tablet, Rfl: 0   potassium chloride SA (KLOR-CON M) 20 MEQ tablet, Take 1 tablet (20 mEq total) by mouth daily. For 4 days then stop., Disp: 4 tablet, Rfl: 0   ranolazine (RANEXA) 1000 MG SR tablet, Take 1 tablet (1,000 mg total) by mouth 2 (two) times daily., Disp: 180 tablet, Rfl: 3   sacubitril-valsartan (ENTRESTO) 24-26 MG, Take 1 tablet by mouth 2 (two) times daily., Disp: 180 tablet, Rfl: 3   spironolactone (ALDACTONE) 25 MG tablet, Take 0.5 tablets (12.5 mg total) by mouth daily., Disp: 45 tablet, Rfl: 3   torsemide (DEMADEX) 20 MG tablet, Take 1 tablet (20 mg total) by mouth daily. For 4 days then stop., Disp: 4 tablet, Rfl: 0   triamcinolone cream (KENALOG) 0.1 %, Apply 1 application topically 2 (two) times daily as needed., Disp: 30 g, Rfl: 0   triamcinolone cream (KENALOG) 0.1 %, Apply topically as needed., Disp: , Rfl:    umeclidinium bromide (INCRUSE ELLIPTA) 62.5 MCG/ACT AEPB, Inhale 1 puff into the lungs as needed., Disp: , Rfl:    warfarin (COUMADIN) 5 MG tablet, TAKE 1 AND 1/2 TABLETS BY MOUTH DAILY OR AS DIRECTED BY THE COUMADIN CLINIC, Disp: 50 tablet, Rfl: 3  Allergies  Allergen Reactions   Shellfish Allergy Nausea And Vomiting     Review of Systems  Constitutional: Negative.   Respiratory: Negative.    Cardiovascular: Negative.   Gastrointestinal: Negative.   Genitourinary: Negative.    Objective  Vitals:   07/11/21 1408  BP: 132/74  Pulse: 95  Resp: 16  Temp: (!) 97.2 F (36.2 C)  SpO2: 98%  Weight: 200 lb 12.8 oz (91.1 kg)  Height: 5\' 10"  (1.778 m)    Body mass index is 28.81 kg/m.  Physical Exam Constitutional:      Appearance: Normal appearance.  HENT:     Head: Normocephalic and atraumatic.  Eyes:     Conjunctiva/sclera: Conjunctivae normal.   Cardiovascular:     Rate and Rhythm: Normal rate and regular rhythm.  Pulmonary:     Effort: Pulmonary effort is normal.     Breath sounds: Normal breath sounds.  Musculoskeletal:     Right lower leg: No edema.     Left lower leg: No edema.  Skin:    General: Skin is warm and dry.  Neurological:     General: No focal deficit present.     Mental Status: He is alert. Mental status is at baseline.  Psychiatric:        Mood and Affect: Mood normal.        Behavior: Behavior normal.    Recent Results (from the past 2160 hour(s))  CBC     Status: Abnormal   Collection Time: 04/25/21  2:28 PM  Result Value Ref Range   WBC 4.3 4.0 - 10.5 K/uL   RBC 4.76 4.22 - 5.81 MIL/uL   Hemoglobin 14.2 13.0 -  17.0 g/dL   HCT 42.6 39.0 - 52.0 %   MCV 89.5 80.0 - 100.0 fL   MCH 29.8 26.0 - 34.0 pg   MCHC 33.3 30.0 - 36.0 g/dL   RDW 11.1 (L) 11.5 - 15.5 %   Platelets 160 150 - 400 K/uL   nRBC 0.0 0.0 - 0.2 %    Comment: Performed at [facility] [address] [place], [place] [address]  Comprehensive metabolic panel     Status: Abnormal   Collection Time: 04/25/21  2:28 PM  Result Value Ref Range   Sodium 135 135 - 145 mmol/L   Potassium 4.2 3.5 - 5.1 mmol/L    Comment: SLIGHT HEMOLYSIS   Chloride 102 98 - 111 mmol/L   CO2 25 22 - 32 mmol/L   Glucose, Bld 94 70 - 99 mg/dL    Comment: Glucose reference range applies only to samples taken after fasting for at least 8 hours.   BUN 12 6 - 20 mg/dL   Creatinine, Ser 1.18 0.61 - 1.24 mg/dL   Calcium 9.3 8.9 - 10.3 mg/dL   Total Protein 7.3 6.5 - 8.1 g/dL   Albumin 4.1 3.5 - 5.0 g/dL   AST 34 15 - 41 U/L   ALT 15 0 - 44 U/L   Alkaline Phosphatase 27 (L) 38 - 126 U/L   Total Bilirubin 1.1 0.3 - 1.2 mg/dL   GFR, Estimated >60 >60 mL/min    Comment: (NOTE) Calculated using the CKD-EPI Creatinine Equation (2021)    Anion gap 8 5 - 15    Comment: Performed at [facility] [address] [place], [place] [address]  Protime-INR      Status: None   Collection Time: 04/25/21  2:28 PM  Result Value Ref Range   Prothrombin Time 14.9 11.4 - 15.2 seconds   INR 1.2 0.8 - 1.2    Comment: (NOTE) INR goal varies based on device and disease states. Performed at [facility], [place] [address] [place], [place] [address]   APTT     Status: None   Collection Time: 04/25/21  2:28 PM  Result Value Ref Range   aPTT 32 24 - 36 seconds    Comment: Performed at [facility] [address], [place], [place] [address]  Urinalysis, Routine w reflex microscopic     Status: None   Collection Time: 04/25/21  2:28 PM  Result Value Ref Range   Color, Urine YELLOW YELLOW   APPearance CLEAR CLEAR   Specific Gravity, Urine 1.020 1.005 - 1.030   pH 6.0 5.0 - 8.0   Glucose, UA NEGATIVE NEGATIVE mg/dL   Hgb urine dipstick NEGATIVE NEGATIVE   Bilirubin Urine NEGATIVE NEGATIVE   Ketones, ur NEGATIVE NEGATIVE mg/dL   Protein, ur NEGATIVE NEGATIVE mg/dL   Nitrite NEGATIVE NEGATIVE   Leukocytes,Ua NEGATIVE NEGATIVE    Comment: Microscopic not done on urines with negative protein, blood, leukocytes, nitrite, or glucose < 500 mg/dL. Performed at [facility], [place] [address] [place], [place] [address]   Hemoglobin A1c     Status: Abnormal   Collection Time: 04/25/21  2:28 PM  Result Value Ref Range   Hgb A1c MFr Bld 4.5 (L) 4.8 - 5.6 %    Comment: (NOTE) Pre diabetes:          5.7%-6.4%  Diabetes:              >6.4%  Glycemic control for   <  7.0% adults with diabetes    Mean Plasma Glucose 82.45 mg/dL    Comment: Performed at [facility], [place] [address], [place], [place] [address]  Surgical pcr screen     Status: None   Collection Time: 04/25/21  2:29 PM   Specimen: Nasal Mucosa; Nasal Swab  Result Value Ref Range   MRSA, PCR NEGATIVE NEGATIVE   Staphylococcus aureus NEGATIVE NEGATIVE    Comment: (NOTE) The Xpert SA Assay (FDA approved for NASAL specimens in patients 26 years of age and older), is one  component of a comprehensive surveillance program. It is not intended to diagnose infection nor to guide or monitor treatment. Performed at [facility], [place] [address], [place], [place] [address]   SARS CORONAVIRUS 2 (TAT 6-24 HRS) Nasopharyngeal Nasopharyngeal Swab     Status: None   Collection Time: 04/25/21  2:59 PM   Specimen: Nasopharyngeal Swab  Result Value Ref Range   SARS Coronavirus 2 NEGATIVE NEGATIVE    Comment: (NOTE) SARS-CoV-2 target nucleic acids are NOT DETECTED.  The SARS-CoV-2 RNA is generally detectable in upper and lower respiratory specimens during the acute phase of infection. Negative results do not preclude SARS-CoV-2 infection, do not rule out co-infections with other pathogens, and should not be used as the sole basis for treatment or other patient management decisions. Negative results must be combined with clinical observations, patient history, and epidemiological information. The expected result is Negative.  Fact Sheet for Patients: SugarRoll.be  Fact Sheet for Healthcare Providers: https://www.woods-mathews.com/  This test is not yet approved or cleared by the [place] FDA and  has been authorized for detection and/or diagnosis of SARS-CoV-2 by FDA under an Emergency Use Authorization (EUA). This EUA will remain  in effect (meaning this test can be used) for the duration of the COVID-19 declaration under Se ction 564(b)(1) of the Act, 21 U.S.C. section 360bbb-3(b)(1), unless the authorization is terminated or revoked sooner.  Performed at [facility], [place] [address] [place], [place] [address]   Blood gas, arterial on room air     Status: Abnormal   Collection Time: 04/25/21  3:07 PM  Result Value Ref Range   FIO2 21.00    pH, Arterial 7.391 7.350 - 7.450   pCO2 arterial 43.0 32.0 - 48.0 mmHg   pO2, Arterial 116 (H) 83.0 - 108.0 mmHg   Bicarbonate 25.6 20.0 - 28.0 mmol/L    Acid-Base Excess 1.2 0.0 - 2.0 mmol/L   O2 Saturation 98.5 %   Patient temperature 37.0    Collection site LEFT BRACHIAL    Drawn by [id number]     Comment: COLLECTED BY RT   Sample type ARTERIAL DRAW    Allens test (pass/fail) PASS PASS    Comment: Performed at [facility] [address], [place], [place] [address]  Type and screen     Status: None   Collection Time: 04/25/21  3:07 PM  Result Value Ref Range   ABO/RH(D) O POS    Antibody Screen NEG    Sample Expiration 04/30/2021,2359    Extend sample reason NO TRANSFUSIONS OR PREGNANCY IN THE PAST 3 MONTHS    Unit Number [id number]    Blood Component Type RED CELLS,LR    Unit division 00    Status of Unit REL FROM [facility]    Transfusion Status OK TO TRANSFUSE    Crossmatch Result Compatible    Unit Number [id number]    Blood Component Type RED  CELLS,LR    Unit division 00    Status of Unit REL FROM [facility]    Transfusion Status OK TO TRANSFUSE    Crossmatch Result Compatible    Unit Number [id number]    Blood Component Type RED CELLS,LR    Unit division 00    Status of Unit REL FROM [facility]    Transfusion Status OK TO TRANSFUSE    Crossmatch Result      Compatible Performed at [facility], [place] [address] [place], [place] [address]    Unit Number [id number]    Blood Component Type RED CELLS,LR    Unit division 00    Status of Unit REL FROM [facility]    Transfusion Status OK TO TRANSFUSE    Crossmatch Result Compatible    Unit Number [id number]    Blood Component Type RED CELLS,LR    Unit division 00    Status of Unit REL FROM [facility]    Transfusion Status OK TO TRANSFUSE    Crossmatch Result Compatible    Unit Number [id number]    Blood Component Type RED CELLS,LR    Unit division 00    Status of Unit REL FROM [facility]    Transfusion Status OK TO TRANSFUSE    Crossmatch Result Compatible   BPAM RBC     Status: None   Collection Time: 04/25/21  3:07 PM  Result Value Ref Range   ISSUE DATE / TIME  676195093267    Blood Product Unit Number [id number]    PRODUCT CODE [id number]    Unit Type and Rh 5100    Blood Product Expiration Date 505397673419    ISSUE DATE / TIME 379024097353    Blood Product Unit Number [id number]    PRODUCT CODE [id number]    Unit Type and Rh 5100    Blood Product Expiration Date 211941740814    ISSUE DATE / TIME 481856314970    Blood Product Unit Number [id number]    PRODUCT CODE [id number]    Unit Type and Rh 5100    Blood Product Expiration Date 672094709628    ISSUE DATE / TIME 366294765465    Blood Product Unit Number [id number]    PRODUCT CODE [id number]    Unit Type and Rh 5100    Blood Product Expiration Date 496759163846    Blood Product Unit Number [id number]    PRODUCT CODE [id number]    Unit Type and Rh 5100    Blood Product Expiration Date 300762263335    Blood Product Unit Number [id number]    PRODUCT CODE [id number]    Unit Type and Rh 5100    Blood Product Expiration Date 726203559741   ABO/Rh     Status: None   Collection Time: 04/27/21  6:43 AM  Result Value Ref Range   ABO/RH(D)      O POS Performed at [facility] [address], [place], [place] [address]   Prepare RBC (crossmatch)     Status: None   Collection Time: 04/27/21  7:39 AM  Result Value Ref Range   Order Confirmation      ORDER PROCESSED BY BLOOD BANK Performed at [facility], [place] [address], [place], [place] [address]   I-STAT 7, (LYTES, BLD GAS, ICA, H+H)     Status: Abnormal   Collection Time: 04/27/21  8:04 AM  Result Value Ref Range   pH, Arterial 7.376 7.350 - 7.450   pCO2 arterial 46.4 32.0 - 48.0 mmHg   pO2, Arterial  547 (H) 83.0 - 108.0 mmHg   Bicarbonate 27.2 20.0 - 28.0 mmol/L   TCO2 29 22 - 32 mmol/L   O2 Saturation 100.0 %   Acid-Base Excess 1.0 0.0 - 2.0 mmol/L   Sodium 138 135 - 145 mmol/L   Potassium 4.0 3.5 - 5.1 mmol/L   Calcium, Ion 1.26 1.15 - 1.40 mmol/L   HCT 39.0 39.0 - 52.0 %   Hemoglobin 13.3 13.0 - 17.0  g/dL   Sample type ARTERIAL   I-STAT, chem 8     Status: Abnormal   Collection Time: 04/27/21  8:08 AM  Result Value Ref Range   Sodium 139 135 - 145 mmol/L   Potassium 4.0 3.5 - 5.1 mmol/L   Chloride 103 98 - 111 mmol/L   BUN 18 6 - 20 mg/dL   Creatinine, Ser 0.90 0.61 - 1.24 mg/dL   Glucose, Bld 116 (H) 70 - 99 mg/dL    Comment: Glucose reference range applies only to samples taken after fasting for at least 8 hours.   Calcium, Ion 1.28 1.15 - 1.40 mmol/L   TCO2 29 22 - 32 mmol/L   Hemoglobin 12.9 (L) 13.0 - 17.0 g/dL   HCT 38.0 (L) 39.0 - 52.0 %  ECHO INTRAOPERATIVE TEE     Status: None   Collection Time: 04/27/21  8:44 AM  Result Value Ref Range   Weight 3,104 oz   Height 70 in   BP 116/84 mmHg   AV Mean grad 35.3 mmHg   AV Peak grad 46.7 mmHg   Ao pk vel 3.42 m/s   P 1/2 time 399 msec  I-STAT, chem 8     Status: Abnormal   Collection Time: 04/27/21  9:29 AM  Result Value Ref Range   Sodium 139 135 - 145 mmol/L   Potassium 4.2 3.5 - 5.1 mmol/L   Chloride 105 98 - 111 mmol/L   BUN 15 6 - 20 mg/dL   Creatinine, Ser 0.90 0.61 - 1.24 mg/dL   Glucose, Bld 133 (H) 70 - 99 mg/dL    Comment: Glucose reference range applies only to samples taken after fasting for at least 8 hours.   Calcium, Ion 1.21 1.15 - 1.40 mmol/L   TCO2 25 22 - 32 mmol/L   Hemoglobin 13.3 13.0 - 17.0 g/dL   HCT 39.0 39.0 - 52.0 %  I-STAT 7, (LYTES, BLD GAS, ICA, H+H)     Status: Abnormal   Collection Time: 04/27/21  9:46 AM  Result Value Ref Range   pH, Arterial 7.425 7.350 - 7.450   pCO2 arterial 40.1 32.0 - 48.0 mmHg   pO2, Arterial 365 (H) 83.0 - 108.0 mmHg   Bicarbonate 26.3 20.0 - 28.0 mmol/L   TCO2 28 22 - 32 mmol/L   O2 Saturation 100.0 %   Acid-Base Excess 2.0 0.0 - 2.0 mmol/L   Sodium 140 135 - 145 mmol/L   Potassium 4.3 3.5 - 5.1 mmol/L   Calcium, Ion 1.03 (L) 1.15 - 1.40 mmol/L   HCT 31.0 (L) 39.0 - 52.0 %   Hemoglobin 10.5 (L) 13.0 - 17.0 g/dL   Sample type ARTERIAL   POCT I-Stat  EG7     Status: Abnormal   Collection Time: 04/27/21  9:52 AM  Result Value Ref Range   pH, Ven 7.413 7.250 - 7.430   pCO2, Ven 38.7 (L) 44.0 - 60.0 mmHg   pO2, Ven 54.0 (H) 32.0 - 45.0 mmHg   Bicarbonate 24.7 20.0 - 28.0 mmol/L  TCO2 26 22 - 32 mmol/L   O2 Saturation 88.0 %   Acid-Base Excess 0.0 0.0 - 2.0 mmol/L   Sodium 140 135 - 145 mmol/L   Potassium 4.3 3.5 - 5.1 mmol/L   Calcium, Ion 1.04 (L) 1.15 - 1.40 mmol/L   HCT 31.0 (L) 39.0 - 52.0 %   Hemoglobin 10.5 (L) 13.0 - 17.0 g/dL   Sample type VENOUS   I-STAT, chem 8     Status: Abnormal   Collection Time: 04/27/21 10:30 AM  Result Value Ref Range   Sodium 137 135 - 145 mmol/L   Potassium 6.4 (HH) 3.5 - 5.1 mmol/L   Chloride 106 98 - 111 mmol/L   BUN 13 6 - 20 mg/dL   Creatinine, Ser 0.80 0.61 - 1.24 mg/dL   Glucose, Bld 118 (H) 70 - 99 mg/dL    Comment: Glucose reference range applies only to samples taken after fasting for at least 8 hours.   Calcium, Ion 0.95 (L) 1.15 - 1.40 mmol/L   TCO2 25 22 - 32 mmol/L   Hemoglobin 8.8 (L) 13.0 - 17.0 g/dL   HCT 26.0 (L) 39.0 - 52.0 %  I-STAT 7, (LYTES, BLD GAS, ICA, H+H)     Status: Abnormal   Collection Time: 04/27/21 10:59 AM  Result Value Ref Range   pH, Arterial 7.374 7.350 - 7.450   pCO2 arterial 38.4 32.0 - 48.0 mmHg   pO2, Arterial 244 (H) 83.0 - 108.0 mmHg   Bicarbonate 22.4 20.0 - 28.0 mmol/L   TCO2 24 22 - 32 mmol/L   O2 Saturation 100.0 %   Acid-base deficit 3.0 (H) 0.0 - 2.0 mmol/L   Sodium 139 135 - 145 mmol/L   Potassium 5.3 (H) 3.5 - 5.1 mmol/L   Calcium, Ion 0.89 (LL) 1.15 - 1.40 mmol/L   HCT 27.0 (L) 39.0 - 52.0 %   Hemoglobin 9.2 (L) 13.0 - 17.0 g/dL   Sample type ARTERIAL   Surgical pathology     Status: None   Collection Time: 04/27/21 11:20 AM  Result Value Ref Range   SURGICAL PATHOLOGY      SURGICAL PATHOLOGY CASE: [id number] PATIENT: [place] Moustafa Surgical Pathology Report     Clinical History: severe aortic stenosis (cm)     FINAL  MICROSCOPIC DIAGNOSIS:  A. HEART VALVE, AORTIC: -Gross diagnosis only: Specimen grossly consistent with explanted artificial valve.  GROSS DESCRIPTION:  The specimen is received fresh and consists of a disrupted ring of tan-yellow foreign material, wire, and mesh measuring 3.5 x 3.2 x 1.5 cm.  The leaflets display mild to moderate yellow-red calcified nodules. Blue suture material is identified embedded within the periphery of the specimen.  No sections are submitted, gross exam only.  Craig Staggers 04/27/2021)    Final Diagnosis performed by Unknown Jim, MD.   Electronically signed 04/28/2021 Technical and / or Professional components performed at [facility]. [facility], [place] [address] [place], [place] [address].  Immunohistochemistry Technical component (if applicable) was performed at [facility] [address], [place], [place], [place] [address].   IMMUNOHISTOCHEMISTRY DISCLAIMER (if applicable): Some of these immunohistochemical stains may have been developed and the performance characteristics determine by [facility]. Some may not have been cleared or approved by the U.S. Food and Drug Administration. The FDA has determined that such clearance or approval is not necessary. This test is used for clinical purposes. It should not be regarded as investigational or for research. This laboratory is certified  under the Clinical Laboratory Improvement Amendments of 1988 (CLIA-88) as qualified to perform high complexity clinical laboratory testing.  The controls stained appropriately.   I-STAT, chem 8     Status: Abnormal   Collection Time: 04/27/21 11:30 AM  Result Value Ref Range   Sodium 138 135 - 145 mmol/L   Potassium 5.5 (H) 3.5 - 5.1 mmol/L   Chloride 101 98 - 111 mmol/L   BUN 13 6 - 20 mg/dL   Creatinine, Ser 0.70 0.61 - 1.24 mg/dL   Glucose, Bld 118 (H) 70 - 99 mg/dL    Comment: Glucose reference range applies only to samples taken  after fasting for at least 8 hours.   Calcium, Ion 0.88 (LL) 1.15 - 1.40 mmol/L   TCO2 24 22 - 32 mmol/L   Hemoglobin 8.8 (L) 13.0 - 17.0 g/dL   HCT 26.0 (L) 39.0 - 52.0 %  I-STAT 7, (LYTES, BLD GAS, ICA, H+H)     Status: Abnormal   Collection Time: 04/27/21 11:59 AM  Result Value Ref Range   pH, Arterial 7.388 7.350 - 7.450   pCO2 arterial 43.1 32.0 - 48.0 mmHg   pO2, Arterial 248 (H) 83.0 - 108.0 mmHg   Bicarbonate 26.0 20.0 - 28.0 mmol/L   TCO2 27 22 - 32 mmol/L   O2 Saturation 100.0 %   Acid-Base Excess 1.0 0.0 - 2.0 mmol/L   Sodium 140 135 - 145 mmol/L   Potassium 5.6 (H) 3.5 - 5.1 mmol/L   Calcium, Ion 0.82 (LL) 1.15 - 1.40 mmol/L   HCT 28.0 (L) 39.0 - 52.0 %   Hemoglobin 9.5 (L) 13.0 - 17.0 g/dL   Sample type ARTERIAL   Hemoglobin and hematocrit, blood     Status: Abnormal   Collection Time: 04/27/21 12:15 PM  Result Value Ref Range   Hemoglobin 10.1 (L) 13.0 - 17.0 g/dL    Comment: REPEATED TO VERIFY RESULT CALLED TO, READ BACK BY AND VERIFIED WITH: M.HARDEY RN [id number] 04/27/21 MCCORMICK K    HCT 29.1 (L) 39.0 - 52.0 %    Comment: Performed at [facility] [address], [place], [place] [address]  Platelet count     Status: Abnormal   Collection Time: 04/27/21 12:15 PM  Result Value Ref Range   Platelets 73 (L) 150 - 400 K/uL    Comment: Immature Platelet Fraction may be clinically indicated, consider ordering this additional test [id number] REPEATED TO VERIFY PLATELET COUNT CONFIRMED BY SMEAR Performed at [facility], [place] [address] [place], [place] [address]   I-STAT, [place] Clap 8     Status: Abnormal   Collection Time: 04/27/21 12:34 PM  Result Value Ref Range   Sodium 141 135 - 145 mmol/L   Potassium 6.0 (H) 3.5 - 5.1 mmol/L   Chloride 104 98 - 111 mmol/L   BUN 12 6 - 20 mg/dL   Creatinine, Ser 0.70 0.61 - 1.24 mg/dL   Glucose, Bld 131 (H) 70 - 99 mg/dL    Comment: Glucose reference range applies only to samples taken after fasting for at least 8  hours.   Calcium, Ion 0.87 (LL) 1.15 - 1.40 mmol/L   TCO2 26 22 - 32 mmol/L   Hemoglobin 9.9 (L) 13.0 - 17.0 g/dL   HCT 29.0 (L) 39.0 - 52.0 %  Prepare platelet pheresis     Status: None   Collection Time: 04/27/21 12:36 PM  Result Value Ref Range   Unit Number [id number]    Blood  Component Type PLTP2 PSORALEN TREATED    Unit division 00    Status of Unit ISSUED,FINAL    Transfusion Status OK TO TRANSFUSE    Unit Number [id number]    Blood Component Type PSORALEN TREATED    Unit division 00    Status of Unit ISSUED,FINAL    Transfusion Status      OK TO TRANSFUSE Performed at [facility], [address], [place], [place] [address]   BPAM Platelet Pheresis     Status: None   Collection Time: 04/27/21 12:36 PM  Result Value Ref Range   ISSUE DATE / TIME 462703500938    Blood Product Unit Number [id number]    PRODUCT CODE [id number]    Unit Type and Rh 6200    Blood Product Expiration Date 202212092359    ISSUE DATE / TIME 102585277824    Blood Product Unit Number [id number]    PRODUCT CODE E8340V00    Unit Type and Rh 0600    Blood Product Expiration Date 086761950932   I-STAT 7, (LYTES, BLD GAS, ICA, H+H)     Status: Abnormal   Collection Time: 04/27/21  1:04 PM  Result Value Ref Range   pH, Arterial 7.415 7.350 - 7.450   pCO2 arterial 40.3 32.0 - 48.0 mmHg   pO2, Arterial 229 (H) 83.0 - 108.0 mmHg   Bicarbonate 25.9 20.0 - 28.0 mmol/L   TCO2 27 22 - 32 mmol/L   O2 Saturation 100.0 %   Acid-Base Excess 1.0 0.0 - 2.0 mmol/L   Sodium 141 135 - 145 mmol/L   Potassium 5.6 (H) 3.5 - 5.1 mmol/L   Calcium, Ion 0.91 (L) 1.15 - 1.40 mmol/L   HCT 27.0 (L) 39.0 - 52.0 %   Hemoglobin 9.2 (L) 13.0 - 17.0 g/dL   Sample type ARTERIAL   I-STAT, chem 8     Status: Abnormal   Collection Time: 04/27/21  1:36 PM  Result Value Ref Range   Sodium 143 135 - 145 mmol/L   Potassium 4.0 3.5 - 5.1 mmol/L   Chloride 105 98 - 111 mmol/L   BUN 11 6 - 20 mg/dL   Creatinine,  Ser 0.80 0.61 - 1.24 mg/dL   Glucose, Bld 140 (H) 70 - 99 mg/dL    Comment: Glucose reference range applies only to samples taken after fasting for at least 8 hours.   Calcium, Ion 1.03 (L) 1.15 - 1.40 mmol/L   TCO2 25 22 - 32 mmol/L   Hemoglobin 8.2 (L) 13.0 - 17.0 g/dL   HCT 24.0 (L) 39.0 - 52.0 %  I-STAT 7, (LYTES, BLD GAS, ICA, H+H)     Status: Abnormal   Collection Time: 04/27/21  1:45 PM  Result Value Ref Range   pH, Arterial 7.408 7.350 - 7.450   pCO2 arterial 39.2 32.0 - 48.0 mmHg   pO2, Arterial 358 (H) 83.0 - 108.0 mmHg   Bicarbonate 24.7 20.0 - 28.0 mmol/L   TCO2 26 22 - 32 mmol/L   O2 Saturation 100.0 %   Acid-Base Excess 0.0 0.0 - 2.0 mmol/L   Sodium 144 135 - 145 mmol/L   Potassium 3.9 3.5 - 5.1 mmol/L   Calcium, Ion 1.01 (L) 1.15 - 1.40 mmol/L   HCT 26.0 (L) 39.0 - 52.0 %   Hemoglobin 8.8 (L) 13.0 - 17.0 g/dL   Sample type ARTERIAL   CBC     Status: Abnormal   Collection Time: 04/27/21  3:07 PM  Result Value Ref Range  WBC 10.2 4.0 - 10.5 K/uL   RBC 2.83 (L) 4.22 - 5.81 MIL/uL   Hemoglobin 8.6 (L) 13.0 - 17.0 g/dL   HCT 24.8 (L) 39.0 - 52.0 %   MCV 87.6 80.0 - 100.0 fL   MCH 30.4 26.0 - 34.0 pg   MCHC 34.7 30.0 - 36.0 g/dL   RDW 11.1 (L) 11.5 - 15.5 %   Platelets 76 (L) 150 - 400 K/uL    Comment: Immature Platelet Fraction may be clinically indicated, consider ordering this additional test [id number] CONSISTENT WITH PREVIOUS RESULT REPEATED TO VERIFY    nRBC 0.0 0.0 - 0.2 %    Comment: Performed at [facility], [place] [address], [place], [place] [address]  Protime-INR     Status: Abnormal   Collection Time: 04/27/21  3:07 PM  Result Value Ref Range   Prothrombin Time 23.0 (H) 11.4 - 15.2 seconds   INR 2.0 (H) 0.8 - 1.2    Comment: (NOTE) INR goal varies based on device and disease states. Performed at [facility], [place] [address], [place], [place] [address]   APTT     Status: Abnormal   Collection Time: 04/27/21  3:07 PM  Result Value  Ref Range   aPTT 52 (H) 24 - 36 seconds    Comment:        IF BASELINE aPTT IS ELEVATED, SUGGEST PATIENT RISK ASSESSMENT BE USED TO DETERMINE APPROPRIATE ANTICOAGULANT THERAPY. Performed at [facility], [place] [address] [place], [place] [address]   Fibrinogen     Status: Abnormal   Collection Time: 04/27/21  3:07 PM  Result Value Ref Range   Fibrinogen 108 (L) 210 - 475 mg/dL    Comment: (NOTE) Fibrinogen results may be underestimated in patients receiving thrombolytic therapy. Performed at [facility], [place] [address], [place],  [address]   Glucose, capillary     Status: None   Collection Time: 04/27/21  3:07 PM  Result Value Ref Range   Glucose-Capillary 99 70 - 99 mg/dL    Comment: Glucose reference range applies only to samples taken after fasting for at least 8 hours.  I-STAT 7, (LYTES, BLD GAS, ICA, H+H)     Status: Abnormal   Collection Time: 04/27/21  3:09 PM  Result Value Ref Range   pH, Arterial 7.413 7.350 - 7.450   pCO2 arterial 37.0 32.0 - 48.0 mmHg   pO2, Arterial 203 (H) 83.0 - 108.0 mmHg   Bicarbonate 24.3 20.0 - 28.0 mmol/L   TCO2 26 22 - 32 mmol/L   O2 Saturation 100.0 %   Acid-base deficit 1.0 0.0 - 2.0 mmol/L   Sodium 146 (H) 135 - 145 mmol/L   Potassium 3.6 3.5 - 5.1 mmol/L   Calcium, Ion 1.03 (L) 1.15 - 1.40 mmol/L   HCT 26.0 (L) 39.0 - 52.0 %   Hemoglobin 8.8 (L) 13.0 - 17.0 g/dL   Patient temperature 34.1 C    Sample type ARTERIAL   Glucose, capillary     Status: Abnormal   Collection Time: 04/27/21  4:00 PM  Result Value Ref Range   Glucose-Capillary 126 (H) 70 - 99 mg/dL    Comment: Glucose reference range applies only to samples taken after fasting for at least 8 hours.  Glucose, capillary     Status: Abnormal   Collection Time: 04/27/21  4:52 PM  Result Value Ref Range   Glucose-Capillary 112 (H) 70 - 99 mg/dL    Comment:  Glucose reference range applies only to samples taken after fasting for at least 8 hours.  Glucose,  capillary     Status: Abnormal   Collection Time: 04/27/21  5:50 PM  Result Value Ref Range   Glucose-Capillary 105 (H) 70 - 99 mg/dL    Comment: Glucose reference range applies only to samples taken after fasting for at least 8 hours.  Prepare fresh frozen plasma     Status: None   Collection Time: 04/27/21  6:17 PM  Result Value Ref Range   Unit Number [id number]    Blood Component Type THAWED PLASMA    Unit division 00    Status of Unit ISSUED,FINAL    Transfusion Status      OK TO TRANSFUSE Performed at [facility] [address] [place], [place] [address]    Unit Number [id number]    Blood Component Type THAWED PLASMA    Unit division 00    Status of Unit ISSUED,FINAL    Transfusion Status OK TO TRANSFUSE   BPAM FFP     Status: None   Collection Time: 04/27/21  6:17 PM  Result Value Ref Range   ISSUE DATE / TIME 163845364680    Blood Product Unit Number [id number]    PRODUCT CODE E2720V00    Unit Type and Rh 0488    Blood Product Expiration Date 202212132359    ISSUE DATE / TIME 891694503888    Blood Product Unit Number [id number]    PRODUCT CODE E2720V00    Unit Type and Rh 0600    Blood Product Expiration Date 569794801655   Prepare cryoprecipitate     Status: None   Collection Time: 04/27/21  6:18 PM  Result Value Ref Range   Unit Number [id number]    Blood Component Type CRYPOOL THAW    Unit division 00    Status of Unit ISSUED,FINAL    Transfusion Status      OK TO TRANSFUSE Performed at [facility], [place] [address], [place], [place] [address]    Unit Number [id number]    Blood Component Type CRYPOOL THAW    Unit division 00    Status of Unit ISSUED,FINAL    Transfusion Status OK TO TRANSFUSE   BPAM Cryoprecipitate     Status: None   Collection Time: 04/27/21  6:18 PM  Result Value Ref Range   ISSUE DATE / TIME 549826415830    Blood Product Unit Number [id number]    PRODUCT CODE P5945O59    Unit Type and Rh 5100     Blood Product Expiration Date 202212090025    ISSUE DATE / TIME 292446286381    Blood Product Unit Number [id number]    PRODUCT CODE X8329V91    Unit Type and Rh 5100    Blood Product Expiration Date 660600459977   Glucose, capillary     Status: Abnormal   Collection Time: 04/27/21  7:13 PM  Result Value Ref Range   Glucose-Capillary 143 (H) 70 - 99 mg/dL    Comment: Glucose reference range applies only to samples taken after fasting for at least 8 hours.  Glucose, capillary     Status: Abnormal   Collection Time: 04/27/21  8:15 PM  Result Value Ref Range   Glucose-Capillary 104 (H) 70 - 99 mg/dL    Comment: Glucose reference range applies only to samples taken after fasting for at least 8 hours.  Glucose, capillary     Status: Abnormal   Collection Time: 04/27/21  9:02 PM  Result Value Ref Range   Glucose-Capillary 115 (H) 70 - 99 mg/dL    Comment: Glucose reference range applies only to samples taken after fasting for at least 8 hours.  Glucose, capillary     Status: Abnormal   Collection Time: 04/27/21 10:18 PM  Result Value Ref Range   Glucose-Capillary 132 (H) 70 - 99 mg/dL    Comment: Glucose reference range applies only to samples taken after fasting for at least 8 hours.  Glucose, capillary     Status: Abnormal   Collection Time: 04/27/21 11:04 PM  Result Value Ref Range   Glucose-Capillary 142 (H) 70 - 99 mg/dL    Comment: Glucose reference range applies only to samples taken after fasting for at least 8 hours.  Basic metabolic panel     Status: Abnormal   Collection Time: 04/27/21 11:12 PM  Result Value Ref Range   Sodium 141 135 - 145 mmol/L   Potassium 4.1 3.5 - 5.1 mmol/L   Chloride 109 98 - 111 mmol/L   CO2 25 22 - 32 mmol/L   Glucose, Bld 145 (H) 70 - 99 mg/dL    Comment: Glucose reference range applies only to samples taken after fasting for at least 8 hours.   BUN 12 6 - 20 mg/dL   Creatinine, Ser 1.18 0.61 - 1.24 mg/dL   Calcium 7.4 (L) 8.9 - 10.3 mg/dL    GFR, Estimated >60 >60 mL/min    Comment: (NOTE) Calculated using the CKD-EPI Creatinine Equation (2021)    Anion gap 7 5 - 15    Comment: Performed at [facility] [address] [place], [place] [address]  CBC     Status: Abnormal   Collection Time: 04/27/21 11:12 PM  Result Value Ref Range   WBC 7.6 4.0 - 10.5 K/uL   RBC 2.29 (L) 4.22 - 5.81 MIL/uL   Hemoglobin 7.0 (L) 13.0 - 17.0 g/dL   HCT 20.3 (L) 39.0 - 52.0 %   MCV 88.6 80.0 - 100.0 fL   MCH 30.6 26.0 - 34.0 pg   MCHC 34.5 30.0 - 36.0 g/dL   RDW 11.4 (L) 11.5 - 15.5 %   Platelets 64 (L) 150 - 400 K/uL    Comment: Immature Platelet Fraction may be clinically indicated, consider ordering this additional test [id number] CONSISTENT WITH PREVIOUS RESULT REPEATED TO VERIFY    nRBC 0.0 0.0 - 0.2 %    Comment: Performed at [facility], [place] [address], [place], [place] [address]  Magnesium     Status: Abnormal   Collection Time: 04/27/21 11:12 PM  Result Value Ref Range   Magnesium 2.8 (H) 1.7 - 2.4 mg/dL    Comment: Performed at [facility] [address] [place], [place] [address]  I-STAT 7, (LYTES, BLD GAS, ICA, H+H)     Status: Abnormal   Collection Time: 04/27/21 11:22 PM  Result Value Ref Range   pH, Arterial 7.386 7.350 - 7.450   pCO2 arterial 43.8 32.0 - 48.0 mmHg   pO2, Arterial 202 (H) 83.0 - 108.0 mmHg   Bicarbonate 26.3 20.0 - 28.0 mmol/L   TCO2 28 22 - 32 mmol/L   O2 Saturation 100.0 %   Acid-Base Excess 1.0 0.0 - 2.0 mmol/L   Sodium 146 (H) 135 - 145 mmol/L   Potassium 4.1 3.5 - 5.1 mmol/L   Calcium, Ion 1.05 (L) 1.15 - 1.40 mmol/L   HCT 20.0 (L) 39.0 - 52.0 %  Hemoglobin 6.8 (LL) 13.0 - 17.0 g/dL   Patient temperature 36.9 C    Sample type ARTERIAL    Comment NOTIFIED PHYSICIAN   Glucose, capillary     Status: Abnormal   Collection Time: 04/28/21 12:20 AM  Result Value Ref Range   Glucose-Capillary 118 (H) 70 - 99 mg/dL    Comment: Glucose reference range applies only to  samples taken after fasting for at least 8 hours.  I-STAT 7, (LYTES, BLD GAS, ICA, H+H)     Status: Abnormal   Collection Time: 04/28/21  1:43 AM  Result Value Ref Range   pH, Arterial 7.334 (L) 7.350 - 7.450   pCO2 arterial 47.0 32.0 - 48.0 mmHg   pO2, Arterial 92 83.0 - 108.0 mmHg   Bicarbonate 25.0 20.0 - 28.0 mmol/L   TCO2 26 22 - 32 mmol/L   O2 Saturation 96.0 %   Acid-base deficit 1.0 0.0 - 2.0 mmol/L   Sodium 146 (H) 135 - 145 mmol/L   Potassium 4.1 3.5 - 5.1 mmol/L   Calcium, Ion 1.14 (L) 1.15 - 1.40 mmol/L   HCT 22.0 (L) 39.0 - 52.0 %   Hemoglobin 7.5 (L) 13.0 - 17.0 g/dL   Patient temperature 37.0 C    Sample type ARTERIAL   Glucose, capillary     Status: Abnormal   Collection Time: 04/28/21  1:43 AM  Result Value Ref Range   Glucose-Capillary 106 (H) 70 - 99 mg/dL    Comment: Glucose reference range applies only to samples taken after fasting for at least 8 hours.  Glucose, capillary     Status: Abnormal   Collection Time: 04/28/21  3:13 AM  Result Value Ref Range   Glucose-Capillary 100 (H) 70 - 99 mg/dL    Comment: Glucose reference range applies only to samples taken after fasting for at least 8 hours.  CBC     Status: Abnormal   Collection Time: 04/28/21  4:44 AM  Result Value Ref Range   WBC 7.8 4.0 - 10.5 K/uL   RBC 2.50 (L) 4.22 - 5.81 MIL/uL   Hemoglobin 7.6 (L) 13.0 - 17.0 g/dL   HCT 22.8 (L) 39.0 - 52.0 %   MCV 91.2 80.0 - 100.0 fL   MCH 30.4 26.0 - 34.0 pg   MCHC 33.3 30.0 - 36.0 g/dL   RDW 11.6 11.5 - 15.5 %   Platelets 70 (L) 150 - 400 K/uL    Comment: Immature Platelet Fraction may be clinically indicated, consider ordering this additional test [id number] CONSISTENT WITH PREVIOUS RESULT REPEATED TO VERIFY    nRBC 0.0 0.0 - 0.2 %    Comment: Performed at [facility], [place] [address], [place], [place] [address]  Basic metabolic panel     Status: Abnormal   Collection Time: 04/28/21  4:44 AM  Result Value Ref Range   Sodium 145 135 - 145  mmol/L   Potassium 4.2 3.5 - 5.1 mmol/L   Chloride 110 98 - 111 mmol/L   CO2 26 22 - 32 mmol/L   Glucose, Bld 100 (H) 70 - 99 mg/dL    Comment: Glucose reference range applies only to samples taken after fasting for at least 8 hours.   BUN 11 6 - 20 mg/dL   Creatinine, Ser 1.26 (H) 0.61 - 1.24 mg/dL   Calcium 7.7 (L) 8.9 - 10.3 mg/dL   GFR, Estimated >60 >60 mL/min    Comment: (NOTE) Calculated using the CKD-EPI Creatinine Equation (2021)  Anion gap 9 5 - 15    Comment: Performed at [facility] [address], [place], [place] [address]  Magnesium     Status: Abnormal   Collection Time: 04/28/21  4:44 AM  Result Value Ref Range   Magnesium 2.7 (H) 1.7 - 2.4 mg/dL    Comment: Performed at [facility] [address], [place], [place] [address]  I-STAT 7, (LYTES, BLD GAS, ICA, H+H)     Status: Abnormal   Collection Time: 04/28/21  5:00 AM  Result Value Ref Range   pH, Arterial 7.334 (L) 7.350 - 7.450   pCO2 arterial 48.6 (H) 32.0 - 48.0 mmHg   pO2, Arterial 111 (H) 83.0 - 108.0 mmHg   Bicarbonate 25.9 20.0 - 28.0 mmol/L   TCO2 27 22 - 32 mmol/L   O2 Saturation 98.0 %   Acid-Base Excess 0.0 0.0 - 2.0 mmol/L   Sodium 146 (H) 135 - 145 mmol/L   Potassium 4.2 3.5 - 5.1 mmol/L   Calcium, Ion 1.14 (L) 1.15 - 1.40 mmol/L   HCT 23.0 (L) 39.0 - 52.0 %   Hemoglobin 7.8 (L) 13.0 - 17.0 g/dL   Sample type ARTERIAL   Glucose, capillary     Status: None   Collection Time: 04/28/21  5:01 AM  Result Value Ref Range   Glucose-Capillary 98 70 - 99 mg/dL    Comment: Glucose reference range applies only to samples taken after fasting for at least 8 hours.  Glucose, capillary     Status: Abnormal   Collection Time: 04/28/21  7:32 AM  Result Value Ref Range   Glucose-Capillary 133 (H) 70 - 99 mg/dL    Comment: Glucose reference range applies only to samples taken after fasting for at least 8 hours.  Glucose, capillary     Status: Abnormal   Collection Time: 04/28/21 11:24 AM   Result Value Ref Range   Glucose-Capillary 103 (H) 70 - 99 mg/dL    Comment: Glucose reference range applies only to samples taken after fasting for at least 8 hours.  Glucose, capillary     Status: None   Collection Time: 04/28/21  3:35 PM  Result Value Ref Range   Glucose-Capillary 98 70 - 99 mg/dL    Comment: Glucose reference range applies only to samples taken after fasting for at least 8 hours.  Basic metabolic panel     Status: Abnormal   Collection Time: 04/28/21  5:07 PM  Result Value Ref Range   Sodium 139 135 - 145 mmol/L    Comment: DELTA CHECK NOTED   Potassium 3.9 3.5 - 5.1 mmol/L   Chloride 104 98 - 111 mmol/L   CO2 27 22 - 32 mmol/L   Glucose, Bld 141 (H) 70 - 99 mg/dL    Comment: Glucose reference range applies only to samples taken after fasting for at least 8 hours.   BUN 11 6 - 20 mg/dL   Creatinine, Ser 1.27 (H) 0.61 - 1.24 mg/dL   Calcium 8.1 (L) 8.9 - 10.3 mg/dL   GFR, Estimated >60 >60 mL/min    Comment: (NOTE) Calculated using the CKD-EPI Creatinine Equation (2021)    Anion gap 8 5 - 15    Comment: Performed at [facility] [address] [place], [place] [address]  Magnesium     Status: None   Collection Time: 04/28/21  5:07 PM  Result Value Ref Range   Magnesium 2.4 1.7 - 2.4 mg/dL  Comment: Performed at [facility], [place] [address], [place], [place] [address]  CBC     Status: Abnormal   Collection Time: 04/28/21  5:07 PM  Result Value Ref Range   WBC 14.5 (H) 4.0 - 10.5 K/uL   RBC 2.67 (L) 4.22 - 5.81 MIL/uL   Hemoglobin 8.1 (L) 13.0 - 17.0 g/dL   HCT 24.5 (L) 39.0 - 52.0 %   MCV 91.8 80.0 - 100.0 fL   MCH 30.3 26.0 - 34.0 pg   MCHC 33.1 30.0 - 36.0 g/dL   RDW 11.8 11.5 - 15.5 %   Platelets 94 (L) 150 - 400 K/uL    Comment: Immature Platelet Fraction may be clinically indicated, consider ordering this additional test [id number] CONSISTENT WITH PREVIOUS RESULT REPEATED TO VERIFY    nRBC 0.0 0.0 - 0.2 %    Comment:  Performed at [facility], [place] [address] [place], [place] [address]  Glucose, capillary     Status: Abnormal   Collection Time: 04/28/21  7:32 PM  Result Value Ref Range   Glucose-Capillary 116 (H) 70 - 99 mg/dL    Comment: Glucose reference range applies only to samples taken after fasting for at least 8 hours.  Glucose, capillary     Status: Abnormal   Collection Time: 04/29/21 12:26 AM  Result Value Ref Range   Glucose-Capillary 103 (H) 70 - 99 mg/dL    Comment: Glucose reference range applies only to samples taken after fasting for at least 8 hours.  Basic metabolic panel     Status: Abnormal   Collection Time: 04/29/21  3:14 AM  Result Value Ref Range   Sodium 136 135 - 145 mmol/L   Potassium 3.8 3.5 - 5.1 mmol/L   Chloride 100 98 - 111 mmol/L   CO2 29 22 - 32 mmol/L   Glucose, Bld 118 (H) 70 - 99 mg/dL    Comment: Glucose reference range applies only to samples taken after fasting for at least 8 hours.   BUN 10 6 - 20 mg/dL   Creatinine, Ser 1.14 0.61 - 1.24 mg/dL   Calcium 8.2 (L) 8.9 - 10.3 mg/dL   GFR, Estimated >60 >60 mL/min    Comment: (NOTE) Calculated using the CKD-EPI Creatinine Equation (2021)    Anion gap 7 5 - 15    Comment: Performed at [facility] [address], [place], [place] [address]  CBC     Status: Abnormal   Collection Time: 04/29/21  3:14 AM  Result Value Ref Range   WBC 13.3 (H) 4.0 - 10.5 K/uL   RBC 2.61 (L) 4.22 - 5.81 MIL/uL   Hemoglobin 7.9 (L) 13.0 - 17.0 g/dL   HCT 24.1 (L) 39.0 - 52.0 %   MCV 92.3 80.0 - 100.0 fL   MCH 30.3 26.0 - 34.0 pg   MCHC 32.8 30.0 - 36.0 g/dL   RDW 11.7 11.5 - 15.5 %   Platelets 89 (L) 150 - 400 K/uL    Comment: Immature Platelet Fraction may be clinically indicated, consider ordering this additional test [id number] CONSISTENT WITH PREVIOUS RESULT REPEATED TO VERIFY    nRBC 0.0 0.0 - 0.2 %    Comment: Performed at [facility], [place] [address], [place], [place] [id number]  Protime-INR      Status: Abnormal   Collection Time: 04/29/21  3:14 AM  Result Value Ref Range   Prothrombin Time 20.6 (H) 11.4 - 15.2 seconds   INR  1.8 (H) 0.8 - 1.2    Comment: (NOTE) INR goal varies based on device and disease states. Performed at [facility], [place] [address], [place], [place] [address]   Glucose, capillary     Status: Abnormal   Collection Time: 04/29/21  3:18 AM  Result Value Ref Range   Glucose-Capillary 106 (H) 70 - 99 mg/dL    Comment: Glucose reference range applies only to samples taken after fasting for at least 8 hours.  Glucose, capillary     Status: None   Collection Time: 04/29/21  8:27 AM  Result Value Ref Range   Glucose-Capillary 96 70 - 99 mg/dL    Comment: Glucose reference range applies only to samples taken after fasting for at least 8 hours.  Glucose, capillary     Status: None   Collection Time: 04/29/21 11:43 AM  Result Value Ref Range   Glucose-Capillary 93 70 - 99 mg/dL    Comment: Glucose reference range applies only to samples taken after fasting for at least 8 hours.  Glucose, capillary     Status: None   Collection Time: 04/29/21  5:06 PM  Result Value Ref Range   Glucose-Capillary 79 70 - 99 mg/dL    Comment: Glucose reference range applies only to samples taken after fasting for at least 8 hours.   Comment 1 Notify RN    Comment 2 Document in Chart   Glucose, capillary     Status: Abnormal   Collection Time: 04/29/21  9:10 PM  Result Value Ref Range   Glucose-Capillary 108 (H) 70 - 99 mg/dL    Comment: Glucose reference range applies only to samples taken after fasting for at least 8 hours.   Comment 1 Notify RN    Comment 2 Document in Chart   Protime-INR     Status: Abnormal   Collection Time: 04/30/21  2:07 AM  Result Value Ref Range   Prothrombin Time 17.6 (H) 11.4 - 15.2 seconds   INR 1.5 (H) 0.8 - 1.2    Comment: (NOTE) INR goal varies based on device and disease states. Performed at [facility], [place] [address],  [place], [place] [address]   CBC     Status: Abnormal   Collection Time: 04/30/21  2:07 AM  Result Value Ref Range   WBC 11.7 (H) 4.0 - 10.5 K/uL   RBC 2.41 (L) 4.22 - 5.81 MIL/uL   Hemoglobin 7.1 (L) 13.0 - 17.0 g/dL   HCT 22.0 (L) 39.0 - 52.0 %   MCV 91.3 80.0 - 100.0 fL   MCH 29.5 26.0 - 34.0 pg   MCHC 32.3 30.0 - 36.0 g/dL   RDW 11.7 11.5 - 15.5 %   Platelets 100 (L) 150 - 400 K/uL    Comment: Immature Platelet Fraction may be clinically indicated, consider ordering this additional test [id number] CONSISTENT WITH PREVIOUS RESULT REPEATED TO VERIFY    nRBC 0.2 0.0 - 0.2 %    Comment: Performed at [facility], [place] [address], [place], [place] [address]  Basic metabolic panel     Status: Abnormal   Collection Time: 04/30/21  2:07 AM  Result Value Ref Range   Sodium 135 135 - 145 mmol/L   Potassium 4.2 3.5 - 5.1 mmol/L   Chloride 99 98 - 111 mmol/L   CO2 28 22 - 32 mmol/L   Glucose, Bld 100 (H) 70 - 99 mg/dL    Comment: Glucose reference range applies only to  samples taken after fasting for at least 8 hours.   BUN 12 6 - 20 mg/dL   Creatinine, Ser 1.13 0.61 - 1.24 mg/dL   Calcium 8.5 (L) 8.9 - 10.3 mg/dL   GFR, Estimated >60 >60 mL/min    Comment: (NOTE) Calculated using the CKD-EPI Creatinine Equation (2021)    Anion gap 8 5 - 15    Comment: Performed at [facility] [address], [place], [place] [address]  Heparin level (unfractionated)     Status: Abnormal   Collection Time: 04/30/21  2:07 AM  Result Value Ref Range   Heparin Unfractionated <0.10 (L) 0.30 - 0.70 IU/mL    Comment: (NOTE) The clinical reportable range upper limit is being lowered to >1.10 to align with the FDA approved guidance for the current laboratory assay.  If heparin results are below expected values, and patient dosage has  been confirmed, suggest follow up testing of antithrombin III levels. Performed at [facility], [place] [address], [place], [place] [address]   Glucose,  capillary     Status: None   Collection Time: 04/30/21  6:05 AM  Result Value Ref Range   Glucose-Capillary 81 70 - 99 mg/dL    Comment: Glucose reference range applies only to samples taken after fasting for at least 8 hours.   Comment 1 Notify RN    Comment 2 Document in Chart   Glucose, capillary     Status: None   Collection Time: 04/30/21 12:23 PM  Result Value Ref Range   Glucose-Capillary 79 70 - 99 mg/dL    Comment: Glucose reference range applies only to samples taken after fasting for at least 8 hours.   Comment 1 Notify RN    Comment 2 Document in Chart   Glucose, capillary     Status: None   Collection Time: 04/30/21  5:06 PM  Result Value Ref Range   Glucose-Capillary 76 70 - 99 mg/dL    Comment: Glucose reference range applies only to samples taken after fasting for at least 8 hours.   Comment 1 Notify RN    Comment 2 Document in Chart   Protime-INR     Status: Abnormal   Collection Time: 05/01/21  2:27 AM  Result Value Ref Range   Prothrombin Time 16.4 (H) 11.4 - 15.2 seconds   INR 1.3 (H) 0.8 - 1.2    Comment: (NOTE) INR goal varies based on device and disease states. Performed at [facility], [place] [address], [place], [place] [address]   Basic metabolic panel     Status: Abnormal   Collection Time: 05/01/21  2:27 AM  Result Value Ref Range   Sodium 138 135 - 145 mmol/L   Potassium 3.5 3.5 - 5.1 mmol/L   Chloride 100 98 - 111 mmol/L   CO2 29 22 - 32 mmol/L   Glucose, Bld 86 70 - 99 mg/dL    Comment: Glucose reference range applies only to samples taken after fasting for at least 8 hours.   BUN 16 6 - 20 mg/dL   Creatinine, Ser 1.28 (H) 0.61 - 1.24 mg/dL   Calcium 8.7 (L) 8.9 - 10.3 mg/dL   GFR, Estimated >60 >60 mL/min    Comment: (NOTE) Calculated using the CKD-EPI Creatinine Equation (2021)    Anion gap 9 5 - 15    Comment: Performed at [facility] [address] [place], [place] [address]  CBC     Status: Abnormal  Collection Time:  05/01/21  2:27 AM  Result Value Ref Range   WBC 10.0 4.0 - 10.5 K/uL   RBC 2.54 (L) 4.22 - 5.81 MIL/uL   Hemoglobin 7.5 (L) 13.0 - 17.0 g/dL   HCT 22.8 (L) 39.0 - 52.0 %   MCV 89.8 80.0 - 100.0 fL   MCH 29.5 26.0 - 34.0 pg   MCHC 32.9 30.0 - 36.0 g/dL   RDW 11.7 11.5 - 15.5 %   Platelets 158 150 - 400 K/uL   nRBC 0.5 (H) 0.0 - 0.2 %    Comment: Performed at [facility], [place] [address] [place], [place] [address]  Glucose, capillary     Status: None   Collection Time: 05/01/21  6:23 AM  Result Value Ref Range   Glucose-Capillary 94 70 - 99 mg/dL    Comment: Glucose reference range applies only to samples taken after fasting for at least 8 hours.   Comment 1 Notify RN    Comment 2 Document in Chart   Protime-INR     Status: Abnormal   Collection Time: 05/02/21  6:32 AM  Result Value Ref Range   Prothrombin Time 16.2 (H) 11.4 - 15.2 seconds   INR 1.3 (H) 0.8 - 1.2    Comment: (NOTE) INR goal varies based on device and disease states. Performed at [facility], [place] [address], [place], [place] [address]   Basic metabolic panel     Status: Abnormal   Collection Time: 05/02/21  6:32 AM  Result Value Ref Range   Sodium 138 135 - 145 mmol/L   Potassium 3.6 3.5 - 5.1 mmol/L   Chloride 102 98 - 111 mmol/L   CO2 29 22 - 32 mmol/L   Glucose, Bld 99 70 - 99 mg/dL    Comment: Glucose reference range applies only to samples taken after fasting for at least 8 hours.   BUN 16 6 - 20 mg/dL   Creatinine, Ser 1.28 (H) 0.61 - 1.24 mg/dL   Calcium 8.7 (L) 8.9 - 10.3 mg/dL   GFR, Estimated >60 >60 mL/min    Comment: (NOTE) Calculated using the CKD-EPI Creatinine Equation (2021)    Anion gap 7 5 - 15    Comment: Performed at [facility] [address], [place], [place] [address]  Protime-INR     Status: Abnormal   Collection Time: 05/03/21  2:02 AM  Result Value Ref Range   Prothrombin Time 21.2 (H) 11.4 - 15.2 seconds   INR 1.8 (H) 0.8 - 1.2    Comment: (NOTE) INR  goal varies based on device and disease states. Performed at [facility], [place] [address] [place], [place] [address]   POCT INR     Status: Abnormal   Collection Time: 05/05/21  2:42 PM  Result Value Ref Range   INR 1.9 (A) 2.0 - 3.0  CUP PACEART REMOTE DEVICE CHECK     Status: None   Collection Time: 05/11/21  4:58 AM  Result Value Ref Range   Date Time Interrogation Session [id number]    Pulse Generator Manufacturer BOST    Pulse Gen Model E141 [facility] VR    Pulse Gen Serial Number [id number]    Clinic Name [facility] Pulse Generator Type Implantable Cardiac Defibulator    Implantable Pulse Generator Implant Date 62947654    Implantable Lead Manufacturer GUIC    Implantable Lead Model 0185 [employer] Reliance G    Implantable  Lead Serial Number [id number]    Implantable Lead Implant Date 62229798    Implantable Lead Location Detail 1 APEX    Implantable Lead Location [id number]    Lead Channel Setting Sensing Sensitivity 0.6 mV   Lead Channel Setting Sensing Adaptation Mode Adaptive Sensing    Lead Channel Setting Pacing Pulse Width 0.4 ms   Lead Channel Setting Pacing Amplitude 2.0 V   Lead Channel Impedance Value 534 ohm   Lead Channel Pacing Threshold Amplitude 1.0 V   Lead Channel Pacing Threshold Pulse Width 0.4 ms   HighPow Impedance 34 ohm   Battery Status BOS    Battery Remaining Longevity 66 mo   Battery Remaining Percentage 66 %   Brady Statistic RV Percent Paced 0 %   Eval Rhythm SR at 81 bpm   POCT INR     Status: Abnormal   Collection Time: 05/12/21  2:14 PM  Result Value Ref Range   INR 1.8 (A) 2.0 - 3.0  POCT INR     Status: Abnormal   Collection Time: 05/19/21  3:11 PM  Result Value Ref Range   INR 1.7 (A) 2.0 - 3.0  POCT INR     Status: Abnormal   Collection Time: 05/26/21  3:21 PM  Result Value Ref Range   INR 1.5 (A) 2.0 - 3.0  POCT INR     Status: None   Collection Time: 06/02/21  3:47 PM  Result Value Ref Range   INR 2.1 2.0 -  3.0  CUP PACEART INCLINIC DEVICE CHECK     Status: None   Collection Time: 06/06/21 12:00 AM  Result Value Ref Range   Date Time Interrogation Session [id number]    Pulse Generator Manufacturer BOST    Pulse Gen Model E141 ENERGEN VR    Pulse Gen Serial Number [id number]    Clinic Name [employer]    Implantable Pulse Generator Type Implantable Cardiac Defibulator    Implantable Pulse Generator Implant Date [id number]    Implantable Lead Manufacturer GUIC    Implantable Lead Model 0185 [employer] Reliance G    Implantable Lead Serial Number [id number]    Implantable Lead Implant Date 14970263    Implantable Lead Location Detail 1 APEX    Implantable Lead Location [id number]    Lead Channel Setting Sensing Sensitivity 0.6 mV   Lead Channel Setting Sensing Adaptation Mode Adaptive Sensing    Lead Channel Setting Pacing Pulse Width 0.4 ms   Lead Channel Setting Pacing Amplitude 2.0 V   Lead Channel Impedance Value 566.0 ohm   Lead Channel Sensing Intrinsic Amplitude 10.3 mV   Lead Channel Pacing Threshold Amplitude 0.9000 V   Lead Channel Pacing Threshold Pulse Width 0.4 ms   HighPow Impedance 45.0 ohm   HighPow Impedance 38.0 ohm   Battery Status BOS    Eval Rhythm VS   ECHOCARDIOGRAM COMPLETE     Status: None   Collection Time: 06/07/21 10:44 AM  Result Value Ref Range   Area-P 1/2 4.96 cm2   S' Lateral 5.30 cm   AV Area mean vel 1.28 cm2   AR max vel 1.35 cm2   AV Area VTI 1.68 cm2   Ao pk vel 1.85 m/s   AV Mean grad 8.0 mmHg   AV Peak grad 13.7 mmHg  POCT INR     Status: None   Collection Time: 06/09/21  9:41 AM  Result Value Ref Range   INR 2.4 2.0 - 3.0  Basic metabolic panel  Status: Abnormal   Collection Time: 06/19/21  8:32 AM  Result Value Ref Range   Sodium 133 (L) 135 - 145 mmol/L   Potassium 4.0 3.5 - 5.1 mmol/L   Chloride 105 98 - 111 mmol/L   CO2 23 22 - 32 mmol/L   Glucose, Bld 104 (H) 70 - 99 mg/dL    Comment: Glucose reference range applies only to  samples taken after fasting for at least 8 hours.   BUN 15 6 - 20 mg/dL   Creatinine, Ser 1.17 0.61 - 1.24 mg/dL   Calcium 8.9 8.9 - 10.3 mg/dL   GFR, Estimated >60 >60 mL/min    Comment: (NOTE) Calculated using the CKD-EPI Creatinine Equation (2021)    Anion gap 5 5 - 15    Comment: Performed at [facility], [place] [place], [place] [address]  POCT INR     Status: None   Collection Time: 06/20/21  3:39 PM  Result Value Ref Range   INR 2.5 2.0 - 3.0     Fall Risk: Fall Risk  07/11/2021 05/25/2021 02/25/2020 02/17/2020 05/04/2019  Falls in the past year? 0 0 - 0 0  Number falls in past yr: 0 0 0 0 0  Injury with Fall? 0 0 0 0 0  Risk for fall due to : - No Fall Risks - - -  Follow up - Falls evaluation completed;Education provided;Falls prevention discussed - Falls evaluation completed Falls evaluation completed     Functional Status Survey: Is the patient deaf or have difficulty hearing?: Yes Does the patient have difficulty seeing, even when wearing glasses/contacts?: Yes Does the patient have difficulty concentrating, remembering, or making decisions?: No Does the patient have difficulty walking or climbing stairs?: No Does the patient have difficulty dressing or bathing?: No Does the patient have difficulty doing errands alone such as visiting a doctor's office or shopping?: No    Assessment & Plan  1. Routine general medical examination at a health care facility: Screening labs today.   - CBC w/Diff/Platelet - COMPLETE METABOLIC PANEL WITH GFR - Lipid Profile - PSA   -Prostate cancer screening and PSA options (with potential risks and benefits of testing vs not testing) were discussed along with recent recs/guidelines. -USPSTF grade A and B recommendations reviewed with patient; age-appropriate recommendations, preventive care, screening tests, etc discussed and encouraged; healthy living encouraged; see AVS for patient education given to  patient -Discussed importance of 150 minutes of physical activity weekly, eat two servings of fish weekly, eat one serving of tree nuts ( cashews, pistachios, pecans, almonds.Marland Kitchen) every other day, eat 6 servings of fruit/vegetables daily and drink plenty of water and avoid sweet beverages.  -Reviewed Health Maintenance: yes

## 2021-07-11 NOTE — Progress Notes (Signed)
Patient Care Team: Delsa Grana, PA-C as PCP - General (Family Medicine) Sherren Mocha, MD as PCP - Cardiology (Cardiology) Deboraha Sprang, MD as PCP - Electrophysiology (Cardiology) Arnetha Courser, MD as Attending Physician (Family Medicine) Deboraha Sprang, MD as Consulting Physician (Cardiology)   HPI  [name] is a 46 y.o. male Seen in followup for ventricular tachycardia occurring in the context of surgically corrected congenital heart disease With Aortic valve replacement.  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  My note from 2011 reports that at the time of surgery "he had damage to his right coronary artery and underwent repair." He is status post ICD implantation. Device generator replacement 12/14.  Underwent open aortic valve replacement 12/22 ([facility]); discussions prior to that as to whether he should have VT mapping at the same time, these happened for weeks prior to his surgery but ultimately Came to final resolution the week of.  Quite frustrating for the patient understandably  Overall better.  Less dyspnea.  Has noted his heart rate has been faster.  Following discharge, his afterload therapies were discontinued.  Gradually resumed.  Recently down titrated by [facility] because of lightheadedness and fatigue.  Now better.  No chest pain or edema  4/13 had appropriate shock for fast ventricular tachycardia He underwent   12/17  Admitted [facility] for syncopal VT- PM  Antecedent VT PM in wake of viral illness  Ranexa and MAG added He has had interval VTNS monomorphic and polymorphic  2/20 --abrupt onset of presyncope prompting him to sit on the floor and about 15 seconds the episode had resolved.  Device interrogation demonstrates rapid monomorphic VT with acceleration with ATP and then termination spontaneously at the moment of charging being  completed.  The shock was diverted.    Intercurrent AFib-paroxysmal   Referred 2/21 to [facility] for consideration of VT ablation -briefly summarized as normal endocardial voltage.  Thought to be epicardial.  Mapping of the CS was complicated by dissection.  VTs were hemodynamically unstable and " Given the inability to activation map due to hemodynamically unstable VT and inability to access the epicardial space, the case was concluded at this time."      DATE TEST EF   3/13    Echo 40-45 % IP akinesis   1/15    TEE  60 % "mechanical aortic valve"  2/17 Echo 45-50%   12/17 Echo  35-40% "bioprosthesis "  5/17 Echo  30-35%   8/18 CT 30-35% RCA Total without flow//aneurysm Inferior Wall LAD & CX w FFR > 80%  9/21 Echo  35-40% Bioprosthesis -- AoV mean grad 33  1/23 Echo  30-35% AoV mean grad 8 mm       Antiarrhythmics Date Reason stopped  Ranolazine 12/17     Date Cr K Hgb  12/18 1.24 4.3    11/19 1.14 3.8   9/20 1.17 4.5   9/21 1.25 4.8 14  1/23 1.17 4.0 7.5      DATE TEST    6/18    HOLTER <1 % PVCs              Past Medical History:  Diagnosis Date   AICD (automatic cardioverter/defibrillator) present    Anxiety    Aortic valve replaced    Requiring replacement, specifics not available   Complication of anesthesia  N & V   Dysrhythmia    Hearing loss of both ears 12/22/2015   Heart murmur    Hypertension    IBD (inflammatory bowel disease) 09/23/2015   ICD (implantable cardiac defibrillator), BSX single    Myocardial infarction [facility])    Obesity 12/22/2015   PONV (postoperative nausea and vomiting)    Secondary cardiomyopathy ([facility])    Syncope    Ventricular septal defect    Ventricular tachycardia    appropriate VT shock therapy /13    Past Surgical History:  Procedure Laterality Date   ANGIOPLASTY     RCA repair with vein angioplasty following injury with the aforementioned surgery   AORTIC VALVE REPLACEMENT     Bioprosthesis   AORTIC  VALVE REPLACEMENT N/A 04/27/2021   Procedure: REDO AORTIC VALVE REPLACEMENT (AVR) USING ON-X 23MM AORTIC VALVE;  Surgeon: Gaye Pollack, MD;  Location: [facility];  Service: Open Heart Surgery;  Laterality: N/A;   CARDIAC CATHETERIZATION     CARDIAC DEFIBRILLATOR PLACEMENT     CARDIAC VALVE REPLACEMENT     CHOLECYSTECTOMY     COLONOSCOPY WITH PROPOFOL N/A 12/06/2015   Procedure: COLONOSCOPY WITH PROPOFOL;  Surgeon: [name], MD;  Location: [facility] ENDOSCOPY;  Service: Endoscopy;  Laterality: N/A;   CORONARY ANGIOPLASTY     CORONARY ARTERY BYPASS GRAFT     IMPLANTABLE CARDIOVERTER DEFIBRILLATOR GENERATOR CHANGE N/A 05/05/2013   Procedure: IMPLANTABLE CARDIOVERTER DEFIBRILLATOR GENERATOR CHANGE;  Surgeon: Deboraha Sprang, MD;  Location: [facility] CATH LAB;  Service: Cardiovascular;  Laterality: N/A;   INSERT / REPLACE / REMOVE PACEMAKER     RIGHT/LEFT HEART CATH AND CORONARY ANGIOGRAPHY N/A 03/01/2021   Procedure: RIGHT/LEFT HEART CATH AND CORONARY ANGIOGRAPHY;  Surgeon: Sherren Mocha, MD;  Location: [facility] CV LAB;  Service: Cardiovascular;  Laterality: N/A;   TEE WITHOUT CARDIOVERSION N/A 04/27/2021   Procedure: TRANSESOPHAGEAL ECHOCARDIOGRAM (TEE);  Surgeon: Gaye Pollack, MD;  Location: [facility];  Service: Open Heart Surgery;  Laterality: N/A;   VSD REPAIR      Current Outpatient Medications  Medication Sig Dispense Refill   acetaminophen (TYLENOL) 500 MG tablet Take 500 mg by mouth every 6 (six) hours as needed for moderate pain.     aspirin EC 81 MG EC tablet Take 1 tablet (81 mg total) by mouth daily. Swallow whole. 30 tablet 11   carvedilol (COREG) 6.25 MG tablet Take 1 tablet (6.25 mg total) by mouth 2 (two) times daily. 180 tablet 3   fluticasone (FLONASE) 50 MCG/ACT nasal spray Place 2 sprays into both nostrils daily as needed for allergies.     fluticasone (FLONASE) 50 MCG/ACT nasal spray Place into the nose.     loratadine (CLARITIN) 10 MG tablet Take by mouth as needed.     magnesium  oxide (MAG-OX) 400 MG tablet Take 1 tablet (400 mg total) by mouth 2 (two) times daily. 180 tablet 3   meclizine (ANTIVERT) 25 MG tablet Take 0.5-1 tablets (12.5-25 mg total) by mouth 3 (three) times daily as needed for dizziness. 30 tablet 2   Multiple Vitamin (MULTIVITAMIN) tablet Take 2 tablets by mouth daily.     ondansetron (ZOFRAN-ODT) 4 MG disintegrating tablet Take by mouth as needed.     oxyCODONE (OXY IR/ROXICODONE) 5 MG immediate release tablet Take 1 tablet (5 mg total) by mouth every 4 (four) hours as needed for severe pain. 30 tablet 0   potassium chloride SA (KLOR-CON M) 20 MEQ tablet Take 1 tablet (20 mEq total) by  mouth daily. For 4 days then stop. 4 tablet 0   ranolazine (RANEXA) 1000 MG SR tablet Take 1 tablet (1,000 mg total) by mouth 2 (two) times daily. 180 tablet 3   sacubitril-valsartan (ENTRESTO) 24-26 MG Take 1 tablet by mouth 2 (two) times daily. 180 tablet 3   spironolactone (ALDACTONE) 25 MG tablet Take 0.5 tablets (12.5 mg total) by mouth daily. 45 tablet 3   torsemide (DEMADEX) 20 MG tablet Take 1 tablet (20 mg total) by mouth daily. For 4 days then stop. 4 tablet 0   triamcinolone cream (KENALOG) 0.1 % Apply 1 application topically 2 (two) times daily as needed. 30 g 0   triamcinolone cream (KENALOG) 0.1 % Apply topically as needed.     umeclidinium bromide (INCRUSE ELLIPTA) 62.5 MCG/ACT AEPB Inhale 1 puff into the lungs as needed.     warfarin (COUMADIN) 5 MG tablet TAKE 1 AND 1/2 TABLETS BY MOUTH DAILY OR AS DIRECTED BY THE COUMADIN CLINIC 50 tablet 3   No current facility-administered medications for this visit.    Allergies  Allergen Reactions   Shellfish Allergy Nausea And Vomiting    Review of Systems negative except from HPI and PMH  Physical Exam BP 106/72 (BP Location: Left Arm, Patient Position: Sitting, Cuff Size: Normal)    Pulse 81    Ht 5\' 10"  (1.778 m)    Wt 199 lb (90.3 kg)    SpO2 98%    BMI 28.55 kg/m  Well developed and well nourished in  no acute distress HENT normal Neck supple with JVP-flat Clear Device pocket well healed; without hematoma or erythema.  There is no tethering  Regular rate and rhythm, no  gallop 2/6 murmur mechanical S2 Abd-soft with active BS No Clubbing cyanosis   edema Skin-warm and dry A & Oriented  Grossly normal sensory and motor function  ECG sinus at 81 Intervals 18/13/40 Nonspecific IVCD   Assessment and plan  Ventricular tachycardia-recurrent  Hypertension   Aortic valve-mechanical 12/22   ICD-[employer]    Cardiomyopathy-hypertrophic/ischemic-secondary  PVCs-infrequent  RCA No flow, presumed 2/2 surgery As above     No interval ventricular tachycardia.  Continue the ranolazine at 1000 mg twice daily and his carvedilol at 6.25 twice daily   Heart rate is rapid.  This may be related to surgical partial denervation given his complexities of his third procedure; might also be related to anemia, he will check his hemoglobin today with his PCP.  Could also be the lower dose of his carvedilol and would gradually increase this as his blood pressure allows.  Device function is normal.  No bleeding on his anticoagulation, continue warfarin and aspirin 81.

## 2021-07-11 NOTE — Patient Instructions (Addendum)
It was great seeing you today!  Plan discussed at today's visit: -Blood work ordered today, results will be uploaded to [facility].   Follow up in: 3-6 months   Take care and let us know if you have any questions or concerns prior to your next visit.  Dr. Rosana Berger  Health Maintenance, Male Adopting a healthy lifestyle and getting preventive care are important in promoting health and wellness. Ask your health care provider about: The right schedule for you to have regular tests and exams. Things you can do on your own to prevent diseases and keep yourself healthy. What should I know about diet, weight, and exercise? Eat a healthy diet  Eat a diet that includes plenty of vegetables, fruits, low-fat dairy products, and lean protein. Do not eat a lot of foods that are high in solid fats, added sugars, or sodium. Maintain a healthy weight Body mass index (BMI) is a measurement that can be used to identify possible weight problems. It estimates body fat based on height and weight. Your health care provider can help determine your BMI and help you achieve or maintain a healthy weight. Get regular exercise Get regular exercise. This is one of the most important things you can do for your health. Most adults should: Exercise for at least 150 minutes each week. The exercise should increase your heart rate and make you sweat (moderate-intensity exercise). Do strengthening exercises at least twice a week. This is in addition to the moderate-intensity exercise. Spend less time sitting. Even light physical activity can be beneficial. Watch cholesterol and blood lipids Have your blood tested for lipids and cholesterol at 48 years of age, then have this test every 5 years. You may need to have your cholesterol levels checked more often if: Your lipid or cholesterol levels are high. You are older than 48 years of age. You are at high risk for heart disease. What should I know about cancer  screening? Many types of cancers can be detected early and may often be prevented. Depending on your health history and family history, you may need to have cancer screening at various ages. This may include screening for: Colorectal cancer. Prostate cancer. Skin cancer. Lung cancer. What should I know about heart disease, diabetes, and high blood pressure? Blood pressure and heart disease High blood pressure causes heart disease and increases the risk of stroke. This is more likely to develop in people who have high blood pressure readings or are overweight. Talk with your health care provider about your target blood pressure readings. Have your blood pressure checked: Every 3-5 years if you are 62-47 years of age. Every year if you are 65 years old or older. If you are between the ages of 41 and 67 and are a current or former smoker, ask your health care provider if you should have a one-time screening for abdominal aortic aneurysm (AAA). Diabetes Have regular diabetes screenings. This checks your fasting blood sugar level. Have the screening done: Once every three years after age 96 if you are at a normal weight and have a low risk for diabetes. More often and at a younger age if you are overweight or have a high risk for diabetes. What should I know about preventing infection? Hepatitis B If you have a higher risk for hepatitis B, you should be screened for this virus. Talk with your health care provider to find out if you are at risk for hepatitis B infection. Hepatitis C Blood testing is recommended for:  Everyone born from 1945 through 1965. °Anyone with known risk factors for hepatitis C. °Sexually transmitted infections (STIs) °You should be screened each year for STIs, including gonorrhea and chlamydia, if: °You are sexually active and are younger than 48 years of age. °You are older than 48 years of age and your health care provider tells you that you are at risk for this type of  infection. °Your sexual activity has changed since you were last screened, and you are at increased risk for chlamydia or gonorrhea. Ask your health care provider if you are at risk. °Ask your health care provider about whether you are at high risk for HIV. Your health care provider may recommend a prescription medicine to help prevent HIV infection. If you choose to take medicine to prevent HIV, you should first get tested for HIV. You should then be tested every 3 months for as long as you are taking the medicine. °Follow these instructions at home: °Alcohol use °Do not drink alcohol if your health care provider tells you not to drink. °If you drink alcohol: °Limit how much you have to 0-2 drinks a day. °Know how much alcohol is in your drink. In the [place] one drink equals one 12 oz bottle of beer (355 mL), one 5 oz glass of wine (148 mL), or one 1½ oz glass of hard liquor (44 mL). °Lifestyle °Do not use any products that contain nicotine or tobacco. These products include cigarettes, chewing tobacco, and vaping devices, such as e-cigarettes. If you need help quitting, ask your health care provider. °Do not use street drugs. °Do not share needles. °Ask your health care provider for help if you need support or information about quitting drugs. °General instructions °Schedule regular health, dental, and eye exams. °Stay current with your vaccines. °Tell your health care provider if: °You often feel depressed. °You have ever been abused or do not feel safe at home. °Summary °Adopting a healthy lifestyle and getting preventive care are important in promoting health and wellness. °Follow your health care provider's instructions about healthy diet, exercising, and getting tested or screened for diseases. °Follow your health care provider's instructions on monitoring your cholesterol and blood pressure. °This information is not intended to replace advice given to you by your health care provider. Make sure you discuss  any questions you have with your health care provider. °Document Revised: 09/26/2020 Document Reviewed: 09/26/2020 °[employer] Patient Education © 2022 [employer] ° °

## 2021-07-12 ENCOUNTER — Encounter: Payer: Self-pay | Admitting: *Deleted

## 2021-07-12 ENCOUNTER — Encounter: Payer: Self-pay | Admitting: Internal Medicine

## 2021-07-12 DIAGNOSIS — Z952 Presence of prosthetic heart valve: Secondary | ICD-10-CM

## 2021-07-12 LAB — COMPLETE METABOLIC PANEL WITH GFR
AG Ratio: 1.4 (calc) (ref 1.0–2.5)
ALT: 26 U/L (ref 9–46)
AST: 21 U/L (ref 10–40)
Albumin: 4.3 g/dL (ref 3.6–5.1)
Alkaline phosphatase (APISO): 41 U/L (ref 36–130)
BUN: 12 mg/dL (ref 7–25)
CO2: 31 mmol/L (ref 20–32)
Calcium: 9.3 mg/dL (ref 8.6–10.3)
Chloride: 106 mmol/L (ref 98–110)
Creat: 1.2 mg/dL (ref 0.60–1.29)
Globulin: 3.1 g/dL (calc) (ref 1.9–3.7)
Glucose, Bld: 68 mg/dL (ref 65–99)
Potassium: 4.4 mmol/L (ref 3.5–5.3)
Sodium: 141 mmol/L (ref 135–146)
Total Bilirubin: 0.8 mg/dL (ref 0.2–1.2)
Total Protein: 7.4 g/dL (ref 6.1–8.1)
eGFR: 75 mL/min/{1.73_m2} (ref >=60)

## 2021-07-12 LAB — CBC WITH DIFFERENTIAL/PLATELET
Absolute Monocytes: 588 cells/uL (ref 200–950)
Basophils Absolute: 71 cells/uL (ref 0–200)
Basophils Relative: 1.7 %
Eosinophils Absolute: 151 cells/uL (ref 15–500)
Eosinophils Relative: 3.6 %
HCT: 40.4 % (ref 38.5–50.0)
Hemoglobin: 12.9 g/dL — ABNORMAL LOW (ref 13.2–17.1)
Lymphs Abs: 1042 cells/uL (ref 850–3900)
MCH: 26.9 pg — ABNORMAL LOW (ref 27.0–33.0)
MCHC: 31.9 g/dL — ABNORMAL LOW (ref 32.0–36.0)
MCV: 84.2 fL (ref 80.0–100.0)
MPV: 11.4 fL (ref 7.5–12.5)
Monocytes Relative: 14 %
Neutro Abs: 2348 cells/uL (ref 1500–7800)
Neutrophils Relative %: 55.9 %
Platelets: 177 10*3/uL (ref 140–400)
RBC: 4.8 10*6/uL (ref 4.20–5.80)
RDW: 14.8 % (ref 11.0–15.0)
Total Lymphocyte: 24.8 %
WBC: 4.2 10*3/uL (ref 3.8–10.8)

## 2021-07-12 LAB — LIPID PANEL
Cholesterol: 174 mg/dL (ref <200)
HDL: 45 mg/dL (ref >=40)
LDL Cholesterol (Calc): 102 mg/dL (calc) — ABNORMAL HIGH
Non-HDL Cholesterol (Calc): 129 mg/dL (calc) (ref <130)
Total CHOL/HDL Ratio: 3.9 (calc) (ref <5.0)
Triglycerides: 174 mg/dL — ABNORMAL HIGH (ref <150)

## 2021-07-12 LAB — PSA: PSA: 1.25 ng/mL (ref <=4.00)

## 2021-07-12 NOTE — Progress Notes (Signed)
Cardiac Individual Treatment Plan  Patient Details  Name: MATEEN FRANSSEN MRN: [id number] Date of Birth: Dec 06, 1975 Referring Provider:   Flowsheet Row Cardiac Rehab from 06/19/2021 in [facility] Cardiac and Pulmonary Rehab  Referring Provider Dr. Virl Axe       Initial Encounter Date:  Flowsheet Row Cardiac Rehab from 06/19/2021 in [facility] Cardiac and Pulmonary Rehab  Date 06/19/21       Visit Diagnosis: S/P AVR (aortic valve replacement)  Patient's Home Medications on Admission:  Current Outpatient Medications:    acetaminophen (TYLENOL) 500 MG tablet, Take 500 mg by mouth every 6 (six) hours as needed for moderate pain., Disp: , Rfl:    aspirin EC 81 MG EC tablet, Take 1 tablet (81 mg total) by mouth daily. Swallow whole., Disp: 30 tablet, Rfl: 11   carvedilol (COREG) 6.25 MG tablet, Take 1 tablet (6.25 mg total) by mouth 2 (two) times daily., Disp: 180 tablet, Rfl: 3   fluticasone (FLONASE) 50 MCG/ACT nasal spray, Place 2 sprays into both nostrils daily as needed for allergies., Disp: , Rfl:    fluticasone (FLONASE) 50 MCG/ACT nasal spray, Place into the nose., Disp: , Rfl:    loratadine (CLARITIN) 10 MG tablet, Take by mouth as needed., Disp: , Rfl:    magnesium oxide (MAG-OX) 400 MG tablet, Take 1 tablet (400 mg total) by mouth 2 (two) times daily., Disp: 180 tablet, Rfl: 3   meclizine (ANTIVERT) 25 MG tablet, Take 0.5-1 tablets (12.5-25 mg total) by mouth 3 (three) times daily as needed for dizziness., Disp: 30 tablet, Rfl: 2   Multiple Vitamin (MULTIVITAMIN) tablet, Take 2 tablets by mouth daily., Disp: , Rfl:    ondansetron (ZOFRAN-ODT) 4 MG disintegrating tablet, Take by mouth as needed., Disp: , Rfl:    oxyCODONE (OXY IR/ROXICODONE) 5 MG immediate release tablet, Take 1 tablet (5 mg total) by mouth every 4 (four) hours as needed for severe pain., Disp: 30 tablet, Rfl: 0   potassium chloride SA (KLOR-CON M) 20 MEQ tablet, Take 1 tablet (20 mEq total) by mouth daily. For 4 days  then stop., Disp: 4 tablet, Rfl: 0   ranolazine (RANEXA) 1000 MG SR tablet, Take 1 tablet (1,000 mg total) by mouth 2 (two) times daily., Disp: 180 tablet, Rfl: 3   sacubitril-valsartan (ENTRESTO) 24-26 MG, Take 1 tablet by mouth 2 (two) times daily., Disp: 180 tablet, Rfl: 3   spironolactone (ALDACTONE) 25 MG tablet, Take 0.5 tablets (12.5 mg total) by mouth daily., Disp: 45 tablet, Rfl: 3   torsemide (DEMADEX) 20 MG tablet, Take 1 tablet (20 mg total) by mouth daily. For 4 days then stop., Disp: 4 tablet, Rfl: 0   triamcinolone cream (KENALOG) 0.1 %, Apply 1 application topically 2 (two) times daily as needed., Disp: 30 g, Rfl: 0   triamcinolone cream (KENALOG) 0.1 %, Apply topically as needed., Disp: , Rfl:    umeclidinium bromide (INCRUSE ELLIPTA) 62.5 MCG/ACT AEPB, Inhale 1 puff into the lungs as needed., Disp: , Rfl:    warfarin (COUMADIN) 5 MG tablet, TAKE 1 AND 1/2 TABLETS BY MOUTH DAILY OR AS DIRECTED BY THE COUMADIN CLINIC, Disp: 50 tablet, Rfl: 3  Past Medical History: Past Medical History:  Diagnosis Date   AICD (automatic cardioverter/defibrillator) present    Anxiety    Aortic valve replaced    Requiring replacement, specifics not available   Complication of anesthesia    N & V   Dysrhythmia    Hearing loss of both ears 12/22/2015  Heart murmur    Hypertension    IBD (inflammatory bowel disease) 09/23/2015   ICD (implantable cardiac defibrillator), BSX single    Myocardial infarction [facility])    Obesity 12/22/2015   PONV (postoperative nausea and vomiting)    Secondary cardiomyopathy ([place])    Syncope    Ventricular septal defect    Ventricular tachycardia    appropriate VT shock therapy /13    Tobacco Use: Social History   Tobacco Use  Smoking Status Never  Smokeless Tobacco Never    Labs: Recent Review Flowsheet Data     Labs for ITP Cardiac and Pulmonary Rehab Latest Ref Rng & Units 04/27/2021 04/27/2021 04/28/2021 04/28/2021 07/11/2021   Cholestrol <200 mg/dL  - - - - 174   LDLCALC mg/dL (calc) - - - - 102(H)   HDL > OR = 40 mg/dL - - - - 45   Trlycerides <150 mg/dL - - - - 174(H)   Hemoglobin A1c 4.8 - 5.6 % - - - - -   PHART 7.350 - 7.450 7.413 7.386 7.334(L) 7.334(L) -   PCO2ART 32.0 - 48.0 mmHg 37.0 43.8 47.0 48.6(H) -   HCO3 20.0 - 28.0 mmol/L 24.3 26.3 25.0 25.9 -   TCO2 22 - 32 mmol/L $RemoveB'26 28 26 27 'UCkNbwOr$ -   ACIDBASEDEF 0.0 - 2.0 mmol/L 1.0 - 1.0 - -   O2SAT % 100.0 100.0 96.0 98.0 -        Exercise Target Goals: Exercise Program Goal: Individual exercise prescription set using results from initial 6 min walk test and THRR while considering  patients activity barriers and safety.   Exercise Prescription Goal: Initial exercise prescription builds to 30-45 minutes a day of aerobic activity, 2-3 days per week.  Home exercise guidelines will be given to patient during program as part of exercise prescription that the participant will acknowledge.   Education: Aerobic Exercise: - Group verbal and visual presentation on the components of exercise prescription. Introduces F.I.T.T principle from ACSM for exercise prescriptions.  Reviews F.I.T.T. principles of aerobic exercise including progression. Written material given at graduation.   Education: Resistance Exercise: - Group verbal and visual presentation on the components of exercise prescription. Introduces F.I.T.T principle from ACSM for exercise prescriptions  Reviews F.I.T.T. principles of resistance exercise including progression. Written material given at graduation.    Education: Exercise & Equipment Safety: - Individual verbal instruction and demonstration of equipment use and safety with use of the equipment. Flowsheet Row Cardiac Rehab from 06/21/2021 in [facility] Cardiac and Pulmonary Rehab  Date 06/19/21  Educator [facility]  Instruction Review Code 1- Verbalizes Understanding       Education: Exercise Physiology & General Exercise Guidelines: - Group verbal and written instruction with  models to review the exercise physiology of the cardiovascular system and associated critical values. Provides general exercise guidelines with specific guidelines to those with heart or lung disease.    Education: Flexibility, Balance, Mind/Body Relaxation: - Group verbal and visual presentation with interactive activity on the components of exercise prescription. Introduces F.I.T.T principle from ACSM for exercise prescriptions. Reviews F.I.T.T. principles of flexibility and balance exercise training including progression. Also discusses the mind body connection.  Reviews various relaxation techniques to help reduce and manage stress (i.e. Deep breathing, progressive muscle relaxation, and visualization). Balance handout provided to take home. Written material given at graduation.   Activity Barriers & Risk Stratification:   6 Minute Walk:  6 Minute Walk     Row Name 06/19/21 1057  6 Minute Walk   Phase Initial     Distance 1300 feet     Walk Time 6 minutes     # of Rest Breaks 0     MPH 2.46     METS 4.2     RPE 7     VO2 Peak 14.7     Symptoms No     Resting HR 80 bpm     Resting BP 100/70     Resting Oxygen Saturation  96 %     Exercise Oxygen Saturation  during 6 min walk 99 %     Max Ex. HR 100 bpm     Max Ex. BP 124/78     2 Minute Post BP 104/64              Oxygen Initial Assessment:   Oxygen Re-Evaluation:   Oxygen Discharge (Final Oxygen Re-Evaluation):   Initial Exercise Prescription:  Initial Exercise Prescription - 06/19/21 1000       Date of Initial Exercise RX and Referring Provider   Date 06/19/21    Referring Provider Dr. Virl Axe      Oxygen   Maintain Oxygen Saturation 88% or higher      Treadmill   MPH 3.5    Grade 1    Minutes 15    METs 4.16      Recumbant Bike   Level 3    RPM 60    Minutes 15    METs 4.2      Recumbant Elliptical   Level 2    RPM 50    Minutes 15    METs 4.2      REL-XR   Level 3     Speed 50    Minutes 15    METs 4.2      Prescription Details   Frequency (times per week) 3    Duration Progress to 30 minutes of continuous aerobic without signs/symptoms of physical distress      Intensity   THRR 40-80% of Max Heartrate 117-154    Ratings of Perceived Exertion 11-13    Perceived Dyspnea 0-4      Progression   Progression Continue to progress workloads to maintain intensity without signs/symptoms of physical distress.      Resistance Training   Training Prescription Yes    Weight 3    Reps 10-15             Perform Capillary Blood Glucose checks as needed.  Exercise Prescription Changes:   Exercise Prescription Changes     Row Name 06/19/21 1100 06/30/21 1100 07/05/21 1700         Response to Exercise   Blood Pressure (Admit) 100/70 -- 122/62     Blood Pressure (Exercise) 124/78 -- 126/64     Blood Pressure (Exit) 104/64 -- 118/62     Heart Rate (Admit) 80 bpm -- 89 bpm     Heart Rate (Exercise) 100 bpm -- 120 bpm     Heart Rate (Exit) 82 bpm -- 96 bpm     Oxygen Saturation (Admit) 96 % -- --     Oxygen Saturation (Exercise) 99 % -- --     Oxygen Saturation (Exit) 98 % -- --     Rating of Perceived Exertion (Exercise) 7 -- 11     Symptoms none -- none     Comments 6 MWT results -- --     Duration -- -- Continue with 30 min of  aerobic exercise without signs/symptoms of physical distress.     Intensity -- -- THRR unchanged       Progression   Progression -- -- Continue to progress workloads to maintain intensity without signs/symptoms of physical distress.     Average METs -- -- 4.7       Resistance Training   Training Prescription Yes -- Yes     Weight 3 -- 3 lb     Reps 10-15 -- 10-15       Treadmill   MPH 3.5 -- 3.2     Grade 1 -- 1     Minutes 15 -- 15     METs 4.16 -- 3.89       Recumbant Bike   Level 3 -- --     RPM 60 -- --     Minutes 15 -- --     METs 4.2 -- --       Recumbant Elliptical   Level 2 -- --     RPM 50 --  --     Minutes 15 -- --     METs 4.2 -- --       REL-XR   Level 3 -- 7     Speed 50 -- 50     Minutes 15 -- 15     METs 4.2 -- 5.5       Home Exercise Plan   Plans to continue exercise at -- Home (comment)  walking, elliptial, staff videos Home (comment)  walking, elliptial, staff videos     Frequency -- Add 2 additional days to program exercise sessions. Add 2 additional days to program exercise sessions.     Initial Home Exercises Provided -- 06/30/21 06/30/21              Exercise Comments:   Exercise Comments     Row Name 06/21/21 1110           Exercise Comments First full day of exercise!  Patient was oriented to gym and equipment including functions, settings, policies, and procedures.  Patient's individual exercise prescription and treatment plan were reviewed.  All starting workloads were established based on the results of the 6 minute walk test done at initial orientation visit.  The plan for exercise progression was also introduced and progression will be customized based on patient's performance and goals.                Exercise Goals and Review:   Exercise Goals     Row Name 06/19/21 1103             Exercise Goals   Increase Physical Activity Yes       Intervention Provide advice, education, support and counseling about physical activity/exercise needs.;Develop an individualized exercise prescription for aerobic and resistive training based on initial evaluation findings, risk stratification, comorbidities and participant's personal goals.       Expected Outcomes Short Term: Attend rehab on a regular basis to increase amount of physical activity.;Long Term: Add in home exercise to make exercise part of routine and to increase amount of physical activity.;Long Term: Exercising regularly at least 3-5 days a week.       Increase Strength and Stamina Yes       Intervention Provide advice, education, support and counseling about physical  activity/exercise needs.;Develop an individualized exercise prescription for aerobic and resistive training based on initial evaluation findings, risk stratification, comorbidities and participant's personal goals.       Expected  Outcomes Short Term: Increase workloads from initial exercise prescription for resistance, speed, and METs.;Short Term: Perform resistance training exercises routinely during rehab and add in resistance training at home;Long Term: Improve cardiorespiratory fitness, muscular endurance and strength as measured by increased METs and functional capacity (6MWT)       Able to understand and use rate of perceived exertion (RPE) scale Yes       Intervention Provide education and explanation on how to use RPE scale       Expected Outcomes Short Term: Able to use RPE daily in rehab to express subjective intensity level;Long Term:  Able to use RPE to guide intensity level when exercising independently       Able to understand and use Dyspnea scale Yes       Intervention Provide education and explanation on how to use Dyspnea scale       Expected Outcomes Short Term: Able to use Dyspnea scale daily in rehab to express subjective sense of shortness of breath during exertion;Long Term: Able to use Dyspnea scale to guide intensity level when exercising independently       Knowledge and understanding of Target Heart Rate Range (THRR) Yes       Intervention Provide education and explanation of THRR including how the numbers were predicted and where they are located for reference       Expected Outcomes Short Term: Able to state/look up THRR;Long Term: Able to use THRR to govern intensity when exercising independently;Short Term: Able to use daily as guideline for intensity in rehab       Able to check pulse independently Yes       Intervention Provide education and demonstration on how to check pulse in carotid and radial arteries.;Review the importance of being able to check your own pulse for  safety during independent exercise       Expected Outcomes Short Term: Able to explain why pulse checking is important during independent exercise;Long Term: Able to check pulse independently and accurately       Understanding of Exercise Prescription Yes       Intervention Provide education, explanation, and written materials on patient's individual exercise prescription       Expected Outcomes Short Term: Able to explain program exercise prescription;Long Term: Able to explain home exercise prescription to exercise independently                Exercise Goals Re-Evaluation :  Exercise Goals Re-Evaluation     Row Name 06/21/21 1110 06/30/21 1116 07/05/21 1724         Exercise Goal Re-Evaluation   Exercise Goals Review Increase Physical Activity;Able to understand and use rate of perceived exertion (RPE) scale;Knowledge and understanding of Target Heart Rate Range (THRR);Understanding of Exercise Prescription;Increase Strength and Stamina;Able to understand and use Dyspnea scale;Able to check pulse independently Increase Physical Activity;Able to understand and use rate of perceived exertion (RPE) scale;Knowledge and understanding of Target Heart Rate Range (THRR);Understanding of Exercise Prescription;Increase Strength and Stamina;Able to understand and use Dyspnea scale;Able to check pulse independently Increase Physical Activity;Increase Strength and Stamina     Comments Reviewed RPE and dyspnea scales, THR and program prescription with pt today.  Pt voiced understanding and was given a copy of goals to take home. Audry Pili is doing well in rehab.  He is already walking for 30 min at home.  Reviewed home exercise with pt today.  Pt plans to walk and use elliptical at home for exercise. We also talked  about using staff videos.  Reviewed THR, pulse, RPE, sign and symptoms, pulse oximetery and when to call 911 or MD.  Also discussed weather considerations and indoor options.  Pt voiced understanding.  Perl has done well in session so far. He reaches THR range and works in correct RPE.  Staff will continue to monitor progress.     Expected Outcomes Short: Use RPE daily to regulate intensity. Long: Follow program prescription in THR. Short; Start to add in walking and elliptical at home Long: Conitnue to improve stamina. Short: attend consistently  Long:  improve overall MET level              Discharge Exercise Prescription (Final Exercise Prescription Changes):  Exercise Prescription Changes - 07/05/21 1700       Response to Exercise   Blood Pressure (Admit) 122/62    Blood Pressure (Exercise) 126/64    Blood Pressure (Exit) 118/62    Heart Rate (Admit) 89 bpm    Heart Rate (Exercise) 120 bpm    Heart Rate (Exit) 96 bpm    Rating of Perceived Exertion (Exercise) 11    Symptoms none    Duration Continue with 30 min of aerobic exercise without signs/symptoms of physical distress.    Intensity THRR unchanged      Progression   Progression Continue to progress workloads to maintain intensity without signs/symptoms of physical distress.    Average METs 4.7      Resistance Training   Training Prescription Yes    Weight 3 lb    Reps 10-15      Treadmill   MPH 3.2    Grade 1    Minutes 15    METs 3.89      REL-XR   Level 7    Speed 50    Minutes 15    METs 5.5      Home Exercise Plan   Plans to continue exercise at Home (comment)   walking, elliptial, staff videos   Frequency Add 2 additional days to program exercise sessions.    Initial Home Exercises Provided 06/30/21             Nutrition:  Target Goals: Understanding of nutrition guidelines, daily intake of sodium 1500mg , cholesterol 200mg , calories 30% from fat and 7% or less from saturated fats, daily to have 5 or more servings of fruits and vegetables.  Education: All About Nutrition: -Group instruction provided by verbal, written material, interactive activities, discussions, models, and posters to  present general guidelines for heart healthy nutrition including fat, fiber, MyPlate, the role of sodium in heart healthy nutrition, utilization of the nutrition label, and utilization of this knowledge for meal planning. Follow up email sent as well. Written material given at graduation. Flowsheet Row Cardiac Rehab from 06/21/2021 in [facility] Cardiac and Pulmonary Rehab  Education need identified 06/19/21       Biometrics:  Pre Biometrics - 06/19/21 1103       Pre Biometrics   Height 5' 10.75" (1.797 m)    Weight 191 lb 8 oz (86.9 kg)    BMI (Calculated) 26.9    Single Leg Stand 30 seconds              Nutrition Therapy Plan and Nutrition Goals:  Nutrition Therapy & Goals - 07/05/21 1349       Nutrition Therapy   Diet Heart healthy, low Na    Drug/Food Interactions Coumadin/Vit K    Protein (specify units) 70g  Fiber 30 grams    Whole Grain Foods 3 servings    Saturated Fats 12 max. grams    Fruits and Vegetables 8 servings/day    Sodium 2 grams      Personal Nutrition Goals   Nutrition Goal ST: practice meal planning and ingredient preppring - start with a couple of versatile ingredients LT: 8 servings fruits/vegetables per day, Follow MyPlate structure    Comments 46 y.o. M admitted to rehab for s/p AVR also presenting with HTN, CAD, ICD (placement 2017). PMHx IBD (unspecified in problem list). PSHx cholecystectomy, CABG. Relevant medications include warfarin, coreg, mag-ox, MVI, zofran, potassium, torsemide. Per MD note, 06/20/21, pt would like to get back to a vegan diet. PYP Score: 64. Vegetables & Fruits 7/12. Breads, Grains & Cereals 8/12. Red & Processed Meat 12/12. Poultry 0/2. Fish & Shellfish 1/4. Beans, Nuts & Seeds 0/4. Milk & Dairy Foods 5/6. Toppings, Oils, Seasonings & Salt 15/20. Sweets, Snacks & Restaurant Food 8/14. Beverages 8/10. He does not eat red meat, mainly poultry and fish. He was eating more vegan and now is not due to the warfarin. Encouraged that with  warfarin and vitamin K consistency is key. He was and continues to take a mutlivitamin. He feels like his biggest issue is snacks - usually oranges and apples, but sometimes has a sweet tooth. B: Veggie patty and eggwhites or oatmeal or cheerios. L: fruit, small portion of chicken and vegetables S: candy bar D: Pizza, buffalo wings, meat with vegetables and potato. Discussed honoring hunger and meal planning tips. Aramis is open to practicing meal planning skills. Discussed general heart healthy eating.      Intervention Plan   Intervention Prescribe, educate and counsel regarding individualized specific dietary modifications aiming towards targeted core components such as weight, hypertension, lipid management, diabetes, heart failure and other comorbidities.    Expected Outcomes Short Term Goal: Understand basic principles of dietary content, such as calories, fat, sodium, cholesterol and nutrients.;Short Term Goal: A plan has been developed with personal nutrition goals set during dietitian appointment.;Long Term Goal: Adherence to prescribed nutrition plan.             Nutrition Assessments:  MEDIFICTS Score Key: ?70 Need to make dietary changes  40-70 Heart Healthy Diet ? 40 Therapeutic Level Cholesterol Diet  Flowsheet Row Cardiac Rehab from 06/19/2021 in [facility] Cardiac and Pulmonary Rehab  Picture Your Plate Total Score on Admission 64      Picture Your Plate Scores: <42 Unhealthy dietary pattern with much room for improvement. 41-50 Dietary pattern unlikely to meet recommendations for good health and room for improvement. 51-60 More healthful dietary pattern, with some room for improvement.  >60 Healthy dietary pattern, although there may be some specific behaviors that could be improved.    Nutrition Goals Re-Evaluation:  Nutrition Goals Re-Evaluation     [place] Name 06/30/21 1120             Goals   Nutrition Goal Meet with dietitian       Comment Demarkus is scheduled to  meet with dietitian next week over the phone.       Expected Outcome Discuss nutrition                Nutrition Goals Discharge (Final Nutrition Goals Re-Evaluation):  Nutrition Goals Re-Evaluation - 06/30/21 1120       Goals   Nutrition Goal Meet with dietitian    Comment Jaxx is scheduled to meet with dietitian next week over  the phone.    Expected Outcome Discuss nutrition             Psychosocial: Target Goals: Acknowledge presence or absence of significant depression and/or stress, maximize coping skills, provide positive support system. Participant is able to verbalize types and ability to use techniques and skills needed for reducing stress and depression.   Education: Stress, Anxiety, and Depression - Group verbal and visual presentation to define topics covered.  Reviews how body is impacted by stress, anxiety, and depression.  Also discusses healthy ways to reduce stress and to treat/manage anxiety and depression.  Written material given at graduation.   Education: Sleep Hygiene -Provides group verbal and written instruction about how sleep can affect your health.  Define sleep hygiene, discuss sleep cycles and impact of sleep habits. Review good sleep hygiene tips.    Initial Review & Psychosocial Screening:  Initial Psych Review & Screening - 05/25/21 1008       Initial Review   Current issues with None Identified      Family Dynamics   Good Support System? Yes    Comments He can look to his whole family for support. He has a good outlook on his health. He has not done much exercise before his event and is ready to get healthier.      Barriers   Psychosocial barriers to participate in program There are no identifiable barriers or psychosocial needs.;The patient should benefit from training in stress management and relaxation.      Screening Interventions   Interventions Encouraged to exercise;To provide support and resources with identified psychosocial  needs;Provide feedback about the scores to participant    Expected Outcomes Short Term goal: Utilizing psychosocial counselor, staff and physician to assist with identification of specific Stressors or current issues interfering with healing process. Setting desired goal for each stressor or current issue identified.;Long Term Goal: Stressors or current issues are controlled or eliminated.;Short Term goal: Identification and review with participant of any Quality of Life or Depression concerns found by scoring the questionnaire.;Long Term goal: The participant improves quality of Life and PHQ9 Scores as seen by post scores and/or verbalization of changes             Quality of Life Scores:   Quality of Life - 06/19/21 1108       Quality of Life   Select Quality of Life      Quality of Life Scores   Health/Function Pre 20.57 %    Socioeconomic Pre 23.57 %    Psych/Spiritual Pre 24.43 %    Family Pre 22.9 %    GLOBAL Pre 22.32 %            Scores of 19 and below usually indicate a poorer quality of life in these areas.  A difference of  2-3 points is a clinically meaningful difference.  A difference of 2-3 points in the total score of the Quality of Life Index has been associated with significant improvement in overall quality of life, self-image, physical symptoms, and general health in studies assessing change in quality of life.  PHQ-9: Recent Review Flowsheet Data     Depression screen [facility] 2/9 07/11/2021 06/19/2021 02/25/2020 02/17/2020 05/04/2019   Decreased Interest 0 0 0 0 0   Down, Depressed, Hopeless 0 0 0 0 0   PHQ - 2 Score 0 0 0 0 0   Altered sleeping 0 0 0 - 0   Tired, decreased energy 0 0 0 - 0  Change in appetite 0 0 0 - 0   Feeling bad or failure about yourself  0 0 0 - 0   Trouble concentrating 0 0 0 - 0   Moving slowly or fidgety/restless 0 0 0 - 0   Suicidal thoughts 0 0 0 - 0   PHQ-9 Score 0 0 0 - 0   Difficult doing work/chores Not difficult at all Not  difficult at all Not difficult at all - Not difficult at all      Interpretation of Total Score  Total Score Depression Severity:  1-4 = Minimal depression, 5-9 = Mild depression, 10-14 = Moderate depression, 15-19 = Moderately severe depression, 20-27 = Severe depression   Psychosocial Evaluation and Intervention:  Psychosocial Evaluation - 05/25/21 1009       Psychosocial Evaluation & Interventions   Interventions Encouraged to exercise with the program and follow exercise prescription;Relaxation education;Stress management education    Comments He can look to his whole family for support. He has a good outlook on his health. He has not done much exercise before his event and is ready to get healthier. He works in Engineer, technical sales.    Expected Outcomes Short: Start [facility] to help with mood. Long: Maintain a healthy mental state    Continue Psychosocial Services  Follow up required by staff             Psychosocial Re-Evaluation:  Psychosocial Re-Evaluation     [place] Name 05/25/21 1010 06/30/21 1117           Psychosocial Re-Evaluation   Current issues with -- None Identified      Comments -- Olamide is doing well in rehab to start.  He denies any current major stressors.  His family is very supportive.  His biggest struggle is trying to balance out medications. He is sleeping okay for the most part.  He does have some rough nights, but okay overall and getting at least 6 hours a night.      Expected Outcomes -- Short: Continue to work on improving sleep Long: continue to focus on positive.      Interventions -- Encouraged to attend Cardiac Rehabilitation for the exercise;Stress management education      Continue Psychosocial Services  -- Follow up required by staff               Psychosocial Discharge (Final Psychosocial Re-Evaluation):  Psychosocial Re-Evaluation - 06/30/21 1117       Psychosocial Re-Evaluation   Current issues with None Identified    Comments Mykai is doing  well in rehab to start.  He denies any current major stressors.  His family is very supportive.  His biggest struggle is trying to balance out medications. He is sleeping okay for the most part.  He does have some rough nights, but okay overall and getting at least 6 hours a night.    Expected Outcomes Short: Continue to work on improving sleep Long: continue to focus on positive.    Interventions Encouraged to attend Cardiac Rehabilitation for the exercise;Stress management education    Continue Psychosocial Services  Follow up required by staff             Vocational Rehabilitation: Provide vocational rehab assistance to qualifying candidates.   Vocational Rehab Evaluation & Intervention:   Education: Education Goals: Education classes will be provided on a variety of topics geared toward better understanding of heart health and risk factor modification. Participant will state understanding/return demonstration of topics presented as  noted by education test scores.  Learning Barriers/Preferences:  Learning Barriers/Preferences - 05/25/21 1007       Learning Barriers/Preferences   Learning Barriers None    Learning Preferences None             General Cardiac Education Topics:  AED/CPR: - Group verbal and written instruction with the use of models to demonstrate the basic use of the AED with the basic ABC's of resuscitation.   Anatomy and Cardiac Procedures: - Group verbal and visual presentation and models provide information about basic cardiac anatomy and function. Reviews the testing methods done to diagnose heart disease and the outcomes of the test results. Describes the treatment choices: Medical Management, Angioplasty, or Coronary Bypass Surgery for treating various heart conditions including Myocardial Infarction, Angina, Valve Disease, and Cardiac Arrhythmias.  Written material given at graduation.   Medication Safety: - Group verbal and visual instruction to  review commonly prescribed medications for heart and lung disease. Reviews the medication, class of the drug, and side effects. Includes the steps to properly store meds and maintain the prescription regimen.  Written material given at graduation.   Intimacy: - Group verbal instruction through game format to discuss how heart and lung disease can affect sexual intimacy. Written material given at graduation..   Know Your Numbers and Heart Failure: - Group verbal and visual instruction to discuss disease risk factors for cardiac and pulmonary disease and treatment options.  Reviews associated critical values for Overweight/Obesity, Hypertension, Cholesterol, and Diabetes.  Discusses basics of heart failure: signs/symptoms and treatments.  Introduces Heart Failure Zone chart for action plan for heart failure.  Written material given at graduation. Flowsheet Row Cardiac Rehab from 06/21/2021 in [facility] Cardiac and Pulmonary Rehab  Date 06/21/21  Educator [facility]  Instruction Review Code 1- Verbalizes Understanding       Infection Prevention: - Provides verbal and written material to individual with discussion of infection control including proper hand washing and proper equipment cleaning during exercise session. Flowsheet Row Cardiac Rehab from 06/21/2021 in [facility] Cardiac and Pulmonary Rehab  Date 06/19/21  Educator [facility]  Instruction Review Code 1- Verbalizes Understanding       Falls Prevention: - Provides verbal and written material to individual with discussion of falls prevention and safety. Flowsheet Row Cardiac Rehab from 06/21/2021 in [facility] Cardiac and Pulmonary Rehab  Date 06/19/21  Educator [facility]  Instruction Review Code 1- Verbalizes Understanding       Other: -Provides group and verbal instruction on various topics (see comments)   Knowledge Questionnaire Score:  Knowledge Questionnaire Score - 06/19/21 1108       Knowledge Questionnaire Score   Pre Score 25/26              Core Components/Risk Factors/Patient Goals at Admission:  Personal Goals and Risk Factors at Admission - 06/19/21 1105       Core Components/Risk Factors/Patient Goals on Admission    Weight Management Yes;Weight Loss    Intervention Weight Management: Develop a combined nutrition and exercise program designed to reach desired caloric intake, while maintaining appropriate intake of nutrient and fiber, sodium and fats, and appropriate energy expenditure required for the weight goal.;Weight Management: Provide education and appropriate resources to help participant work on and attain dietary goals.;Weight Management/Obesity: Establish reasonable short term and long term weight goals.    Admit Weight 191 lb 8 oz (86.9 kg)    Goal Weight: Short Term 185 lb (83.9 kg)    Goal Weight: Long Term  185 lb (83.9 kg)    Expected Outcomes Long Term: Adherence to nutrition and physical activity/exercise program aimed toward attainment of established weight goal;Short Term: Continue to assess and modify interventions until short term weight is achieved;Understanding recommendations for meals to include 15-35% energy as protein, 25-35% energy from fat, 35-60% energy from carbohydrates, less than $RemoveB'200mg'gcFKnnGs$  of dietary cholesterol, 20-35 gm of total fiber daily;Understanding of distribution of calorie intake throughout the day with the consumption of 4-5 meals/snacks;Weight Loss: Understanding of general recommendations for a balanced deficit meal plan, which promotes 1-2 lb weight loss per week and includes a negative energy balance of [phone] kcal/d    Heart Failure Yes    Intervention Provide a combined exercise and nutrition program that is supplemented with education, support and counseling about heart failure. Directed toward relieving symptoms such as shortness of breath, decreased exercise tolerance, and extremity edema.    Expected Outcomes Improve functional capacity of life;Short term: Attendance in program  2-3 days a week with increased exercise capacity. Reported lower sodium intake. Reported increased fruit and vegetable intake. Reports medication compliance.;Short term: Daily weights obtained and reported for increase. Utilizing diuretic protocols set by physician.;Long term: Adoption of self-care skills and reduction of barriers for early signs and symptoms recognition and intervention leading to self-care maintenance.    Hypertension Yes    Intervention Provide education on lifestyle modifcations including regular physical activity/exercise, weight management, moderate sodium restriction and increased consumption of fresh fruit, vegetables, and low fat dairy, alcohol moderation, and smoking cessation.;Monitor prescription use compliance.    Expected Outcomes Short Term: Continued assessment and intervention until BP is < 140/25mm HG in hypertensive participants. < 130/52mm HG in hypertensive participants with diabetes, heart failure or chronic kidney disease.;Long Term: Maintenance of blood pressure at goal levels.             Education:Diabetes - Individual verbal and written instruction to review signs/symptoms of diabetes, desired ranges of glucose level fasting, after meals and with exercise. Acknowledge that pre and post exercise glucose checks will be done for 3 sessions at entry of program.   Core Components/Risk Factors/Patient Goals Review:   Goals and Risk Factor Review     Row Name 06/30/21 1120             Core Components/Risk Factors/Patient Goals Review   Personal Goals Review Weight Management/Obesity;Hypertension;Lipids;Heart Failure       Review Jamale is doing well in rehab.  His weight has crept up a little bit.  We talked about using some of his extra lasix to help get fluid off as well.  His pressures are stabilizing since switching up his meds some, so he is pleased with this.  He checks it about 2-3x a day at home and keeping a log.  Other than his weight, he has  not had any heart failure symptoms.       Expected Outcomes Short: Work on weight loss Long: continue to manage heart failure symptoms.                Core Components/Risk Factors/Patient Goals at Discharge (Final Review):   Goals and Risk Factor Review - 06/30/21 1120       Core Components/Risk Factors/Patient Goals Review   Personal Goals Review Weight Management/Obesity;Hypertension;Lipids;Heart Failure    Review Salar is doing well in rehab.  His weight has crept up a little bit.  We talked about using some of his extra lasix to help get fluid off as well.  His pressures are stabilizing since switching up his meds some, so he is pleased with this.  He checks it about 2-3x a day at home and keeping a log.  Other than his weight, he has not had any heart failure symptoms.    Expected Outcomes Short: Work on weight loss Long: continue to manage heart failure symptoms.             ITP Comments:  ITP Comments     Row Name 05/25/21 1014 06/19/21 1056 06/21/21 1110 07/05/21 1433 07/12/21 0848   ITP Comments Virtual Visit completed. Patient informed on EP and RD appointment and 6 Minute walk test. Patient also informed of patient health questionnaires on My Chart. Patient Verbalizes understanding. Visit diagnosis can be found in [facility] 04/27/2021. Completed 6MWT and gym orientation. Initial ITP created and sent for review to Dr. Emily Filbert, Medical Director. First full day of exercise!  Patient was oriented to gym and equipment including functions, settings, policies, and procedures.  Patient's individual exercise prescription and treatment plan were reviewed.  All starting workloads were established based on the results of the 6 minute walk test done at initial orientation visit.  The plan for exercise progression was also introduced and progression will be customized based on patient's performance and goals. Completed initial RD consultation 30 Day review completed. Medical Director ITP  review done, changes made as directed, and signed approval by Medical Director.            Comments:

## 2021-07-12 NOTE — Progress Notes (Signed)
Daily Session Note  Patient Details  Name: [name] MRN: [id number] Date of Birth: 10-20-1975 Referring Provider:   Flowsheet Row Cardiac Rehab from 06/19/2021 in [facility] Cardiac and Pulmonary Rehab  Referring Provider Dr. Virl Axe       Encounter Date: 07/12/2021  Check In:  Session Check In - 07/12/21 1109       Check-In   Supervising physician immediately available to respond to emergencies See telemetry face sheet for immediately available ER MD    Location [facility]-Cardiac & Pulmonary Rehab    Staff Present Birdie Sons, MPA, Elveria Rising, BA, ACSM CEP, Exercise Physiologist;Joseph Tessie Fass, [place]    Virtual Visit No    Medication changes reported     No    Fall or balance concerns reported    No    Warm-up and Cool-down Performed on first and last piece of equipment    Resistance Training Performed Yes    VAD Patient? No    PAD/SET Patient? No      Pain Assessment   Currently in Pain? No/denies                Social History   Tobacco Use  Smoking Status Never  Smokeless Tobacco Never    Goals Met:  Independence with exercise equipment Exercise tolerated well No report of concerns or symptoms today Strength training completed today  Goals Unmet:  Not Applicable  Comments: Pt able to follow exercise prescription today without complaint.  Will continue to monitor for progression.    Dr. Emily Filbert is Medical Director for [place].  Dr. Ottie Glazier is Medical Director for [facility] Pulmonary Rehabilitation.

## 2021-07-14 ENCOUNTER — Other Ambulatory Visit: Payer: 59

## 2021-07-17 ENCOUNTER — Other Ambulatory Visit: Payer: Self-pay

## 2021-07-17 ENCOUNTER — Encounter: Payer: 59 | Admitting: *Deleted

## 2021-07-17 DIAGNOSIS — Z952 Presence of prosthetic heart valve: Secondary | ICD-10-CM

## 2021-07-17 NOTE — Progress Notes (Signed)
Daily Session Note  Patient Details  Name: [name] MRN: [id number] Date of Birth: 1976/01/20 Referring Provider:   Flowsheet Row Cardiac Rehab from 06/19/2021 in [facility] Cardiac and Pulmonary Rehab  Referring Provider Dr. Virl Axe       Encounter Date: 07/17/2021  Check In:  Session Check In - 07/17/21 1113       Check-In   Supervising physician immediately available to respond to emergencies See telemetry face sheet for immediately available ER MD    Location [facility]-Cardiac & Pulmonary Rehab    Staff Present Renita Papa, RN Moises Blood, BS, ACSM CEP, Exercise Physiologist;Amanda Oletta Darter, BA, ACSM CEP, Exercise Physiologist;Kelly Rosalia Hammers, MPA, RN    Virtual Visit No    Medication changes reported     No    Fall or balance concerns reported    No    Warm-up and Cool-down Performed on first and last piece of equipment    Resistance Training Performed Yes    VAD Patient? No    PAD/SET Patient? No      Pain Assessment   Currently in Pain? No/denies                Social History   Tobacco Use  Smoking Status Never  Smokeless Tobacco Never    Goals Met:  Independence with exercise equipment Exercise tolerated well No report of concerns or symptoms today Strength training completed today  Goals Unmet:  Not Applicable  Comments: Pt able to follow exercise prescription today without complaint.  Will continue to monitor for progression.    Dr. Emily Filbert is Medical Director for [place].  Dr. Ottie Glazier is Medical Director for [facility] Pulmonary Rehabilitation.

## 2021-07-18 ENCOUNTER — Ambulatory Visit (INDEPENDENT_AMBULATORY_CARE_PROVIDER_SITE_OTHER): Payer: 59

## 2021-07-18 ENCOUNTER — Other Ambulatory Visit: Payer: 59 | Admitting: *Deleted

## 2021-07-18 DIAGNOSIS — Z7901 Long term (current) use of anticoagulants: Secondary | ICD-10-CM

## 2021-07-18 DIAGNOSIS — Z952 Presence of prosthetic heart valve: Secondary | ICD-10-CM | POA: Diagnosis not present

## 2021-07-18 DIAGNOSIS — I350 Nonrheumatic aortic (valve) stenosis: Secondary | ICD-10-CM | POA: Diagnosis not present

## 2021-07-18 DIAGNOSIS — I429 Cardiomyopathy, unspecified: Secondary | ICD-10-CM

## 2021-07-18 LAB — POCT INR: INR: 2.3 (ref 2.0–3.0)

## 2021-07-18 NOTE — Patient Instructions (Signed)
Description   Continue taking warfarin 1.5 tablets daily except 2 tablets every Wednesdays. Recheck INR in 4 weeks. Per Dr Vivi Martens note 07/05/21 pt has ON-X valve placed 04/27/21, after 3 months can decrease INR goal 1.5-2.0 with 81mg  ASA.  Be consistent with green (2 times per week)  [facility] [phone]

## 2021-07-19 ENCOUNTER — Other Ambulatory Visit: Payer: Self-pay

## 2021-07-19 ENCOUNTER — Encounter: Payer: 59 | Attending: Internal Medicine

## 2021-07-19 DIAGNOSIS — Z952 Presence of prosthetic heart valve: Secondary | ICD-10-CM | POA: Insufficient documentation

## 2021-07-19 LAB — BASIC METABOLIC PANEL
BUN/Creatinine Ratio: 14 (ref 9–20)
BUN: 17 mg/dL (ref 6–24)
CO2: 24 mmol/L (ref 20–29)
Calcium: 9.4 mg/dL (ref 8.7–10.2)
Chloride: 105 mmol/L (ref 96–106)
Creatinine, Ser: 1.22 mg/dL (ref 0.76–1.27)
Glucose: 95 mg/dL (ref 70–99)
Potassium: 4.5 mmol/L (ref 3.5–5.2)
Sodium: 142 mmol/L (ref 134–144)
eGFR: 74 mL/min/{1.73_m2} (ref >59)

## 2021-07-19 NOTE — Progress Notes (Signed)
Daily Session Note ? ?Patient Details  ?Name: [name] ?MRN: [id number] ?Date of Birth: Jul 28, 1975 ?Referring Provider:   ?Flowsheet Row Cardiac Rehab from 06/19/2021 in [facility] Cardiac and Pulmonary Rehab  ?Referring Provider Dr. Virl Axe  ? ?  ? ? ?Encounter Date: 07/19/2021 ? ?Check In: ? Session Check In - 07/19/21 1113   ? ?  ? Check-In  ? Supervising physician immediately available to respond to emergencies See telemetry face sheet for immediately available ER MD   ? Location [facility]-Cardiac & Pulmonary Rehab   ? Staff Present Birdie Sons, MPA, RN;Joseph Tessie Fass, Lindell Spar, BA, ACSM CEP, Exercise Physiologist;Melissa [place], RDN, LDN   ? Virtual Visit No   ? Medication changes reported     No   ? Fall or balance concerns reported    No   ? Warm-up and Cool-down Performed on first and last piece of equipment   ? Resistance Training Performed Yes   ? VAD Patient? No   ? PAD/SET Patient? No   ?  ? Pain Assessment  ? Currently in Pain? No/denies   ? ?  ?  ? ?  ? ? ? ? ? ?Social History  ? ?Tobacco Use  ?Smoking Status Never  ?Smokeless Tobacco Never  ? ? ?Goals Met:  ?Independence with exercise equipment ?Exercise tolerated well ?No report of concerns or symptoms today ?Strength training completed today ? ?Goals Unmet:  ?Not Applicable ? ?Comments: Pt able to follow exercise prescription today without complaint.  Will continue to monitor for progression. ? ? ? ?Dr. Emily Filbert is Medical Director for [place].  ?Dr. Ottie Glazier is Medical Director for [facility] Pulmonary Rehabilitation. ?

## 2021-07-21 ENCOUNTER — Other Ambulatory Visit: Payer: Self-pay

## 2021-07-21 ENCOUNTER — Encounter: Payer: 59 | Admitting: *Deleted

## 2021-07-21 DIAGNOSIS — Z952 Presence of prosthetic heart valve: Secondary | ICD-10-CM | POA: Diagnosis not present

## 2021-07-21 NOTE — Progress Notes (Signed)
Daily Session Note ? ?Patient Details  ?Name: [name] ?MRN: [id number] ?Date of Birth: 16-Jun-1975 ?Referring Provider:   ?Flowsheet Row Cardiac Rehab from 06/19/2021 in [facility] Cardiac and Pulmonary Rehab  ?Referring Provider Dr. Virl Axe  ? ?  ? ? ?Encounter Date: 07/21/2021 ? ?Check In: ? Session Check In - 07/21/21 1108   ? ?  ? Check-In  ? Supervising physician immediately available to respond to emergencies See telemetry face sheet for immediately available ER MD   ? Location [facility]-Cardiac & Pulmonary Rehab   ? Staff Present Renita Papa, RN BSN;Joseph [place], RCP,RRT,BSRT;Jessica [place], [place], [place], [place], CCET   ? Virtual Visit No   ? Medication changes reported     No   ? Fall or balance concerns reported    No   ? Warm-up and Cool-down Performed on first and last piece of equipment   ? Resistance Training Performed Yes   ? VAD Patient? No   ? PAD/SET Patient? No   ?  ? Pain Assessment  ? Currently in Pain? No/denies   ? ?  ?  ? ?  ? ? ? ? ? ?Social History  ? ?Tobacco Use  ?Smoking Status Never  ?Smokeless Tobacco Never  ? ? ?Goals Met:  ?Independence with exercise equipment ?Exercise tolerated well ?No report of concerns or symptoms today ?Strength training completed today ? ?Goals Unmet:  ?Not Applicable ? ?Comments: Pt able to follow exercise prescription today without complaint.  Will continue to monitor for progression. ? ? ? ?Dr. Emily Filbert is Medical Director for [place].  ?Dr. Ottie Glazier is Medical Director for [facility] Pulmonary Rehabilitation. ?

## 2021-07-26 ENCOUNTER — Other Ambulatory Visit: Payer: Self-pay

## 2021-07-26 DIAGNOSIS — Z952 Presence of prosthetic heart valve: Secondary | ICD-10-CM | POA: Diagnosis not present

## 2021-07-26 NOTE — Progress Notes (Signed)
Daily Session Note ? ?Patient Details  ?Name: [name] ?MRN: [id number] ?Date of Birth: 15-Aug-1975 ?Referring Provider:   ?Flowsheet Row Cardiac Rehab from 06/19/2021 in [facility] Cardiac and Pulmonary Rehab  ?Referring Provider Dr. Virl Axe  ? ?  ? ? ?Encounter Date: 07/26/2021 ? ?Check In: ? Session Check In - 07/26/21 1721   ? ?  ? Check-In  ? Supervising physician immediately available to respond to emergencies See telemetry face sheet for immediately available ER MD   ? Location [facility]-Cardiac & Pulmonary Rehab   ? Staff Present Birdie Sons, MPA, RN;Joseph [place], RCP,RRT,BSRT;Kara Minerva, MS, ASCM CEP, Exercise Physiologist   ? Virtual Visit No   ? Medication changes reported     No   ? Fall or balance concerns reported    No   ? Warm-up and Cool-down Performed on first and last piece of equipment   ? Resistance Training Performed Yes   ? VAD Patient? No   ? PAD/SET Patient? No   ?  ? Pain Assessment  ? Currently in Pain? No/denies   ? ?  ?  ? ?  ? ? ? ? ? ?Social History  ? ?Tobacco Use  ?Smoking Status Never  ?Smokeless Tobacco Never  ? ? ?Goals Met:  ?Independence with exercise equipment ?Exercise tolerated well ?No report of concerns or symptoms today ?Strength training completed today ? ?Goals Unmet:  ?Not Applicable ? ?Comments: Pt able to follow exercise prescription today without complaint.  Will continue to monitor for progression. ? ? ? ?Dr. Emily Filbert is Medical Director for [place].  ?Dr. Ottie Glazier is Medical Director for [facility] Pulmonary Rehabilitation. ?

## 2021-07-27 ENCOUNTER — Encounter: Payer: 59 | Admitting: *Deleted

## 2021-07-27 DIAGNOSIS — Z952 Presence of prosthetic heart valve: Secondary | ICD-10-CM

## 2021-07-27 NOTE — Progress Notes (Signed)
Daily Session Note ? ?Patient Details  ?Name: [name] ?MRN: [id number] ?Date of Birth: September 04, 1975 ?Referring Provider:   ?Flowsheet Row Cardiac Rehab from 06/19/2021 in [facility] Cardiac and Pulmonary Rehab  ?Referring Provider Dr. Virl Axe  ? ?  ? ? ?Encounter Date: 07/27/2021 ? ?Check In: ? Session Check In - 07/27/21 1711   ? ?  ? Check-In  ? Supervising physician immediately available to respond to emergencies See telemetry face sheet for immediately available ER MD   ? Location [facility]-Cardiac & Pulmonary Rehab   ? Staff Present Renita Papa, RN BSN;Joseph [place], [place]   ? Virtual Visit No   ? Medication changes reported     No   ? Fall or balance concerns reported    No   ? Warm-up and Cool-down Performed on first and last piece of equipment   ? Resistance Training Performed Yes   ? VAD Patient? No   ? PAD/SET Patient? No   ?  ? Pain Assessment  ? Currently in Pain? No/denies   ? ?  ?  ? ?  ? ? ? ? ? ?Social History  ? ?Tobacco Use  ?Smoking Status Never  ?Smokeless Tobacco Never  ? ? ?Goals Met:  ?Independence with exercise equipment ?Exercise tolerated well ?No report of concerns or symptoms today ?Strength training completed today ? ?Goals Unmet:  ?Not Applicable ? ?Comments: Pt able to follow exercise prescription today without complaint.  Will continue to monitor for progression. ? ? ? ?Dr. Emily Filbert is Medical Director for [place].  ?Dr. Ottie Glazier is Medical Director for [facility] Pulmonary Rehabilitation. ?

## 2021-08-02 ENCOUNTER — Other Ambulatory Visit: Payer: Self-pay

## 2021-08-02 DIAGNOSIS — Z952 Presence of prosthetic heart valve: Secondary | ICD-10-CM | POA: Diagnosis not present

## 2021-08-02 NOTE — Progress Notes (Signed)
Daily Session Note ? ?Patient Details  ?Name: [name] ?MRN: [id number] ?Date of Birth: 03/15/1976 ?Referring Provider:   ?Flowsheet Row Cardiac Rehab from 06/19/2021 in [facility] Cardiac and Pulmonary Rehab  ?Referring Provider Dr. Virl Axe  ? ?  ? ? ?Encounter Date: 08/02/2021 ? ?Check In: ? Session Check In - 08/02/21 1732   ? ?  ? Check-In  ? Supervising physician immediately available to respond to emergencies See telemetry face sheet for immediately available ER MD   ? Location [facility]-Cardiac & Pulmonary Rehab   ? Staff Present Birdie Sons, MPA, RN;Joseph [place], RCP,RRT,BSRT;Kara [place], MS, ASCM CEP, Exercise Physiologist   ? Virtual Visit No   ? Medication changes reported     No   ? Fall or balance concerns reported    No   ? Warm-up and Cool-down Performed on first and last piece of equipment   ? Resistance Training Performed Yes   ? VAD Patient? No   ? PAD/SET Patient? No   ?  ? Pain Assessment  ? Currently in Pain? No/denies   ? ?  ?  ? ?  ? ? ? ? ? ?Social History  ? ?Tobacco Use  ?Smoking Status Never  ?Smokeless Tobacco Never  ? ? ?Goals Met:  ?Independence with exercise equipment ?Exercise tolerated well ?No report of concerns or symptoms today ?Strength training completed today ? ?Goals Unmet:  ?Not Applicable ? ?Comments: Pt able to follow exercise prescription today without complaint.  Will continue to monitor for progression. ? ? ? ?Dr. Emily Filbert is Medical Director for [place].  ?Dr. Ottie Glazier is Medical Director for [facility] Pulmonary Rehabilitation. ?

## 2021-08-03 ENCOUNTER — Encounter: Payer: 59 | Admitting: *Deleted

## 2021-08-03 DIAGNOSIS — Z952 Presence of prosthetic heart valve: Secondary | ICD-10-CM | POA: Diagnosis not present

## 2021-08-03 NOTE — Progress Notes (Signed)
Daily Session Note ? ?Patient Details  ?Name: [name] ?MRN: [id number] ?Date of Birth: May 20, 1976 ?Referring Provider:   ?Flowsheet Row Cardiac Rehab from 06/19/2021 in [facility] Cardiac and Pulmonary Rehab  ?Referring Provider Dr. Virl Axe  ? ?  ? ? ?Encounter Date: 08/03/2021 ? ?Check In: ? Session Check In - 08/03/21 1727   ? ?  ? Check-In  ? Supervising physician immediately available to respond to emergencies See telemetry face sheet for immediately available ER MD   ? Location [facility]-Cardiac & Pulmonary Rehab   ? Staff Present Renita Papa, RN BSN;Joseph [place], RCP,RRT,BSRT;Kara [place], [place], ASCM CEP, Exercise Physiologist   ? Virtual Visit No   ? Medication changes reported     No   ? Fall or balance concerns reported    No   ? Warm-up and Cool-down Performed on first and last piece of equipment   ? Resistance Training Performed Yes   ? VAD Patient? No   ? PAD/SET Patient? No   ?  ? Pain Assessment  ? Currently in Pain? No/denies   ? ?  ?  ? ?  ? ? ? ? ? ?Social History  ? ?Tobacco Use  ?Smoking Status Never  ?Smokeless Tobacco Never  ? ? ?Goals Met:  ?Independence with exercise equipment ?Exercise tolerated well ?No report of concerns or symptoms today ?Strength training completed today ? ?Goals Unmet:  ?Not Applicable ? ?Comments: Pt able to follow exercise prescription today without complaint.  Will continue to monitor for progression. ? ? ? ?Dr. Emily Filbert is Medical Director for [place].  ?Dr. Ottie Glazier is Medical Director for [facility] Pulmonary Rehabilitation. ?

## 2021-08-09 ENCOUNTER — Other Ambulatory Visit: Payer: Self-pay

## 2021-08-09 ENCOUNTER — Encounter: Payer: Self-pay | Admitting: *Deleted

## 2021-08-09 DIAGNOSIS — Z952 Presence of prosthetic heart valve: Secondary | ICD-10-CM

## 2021-08-09 NOTE — Progress Notes (Signed)
Daily Session Note ? ?Patient Details  ?Name: [name] ?MRN: [id number] ?Date of Birth: 1975-06-14 ?Referring Provider:   ?Flowsheet Row Cardiac Rehab from 06/19/2021 in [facility] Cardiac and Pulmonary Rehab  ?Referring Provider Dr. Virl Axe  ? ?  ? ? ?Encounter Date: 08/09/2021 ? ?Check In: ? Session Check In - 08/09/21 1727   ? ?  ? Check-In  ? Supervising physician immediately available to respond to emergencies See telemetry face sheet for immediately available ER MD   ? Location [facility]-Cardiac & Pulmonary Rehab   ? Staff Present Birdie Sons, MPA, Nino Glow, MS, ASCM CEP, Exercise Physiologist;Joseph [place], [place]   ? Virtual Visit No   ? Medication changes reported     No   ? Fall or balance concerns reported    No   ? Warm-up and Cool-down Performed on first and last piece of equipment   ? Resistance Training Performed Yes   ? VAD Patient? No   ? PAD/SET Patient? No   ?  ? Pain Assessment  ? Currently in Pain? No/denies   ? ?  ?  ? ?  ? ? ? ? ? ?Social History  ? ?Tobacco Use  ?Smoking Status Never  ?Smokeless Tobacco Never  ? ? ?Goals Met:  ?Independence with exercise equipment ?Exercise tolerated well ?No report of concerns or symptoms today ?Strength training completed today ? ?Goals Unmet:  ?Not Applicable ? ?Comments: Pt able to follow exercise prescription today without complaint.  Will continue to monitor for progression. ? ? ? ?Dr. Emily Filbert is Medical Director for [place].  ?Dr. Ottie Glazier is Medical Director for [facility] Pulmonary Rehabilitation. ?

## 2021-08-09 NOTE — Progress Notes (Signed)
Cardiac Individual Treatment Plan ? ?Patient Details  ?Name: [name] ?MRN: [id number] ?Date of Birth: 07/05/1975 ?Referring Provider:   ?Flowsheet Row Cardiac Rehab from 06/19/2021 in [facility] Cardiac and Pulmonary Rehab  ?Referring Provider Dr. Virl Axe  ? ?  ? ? ?Initial Encounter Date:  ?Flowsheet Row Cardiac Rehab from 06/19/2021 in [facility] Cardiac and Pulmonary Rehab  ?Date 06/19/21  ? ?  ? ? ?Visit Diagnosis: S/P AVR (aortic valve replacement) ? ?Patient's Home Medications on Admission: ? ?Current Outpatient Medications:  ?  acetaminophen (TYLENOL) 500 MG tablet, Take 500 mg by mouth every 6 (six) hours as needed for moderate pain., Disp: , Rfl:  ?  aspirin EC 81 MG EC tablet, Take 1 tablet (81 mg total) by mouth daily. Swallow whole., Disp: 30 tablet, Rfl: 11 ?  carvedilol (COREG) 6.25 MG tablet, Take 1 tablet (6.25 mg total) by mouth 2 (two) times daily., Disp: 180 tablet, Rfl: 3 ?  fluticasone (FLONASE) 50 MCG/ACT nasal spray, Place 2 sprays into both nostrils daily as needed for allergies., Disp: , Rfl:  ?  fluticasone (FLONASE) 50 MCG/ACT nasal spray, Place into the nose., Disp: , Rfl:  ?  loratadine (CLARITIN) 10 MG tablet, Take by mouth as needed., Disp: , Rfl:  ?  magnesium oxide (MAG-OX) 400 MG tablet, Take 1 tablet (400 mg total) by mouth 2 (two) times daily., Disp: 180 tablet, Rfl: 3 ?  meclizine (ANTIVERT) 25 MG tablet, Take 0.5-1 tablets (12.5-25 mg total) by mouth 3 (three) times daily as needed for dizziness., Disp: 30 tablet, Rfl: 2 ?  Multiple Vitamin (MULTIVITAMIN) tablet, Take 2 tablets by mouth daily., Disp: , Rfl:  ?  ondansetron (ZOFRAN-ODT) 4 MG disintegrating tablet, Take by mouth as needed., Disp: , Rfl:  ?  oxyCODONE (OXY IR/ROXICODONE) 5 MG immediate release tablet, Take 1 tablet (5 mg total) by mouth every 4 (four) hours as needed for severe pain., Disp: 30 tablet, Rfl: 0 ?  potassium chloride SA (KLOR-CON M) 20 MEQ tablet, Take 1 tablet (20 mEq total) by mouth daily. For 4 days  then stop., Disp: 4 tablet, Rfl: 0 ?  ranolazine (RANEXA) 1000 MG SR tablet, Take 1 tablet (1,000 mg total) by mouth 2 (two) times daily., Disp: 180 tablet, Rfl: 3 ?  sacubitril-valsartan (ENTRESTO) 24-26 MG, Take 1 tablet by mouth 2 (two) times daily., Disp: 180 tablet, Rfl: 3 ?  spironolactone (ALDACTONE) 25 MG tablet, Take 0.5 tablets (12.5 mg total) by mouth daily., Disp: 45 tablet, Rfl: 3 ?  torsemide (DEMADEX) 20 MG tablet, Take 1 tablet (20 mg total) by mouth daily. For 4 days then stop., Disp: 4 tablet, Rfl: 0 ?  triamcinolone cream (KENALOG) 0.1 %, Apply 1 application topically 2 (two) times daily as needed., Disp: 30 g, Rfl: 0 ?  triamcinolone cream (KENALOG) 0.1 %, Apply topically as needed., Disp: , Rfl:  ?  umeclidinium bromide (INCRUSE ELLIPTA) 62.5 MCG/ACT AEPB, Inhale 1 puff into the lungs as needed., Disp: , Rfl:  ?  warfarin (COUMADIN) 5 MG tablet, TAKE 1 AND 1/2 TABLETS BY MOUTH DAILY OR AS DIRECTED BY THE COUMADIN CLINIC, Disp: 50 tablet, Rfl: 3 ? ?Past Medical History: ?Past Medical History:  ?Diagnosis Date  ? AICD (automatic cardioverter/defibrillator) present   ? Anxiety   ? Aortic valve replaced   ? Requiring replacement, specifics not available  ? Complication of anesthesia   ? N & V  ? Dysrhythmia   ? Hearing loss of both ears 12/22/2015  ?  Heart murmur   ? Hypertension   ? IBD (inflammatory bowel disease) 09/23/2015  ? ICD (implantable cardiac defibrillator), BSX single   ? Myocardial infarction [facility])   ? Obesity 12/22/2015  ? PONV (postoperative nausea and vomiting)   ? Secondary cardiomyopathy ([place])   ? Syncope   ? Ventricular septal defect   ? Ventricular tachycardia   ? appropriate VT shock therapy /13  ? ? ?Tobacco Use: ?Social History  ? ?Tobacco Use  ?Smoking Status Never  ?Smokeless Tobacco Never  ? ? ?Labs: ?Review Flowsheet   ? ?  ?  Latest Ref Rng & Units 03/01/2021 04/25/2021 04/27/2021 04/28/2021  ?Labs for ITP Cardiac and Pulmonary Rehab  ?Cholestrol <200 mg/dL      ?LDL (calc)  mg/dL (calc)      ?HDL-C > OR = 40 mg/dL      ?Trlycerides <150 mg/dL      ?Hemoglobin A1c 4.8 - 5.6 %  4.5      ?PH, Arterial 7.350 - 7.450  7.391   7.386    ? 7.413    ? 7.408    ? 7.415    ? 7.388    ? 7.374    ? 7.425    ? 7.376   7.334    ? 7.334    ?PCO2 arterial 32.0 - 48.0 mmHg  43.0   43.8    ? 37.0    ? 39.2    ? 40.3    ? 43.1    ? 38.4    ? 40.1    ? 46.4   48.6    ? 47.0    ?Bicarbonate 20.0 - 28.0 mmol/L 27.3    ? 28.1   25.6   26.3    ? 24.3    ? 24.7    ? 25.9    ? 26.0    ? 22.4    ? 24.7    ? 26.3    ? 27.2   25.9    ? 25.0    ?TCO2 22 - 32 mmol/L 29    ? 30    28    ? 26    ? 26    ? 25    ? 27    ? 26    ? 27    ? 24    ? 24    ? 25    ? 26    ? 28    ? 25    ? 29    ? 29   27    ? 26    ?Acid-base deficit 0.0 - 2.0 mmol/L   1.0    ? 3.0   1.0    ?O2 Saturation % 72.0    ? 77.0   98.5   100.0    ? 100.0    ? 100.0    ? 100.0    ? 100.0    ? 100.0    ? 88.0    ? 100.0    ? 100.0   98.0    ? 96.0    ? ?  07/11/2021  ?Labs for ITP Cardiac and Pulmonary Rehab  ?Cholestrol 174    ?LDL (calc) 102    ?HDL-C 45    ?Trlycerides 174    ?Hemoglobin A1c   ?PH, Arterial   ?PCO2 arterial   ?Bicarbonate   ?TCO2   ?Acid-base deficit   ?O2 Saturation   ?  ? ? Multiple values from one  day are sorted in reverse-chronological order  ?  ?  ? ? ? ?Exercise Target Goals: ?Exercise Program Goal: ?Individual exercise prescription set using results from initial 6 min walk test and THRR while considering  patient?s activity barriers and safety.  ? ?Exercise Prescription Goal: ?Initial exercise prescription builds to 30-45 minutes a day of aerobic activity, 2-3 days per week.  Home exercise guidelines will be given to patient during program as part of exercise prescription that the participant will acknowledge. ? ? ?Education: Aerobic Exercise: ?- Group verbal and visual presentation on the components of exercise prescription. Introduces F.I.T.T principle from ACSM for exercise prescriptions.  Reviews F.I.T.T. principles of  aerobic exercise including progression. Written material given at graduation. ? ? ?Education: Resistance Exercise: ?- Group verbal and visual presentation on the components of exercise prescription. Introduces F.I.T.T principle from ACSM for exercise prescriptions  Reviews F.I.T.T. principles of resistance exercise including progression. Written material given at graduation. ? ?  ?Education: Exercise & Equipment Safety: ?- Individual verbal instruction and demonstration of equipment use and safety with use of the equipment. ?Flowsheet Row Cardiac Rehab from 06/21/2021 in [facility] Cardiac and Pulmonary Rehab  ?Date 06/19/21  ?Educator [place]  ?Instruction Review Code 1- Verbalizes Understanding  ? ?  ? ? ?Education: Exercise Physiology & General Exercise Guidelines: ?- Group verbal and written instruction with models to review the exercise physiology of the cardiovascular system and associated critical values. Provides general exercise guidelines with specific guidelines to those with heart or lung disease.  ? ? ?Education: Flexibility, Balance, Mind/Body Relaxation: ?- Group verbal and visual presentation with interactive activity on the components of exercise prescription. Introduces F.I.T.T principle from ACSM for exercise prescriptions. Reviews F.I.T.T. principles of flexibility and balance exercise training including progression. Also discusses the mind body connection.  Reviews various relaxation techniques to help reduce and manage stress (i.e. Deep breathing, progressive muscle relaxation, and visualization). Balance handout provided to take home. Written material given at graduation. ? ? ?Activity Barriers & Risk Stratification: ? ? ?6 Minute Walk: ? 6 Minute Walk   ? ? [place] Name 06/19/21 1057  ?  ?  ?  ? 6 Minute Walk  ? Phase Initial    ? Distance 1300 feet    ? Walk Time 6 minutes    ? # of Rest Breaks 0    ? MPH 2.46    ? METS 4.2    ? RPE 7    ? VO2 Peak 14.7    ? Symptoms No    ? Resting HR 80 bpm    ? Resting BP  100/70    ? Resting Oxygen Saturation  96 %    ? Exercise Oxygen Saturation  during 6 min walk 99 %    ? Max Ex. HR 100 bpm    ? Max Ex. BP 124/78    ? 2 Minute Post BP 104/64    ? ?  ?  ? ?  ? ? ?Oxyg

## 2021-08-10 ENCOUNTER — Encounter: Payer: 59 | Admitting: *Deleted

## 2021-08-10 ENCOUNTER — Ambulatory Visit (INDEPENDENT_AMBULATORY_CARE_PROVIDER_SITE_OTHER): Payer: 59

## 2021-08-10 DIAGNOSIS — Z952 Presence of prosthetic heart valve: Secondary | ICD-10-CM | POA: Diagnosis not present

## 2021-08-10 DIAGNOSIS — I472 Ventricular tachycardia, unspecified: Secondary | ICD-10-CM

## 2021-08-10 DIAGNOSIS — I5022 Chronic systolic (congestive) heart failure: Secondary | ICD-10-CM

## 2021-08-10 NOTE — Progress Notes (Signed)
Daily Session Note ? ?Patient Details  ?Name: [name] ?MRN: [id number] ?Date of Birth: 1975-07-15 ?Referring Provider:   ?Flowsheet Row Cardiac Rehab from 06/19/2021 in [facility] Cardiac and Pulmonary Rehab  ?Referring Provider Dr. Virl Axe  ? ?  ? ? ?Encounter Date: 08/10/2021 ? ?Check In: ? Session Check In - 08/10/21 1717   ? ?  ? Check-In  ? Supervising physician immediately available to respond to emergencies See telemetry face sheet for immediately available ER MD   ? Location [facility]-Cardiac & Pulmonary Rehab   ? Staff Present Renita Papa, RN BSN;Joseph [place], RCP,RRT,BSRT;Kara [place], [place], ASCM CEP, Exercise Physiologist   ? Virtual Visit No   ? Medication changes reported     No   ? Fall or balance concerns reported    No   ? Warm-up and Cool-down Performed on first and last piece of equipment   ? Resistance Training Performed Yes   ? VAD Patient? No   ? PAD/SET Patient? No   ?  ? Pain Assessment  ? Currently in Pain? No/denies   ? ?  ?  ? ?  ? ? ? ? ? ?Social History  ? ?Tobacco Use  ?Smoking Status Never  ?Smokeless Tobacco Never  ? ? ?Goals Met:  ?Independence with exercise equipment ?Exercise tolerated well ?No report of concerns or symptoms today ?Strength training completed today ? ?Goals Unmet:  ?Not Applicable ? ?Comments: Pt able to follow exercise prescription today without complaint.  Will continue to monitor for progression. ? ? ? ?Dr. Emily Filbert is Medical Director for [place].  ?Dr. Ottie Glazier is Medical Director for [facility] Pulmonary Rehabilitation. ?

## 2021-08-11 LAB — CUP PACEART REMOTE DEVICE CHECK
Battery Remaining Longevity: 66 mo
Battery Remaining Percentage: 66 %
Brady Statistic RV Percent Paced: 0 %
Date Time Interrogation Session: 20230323030100
HighPow Impedance: 52 Ohm
Implantable Lead Implant Date: 20080214
Implantable Lead Location: 753860
Implantable Lead Model: 185
Implantable Lead Serial Number: 178017
Implantable Pulse Generator Implant Date: 20141216
Lead Channel Impedance Value: 651 Ohm
Lead Channel Pacing Threshold Amplitude: 1.1 V
Lead Channel Pacing Threshold Pulse Width: 0.4 ms
Lead Channel Setting Pacing Amplitude: 2 V
Lead Channel Setting Pacing Pulse Width: 0.4 ms
Lead Channel Setting Sensing Sensitivity: 0.6 mV
Pulse Gen Serial Number: 126781

## 2021-08-14 ENCOUNTER — Encounter: Payer: 59 | Admitting: *Deleted

## 2021-08-14 ENCOUNTER — Other Ambulatory Visit: Payer: Self-pay

## 2021-08-14 DIAGNOSIS — Z952 Presence of prosthetic heart valve: Secondary | ICD-10-CM | POA: Diagnosis not present

## 2021-08-14 NOTE — Progress Notes (Signed)
Daily Session Note ? ?Patient Details  ?Name: SONG GARRIS ?MRN: [id number] ?Date of Birth: June 08, 1975 ?Referring Provider:   ?Flowsheet Row Cardiac Rehab from 06/19/2021 in [facility] Cardiac and Pulmonary Rehab  ?Referring Provider Dr. Virl Axe  ? ?  ? ? ?Encounter Date: 08/14/2021 ? ?Check In: ? Session Check In - 08/14/21 1736   ? ?  ? Check-In  ? Supervising physician immediately available to respond to emergencies See telemetry face sheet for immediately available ER MD   ? Location [facility]-Cardiac & Pulmonary Rehab   ? Staff Present Renita Papa, RN BSN;Joseph [place], RCP,RRT,BSRT;Kara [place], [place], ASCM CEP, Exercise Physiologist   ? Virtual Visit No   ? Medication changes reported     No   ? Fall or balance concerns reported    No   ? Warm-up and Cool-down Performed on first and last piece of equipment   ? Resistance Training Performed Yes   ? VAD Patient? No   ? PAD/SET Patient? No   ?  ? Pain Assessment  ? Currently in Pain? No/denies   ? ?  ?  ? ?  ? ? ? ? ? ?Social History  ? ?Tobacco Use  ?Smoking Status Never  ?Smokeless Tobacco Never  ? ? ?Goals Met:  ?Independence with exercise equipment ?Exercise tolerated well ?No report of concerns or symptoms today ?Strength training completed today ? ?Goals Unmet:  ?Not Applicable ? ?Comments: Pt able to follow exercise prescription today without complaint.  Will continue to monitor for progression. ? ? ? ?Dr. Emily Filbert is Medical Director for [place].  ?Dr. Ottie Glazier is Medical Director for [facility] Pulmonary Rehabilitation. ?

## 2021-08-15 ENCOUNTER — Ambulatory Visit (INDEPENDENT_AMBULATORY_CARE_PROVIDER_SITE_OTHER): Payer: 59

## 2021-08-15 DIAGNOSIS — I350 Nonrheumatic aortic (valve) stenosis: Secondary | ICD-10-CM | POA: Diagnosis not present

## 2021-08-15 DIAGNOSIS — Z7901 Long term (current) use of anticoagulants: Secondary | ICD-10-CM

## 2021-08-15 DIAGNOSIS — Z952 Presence of prosthetic heart valve: Secondary | ICD-10-CM

## 2021-08-15 LAB — POCT INR: INR: 2.1 (ref 2.0–3.0)

## 2021-08-15 NOTE — Patient Instructions (Signed)
Description   ?Continue taking warfarin 1.5 tablets daily except 2 tablets every Wednesdays. Recheck INR in 4 weeks. Per Dr Vivi Martens note 07/05/21 pt has ON-X valve placed 04/27/21, after 3 months can decrease INR goal 1.5-2.0 with '81mg'$  ASA.  Be consistent with green (2 times per week)  ?Coumadin Clinic [phone]  ?  ?  ?

## 2021-08-16 DIAGNOSIS — Z952 Presence of prosthetic heart valve: Secondary | ICD-10-CM

## 2021-08-16 NOTE — Progress Notes (Signed)
Daily Session Note ? ?Patient Details  ?Name: [name] ?MRN: [id number] ?Date of Birth: 07-30-1975 ?Referring Provider:   ?Flowsheet Row Cardiac Rehab from 06/19/2021 in [facility] Cardiac and Pulmonary Rehab  ?Referring Provider Dr. Virl Axe  ? ?  ? ? ?Encounter Date: 08/16/2021 ? ?Check In: ? Session Check In - 08/16/21 1720   ? ?  ? Check-In  ? Supervising physician immediately available to respond to emergencies See telemetry face sheet for immediately available ER MD   ? Location [facility]-Cardiac & Pulmonary Rehab   ? Staff Present Birdie Sons, MPA, RN;Joseph [place], [place], MS, ASCM CEP, Exercise Physiologist;Melissa Tilford Pillar, RDN, LDN   ? Virtual Visit No   ? Medication changes reported     No   ? Fall or balance concerns reported    No   ? Warm-up and Cool-down Performed on first and last piece of equipment   ? Resistance Training Performed Yes   ? VAD Patient? No   ? PAD/SET Patient? No   ?  ? Pain Assessment  ? Currently in Pain? No/denies   ? ?  ?  ? ?  ? ? ? ? ? ?Social History  ? ?Tobacco Use  ?Smoking Status Never  ?Smokeless Tobacco Never  ? ? ?Goals Met:  ?Independence with exercise equipment ?Exercise tolerated well ?No report of concerns or symptoms today ?Strength training completed today ? ?Goals Unmet:  ?Not Applicable ? ?Comments: Pt able to follow exercise prescription today without complaint.  Will continue to monitor for progression. ? ? ? ?Dr. Emily Filbert is Medical Director for [place].  ?Dr. Ottie Glazier is Medical Director for [facility] Pulmonary Rehabilitation. ?

## 2021-08-17 ENCOUNTER — Encounter: Payer: 59 | Admitting: *Deleted

## 2021-08-17 DIAGNOSIS — Z952 Presence of prosthetic heart valve: Secondary | ICD-10-CM | POA: Diagnosis not present

## 2021-08-17 NOTE — Progress Notes (Signed)
Daily Session Note  Patient Details  Name: [name] MRN: [id number] Date of Birth: 12/26/1975 Referring Provider:   Flowsheet Row Cardiac Rehab from 06/19/2021 in [facility] Cardiac and Pulmonary Rehab  Referring Provider Dr. Virl Axe       Encounter Date: 08/17/2021  Check In:  Session Check In - 08/17/21 [place]       Check-In   Supervising physician immediately available to respond to emergencies See telemetry face sheet for immediately available ER MD    Location [facility]-Cardiac & Pulmonary Rehab    Staff Present Renita Papa, RN BSN;Joseph [place], RCP,RRT,BSRT;Amanda Shaniko, [place], ACSM CEP, Exercise Physiologist    Virtual Visit No    Medication changes reported     No    Fall or balance concerns reported    No    Warm-up and Cool-down Performed on first and last piece of equipment    Resistance Training Performed Yes    VAD Patient? No    PAD/SET Patient? No      Pain Assessment   Currently in Pain? No/denies                Social History   Tobacco Use  Smoking Status Never  Smokeless Tobacco Never    Goals Met:  Independence with exercise equipment Exercise tolerated well No report of concerns or symptoms today Strength training completed today  Goals Unmet:  Not Applicable  Comments: Pt able to follow exercise prescription today without complaint.  Will continue to monitor for progression.    Dr. Emily Filbert is Medical Director for [place].  Dr. Ottie Glazier is Medical Director for [facility] Pulmonary Rehabilitation.

## 2021-08-18 NOTE — Progress Notes (Signed)
Remote ICD transmission.   

## 2021-08-21 ENCOUNTER — Encounter: Payer: 59 | Attending: Internal Medicine

## 2021-08-21 DIAGNOSIS — Z952 Presence of prosthetic heart valve: Secondary | ICD-10-CM | POA: Insufficient documentation

## 2021-08-24 ENCOUNTER — Encounter: Payer: 59 | Admitting: *Deleted

## 2021-08-24 ENCOUNTER — Encounter: Payer: Self-pay | Admitting: Pharmacist

## 2021-08-24 DIAGNOSIS — Z952 Presence of prosthetic heart valve: Secondary | ICD-10-CM

## 2021-08-24 NOTE — Progress Notes (Signed)
Daily Session Note ? ?Patient Details  ?Name: [name] ?MRN: [id number] ?Date of Birth: 02/07/1976 ?Referring Provider:   ?Flowsheet Row Cardiac Rehab from 06/19/2021 in [facility] Cardiac and Pulmonary Rehab  ?Referring Provider Dr. Virl Axe  ? ?  ? ? ?Encounter Date: 08/24/2021 ? ?Check In: ? Session Check In - 08/24/21 1718   ? ?  ? Check-In  ? Supervising physician immediately available to respond to emergencies See telemetry face sheet for immediately available ER MD   ? Location [facility]-Cardiac & Pulmonary Rehab   ? Staff Present Renita Papa, RN BSN;Joseph [place], RCP,RRT,BSRT;Kara [place], [place], ASCM CEP, Exercise Physiologist   ? Virtual Visit No   ? Medication changes reported     No   ? Fall or balance concerns reported    No   ? Warm-up and Cool-down Performed on first and last piece of equipment   ? Resistance Training Performed Yes   ? VAD Patient? No   ? PAD/SET Patient? No   ?  ? Pain Assessment  ? Currently in Pain? No/denies   ? ?  ?  ? ?  ? ? ? ? ? ?Social History  ? ?Tobacco Use  ?Smoking Status Never  ?Smokeless Tobacco Never  ? ? ?Goals Met:  ?Independence with exercise equipment ?Exercise tolerated well ?No report of concerns or symptoms today ?Strength training completed today ? ?Goals Unmet:  ?Not Applicable ? ?Comments: Pt able to follow exercise prescription today without complaint.  Will continue to monitor for progression. ? ? ? ?Dr. Emily Filbert is Medical Director for [place].  ?Dr. Ottie Glazier is Medical Director for [facility] Pulmonary Rehabilitation. ?

## 2021-08-28 ENCOUNTER — Encounter: Payer: 59 | Admitting: *Deleted

## 2021-08-28 DIAGNOSIS — Z952 Presence of prosthetic heart valve: Secondary | ICD-10-CM

## 2021-08-28 NOTE — Progress Notes (Signed)
Daily Session Note ? ?Patient Details  ?Name: [name] ?MRN: [id number] ?Date of Birth: [birth year] ?Referring Provider:   ?Flowsheet Row Cardiac Rehab from 06/19/2021 in [facility] Cardiac and Pulmonary Rehab  ?Referring Provider Dr. Virl Axe  ? ?  ? ? ?Encounter Date: 08/28/2021 ? ?Check In: ? Session Check In - 08/28/21 1721   ? ?  ? Check-In  ? Supervising physician immediately available to respond to emergencies See telemetry face sheet for immediately available ER MD   ? Location [facility]-Cardiac & Pulmonary Rehab   ? Staff Present Renita Papa, RN BSN;Joseph [place], RCP,RRT,BSRT;Melissa [place], [place], LDN   ? Virtual Visit No   ? Medication changes reported     No   ? Fall or balance concerns reported    No   ? Warm-up and Cool-down Performed on first and last piece of equipment   ? Resistance Training Performed Yes   ? VAD Patient? No   ? PAD/SET Patient? No   ?  ? Pain Assessment  ? Currently in Pain? No/denies   ? ?  ?  ? ?  ? ? ? ? ? ?Social History  ? ?Tobacco Use  ?Smoking Status Never  ?Smokeless Tobacco Never  ? ? ?Goals Met:  ?Independence with exercise equipment ?Exercise tolerated well ?No report of concerns or symptoms today ?Strength training completed today ? ?Goals Unmet:  ?Not Applicable ? ?Comments: Pt able to follow exercise prescription today without complaint.  Will continue to monitor for progression. ? ? ?Dr. Emily Filbert is Medical Director for [place].  ?Dr. Ottie Glazier is Medical Director for [facility] Pulmonary Rehabilitation. ?

## 2021-08-30 ENCOUNTER — Encounter: Payer: 59 | Admitting: *Deleted

## 2021-08-30 DIAGNOSIS — Z952 Presence of prosthetic heart valve: Secondary | ICD-10-CM | POA: Diagnosis not present

## 2021-08-30 NOTE — Progress Notes (Signed)
Daily Session Note ? ?Patient Details  ?Name: [name] ?MRN: [id number] ?Date of Birth: 1976-04-07 ?Referring Provider:   ?Flowsheet Row Cardiac Rehab from 06/19/2021 in [facility] Cardiac and Pulmonary Rehab  ?Referring Provider Dr. Virl Axe  ? ?  ? ? ?Encounter Date: 08/30/2021 ? ?Check In: ? Session Check In - 08/30/21 1739   ? ?  ? Check-In  ? Supervising physician immediately available to respond to emergencies See telemetry face sheet for immediately available ER MD   ? Location [facility]-Cardiac & Pulmonary Rehab   ? Staff Present Renita Papa, RN BSN;Melissa [place], RDN, LDN;Mary Kellie Shropshire, RN, BSN, Tyna Jaksch, MS, ASCM CEP, Exercise Physiologist   ? Virtual Visit No   ? Medication changes reported     No   ? Fall or balance concerns reported    No   ? Warm-up and Cool-down Performed on first and last piece of equipment   ? Resistance Training Performed Yes   ? VAD Patient? No   ? PAD/SET Patient? No   ?  ? Pain Assessment  ? Currently in Pain? No/denies   ? ?  ?  ? ?  ? ? ? ? ? ?Social History  ? ?Tobacco Use  ?Smoking Status Never  ?Smokeless Tobacco Never  ? ? ?Goals Met:  ?Independence with exercise equipment ?Exercise tolerated well ?No report of concerns or symptoms today ?Strength training completed today ? ?Goals Unmet:  ?Not Applicable ? ?Comments: Pt able to follow exercise prescription today without complaint.  Will continue to monitor for progression. ? ? ? ?Dr. Emily Filbert is Medical Director for [place].  ?Dr. Ottie Glazier is Medical Director for [facility] Pulmonary Rehabilitation. ?

## 2021-08-31 ENCOUNTER — Encounter: Payer: 59 | Admitting: *Deleted

## 2021-08-31 DIAGNOSIS — Z952 Presence of prosthetic heart valve: Secondary | ICD-10-CM | POA: Diagnosis not present

## 2021-08-31 NOTE — Progress Notes (Signed)
Daily Session Note ? ?Patient Details  ?Name: [name] ?MRN: [id number] ?Date of Birth: Feb 04, 1976 ?Referring Provider:   ?Flowsheet Row Cardiac Rehab from 06/19/2021 in [facility] Cardiac and Pulmonary Rehab  ?Referring Provider Dr. Virl Axe  ? ?  ? ? ?Encounter Date: 08/31/2021 ? ?Check In: ? Session Check In - 08/31/21 1718   ? ?  ? Check-In  ? Supervising physician immediately available to respond to emergencies See telemetry face sheet for immediately available ER MD   ? Location [facility]-Cardiac & Pulmonary Rehab   ? Staff Present Renita Papa, RN BSN;Joseph Hood, RCP,RRT,BSRT;Laureen Brandon, [place], RRT, CPFT   ? Virtual Visit No   ? Medication changes reported     No   ? Fall or balance concerns reported    No   ? Warm-up and Cool-down Performed on first and last piece of equipment   ? Resistance Training Performed Yes   ? VAD Patient? No   ? PAD/SET Patient? No   ?  ? Pain Assessment  ? Currently in Pain? No/denies   ? ?  ?  ? ?  ? ? ? ? ? ?Social History  ? ?Tobacco Use  ?Smoking Status Never  ?Smokeless Tobacco Never  ? ? ?Goals Met:  ?Independence with exercise equipment ?Exercise tolerated well ?No report of concerns or symptoms today ?Strength training completed today ? ?Goals Unmet:  ?Not Applicable ? ?Comments: Pt able to follow exercise prescription today without complaint.  Will continue to monitor for progression. ? ? ? ?Dr. Emily Filbert is Medical Director for [place].  ?Dr. Ottie Glazier is Medical Director for [facility] Pulmonary Rehabilitation. ?

## 2021-09-04 NOTE — Progress Notes (Signed)
?Cardiology Office Note:   ? ?Date:  09/06/2021  ? ?ID:  [name], DOB October 27, 1975, MRN [id number] ? ?PCP:  Delsa Grana, PA-C ?  ?[facility] Providers ?Cardiologist:  Sherren Mocha, MD ?Electrophysiologist:  Virl Axe, MD    ? ?Referring MD: Delsa Grana, PA-C  ? ?Chief Complaint: follow-up AVR ? ? ?History of Present Illness:   ? ?[name] is a very pleasant 46 y.o. male with a hx of valvular heart disease and congestive heart failure. open aortic valve replacement, ventricular tachycardia, PAF, hypertension,  ? ?As a teenager was found to have a VSD on a sports physical.  He underwent surgical VSD repair at 46 years old ([facility]).  Was then found to have an aortic valve problem at age 63 and underwent bioprosthetic aortic valve replacement ([place]).  During that surgery there was an injury to the RCA and he had an infarct.  Was found to have episodes of VT in 2008 and was treated with medical therapy and an ICD.  Has had a few appropriate shocks for VT over the years and has had one ATP therapy delivered as well. Referral to [facility] for consideration of VT ablation, thought to be epicardial with mapping of the CS complicated by dissection, case aborted.  Has experienced progressive bioprosthetic aortic valve dysfunction with severe prosthetic valve stenosis and moderately severe LV systolic dysfunction.  Ultimately treated with third time redo median sternotomy and AVR using a 23 mm ON-X mechanical valve on 04/27/2021. Post-op course uncomplicated.  ? ?Seen in our office by Dr. Burt Knack on 06/06/21 at which time spironolactone was added and follow-up with Pharm D was recommended. Echo revealed normal aortic valve function, LVEF 30-35%, severe ateroseptal, inferoseptal and inferolateral HK and basal to mid inferior akinesis, mild LVH, G1 DD, mildly reduced RV function. Spironolactone was reduced to 12.5 mg. He was cleared by Dr. Cyndia Bent at office visit on 07/05/21 and was  enrolled in cardiac rehab. Device function normal at ov with Dr. Caryl Comes 07/11/21 and 1 yr f/u recommended.  ? ?Today, he is here alone for follow-up.  States he is feeling well, energy level is good, doing well in cardiac rehab. Home BP and pulse readings are well-controlled.  Notes that heart rate generally is faster, and range from 80s to 90s which is higher than prior to surgery. He denies chest pain, shortness of breath, lower extremity edema, fatigue, palpitations, melena, hematuria, hemoptysis, diaphoresis, weakness, presyncope, syncope, orthopnea, and PND. Sternotomy incision appears to be in appropriate stages of healing, mild tenderness to touch. States he has mild discomfort with weight lifting.  He has no specific cardiac complaints today. ? ? ?Past Medical History:  ?Diagnosis Date  ? AICD (automatic cardioverter/defibrillator) present   ? Anxiety   ? Aortic valve replaced   ? Requiring replacement, specifics not available  ? Complication of anesthesia   ? N & V  ? Dysrhythmia   ? Hearing loss of both ears 12/22/2015  ? Heart murmur   ? Hypertension   ? IBD (inflammatory bowel disease) 09/23/2015  ? ICD (implantable cardiac defibrillator), BSX single   ? Myocardial infarction [facility])   ? Obesity 12/22/2015  ? PONV (postoperative nausea and vomiting)   ? Secondary cardiomyopathy ([place])   ? Syncope   ? Ventricular septal defect   ? Ventricular tachycardia ([place])   ? appropriate VT shock therapy /13  ? ? ?Past Surgical History:  ?Procedure Laterality Date  ? ANGIOPLASTY    ?  RCA repair with vein angioplasty following injury with the aforementioned surgery  ? AORTIC VALVE REPLACEMENT    ? Bioprosthesis  ? AORTIC VALVE REPLACEMENT N/A 04/27/2021  ? Procedure: REDO AORTIC VALVE REPLACEMENT (AVR) USING ON-X 23MM AORTIC VALVE;  Surgeon: Gaye Pollack, MD;  Location: [place] OR;  Service: Open Heart Surgery;  Laterality: N/A;  ? CARDIAC CATHETERIZATION    ? CARDIAC DEFIBRILLATOR PLACEMENT    ? CARDIAC VALVE REPLACEMENT     ? CHOLECYSTECTOMY    ? COLONOSCOPY WITH PROPOFOL N/A 12/06/2015  ? Procedure: COLONOSCOPY WITH PROPOFOL;  Surgeon: [name], MD;  Location: [facility] ENDOSCOPY;  Service: Endoscopy;  Laterality: N/A;  ? CORONARY ANGIOPLASTY    ? CORONARY ARTERY BYPASS GRAFT    ? IMPLANTABLE CARDIOVERTER DEFIBRILLATOR GENERATOR CHANGE N/A 05/05/2013  ? Procedure: IMPLANTABLE CARDIOVERTER DEFIBRILLATOR GENERATOR CHANGE;  Surgeon: Deboraha Sprang, MD;  Location: [facility] CATH LAB;  Service: Cardiovascular;  Laterality: N/A;  ? INSERT / REPLACE / REMOVE PACEMAKER    ? RIGHT/LEFT HEART CATH AND CORONARY ANGIOGRAPHY N/A 03/01/2021  ? Procedure: RIGHT/LEFT HEART CATH AND CORONARY ANGIOGRAPHY;  Surgeon: Sherren Mocha, MD;  Location: [place] CV LAB;  Service: Cardiovascular;  Laterality: N/A;  ? TEE WITHOUT CARDIOVERSION N/A 04/27/2021  ? Procedure: TRANSESOPHAGEAL ECHOCARDIOGRAM (TEE);  Surgeon: Gaye Pollack, MD;  Location: [place];  Service: Open Heart Surgery;  Laterality: N/A;  ? VSD REPAIR    ? ? ?Current Medications: ?Current Meds  ?Medication Sig  ? acetaminophen (TYLENOL) 500 MG tablet Take 500 mg by mouth every 6 (six) hours as needed for moderate pain.  ? aspirin EC 81 MG EC tablet Take 1 tablet (81 mg total) by mouth daily. Swallow whole.  ? carvedilol (COREG) 6.25 MG tablet Take 1 tablet (6.25 mg total) by mouth 2 (two) times daily.  ? empagliflozin (JARDIANCE) 10 MG TABS tablet Take 1 tablet (10 mg total) by mouth daily before breakfast.  ? fluticasone (FLONASE) 50 MCG/ACT nasal spray Place 2 sprays into both nostrils daily as needed for allergies.  ? fluticasone (FLONASE) 50 MCG/ACT nasal spray Place into the nose.  ? loratadine (CLARITIN) 10 MG tablet Take by mouth as needed.  ? magnesium oxide (MAG-OX) 400 MG tablet Take 1 tablet (400 mg total) by mouth 2 (two) times daily.  ? meclizine (ANTIVERT) 25 MG tablet Take 0.5-1 tablets (12.5-25 mg total) by mouth 3 (three) times daily as needed for dizziness.  ? Multiple Vitamin  (MULTIVITAMIN) tablet Take 2 tablets by mouth daily.  ? ondansetron (ZOFRAN-ODT) 4 MG disintegrating tablet Take by mouth as needed.  ? oxyCODONE (OXY IR/ROXICODONE) 5 MG immediate release tablet Take 1 tablet (5 mg total) by mouth every 4 (four) hours as needed for severe pain.  ? potassium chloride SA (KLOR-CON M) 20 MEQ tablet Take 1 tablet (20 mEq total) by mouth daily. For 4 days then stop.  ? ranolazine (RANEXA) 1000 MG SR tablet Take 1 tablet (1,000 mg total) by mouth 2 (two) times daily.  ? sacubitril-valsartan (ENTRESTO) 24-26 MG Take 1 tablet by mouth 2 (two) times daily.  ? spironolactone (ALDACTONE) 25 MG tablet Take 0.5 tablets (12.5 mg total) by mouth daily.  ? triamcinolone cream (KENALOG) 0.1 % Apply 1 application topically 2 (two) times daily as needed.  ? triamcinolone cream (KENALOG) 0.1 % Apply topically as needed.  ? umeclidinium bromide (INCRUSE ELLIPTA) 62.5 MCG/ACT AEPB Inhale 1 puff into the lungs as needed.  ? warfarin (COUMADIN) 5 MG tablet TAKE 1  AND 1/2 TABLETS BY MOUTH DAILY OR AS DIRECTED BY THE COUMADIN CLINIC  ? [DISCONTINUED] torsemide (DEMADEX) 20 MG tablet Take 1 tablet (20 mg total) by mouth daily. For 4 days then stop.  ?  ? ?Allergies:   Shellfish allergy  ? ?Social History  ? ?Socioeconomic History  ? Marital status: Married  ?  Spouse name: Anderson Malta  ? Number of children: 2  ? Years of education: 62  ? Highest education level: Bachelor's degree (e.g., BA, AB, BS)  ?Occupational History  ? Occupation: Full time   ?  Employer: [employer]  ?  Comment: In IT dept at [employer]  ?Tobacco Use  ? Smoking status: Never  ? Smokeless tobacco: Never  ?Vaping Use  ? Vaping Use: Never used  ?Substance and Sexual Activity  ? Alcohol use: No  ? Drug use: No  ? Sexual activity: Yes  ?  Partners: Female  ?Other Topics Concern  ? Not on file  ?Social History Narrative  ? Married  ? ?Social Determinants of Health  ? ?Financial Resource Strain: Not on file  ?Food Insecurity: Not on file  ?Transportation  Needs: Not on file  ?Physical Activity: Not on file  ?Stress: Not on file  ?Social Connections: Not on file  ?  ? ?Family History: ?The patient's family history includes Cancer in his maternal grandfather an

## 2021-09-06 ENCOUNTER — Ambulatory Visit: Payer: 59 | Admitting: Nurse Practitioner

## 2021-09-06 ENCOUNTER — Encounter: Payer: Self-pay | Admitting: *Deleted

## 2021-09-06 ENCOUNTER — Encounter: Payer: Self-pay | Admitting: Nurse Practitioner

## 2021-09-06 ENCOUNTER — Ambulatory Visit (INDEPENDENT_AMBULATORY_CARE_PROVIDER_SITE_OTHER): Payer: 59 | Admitting: *Deleted

## 2021-09-06 VITALS — BP 112/68 | HR 81 | Ht 70.0 in | Wt 197.6 lb

## 2021-09-06 DIAGNOSIS — I350 Nonrheumatic aortic (valve) stenosis: Secondary | ICD-10-CM

## 2021-09-06 DIAGNOSIS — Z7901 Long term (current) use of anticoagulants: Secondary | ICD-10-CM

## 2021-09-06 DIAGNOSIS — Z95810 Presence of automatic (implantable) cardiac defibrillator: Secondary | ICD-10-CM

## 2021-09-06 DIAGNOSIS — I255 Ischemic cardiomyopathy: Secondary | ICD-10-CM | POA: Diagnosis not present

## 2021-09-06 DIAGNOSIS — Z952 Presence of prosthetic heart valve: Secondary | ICD-10-CM | POA: Diagnosis not present

## 2021-09-06 DIAGNOSIS — I472 Ventricular tachycardia, unspecified: Secondary | ICD-10-CM

## 2021-09-06 LAB — POCT INR: INR: 2.3 (ref 2.0–3.0)

## 2021-09-06 MED ORDER — EMPAGLIFLOZIN 10 MG PO TABS: 10.0000 mg | ORAL_TABLET | Freq: Every day | ORAL | 11 refills | Status: DC

## 2021-09-06 NOTE — Progress Notes (Signed)
Cardiac Individual Treatment Plan ? ?Patient Details  ?Name: [name] ?MRN: [id number] ?Date of Birth: [birth year] ?Referring Provider:   ?Flowsheet Row Cardiac Rehab from 06/19/2021 in [facility] Cardiac and Pulmonary Rehab  ?Referring Provider Dr. Virl Axe  ? ?  ? ? ?Initial Encounter Date:  ?Flowsheet Row Cardiac Rehab from 06/19/2021 in [facility] Cardiac and Pulmonary Rehab  ?Date 06/19/21  ? ?  ? ? ?Visit Diagnosis: S/P AVR (aortic valve replacement) ? ?Patient's Home Medications on Admission: ? ?Current Outpatient Medications:  ?  acetaminophen (TYLENOL) 500 MG tablet, Take 500 mg by mouth every 6 (six) hours as needed for moderate pain., Disp: , Rfl:  ?  aspirin EC 81 MG EC tablet, Take 1 tablet (81 mg total) by mouth daily. Swallow whole., Disp: 30 tablet, Rfl: 11 ?  carvedilol (COREG) 6.25 MG tablet, Take 1 tablet (6.25 mg total) by mouth 2 (two) times daily., Disp: 180 tablet, Rfl: 3 ?  fluticasone (FLONASE) 50 MCG/ACT nasal spray, Place 2 sprays into both nostrils daily as needed for allergies., Disp: , Rfl:  ?  fluticasone (FLONASE) 50 MCG/ACT nasal spray, Place into the nose., Disp: , Rfl:  ?  loratadine (CLARITIN) 10 MG tablet, Take by mouth as needed., Disp: , Rfl:  ?  magnesium oxide (MAG-OX) 400 MG tablet, Take 1 tablet (400 mg total) by mouth 2 (two) times daily., Disp: 180 tablet, Rfl: 3 ?  meclizine (ANTIVERT) 25 MG tablet, Take 0.5-1 tablets (12.5-25 mg total) by mouth 3 (three) times daily as needed for dizziness., Disp: 30 tablet, Rfl: 2 ?  Multiple Vitamin (MULTIVITAMIN) tablet, Take 2 tablets by mouth daily., Disp: , Rfl:  ?  ondansetron (ZOFRAN-ODT) 4 MG disintegrating tablet, Take by mouth as needed., Disp: , Rfl:  ?  oxyCODONE (OXY IR/ROXICODONE) 5 MG immediate release tablet, Take 1 tablet (5 mg total) by mouth every 4 (four) hours as needed for severe pain., Disp: 30 tablet, Rfl: 0 ?  potassium chloride SA (KLOR-CON M) 20 MEQ tablet, Take 1 tablet (20 mEq total) by mouth daily. For 4 days  then stop., Disp: 4 tablet, Rfl: 0 ?  ranolazine (RANEXA) 1000 MG SR tablet, Take 1 tablet (1,000 mg total) by mouth 2 (two) times daily., Disp: 180 tablet, Rfl: 3 ?  sacubitril-valsartan (ENTRESTO) 24-26 MG, Take 1 tablet by mouth 2 (two) times daily., Disp: 180 tablet, Rfl: 3 ?  spironolactone (ALDACTONE) 25 MG tablet, Take 0.5 tablets (12.5 mg total) by mouth daily., Disp: 45 tablet, Rfl: 3 ?  torsemide (DEMADEX) 20 MG tablet, Take 1 tablet (20 mg total) by mouth daily. For 4 days then stop., Disp: 4 tablet, Rfl: 0 ?  triamcinolone cream (KENALOG) 0.1 %, Apply 1 application topically 2 (two) times daily as needed., Disp: 30 g, Rfl: 0 ?  triamcinolone cream (KENALOG) 0.1 %, Apply topically as needed., Disp: , Rfl:  ?  umeclidinium bromide (INCRUSE ELLIPTA) 62.5 MCG/ACT AEPB, Inhale 1 puff into the lungs as needed., Disp: , Rfl:  ?  warfarin (COUMADIN) 5 MG tablet, TAKE 1 AND 1/2 TABLETS BY MOUTH DAILY OR AS DIRECTED BY THE COUMADIN CLINIC, Disp: 50 tablet, Rfl: 3 ? ?Past Medical History: ?Past Medical History:  ?Diagnosis Date  ? AICD (automatic cardioverter/defibrillator) present   ? Anxiety   ? Aortic valve replaced   ? Requiring replacement, specifics not available  ? Complication of anesthesia   ? N & V  ? Dysrhythmia   ? Hearing loss of both ears 12/22/2015  ?  Heart murmur   ? Hypertension   ? IBD (inflammatory bowel disease) 09/23/2015  ? ICD (implantable cardiac defibrillator), BSX single   ? Myocardial infarction [facility])   ? Obesity 12/22/2015  ? PONV (postoperative nausea and vomiting)   ? Secondary cardiomyopathy ([place])   ? Syncope   ? Ventricular septal defect   ? Ventricular tachycardia   ? appropriate VT shock therapy /13  ? ? ?Tobacco Use: ?Social History  ? ?Tobacco Use  ?Smoking Status Never  ?Smokeless Tobacco Never  ? ? ?Labs: ?Review Flowsheet   ? ?  ?  Latest Ref Rng & Units 03/01/2021 04/25/2021 04/27/2021 04/28/2021  ?Labs for ITP Cardiac and Pulmonary Rehab  ?Cholestrol <200 mg/dL      ?LDL (calc)  mg/dL (calc)      ?HDL-C > OR = 40 mg/dL      ?Trlycerides <150 mg/dL      ?Hemoglobin A1c 4.8 - 5.6 %  4.5      ?PH, Arterial 7.350 - 7.450  7.391   7.386    ? 7.413    ? 7.408    ? 7.415    ? 7.388    ? 7.374    ? 7.425    ? 7.376   7.334    ? 7.334    ?PCO2 arterial 32.0 - 48.0 mmHg  43.0   43.8    ? 37.0    ? 39.2    ? 40.3    ? 43.1    ? 38.4    ? 40.1    ? 46.4   48.6    ? 47.0    ?Bicarbonate 20.0 - 28.0 mmol/L 27.3    ? 28.1   25.6   26.3    ? 24.3    ? 24.7    ? 25.9    ? 26.0    ? 22.4    ? 24.7    ? 26.3    ? 27.2   25.9    ? 25.0    ?TCO2 22 - 32 mmol/L 29    ? 30    28    ? 26    ? 26    ? 25    ? 27    ? 26    ? 27    ? 24    ? 24    ? 25    ? 26    ? 28    ? 25    ? 29    ? 29   27    ? 26    ?Acid-base deficit 0.0 - 2.0 mmol/L   1.0    ? 3.0   1.0    ?O2 Saturation % 72.0    ? 77.0   98.5   100.0    ? 100.0    ? 100.0    ? 100.0    ? 100.0    ? 100.0    ? 88.0    ? 100.0    ? 100.0   98.0    ? 96.0    ? ?  07/11/2021  ?Labs for ITP Cardiac and Pulmonary Rehab  ?Cholestrol 174    ?LDL (calc) 102    ?HDL-C 45    ?Trlycerides 174    ?Hemoglobin A1c   ?PH, Arterial   ?PCO2 arterial   ?Bicarbonate   ?TCO2   ?Acid-base deficit   ?O2 Saturation   ?  ? ? Multiple values from one  day are sorted in reverse-chronological order  ?  ?  ? ? ? ?Exercise Target Goals: ?Exercise Program Goal: ?Individual exercise prescription set using results from initial 6 min walk test and THRR while considering  patient?s activity barriers and safety.  ? ?Exercise Prescription Goal: ?Initial exercise prescription builds to 30-45 minutes a day of aerobic activity, 2-3 days per week.  Home exercise guidelines will be given to patient during program as part of exercise prescription that the participant will acknowledge. ? ? ?Education: Aerobic Exercise: ?- Group verbal and visual presentation on the components of exercise prescription. Introduces F.I.T.T principle from ACSM for exercise prescriptions.  Reviews F.I.T.T. principles of  aerobic exercise including progression. Written material given at graduation. ? ? ?Education: Resistance Exercise: ?- Group verbal and visual presentation on the components of exercise prescription. Introduces F.I.T.T principle from ACSM for exercise prescriptions  Reviews F.I.T.T. principles of resistance exercise including progression. Written material given at graduation. ? ?  ?Education: Exercise & Equipment Safety: ?- Individual verbal instruction and demonstration of equipment use and safety with use of the equipment. ?Flowsheet Row Cardiac Rehab from 06/21/2021 in [facility] Cardiac and Pulmonary Rehab  ?Date 06/19/21  ?Educator [place]  ?Instruction Review Code 1- Verbalizes Understanding  ? ?  ? ? ?Education: Exercise Physiology & General Exercise Guidelines: ?- Group verbal and written instruction with models to review the exercise physiology of the cardiovascular system and associated critical values. Provides general exercise guidelines with specific guidelines to those with heart or lung disease.  ? ? ?Education: Flexibility, Balance, Mind/Body Relaxation: ?- Group verbal and visual presentation with interactive activity on the components of exercise prescription. Introduces F.I.T.T principle from ACSM for exercise prescriptions. Reviews F.I.T.T. principles of flexibility and balance exercise training including progression. Also discusses the mind body connection.  Reviews various relaxation techniques to help reduce and manage stress (i.e. Deep breathing, progressive muscle relaxation, and visualization). Balance handout provided to take home. Written material given at graduation. ? ? ?Activity Barriers & Risk Stratification: ? ? ?6 Minute Walk: ? 6 Minute Walk   ? ? Becker Name 06/19/21 1057  ?  ?  ?  ? 6 Minute Walk  ? Phase Initial    ? Distance 1300 feet    ? Walk Time 6 minutes    ? # of Rest Breaks 0    ? MPH 2.46    ? METS 4.2    ? RPE 7    ? VO2 Peak 14.7    ? Symptoms No    ? Resting HR 80 bpm    ? Resting BP  100/70    ? Resting Oxygen Saturation  96 %    ? Exercise Oxygen Saturation  during 6 min walk 99 %    ? Max Ex. HR 100 bpm    ? Max Ex. BP 124/78    ? 2 Minute Post BP 104/64    ? ?  ?  ? ?  ? ? ?Oxyg

## 2021-09-06 NOTE — Patient Instructions (Addendum)
Medication Instructions:  ? ?START Jardiance one (1) tablet by mouth ( 10 mg ) daily.  ? ?*If you need a refill on your cardiac medications before your next appointment, please call your pharmacy* ? ?Lab: Go to [employer] [facility] [facility] on Friday, April 28 and bring paperwork.  ? ?Follow-Up: ?At [facility], you and your health needs are our priority.  As part of our continuing mission to provide you with exceptional heart care, we have created designated Provider Care Teams.  These Care Teams include your primary Cardiologist (physician) and Advanced Practice Providers (APPs -  Physician Assistants and Nurse Practitioners) who all work together to provide you with the care you need, when you need it. ? ?We recommend signing up for the patient portal called "MyChart".  Sign up information is provided on this After Visit Summary.  MyChart is used to connect with patients for Virtual Visits (Telemedicine).  Patients are able to view lab/test results, encounter notes, upcoming appointments, etc.  Non-urgent messages can be sent to your provider as well.   ?To learn more about what you can do with MyChart, go to [email or web address].   ? ?Your next appointment:   ?3 month(s) ? ?The format for your next appointment:   ?In Person ? ?Provider:   ?Christen Bame, NP       ? ? ? ?Important Information About Sugar ? ? ? ? ?  ?

## 2021-09-06 NOTE — Patient Instructions (Signed)
Description   ?Today take 1.5 tablets then start taking warfarin 1.5 tablets daily. Recheck INR in 3 weeks. Per Dr Vivi Martens note 07/05/21 pt has ON-X valve placed 04/27/21, after 3 months can decrease INR goal 1.5-2.0 with '81mg'$  ASA.  Be consistent with green (2 times per week)  ?Coumadin Clinic [phone]  ?  ?  ?

## 2021-09-12 DIAGNOSIS — Z952 Presence of prosthetic heart valve: Secondary | ICD-10-CM

## 2021-09-13 DIAGNOSIS — Z952 Presence of prosthetic heart valve: Secondary | ICD-10-CM | POA: Diagnosis not present

## 2021-09-13 NOTE — Progress Notes (Signed)
Daily Session Note ? ?Patient Details  ?Name: [name] ?MRN: [id number] ?Date of Birth: 04/05/1976 ?Referring Provider:   ?Flowsheet Row Cardiac Rehab from 06/19/2021 in [facility] Cardiac and Pulmonary Rehab  ?Referring Provider Dr. Virl Axe  ? ?  ? ? ?Encounter Date: 09/13/2021 ? ?Check In: ? Session Check In - 09/13/21 1710   ? ?  ? Check-In  ? Supervising physician immediately available to respond to emergencies See telemetry face sheet for immediately available ER MD   ? Location [facility]-Cardiac & Pulmonary Rehab   ? Staff Present Birdie Sons, MPA, RN;Joseph [place], RCP,RRT,BSRT;Melissa [place], RDN, LDN   ? Virtual Visit No   ? Medication changes reported     No   ? Fall or balance concerns reported    No   ? Warm-up and Cool-down Performed on first and last piece of equipment   ? Resistance Training Performed Yes   ? VAD Patient? No   ? PAD/SET Patient? No   ?  ? Pain Assessment  ? Currently in Pain? No/denies   ? ?  ?  ? ?  ? ? ? ? ? ?Social History  ? ?Tobacco Use  ?Smoking Status Never  ?Smokeless Tobacco Never  ? ? ?Goals Met:  ?Independence with exercise equipment ?Exercise tolerated well ?No report of concerns or symptoms today ?Strength training completed today ? ?Goals Unmet:  ?Not Applicable ? ?Comments: Pt able to follow exercise prescription today without complaint.  Will continue to monitor for progression. ? ? ? ? ? ?Dr. Emily Filbert is Medical Director for [place].  ?Dr. Ottie Glazier is Medical Director for [facility] Pulmonary Rehabilitation. ?

## 2021-09-18 ENCOUNTER — Encounter: Payer: 59 | Attending: Internal Medicine

## 2021-09-18 DIAGNOSIS — Z952 Presence of prosthetic heart valve: Secondary | ICD-10-CM | POA: Insufficient documentation

## 2021-09-18 NOTE — Progress Notes (Signed)
Daily Session Note ? ?Patient Details  ?Name: [name] ?MRN: [id number] ?Date of Birth: 10-01-1975 ?Referring Provider:   ?Flowsheet Row Cardiac Rehab from 06/19/2021 in [facility] Cardiac and Pulmonary Rehab  ?Referring Provider Dr. Virl Axe  ? ?  ? ? ?Encounter Date: 09/18/2021 ? ?Check In: ? Session Check In - 09/18/21 1715   ? ?  ? Check-In  ? Supervising physician immediately available to respond to emergencies See telemetry face sheet for immediately available ER MD   ? Location [facility]-Cardiac & Pulmonary Rehab   ? Staff Present Birdie Sons, MPA, RN;Melissa [place], RDN, LDN;Joseph [place], RCP,RRT,BSRT   ? Virtual Visit No   ? Medication changes reported     No   ? Fall or balance concerns reported    No   ? Warm-up and Cool-down Performed on first and last piece of equipment   ? Resistance Training Performed Yes   ? VAD Patient? No   ? PAD/SET Patient? No   ?  ? Pain Assessment  ? Currently in Pain? No/denies   ? ?  ?  ? ?  ? ? ? ? ? ?Social History  ? ?Tobacco Use  ?Smoking Status Never  ?Smokeless Tobacco Never  ? ? ?Goals Met:  ?Independence with exercise equipment ?Exercise tolerated well ?No report of concerns or symptoms today ?Strength training completed today ? ?Goals Unmet:  ?Not Applicable ? ?Comments: Pt able to follow exercise prescription today without complaint.  Will continue to monitor for progression. ? ? ? ?Dr. Emily Filbert is Medical Director for [place].  ?Dr. Ottie Glazier is Medical Director for [facility] Pulmonary Rehabilitation. ?

## 2021-09-20 DIAGNOSIS — Z952 Presence of prosthetic heart valve: Secondary | ICD-10-CM

## 2021-09-20 NOTE — Progress Notes (Signed)
Daily Session Note ? ?Patient Details  ?Name: [name] ?MRN: [id number] ?Date of Birth: 1975-08-14 ?Referring Provider:   ?Flowsheet Row Cardiac Rehab from 06/19/2021 in [facility] Cardiac and Pulmonary Rehab  ?Referring Provider Dr. Virl Axe  ? ?  ? ? ?Encounter Date: 09/20/2021 ? ?Check In: ? Session Check In - 09/20/21 1716   ? ?  ? Check-In  ? Supervising physician immediately available to respond to emergencies See telemetry face sheet for immediately available ER MD   ? Location [facility]-Cardiac & Pulmonary Rehab   ? Staff Present Birdie Sons, MPA, RN;Melissa [place], RDN, LDN;Joseph [place], RCP,RRT,BSRT   ? Virtual Visit No   ? Medication changes reported     No   ? Fall or balance concerns reported    No   ? Warm-up and Cool-down Performed on first and last piece of equipment   ? Resistance Training Performed Yes   ? VAD Patient? No   ? PAD/SET Patient? No   ?  ? Pain Assessment  ? Currently in Pain? No/denies   ? ?  ?  ? ?  ? ? ? ? ? ?Social History  ? ?Tobacco Use  ?Smoking Status Never  ?Smokeless Tobacco Never  ? ? ?Goals Met:  ?Independence with exercise equipment ?Exercise tolerated well ?No report of concerns or symptoms today ?Strength training completed today ? ?Goals Unmet:  ?Not Applicable ? ?Comments: Pt able to follow exercise prescription today without complaint.  Will continue to monitor for progression. ? ? ? ?Dr. Emily Filbert is Medical Director for [place].  ?Dr. Ottie Glazier is Medical Director for [facility] Pulmonary Rehabilitation. ?

## 2021-09-21 ENCOUNTER — Encounter: Payer: 59 | Admitting: *Deleted

## 2021-09-21 DIAGNOSIS — Z952 Presence of prosthetic heart valve: Secondary | ICD-10-CM | POA: Diagnosis not present

## 2021-09-21 NOTE — Progress Notes (Signed)
Daily Session Note ? ?Patient Details  ?Name: [name] ?MRN: [id number] ?Date of Birth: [birth year] ?Referring Provider:   ?Flowsheet Row Cardiac Rehab from 06/19/2021 in [facility] Cardiac and Pulmonary Rehab  ?Referring Provider Dr. Virl Axe  ? ?  ? ? ?Encounter Date: 09/21/2021 ? ?Check In: ? Session Check In - 09/21/21 1739   ? ?  ? Check-In  ? Supervising physician immediately available to respond to emergencies See telemetry face sheet for immediately available ER MD   ? Location [facility]-Cardiac & Pulmonary Rehab   ? Staff Present Heath Lark, RN, BSN, CCRP;Melissa [place], RDN, LDN;Joseph [place], [place]   ? Virtual Visit No   ? Medication changes reported     No   ? Fall or balance concerns reported    No   ? Warm-up and Cool-down Performed on first and last piece of equipment   ? Resistance Training Performed Yes   ? VAD Patient? No   ? PAD/SET Patient? No   ?  ? Pain Assessment  ? Currently in Pain? No/denies   ? ?  ?  ? ?  ? ? ? ? ? ?Social History  ? ?Tobacco Use  ?Smoking Status Never  ?Smokeless Tobacco Never  ? ? ?Goals Met:  ?Independence with exercise equipment ?Exercise tolerated well ?No report of concerns or symptoms today ? ?Goals Unmet:  ?Not Applicable ? ?Comments: Pt able to follow exercise prescription today without complaint.  Will continue to monitor for progression. ? ? ? ?Dr. Emily Filbert is Medical Director for [place].  ?Dr. Ottie Glazier is Medical Director for [facility] Pulmonary Rehabilitation. ?

## 2021-09-22 ENCOUNTER — Other Ambulatory Visit: Payer: Self-pay | Admitting: *Deleted

## 2021-09-22 DIAGNOSIS — Z7901 Long term (current) use of anticoagulants: Secondary | ICD-10-CM

## 2021-09-22 DIAGNOSIS — I5022 Chronic systolic (congestive) heart failure: Secondary | ICD-10-CM

## 2021-09-22 DIAGNOSIS — Z952 Presence of prosthetic heart valve: Secondary | ICD-10-CM

## 2021-09-22 DIAGNOSIS — I350 Nonrheumatic aortic (valve) stenosis: Secondary | ICD-10-CM

## 2021-09-23 LAB — BASIC METABOLIC PANEL
BUN/Creatinine Ratio: 13 (ref 9–20)
BUN: 16 mg/dL (ref 6–24)
CO2: 25 mmol/L (ref 20–29)
Calcium: 9.5 mg/dL (ref 8.7–10.2)
Chloride: 101 mmol/L (ref 96–106)
Creatinine, Ser: 1.19 mg/dL (ref 0.76–1.27)
Glucose: 76 mg/dL (ref 70–99)
Potassium: 4.3 mmol/L (ref 3.5–5.2)
Sodium: 139 mmol/L (ref 134–144)
eGFR: 76 mL/min/{1.73_m2} (ref >59)

## 2021-09-25 DIAGNOSIS — Z952 Presence of prosthetic heart valve: Secondary | ICD-10-CM

## 2021-09-25 NOTE — Progress Notes (Signed)
Daily Session Note ? ?Patient Details  ?Name: [name] ?MRN: [id number] ?Date of Birth: 1975/06/29 ?Referring Provider:   ?Flowsheet Row Cardiac Rehab from 06/19/2021 in [facility] Cardiac and Pulmonary Rehab  ?Referring Provider Dr. Virl Axe  ? ?  ? ? ?Encounter Date: 09/25/2021 ? ?Check In: ? Session Check In - 09/25/21 1701   ? ?  ? Check-In  ? Supervising physician immediately available to respond to emergencies See telemetry face sheet for immediately available ER MD   ? Location [facility]-Cardiac & Pulmonary Rehab   ? Staff Present Birdie Sons, MPA, RN;Melissa [place], RDN, LDN;Susanne Bice, RN, BSN, CCRP   ? Virtual Visit No   ? Medication changes reported     No   ? Fall or balance concerns reported    No   ? Warm-up and Cool-down Performed on first and last piece of equipment   ? Resistance Training Performed Yes   ? VAD Patient? No   ? PAD/SET Patient? No   ?  ? Pain Assessment  ? Currently in Pain? No/denies   ? ?  ?  ? ?  ? ? ? ? ? ?Social History  ? ?Tobacco Use  ?Smoking Status Never  ?Smokeless Tobacco Never  ? ? ?Goals Met:  ?Independence with exercise equipment ?Exercise tolerated well ?No report of concerns or symptoms today ?Strength training completed today ? ?Goals Unmet:  ?Not Applicable ? ?Comments: Pt able to follow exercise prescription today without complaint.  Will continue to monitor for progression. ? ? ? ?Dr. Emily Filbert is Medical Director for [place].  ?Dr. Ottie Glazier is Medical Director for [facility] Pulmonary Rehabilitation. ?

## 2021-09-27 ENCOUNTER — Ambulatory Visit (INDEPENDENT_AMBULATORY_CARE_PROVIDER_SITE_OTHER): Payer: 59

## 2021-09-27 VITALS — Ht 70.75 in | Wt 198.4 lb

## 2021-09-27 DIAGNOSIS — I350 Nonrheumatic aortic (valve) stenosis: Secondary | ICD-10-CM

## 2021-09-27 DIAGNOSIS — Z952 Presence of prosthetic heart valve: Secondary | ICD-10-CM

## 2021-09-27 DIAGNOSIS — Z5181 Encounter for therapeutic drug level monitoring: Secondary | ICD-10-CM | POA: Diagnosis not present

## 2021-09-27 DIAGNOSIS — Z7901 Long term (current) use of anticoagulants: Secondary | ICD-10-CM | POA: Diagnosis not present

## 2021-09-27 LAB — POCT INR: INR: 1.9 — AB (ref 2.0–3.0)

## 2021-09-27 NOTE — Patient Instructions (Signed)
Continue taking warfarin 1.5 tablets daily. Recheck INR in 6 weeks. Per Dr Bartle's note 07/05/21 pt has ON-X valve placed 04/27/21, after 3 months can decrease INR goal 1.5-2.0 with 81mg ASA.  Be consistent with green (2 times per week)  [facility] [phone] 

## 2021-09-27 NOTE — Progress Notes (Signed)
Daily Session Note ? ?Patient Details  ?Name: [name] ?MRN: [id number] ?Date of Birth: 1975-06-01 ?Referring Provider:   ?Flowsheet Row Cardiac Rehab from 06/19/2021 in [facility] Cardiac and Pulmonary Rehab  ?Referring Provider Dr. Virl Axe  ? ?  ? ? ?Encounter Date: 09/27/2021 ? ?Check In: ? Session Check In - 09/27/21 1655   ? ?  ? Check-In  ? Supervising physician immediately available to respond to emergencies See telemetry face sheet for immediately available ER MD   ? Location [facility]-Cardiac & Pulmonary Rehab   ? Staff Present Birdie Sons, MPA, RN;Melissa [place], RDN, LDN;Joseph [place], RCP,RRT,BSRT   ? Virtual Visit No   ? Medication changes reported     No   ? Fall or balance concerns reported    No   ? Warm-up and Cool-down Performed on first and last piece of equipment   ? Resistance Training Performed Yes   ? VAD Patient? No   ? PAD/SET Patient? No   ?  ? Pain Assessment  ? Currently in Pain? No/denies   ? ?  ?  ? ?  ? ? ? ? ? ?Social History  ? ?Tobacco Use  ?Smoking Status Never  ?Smokeless Tobacco Never  ? ? ?Goals Met:  ?Independence with exercise equipment ?Exercise tolerated well ?No report of concerns or symptoms today ?Strength training completed today ? ?Goals Unmet:  ?Not Applicable ? ?Comments: Pt able to follow exercise prescription today without complaint.  Will continue to monitor for progression. ? ? ? ?Dr. Emily Filbert is Medical Director for [place].  ?Dr. Ottie Glazier is Medical Director for [facility] Pulmonary Rehabilitation. ?

## 2021-09-28 DIAGNOSIS — Z952 Presence of prosthetic heart valve: Secondary | ICD-10-CM | POA: Diagnosis not present

## 2021-09-28 NOTE — Patient Instructions (Signed)
Discharge Patient Instructions ? ?Patient Details  ?Name: [name] ?MRN: [id number] ?Date of Birth: [birth year] ?Referring Provider:  Deboraha Sprang, MD ? ? ?Number of Visits: 72 ? ?Reason for Discharge:  ?Patient reached a stable level of exercise. ?Patient independent in their exercise. ?Patient has met program and personal goals. ? ?Smoking History:  ?Social History  ? ?Tobacco Use  ?Smoking Status Never  ?Smokeless Tobacco Never  ? ? ?Diagnosis:  ?S/P AVR (aortic valve replacement) ? ?Initial Exercise Prescription: ? Initial Exercise Prescription - 06/19/21 1000   ? ?  ? Date of Initial Exercise RX and Referring Provider  ? Date 06/19/21   ? Referring Provider Dr. Virl Axe   ?  ? Oxygen  ? Maintain Oxygen Saturation 88% or higher   ?  ? Treadmill  ? MPH 3.5   ? Grade 1   ? Minutes 15   ? METs 4.16   ?  ? [place]umb[place] Bike  ? Level 3   ? RPM 60   ? Minutes 15   ? METs 4.2   ?  ? [place]umbant Elliptical  ? Level 2   ? RPM 50   ? Minutes 15   ? METs 4.2   ?  ? REL-XR  ? Level 3   ? Speed 50   ? Minutes 15   ? METs 4.2   ?  ? Prescription Details  ? Frequency (times per week) 3   ? Duration Progress to 30 minutes of continuous aerobic without signs/symptoms of physical distress   ?  ? Intensity  ? THRR 40-80% of Max Heartrate 117-154   ? Ratings of Perceived Exertion 11-13   ? Perceived Dyspnea 0-4   ?  ? Progression  ? Progression Continue to progress workloads to maintain intensity without signs/symptoms of physical distress.   ?  ? Resistance Training  ? Training Prescription Yes   ? Weight 3   ? Reps 10-15   ? ?  ?  ? ?  ? ? ?Discharge Exercise Prescription (Final Exercise Prescription Changes): ? Exercise Prescription Changes - 09/27/21 1700   ? ?  ? Response to Exercise  ? Blood Pressure (Admit) 118/68   ? Blood Pressure (Exit) 120/70   ? Heart Rate (Admit) 81 bpm   ? Heart Rate (Exercise) 121 bpm   ? Heart Rate (Exit) 86 bpm   ? Rating of Perceived Exertion (Exercise) 13   ? Symptoms none   ? Duration  Continue with 30 min of aerobic exercise without signs/symptoms of physical distress.   ? Intensity THRR unchanged   ?  ? Progression  ? Progression Continue to progress workloads to maintain intensity without signs/symptoms of physical distress.   ? Average METs 4.95   ?  ? Resistance Training  ? Training Prescription Yes   ? Weight 12 lb   ? Reps 10-15   ?  ? Interval Training  ? Interval Training No   ?  ? Treadmill  ? MPH 3.4   ? Grade 3   ? Minutes 15   ? METs 5.01   ?  ? Elliptical  ? Level 4   ? Minutes 15   ? METs 4.6   ?  ? REL-XR  ? Level 8   ? Minutes 15   ? METs 6.5   ?  ? Home Exercise Plan  ? Plans to continue exercise at Home (comment)   walking, elliptial, staff videos  ? Frequency Add 2  additional days to program exercise sessions.   ? Initial Home Exercises Provided 06/30/21   ?  ? Oxygen  ? Maintain Oxygen Saturation 88% or higher   ? ?  ?  ? ?  ? ? ?Functional Capacity: ? 6 Minute Walk   ? ? [place] Name 06/19/21 1057 09/27/21 1732  ?  ?  ? 6 Minute Walk  ? Phase Initial Discharge   ? Distance 1300 feet 1580 feet   ? Distance % Change -- 21.5 %   ? Distance Feet Change -- 280 ft   ? Walk Time 6 minutes 6 minutes   ? # of Rest Breaks 0 0   ? MPH 2.46 2.99   ? METS 4.2 4.46   ? RPE 7 8   ? VO2 Peak 14.7 15.62   ? Symptoms No No   ? Resting HR 80 bpm 79 bpm   ? Resting BP 100/70 118/66   ? Resting Oxygen Saturation  96 % 98 %   ? Exercise Oxygen Saturation  during 6 min walk 99 % --   ? Max Ex. HR 100 bpm 85 bpm   ? Max Ex. BP 124/78 120/78   ? 2 Minute Post BP 104/64 --   ? ?  ?  ? ?  ? ? ?Nutrition & Weight - Outcomes: ? Pre Biometrics - 06/19/21 1103   ? ?  ? Pre Biometrics  ? Height 5' 10.75" (1.797 m)   ? Weight 191 lb 8 oz (86.9 kg)   ? BMI (Calculated) 26.9   ? Single Leg Stand 30 seconds   ? ?  ?  ? ?  ? ? Post Biometrics - 09/27/21 1736   ? ?  ?  Post  Biometrics  ? Height 5' 10.75" (1.797 m)   ? Weight 198 lb 6.4 oz (90 kg)   ? BMI (Calculated) 27.87   ? ?  ?  ? ?  ? ? ?Nutrition: ? Nutrition  Therapy & Goals - 07/05/21 1349   ? ?  ? Nutrition Therapy  ? Diet Heart healthy, low Na   ? Drug/Food Interactions Coumadin/Vit K   ? Protein (specify units) 70g   ? Fiber 30 grams   ? Whole Grain Foods 3 servings   ? Saturated Fats 12 max. grams   ? Fruits and Vegetables 8 servings/day   ? Sodium 2 grams   ?  ? Personal Nutrition Goals  ? Nutrition Goal ST: practice meal planning and ingredient preppring - start with a couple of versatile ingredients LT: 8 servings fruits/vegetables per day, Follow MyPlate structure   ? Comments 46 y.o. M admitted to rehab for s/p AVR also presenting with HTN, CAD, ICD (placement 2017). PMHx IBD (unspecified in problem list). PSHx cholecystectomy, CABG. Relevant medications include warfarin, coreg, mag-ox, MVI, zofran, potassium, torsemide. Per MD note, 06/20/21, pt would like to get back to a vegan diet. PYP Score: 64. Vegetables & Fruits 7/12. Breads, Grains & Cereals 8/12. Red & Processed Meat 12/12. Poultry 0/2. Fish & Shellfish 1/4. Beans, Nuts & Seeds 0/4. Milk & Dairy Foods 5/6. Toppings, Oils, Seasonings & Salt 15/20. Sweets, Snacks & Restaurant Food 8/14. Beverages 8/10. He does not eat red meat, mainly poultry and fish. He was eating more vegan and now is not due to the warfarin. Encouraged that with warfarin and vitamin K consistency is key. He was and continues to take a mutlivitamin. He feels like his biggest issue is snacks - usually  oranges and apples, but sometimes has a sweet tooth. B: Veggie patty and eggwhites or oatmeal or cheerios. L: fruit, small portion of chicken and vegetables S: candy bar D: Pizza, buffalo wings, meat with vegetables and potato. Discussed honoring hunger and meal planning tips. Anne is open to practicing meal planning skills. Discussed general heart healthy eating.   ?  ? Intervention Plan  ? Intervention Prescribe, educate and counsel regarding individualized specific dietary modifications aiming towards targeted core components such as  weight, hypertension, lipid management, diabetes, heart failure and other comorbidities.   ? Expected Outcomes Short Term Goal: Understand basic principles of dietary content, such as calories, fat, sodium, cholesterol and nutrients.;Short Term Goal: A plan has been developed with personal nutrition goals set during dietitian appointment.;Long Term Goal: Adherence to prescribed nutrition plan.   ? ?  ?  ? ?  ? ? ? ?Goals reviewed with patient; copy given to patient. ?

## 2021-09-28 NOTE — Progress Notes (Signed)
Daily Session Note ? ?Patient Details  ?Name: [name] ?MRN: [id number] ?Date of Birth: 03/14/1976 ?Referring Provider:   ?Flowsheet Row Cardiac Rehab from 06/19/2021 in [facility] Cardiac and Pulmonary Rehab  ?Referring Provider Dr. Virl Axe  ? ?  ? ? ?Encounter Date: 09/28/2021 ? ?Check In: ? Session Check In - 09/28/21 1736   ? ?  ? Check-In  ? Supervising physician immediately available to respond to emergencies See telemetry face sheet for immediately available ER MD   ? Staff Present Coralie Keens, MS, ASCM CEP, Exercise Physiologist;Ailany Koren, RN, BSN;Joseph [place], RCP,RRT,BSRT   ? Virtual Visit No   ? Medication changes reported     No   ? Fall or balance concerns reported    No   ? Warm-up and Cool-down Performed on first and last piece of equipment   ? Resistance Training Performed Yes   ? VAD Patient? No   ? PAD/SET Patient? No   ?  ? Pain Assessment  ? Currently in Pain? No/denies   ? ?  ?  ? ?  ? ? ? ? ? ?Social History  ? ?Tobacco Use  ?Smoking Status Never  ?Smokeless Tobacco Never  ? ? ?Goals Met:  ?Independence with exercise equipment ?Exercise tolerated well ?No report of concerns or symptoms today ?Strength training completed today ? ?Goals Unmet:  ?Not Applicable ? ?Comments: Pt able to follow exercise prescription today without complaint.  Will continue to monitor for progression. ? ? ?Dr. Emily Filbert is Medical Director for [place].  ?Dr. Ottie Glazier is Medical Director for [facility] Pulmonary Rehabilitation. ?

## 2021-10-03 LAB — BASIC METABOLIC PANEL
BUN/Creatinine Ratio: 14 (ref 9–20)
BUN: 18 mg/dL (ref 6–24)
CO2: 26 mmol/L (ref 20–29)
Calcium: 9.5 mg/dL (ref 8.7–10.2)
Creatinine, Ser: 1.33 mg/dL — ABNORMAL HIGH (ref 0.76–1.27)
Glucose: 86 mg/dL (ref 70–99)
eGFR: 66 mL/min/{1.73_m2} (ref >59)

## 2021-10-04 ENCOUNTER — Encounter: Payer: Self-pay | Admitting: *Deleted

## 2021-10-04 DIAGNOSIS — Z952 Presence of prosthetic heart valve: Secondary | ICD-10-CM

## 2021-10-04 NOTE — Progress Notes (Signed)
Daily Session Note ? ?Patient Details  ?Name: [name] ?MRN: [id number] ?Date of Birth: Jan 16, 1976 ?Referring Provider:   ?Flowsheet Row Cardiac Rehab from 06/19/2021 in [facility] Cardiac and Pulmonary Rehab  ?Referring Provider Dr. Virl Axe  ? ?  ? ? ?Encounter Date: 10/04/2021 ? ?Check In: ? Session Check In - 10/04/21 1729   ? ?  ? Check-In  ? Supervising physician immediately available to respond to emergencies See telemetry face sheet for immediately available ER MD   ? Location [facility]-Cardiac & Pulmonary Rehab   ? Staff Present Hope Budds, RDN, Luther Redo, MPA, RN;Joseph [place], RCP,RRT,BSRT   ? Virtual Visit No   ? Medication changes reported     No   ? Fall or balance concerns reported    No   ? Warm-up and Cool-down Performed on first and last piece of equipment   ? Resistance Training Performed Yes   ? VAD Patient? No   ? PAD/SET Patient? No   ?  ? Pain Assessment  ? Currently in Pain? No/denies   ? ?  ?  ? ?  ? ? ? ? ? ?Social History  ? ?Tobacco Use  ?Smoking Status Never  ?Smokeless Tobacco Never  ? ? ?Goals Met:  ?Independence with exercise equipment ?Exercise tolerated well ?No report of concerns or symptoms today ?Strength training completed today ? ?Goals Unmet:  ?Not Applicable ? ?Comments: Pt able to follow exercise prescription today without complaint.  Will continue to monitor for progression. ? ? ? ?Dr. Emily Filbert is Medical Director for [place].  ?Dr. Ottie Glazier is Medical Director for [facility] Pulmonary Rehabilitation. ?

## 2021-10-04 NOTE — Progress Notes (Signed)
Cardiac Individual Treatment Plan ? ?Patient Details  ?Name: [name] ?MRN: [id number] ?Date of Birth: 1975/06/03 ?Referring Provider:   ?Flowsheet Row Cardiac Rehab from 06/19/2021 in [facility] Cardiac and Pulmonary Rehab  ?Referring Provider Dr. Virl Axe  ? ?  ? ? ?Initial Encounter Date:  ?Flowsheet Row Cardiac Rehab from 06/19/2021 in [facility] Cardiac and Pulmonary Rehab  ?Date 06/19/21  ? ?  ? ? ?Visit Diagnosis: S/P AVR (aortic valve replacement) ? ?Patient's Home Medications on Admission: ? ?Current Outpatient Medications:  ?  acetaminophen (TYLENOL) 500 MG tablet, Take 500 mg by mouth every 6 (six) hours as needed for moderate pain., Disp: , Rfl:  ?  aspirin EC 81 MG EC tablet, Take 1 tablet (81 mg total) by mouth daily. Swallow whole., Disp: 30 tablet, Rfl: 11 ?  carvedilol (COREG) 6.25 MG tablet, Take 1 tablet (6.25 mg total) by mouth 2 (two) times daily., Disp: 180 tablet, Rfl: 3 ?  empagliflozin (JARDIANCE) 10 MG TABS tablet, Take 1 tablet (10 mg total) by mouth daily before breakfast., Disp: 30 tablet, Rfl: 11 ?  fluticasone (FLONASE) 50 MCG/ACT nasal spray, Place 2 sprays into both nostrils daily as needed for allergies., Disp: , Rfl:  ?  fluticasone (FLONASE) 50 MCG/ACT nasal spray, Place into the nose., Disp: , Rfl:  ?  loratadine (CLARITIN) 10 MG tablet, Take by mouth as needed., Disp: , Rfl:  ?  magnesium oxide (MAG-OX) 400 MG tablet, Take 1 tablet (400 mg total) by mouth 2 (two) times daily., Disp: 180 tablet, Rfl: 3 ?  meclizine (ANTIVERT) 25 MG tablet, Take 0.5-1 tablets (12.5-25 mg total) by mouth 3 (three) times daily as needed for dizziness., Disp: 30 tablet, Rfl: 2 ?  Multiple Vitamin (MULTIVITAMIN) tablet, Take 2 tablets by mouth daily., Disp: , Rfl:  ?  ondansetron (ZOFRAN-ODT) 4 MG disintegrating tablet, Take by mouth as needed., Disp: , Rfl:  ?  oxyCODONE (OXY IR/ROXICODONE) 5 MG immediate release tablet, Take 1 tablet (5 mg total) by mouth every 4 (four) hours as needed for severe pain.,  Disp: 30 tablet, Rfl: 0 ?  potassium chloride SA (KLOR-CON M) 20 MEQ tablet, Take 1 tablet (20 mEq total) by mouth daily. For 4 days then stop., Disp: 4 tablet, Rfl: 0 ?  ranolazine (RANEXA) 1000 MG SR tablet, Take 1 tablet (1,000 mg total) by mouth 2 (two) times daily., Disp: 180 tablet, Rfl: 3 ?  sacubitril-valsartan (ENTRESTO) 24-26 MG, Take 1 tablet by mouth 2 (two) times daily., Disp: 180 tablet, Rfl: 3 ?  spironolactone (ALDACTONE) 25 MG tablet, Take 0.5 tablets (12.5 mg total) by mouth daily., Disp: 45 tablet, Rfl: 3 ?  triamcinolone cream (KENALOG) 0.1 %, Apply 1 application topically 2 (two) times daily as needed., Disp: 30 g, Rfl: 0 ?  triamcinolone cream (KENALOG) 0.1 %, Apply topically as needed., Disp: , Rfl:  ?  umeclidinium bromide (INCRUSE ELLIPTA) 62.5 MCG/ACT AEPB, Inhale 1 puff into the lungs as needed., Disp: , Rfl:  ?  warfarin (COUMADIN) 5 MG tablet, TAKE 1 AND 1/2 TABLETS BY MOUTH DAILY OR AS DIRECTED BY THE COUMADIN CLINIC, Disp: 50 tablet, Rfl: 3 ? ?Past Medical History: ?Past Medical History:  ?Diagnosis Date  ? AICD (automatic cardioverter/defibrillator) present   ? Anxiety   ? Aortic valve replaced   ? Requiring replacement, specifics not available  ? Complication of anesthesia   ? N & V  ? Dysrhythmia   ? Hearing loss of both ears 12/22/2015  ? Heart  murmur   ? Hypertension   ? IBD (inflammatory bowel disease) 09/23/2015  ? ICD (implantable cardiac defibrillator), BSX single   ? Myocardial infarction [facility])   ? Obesity 12/22/2015  ? PONV (postoperative nausea and vomiting)   ? Secondary cardiomyopathy ([place])   ? Syncope   ? Ventricular septal defect   ? Ventricular tachycardia ([place])   ? appropriate VT shock therapy /13  ? ? ?Tobacco Use: ?Social History  ? ?Tobacco Use  ?Smoking Status Never  ?Smokeless Tobacco Never  ? ? ?Labs: ?Review Flowsheet   ? ?  ?  Latest Ref Rng & Units 03/01/2021 04/25/2021 04/27/2021 04/28/2021  ?Labs for ITP Cardiac and Pulmonary Rehab  ?Cholestrol <200 mg/dL       ?LDL (calc) mg/dL (calc)      ?HDL-C > OR = 40 mg/dL      ?Trlycerides <150 mg/dL      ?Hemoglobin A1c 4.8 - 5.6 %  4.5      ?PH, Arterial 7.350 - 7.450  7.391   7.386    ? 7.413    ? 7.408    ? 7.415    ? 7.388    ? 7.374    ? 7.425    ? 7.376   7.334    ? 7.334    ?PCO2 arterial 32.0 - 48.0 mmHg  43.0   43.8    ? 37.0    ? 39.2    ? 40.3    ? 43.1    ? 38.4    ? 40.1    ? 46.4   48.6    ? 47.0    ?Bicarbonate 20.0 - 28.0 mmol/L 27.3    ? 28.1   25.6   26.3    ? 24.3    ? 24.7    ? 25.9    ? 26.0    ? 22.4    ? 24.7    ? 26.3    ? 27.2   25.9    ? 25.0    ?TCO2 22 - 32 mmol/L 29    ? 30    28    ? 26    ? 26    ? 25    ? 27    ? 26    ? 27    ? 24    ? 24    ? 25    ? 26    ? 28    ? 25    ? 29    ? 29   27    ? 26    ?Acid-base deficit 0.0 - 2.0 mmol/L   1.0    ? 3.0   1.0    ?O2 Saturation % 72.0    ? 77.0   98.5   100.0    ? 100.0    ? 100.0    ? 100.0    ? 100.0    ? 100.0    ? 88.0    ? 100.0    ? 100.0   98.0    ? 96.0    ? ?  07/11/2021  ?Labs for ITP Cardiac and Pulmonary Rehab  ?Cholestrol 174    ?LDL (calc) 102    ?HDL-C 45    ?Trlycerides 174    ?Hemoglobin A1c   ?PH, Arterial   ?PCO2 arterial   ?Bicarbonate   ?TCO2   ?Acid-base deficit   ?O2 Saturation   ?  ? ? Multiple values from one  day are sorted in reverse-chronological order  ?  ?  ? ? ? ?Exercise Target Goals: ?Exercise Program Goal: ?Individual exercise prescription set using results from initial 6 min walk test and THRR while considering  patient?s activity barriers and safety.  ? ?Exercise Prescription Goal: ?Initial exercise prescription builds to 30-45 minutes a day of aerobic activity, 2-3 days per week.  Home exercise guidelines will be given to patient during program as part of exercise prescription that the participant will acknowledge. ? ? ?Education: Aerobic Exercise: ?- Group verbal and visual presentation on the components of exercise prescription. Introduces F.I.T.T principle from ACSM for exercise prescriptions.  Reviews F.I.T.T.  principles of aerobic exercise including progression. Written material given at graduation. ? ? ?Education: Resistance Exercise: ?- Group verbal and visual presentation on the components of exercise prescription. Introduces F.I.T.T principle from ACSM for exercise prescriptions  Reviews F.I.T.T. principles of resistance exercise including progression. Written material given at graduation. ?Flowsheet Row Cardiac Rehab from 09/27/2021 in [facility] Cardiac and Pulmonary Rehab  ?Date 09/27/21  ?Educator [place]  ?Instruction Review Code 1- Verbalizes Understanding  ? ?  ? ?  ?Education: Exercise & Equipment Safety: ?- Individual verbal instruction and demonstration of equipment use and safety with use of the equipment. ?Flowsheet Row Cardiac Rehab from 09/27/2021 in [facility] Cardiac and Pulmonary Rehab  ?Date 06/19/21  ?Educator [place]  ?Instruction Review Code 1- Verbalizes Understanding  ? ?  ? ? ?Education: Exercise Physiology & General Exercise Guidelines: ?- Group verbal and written instruction with models to review the exercise physiology of the cardiovascular system and associated critical values. Provides general exercise guidelines with specific guidelines to those with heart or lung disease.  ? ? ?Education: Flexibility, Balance, Mind/Body Relaxation: ?- Group verbal and visual presentation with interactive activity on the components of exercise prescription. Introduces F.I.T.T principle from ACSM for exercise prescriptions. Reviews F.I.T.T. principles of flexibility and balance exercise training including progression. Also discusses the mind body connection.  Reviews various relaxation techniques to help reduce and manage stress (i.e. Deep breathing, progressive muscle relaxation, and visualization). Balance handout provided to take home. Written material given at graduation. ? ? ?Activity Barriers & Risk Stratification: ? ? ?6 Minute Walk: ? 6 Minute Walk   ? ? [place] Name 06/19/21 1057 09/27/21 1732  ?  ?  ? 6 Minute Walk  ?  Phase Initial Discharge   ? Distance 1300 feet 1580 feet   ? Distance % Change -- 21.5 %   ? Distance Feet Change -- 280 ft   ? Walk Time 6 minutes 6 minutes   ? # of Rest Breaks 0 0   ? MPH 2.46 2.99   ? M

## 2021-10-05 ENCOUNTER — Encounter: Payer: 59 | Admitting: *Deleted

## 2021-10-05 DIAGNOSIS — Z952 Presence of prosthetic heart valve: Secondary | ICD-10-CM

## 2021-10-05 NOTE — Progress Notes (Signed)
DISCHARGE NOTE  [name] graduated today from  rehab with 36 sessions completed.  Details of the patient's exercise prescription and what [name] needs to do in order to continue the prescription and progress were discussed with patient.  Patient was given a copy of prescription and goals.  Patient verbalized understanding.  [name] plans to continue to exercise by walking at home.   [place] Name 06/19/21 1057 09/27/21 1732       6 Minute Walk   Phase Initial Discharge    Distance 1300 feet 1580 feet    Distance % Change -- 21.5 %    Distance Feet Change -- 280 ft    Walk Time 6 minutes 6 minutes    # of Rest Breaks 0 0    MPH 2.46 2.99    METS 4.2 4.46    RPE 7 8    VO2 Peak 14.7 15.62    Symptoms No No    Resting HR 80 bpm 79 bpm    Resting BP 100/70 118/66    Resting Oxygen Saturation  96 % 98 %    Exercise Oxygen Saturation  during 6 min walk 99 % --    Max Ex. HR 100 bpm 85 bpm    Max Ex. BP 124/78 120/78    2 Minute Post BP 104/64 --            Thank you for the referral. We enjoyed working with [name].

## 2021-10-05 NOTE — Progress Notes (Signed)
Daily Session Note  Patient Details  Name: [name] MRN: [id number] Date of Birth: 22-Feb-1976 Referring Provider:   Flowsheet Row Cardiac Rehab from 06/19/2021 in [facility] Cardiac and Pulmonary Rehab  Referring Provider Dr. Virl Axe       Encounter Date: 10/05/2021  Check In:  Session Check In - 10/05/21 1731       Check-In   Supervising physician immediately available to respond to emergencies See telemetry face sheet for immediately available ER MD    Location [facility]-Cardiac & Pulmonary Rehab    Staff Present Heath Lark, RN, BSN, Jacklynn Bue, MS, ASCM CEP, Exercise Physiologist;Joseph Tessie Fass, [place]    Virtual Visit No    Medication changes reported     No    Fall or balance concerns reported    No    Warm-up and Cool-down Performed on first and last piece of equipment    Resistance Training Performed Yes    VAD Patient? No    PAD/SET Patient? No      Pain Assessment   Currently in Pain? No/denies                Social History   Tobacco Use  Smoking Status Never  Smokeless Tobacco Never    Goals Met:  [place] with exercise equipment Exercise tolerated well Personal goals reviewed No report of concerns or symptoms today  Goals Unmet:  Not Applicable  Comments:  [name] graduated today from  rehab with 36 sessions completed.  Details of the patient's exercise prescription and what He needs to do in order to continue the prescription and progress were discussed with patient.  Patient was given a copy of prescription and goals.  Patient verbalized understanding.  [name] plans to continue to exercise by walking at home.    Dr. Emily Filbert is Medical Director for [place].  Dr. Ottie Glazier is Medical Director for [facility] Pulmonary Rehabilitation.

## 2021-10-05 NOTE — Progress Notes (Signed)
Cardiac Individual Treatment Plan  Patient Details  Name: [name] MRN: [id number] Date of Birth: [birth year] Referring Provider:   Flowsheet Row Cardiac Rehab from 06/19/2021 in [facility] Cardiac and Pulmonary Rehab  Referring Provider Dr. Virl Axe       Initial Encounter Date:  Flowsheet Row Cardiac Rehab from 06/19/2021 in [facility] Cardiac and Pulmonary Rehab  Date 06/19/21       Visit Diagnosis: S/P AVR (aortic valve replacement)  Patient's Home Medications on Admission:  Current Outpatient Medications:    acetaminophen (TYLENOL) 500 MG tablet, Take 500 mg by mouth every 6 (six) hours as needed for moderate pain., Disp: , Rfl:    aspirin EC 81 MG EC tablet, Take 1 tablet (81 mg total) by mouth daily. Swallow whole., Disp: 30 tablet, Rfl: 11   carvedilol (COREG) 6.25 MG tablet, Take 1 tablet (6.25 mg total) by mouth 2 (two) times daily., Disp: 180 tablet, Rfl: 3   empagliflozin (JARDIANCE) 10 MG TABS tablet, Take 1 tablet (10 mg total) by mouth daily before breakfast., Disp: 30 tablet, Rfl: 11   fluticasone (FLONASE) 50 MCG/ACT nasal spray, Place 2 sprays into both nostrils daily as needed for allergies., Disp: , Rfl:    fluticasone (FLONASE) 50 MCG/ACT nasal spray, Place into the nose., Disp: , Rfl:    loratadine (CLARITIN) 10 MG tablet, Take by mouth as needed., Disp: , Rfl:    magnesium oxide (MAG-OX) 400 MG tablet, Take 1 tablet (400 mg total) by mouth 2 (two) times daily., Disp: 180 tablet, Rfl: 3   meclizine (ANTIVERT) 25 MG tablet, Take 0.5-1 tablets (12.5-25 mg total) by mouth 3 (three) times daily as needed for dizziness., Disp: 30 tablet, Rfl: 2   Multiple Vitamin (MULTIVITAMIN) tablet, Take 2 tablets by mouth daily., Disp: , Rfl:    ondansetron (ZOFRAN-ODT) 4 MG disintegrating tablet, Take by mouth as needed., Disp: , Rfl:    oxyCODONE (OXY IR/ROXICODONE) 5 MG immediate release tablet, Take 1 tablet (5 mg total) by mouth every 4 (four) hours as needed for severe pain.,  Disp: 30 tablet, Rfl: 0   potassium chloride SA (KLOR-CON M) 20 MEQ tablet, Take 1 tablet (20 mEq total) by mouth daily. For 4 days then stop., Disp: 4 tablet, Rfl: 0   ranolazine (RANEXA) 1000 MG SR tablet, Take 1 tablet (1,000 mg total) by mouth 2 (two) times daily., Disp: 180 tablet, Rfl: 3   sacubitril-valsartan (ENTRESTO) 24-26 MG, Take 1 tablet by mouth 2 (two) times daily., Disp: 180 tablet, Rfl: 3   spironolactone (ALDACTONE) 25 MG tablet, Take 0.5 tablets (12.5 mg total) by mouth daily., Disp: 45 tablet, Rfl: 3   triamcinolone cream (KENALOG) 0.1 %, Apply 1 application topically 2 (two) times daily as needed., Disp: 30 g, Rfl: 0   triamcinolone cream (KENALOG) 0.1 %, Apply topically as needed., Disp: , Rfl:    umeclidinium bromide (INCRUSE ELLIPTA) 62.5 MCG/ACT AEPB, Inhale 1 puff into the lungs as needed., Disp: , Rfl:    warfarin (COUMADIN) 5 MG tablet, TAKE 1 AND 1/2 TABLETS BY MOUTH DAILY OR AS DIRECTED BY THE COUMADIN CLINIC, Disp: 50 tablet, Rfl: 3  Past Medical History: Past Medical History:  Diagnosis Date   AICD (automatic cardioverter/defibrillator) present    Anxiety    Aortic valve replaced    Requiring replacement, specifics not available   Complication of anesthesia    N & V   Dysrhythmia    Hearing loss of both ears 12/22/2015   Heart  murmur    Hypertension    IBD (inflammatory bowel disease) 09/23/2015   ICD (implantable cardiac defibrillator), BSX single    Myocardial infarction ([place])    Obesity 12/22/2015   PONV (postoperative nausea and vomiting)    Secondary cardiomyopathy ([place])    Syncope    Ventricular septal defect    Ventricular tachycardia ([place])    appropriate VT shock therapy /13    Tobacco Use: Social History   Tobacco Use  Smoking Status Never  Smokeless Tobacco Never    Labs: Review Flowsheet        Latest Ref Rng & Units 03/01/2021 04/25/2021 04/27/2021 04/28/2021  Labs for ITP Cardiac and Pulmonary Rehab  Cholestrol <200 mg/dL       LDL (calc) mg/dL (calc)      HDL-C > OR = 40 mg/dL      Trlycerides <150 mg/dL      Hemoglobin A1c 4.8 - 5.6 %  4.5      PH, Arterial 7.350 - 7.450  7.391   7.386     7.413     7.408     7.415     7.388     7.374     7.425     7.376   7.334     7.334    PCO2 arterial 32.0 - 48.0 mmHg  43.0   43.8     37.0     39.2     40.3     43.1     38.4     40.1     46.4   48.6     47.0    Bicarbonate 20.0 - 28.0 mmol/L 27.3     28.1   25.6   26.3     24.3     24.7     25.9     26.0     22.4     24.7     26.3     27.2   25.9     25.0    TCO2 22 - 32 mmol/L $RemoveB'29     30    28     26     26     25     27     26     27     24     24     25     26     28     25     29     29   27     26    'CHCILgaC$ Acid-base deficit 0.0 - 2.0 mmol/L   1.0     3.0   1.0    O2 Saturation % 72.0     77.0   98.5   100.0     100.0     100.0     100.0     100.0     100.0     88.0     100.0     100.0   98.0     96.0       07/11/2021  Labs for ITP Cardiac and Pulmonary Rehab  Cholestrol 174    LDL (calc) 102    HDL-C 45    Trlycerides 174    Hemoglobin A1c   PH, Arterial   PCO2 arterial   Bicarbonate   TCO2   Acid-base deficit   O2 Saturation       Multiple values from one  day are sorted in reverse-chronological order         Exercise Target Goals: Exercise Program Goal: Individual exercise prescription set using results from initial 6 min walk test and THRR while considering  patient's activity barriers and safety.   Exercise Prescription Goal: Initial exercise prescription builds to 30-45 minutes a day of aerobic activity, 2-3 days per week.  Home exercise guidelines will be given to patient during program as part of exercise prescription that the participant will acknowledge.   Education: Aerobic Exercise: - Group verbal and visual presentation on the components of exercise prescription. Introduces F.I.T.T principle from ACSM for exercise prescriptions.  Reviews F.I.T.T.  principles of aerobic exercise including progression. Written material given at graduation.   Education: Resistance Exercise: - Group verbal and visual presentation on the components of exercise prescription. Introduces F.I.T.T principle from ACSM for exercise prescriptions  Reviews F.I.T.T. principles of resistance exercise including progression. Written material given at graduation. Flowsheet Row Cardiac Rehab from 09/27/2021 in [facility] Cardiac and Pulmonary Rehab  Date 09/27/21  Educator [place]  Instruction Review Code 1- Verbalizes Understanding        Education: Exercise & Equipment Safety: - Individual verbal instruction and demonstration of equipment use and safety with use of the equipment. Flowsheet Row Cardiac Rehab from 09/27/2021 in [facility] Cardiac and Pulmonary Rehab  Date 06/19/21  Educator [facility]  Instruction Review Code 1- Verbalizes Understanding       Education: Exercise Physiology & General Exercise Guidelines: - Group verbal and written instruction with models to review the exercise physiology of the cardiovascular system and associated critical values. Provides general exercise guidelines with specific guidelines to those with heart or lung disease.    Education: Flexibility, Balance, Mind/Body Relaxation: - Group verbal and visual presentation with interactive activity on the components of exercise prescription. Introduces F.I.T.T principle from ACSM for exercise prescriptions. Reviews F.I.T.T. principles of flexibility and balance exercise training including progression. Also discusses the mind body connection.  Reviews various relaxation techniques to help reduce and manage stress (i.e. Deep breathing, progressive muscle relaxation, and visualization). Balance handout provided to take home. Written material given at graduation.   Activity Barriers & Risk Stratification:   6 Minute Walk:  6 Minute Walk     Row Name 06/19/21 1057 09/27/21 1732       6 Minute Walk    Phase Initial Discharge    Distance 1300 feet 1580 feet    Distance % Change -- 21.5 %    Distance Feet Change -- 280 ft    Walk Time 6 minutes 6 minutes    # of Rest Breaks 0 0    MPH 2.46 2.99    METS 4.2 4.46    RPE 7 8    VO2 Peak 14.7 15.62    Symptoms No No    Resting HR 80 bpm 79 bpm    Resting BP 100/70 118/66    Resting Oxygen Saturation  96 % 98 %    Exercise Oxygen Saturation  during 6 min walk 99 % --    Max Ex. HR 100 bpm 85 bpm    Max Ex. BP 124/78 120/78    2 Minute Post BP 104/64 --             Oxygen Initial Assessment:   Oxygen Re-Evaluation:   Oxygen Discharge (Final Oxygen Re-Evaluation):   Initial Exercise Prescription:  Initial Exercise Prescription - 06/19/21 1000       Date of Initial Exercise RX  and Referring Provider   Date 06/19/21    Referring Provider Dr. Virl Axe      Oxygen   Maintain Oxygen Saturation 88% or higher      Treadmill   MPH 3.5    Grade 1    Minutes 15    METs 4.16      Recumbant Bike   Level 3    RPM 60    Minutes 15    METs 4.2      Recumbant Elliptical   Level 2    RPM 50    Minutes 15    METs 4.2      REL-XR   Level 3    Speed 50    Minutes 15    METs 4.2      Prescription Details   Frequency (times per week) 3    Duration Progress to 30 minutes of continuous aerobic without signs/symptoms of physical distress      Intensity   THRR 40-80% of Max Heartrate 117-154    Ratings of Perceived Exertion 11-13    Perceived Dyspnea 0-4      Progression   Progression Continue to progress workloads to maintain intensity without signs/symptoms of physical distress.      Resistance Training   Training Prescription Yes    Weight 3    Reps 10-15             Perform Capillary Blood Glucose checks as needed.  Exercise Prescription Changes:   Exercise Prescription Changes     Row Name 06/19/21 1100 06/30/21 1100 07/05/21 1700 07/17/21 1200 08/01/21 1200     Response to Exercise    Blood Pressure (Admit) 100/70 -- 122/62 102/60 100/60   Blood Pressure (Exercise) 124/78 -- 126/64 144/60 --   Blood Pressure (Exit) 104/64 -- 118/62 102/68 98/60   Heart Rate (Admit) 80 bpm -- 89 bpm 90 bpm 85 bpm   Heart Rate (Exercise) 100 bpm -- 120 bpm 149 bpm 125 bpm   Heart Rate (Exit) 82 bpm -- 96 bpm 110 bpm 94 bpm   Oxygen Saturation (Admit) 96 % -- -- -- --   Oxygen Saturation (Exercise) 99 % -- -- -- --   Oxygen Saturation (Exit) 98 % -- -- -- --   Rating of Perceived Exertion (Exercise) 7 -- $Re'11 13 13   'QXS$ Symptoms none -- none none none   Comments 6 MWT results -- -- -- --   Duration -- -- Continue with 30 min of aerobic exercise without signs/symptoms of physical distress. Continue with 30 min of aerobic exercise without signs/symptoms of physical distress. Continue with 30 min of aerobic exercise without signs/symptoms of physical distress.   Intensity -- -- THRR unchanged THRR unchanged THRR unchanged     Progression   Progression -- -- Continue to progress workloads to maintain intensity without signs/symptoms of physical distress. Continue to progress workloads to maintain intensity without signs/symptoms of physical distress. Continue to progress workloads to maintain intensity without signs/symptoms of physical distress.   Average METs -- -- 4.7 4.52 4.5     Resistance Training   Training Prescription Yes -- Yes Yes Yes   Weight 3 -- 3 lb 3.0 10 lb   Reps 10-15 -- 10-15 10-15 10-15     Interval Training   Interval Training -- -- -- No No     Treadmill   MPH 3.5 -- 3.2 3.6 3.4   Grade 1 -- $Re'1 2 3   'lgd$ Minutes 15 --  15 15 15    METs 4.16 -- 3.89 4.75 5.01     Recumbant Bike   Level 3 -- -- -- --   RPM 60 -- -- -- --   Minutes 15 -- -- -- --   METs 4.2 -- -- -- --     Recumbant Elliptical   Level 2 -- -- -- --   RPM 50 -- -- -- --   Minutes 15 -- -- -- --   METs 4.2 -- -- -- --     Elliptical   Level -- -- -- 3 4   Minutes -- -- -- 15 15   METs -- -- -- 3.7  4.1     REL-XR   Level 3 -- 7 7 --   Speed 50 -- 50 -- --   Minutes 15 -- 15 15 --   METs 4.2 -- 5.5 6 --     Home Exercise Plan   Plans to continue exercise at -- Home (comment)  walking, elliptial, staff videos Home (comment)  walking, elliptial, staff videos Home (comment)  walking, elliptial, staff videos Home (comment)  walking, elliptial, staff videos   Frequency -- Add 2 additional days to program exercise sessions. Add 2 additional days to program exercise sessions. Add 2 additional days to program exercise sessions. Add 2 additional days to program exercise sessions.   Initial Home Exercises Provided -- 06/30/21 06/30/21 06/30/21 06/30/21    Row Name 08/14/21 1600 08/30/21 1500 09/15/21 0800 09/27/21 1700       Response to Exercise   Blood Pressure (Admit) 110/70 102/60 128/82 118/68    Blood Pressure (Exercise) 154/74 -- -- --    Blood Pressure (Exit) 112/60 104/64 110/68 120/70    Heart Rate (Admit) 85 bpm 86 bpm 75 bpm 81 bpm    Heart Rate (Exercise) 126 bpm 102 bpm 123 bpm 121 bpm    Heart Rate (Exit) 94 bpm 86 bpm 96 bpm 86 bpm    Oxygen Saturation (Admit) 98 % 96 % 97 % --    Oxygen Saturation (Exercise) 98 % 98 % 95 % --    Oxygen Saturation (Exit) 97 % 98 % 99 % --    Rating of Perceived Exertion (Exercise) 13 11 11 13     Symptoms none none none none    Duration Continue with 30 min of aerobic exercise without signs/symptoms of physical distress. Continue with 30 min of aerobic exercise without signs/symptoms of physical distress. Continue with 30 min of aerobic exercise without signs/symptoms of physical distress. Continue with 30 min of aerobic exercise without signs/symptoms of physical distress.    Intensity THRR unchanged THRR unchanged THRR unchanged THRR unchanged      Progression   Progression Continue to progress workloads to maintain intensity without signs/symptoms of physical distress. Continue to progress workloads to maintain intensity without  signs/symptoms of physical distress. Continue to progress workloads to maintain intensity without signs/symptoms of physical distress. Continue to progress workloads to maintain intensity without signs/symptoms of physical distress.    Average METs 4.61 3.15 4.11 4.95      Resistance Training   Training Prescription Yes Yes Yes Yes    Weight 10 lb 10 lb 12 lb 12 lb    Reps 10-15 10-15 10-15 10-15      Interval Training   Interval Training No No No No      Treadmill   MPH 3.7 3.2 3.3 3.4    Grade 2.5 0 2 3  Minutes '15 15 15 15    '$ METs 5.11 3.45 4.43 5.01      Elliptical   Level $Remo'4 4 4 4    'iWgZH$ Speed 2 -- 3.2 --    Minutes $Remove'15 15 15 15    'hHhEoVA$ METs 4.1 2.3 3.8 4.6      REL-XR   Level -- -- -- 8    Minutes -- -- -- 15    METs -- -- -- 6.5      Home Exercise Plan   Plans to continue exercise at Home (comment)  walking, elliptial, staff videos Home (comment)  walking, elliptial, staff videos Home (comment)  walking, elliptial, staff videos Home (comment)  walking, elliptial, staff videos    Frequency Add 2 additional days to program exercise sessions. Add 2 additional days to program exercise sessions. Add 2 additional days to program exercise sessions. Add 2 additional days to program exercise sessions.    Initial Home Exercises Provided 06/30/21 06/30/21 06/30/21 06/30/21      Oxygen   Maintain Oxygen Saturation 88% or higher 88% or higher 88% or higher 88% or higher             Exercise Comments:   Exercise Comments     Row Name 06/21/21 1110 10/05/21 1733         Exercise Comments First full day of exercise!  Patient was oriented to gym and equipment including functions, settings, policies, and procedures.  Patient's individual exercise prescription and treatment plan were reviewed.  All starting workloads were established based on the results of the 6 minute walk test done at initial orientation visit.  The plan for exercise progression was also introduced and progression  will be customized based on patient's performance and goals. Jayveon graduated today from  rehab with 36 sessions completed.  Details of the patient's exercise prescription and what He needs to do in order to continue the prescription and progress were discussed with patient.  Patient was given a copy of prescription and goals.  Patient verbalized understanding.  Klark plans to continue to exercise by walking at home.               Exercise Goals and Review:   Exercise Goals     Row Name 06/19/21 1103             Exercise Goals   Increase Physical Activity Yes       Intervention Provide advice, education, support and counseling about physical activity/exercise needs.;Develop an individualized exercise prescription for aerobic and resistive training based on initial evaluation findings, risk stratification, comorbidities and participant's personal goals.       Expected Outcomes Short Term: Attend rehab on a regular basis to increase amount of physical activity.;Long Term: Add in home exercise to make exercise part of routine and to increase amount of physical activity.;Long Term: Exercising regularly at least 3-5 days a week.       Increase Strength and Stamina Yes       Intervention Provide advice, education, support and counseling about physical activity/exercise needs.;Develop an individualized exercise prescription for aerobic and resistive training based on initial evaluation findings, risk stratification, comorbidities and participant's personal goals.       Expected Outcomes Short Term: Increase workloads from initial exercise prescription for resistance, speed, and METs.;Short Term: Perform resistance training exercises routinely during rehab and add in resistance training at home;Long Term: Improve cardiorespiratory fitness, muscular endurance and strength as measured by increased METs and functional capacity (6MWT)  Able to understand and use rate of perceived exertion (RPE)  scale Yes       Intervention Provide education and explanation on how to use RPE scale       Expected Outcomes Short Term: Able to use RPE daily in rehab to express subjective intensity level;Long Term:  Able to use RPE to guide intensity level when exercising independently       Able to understand and use Dyspnea scale Yes       Intervention Provide education and explanation on how to use Dyspnea scale       Expected Outcomes Short Term: Able to use Dyspnea scale daily in rehab to express subjective sense of shortness of breath during exertion;Long Term: Able to use Dyspnea scale to guide intensity level when exercising independently       Knowledge and understanding of Target Heart Rate Range (THRR) Yes       Intervention Provide education and explanation of THRR including how the numbers were predicted and where they are located for reference       Expected Outcomes Short Term: Able to state/look up THRR;Long Term: Able to use THRR to govern intensity when exercising independently;Short Term: Able to use daily as guideline for intensity in rehab       Able to check pulse independently Yes       Intervention Provide education and demonstration on how to check pulse in carotid and radial arteries.;Review the importance of being able to check your own pulse for safety during independent exercise       Expected Outcomes Short Term: Able to explain why pulse checking is important during independent exercise;Long Term: Able to check pulse independently and accurately       Understanding of Exercise Prescription Yes       Intervention Provide education, explanation, and written materials on patient's individual exercise prescription       Expected Outcomes Short Term: Able to explain program exercise prescription;Long Term: Able to explain home exercise prescription to exercise independently                Exercise Goals Re-Evaluation :  Exercise Goals Re-Evaluation     Row Name 06/21/21 1110  06/30/21 1116 07/05/21 1724 07/17/21 1212 08/01/21 1230     Exercise Goal Re-Evaluation   Exercise Goals Review Increase Physical Activity;Able to understand and use rate of perceived exertion (RPE) scale;Knowledge and understanding of Target Heart Rate Range (THRR);Understanding of Exercise Prescription;Increase Strength and Stamina;Able to understand and use Dyspnea scale;Able to check pulse independently Increase Physical Activity;Able to understand and use rate of perceived exertion (RPE) scale;Knowledge and understanding of Target Heart Rate Range (THRR);Understanding of Exercise Prescription;Increase Strength and Stamina;Able to understand and use Dyspnea scale;Able to check pulse independently Increase Physical Activity;Increase Strength and Stamina Increase Physical Activity;Increase Strength and Stamina Increase Physical Activity;Increase Strength and Stamina   Comments Reviewed RPE and dyspnea scales, THR and program prescription with pt today.  Pt voiced understanding and was given a copy of goals to take home. Audry Pili is doing well in rehab.  He is already walking for 30 min at home.  Reviewed home exercise with pt today.  Pt plans to walk and use elliptical at home for exercise. We also talked about using staff videos.  Reviewed THR, pulse, RPE, sign and symptoms, pulse oximetery and when to call 911 or MD.  Also discussed weather considerations and indoor options.  Pt voiced understanding. Samy has done well in session so far.  He reaches THR range and works in correct RPE.  Staff will continue to monitor progress. Juanluis continues to do well. He has been working on the elliptical at level 3 and tolerating it well. He is also now up to 4.65 METS on the treadmill. He would benefit from increasing his handweights to at least 4 lbs. Will continue to monitor. Stan is progressing well.  He has increased level on elliptical and speed and incline on TM.  He uses 10 lb for strength work.  We will continue  to monitor progress.   Expected Outcomes Short: Use RPE daily to regulate intensity. Long: Follow program prescription in THR. Short; Start to add in walking and elliptical at home Long: Conitnue to improve stamina. Short: attend consistently  Long:  improve overall MET level Short: Work up tolerance on elliptical Long: Continue to increase overall MET level Short:  maintain consistent attendance Long: conitnue to improve MET level    Row Name 08/14/21 1602 08/24/21 1726 08/30/21 1557 09/15/21 0828 09/27/21 1701     Exercise Goal Re-Evaluation   Exercise Goals Review Increase Physical Activity;Increase Strength and Stamina;Understanding of Exercise Prescription Increase Physical Activity;Increase Strength and Stamina;Understanding of Exercise Prescription Increase Physical Activity;Increase Strength and Stamina Increase Physical Activity;Increase Strength and Stamina;Understanding of Exercise Prescription Increase Physical Activity;Increase Strength and Stamina;Understanding of Exercise Prescription   Comments Jarreau continues to do well in rehab since switching to evening classes due to work.  He is up to level 4 on the elliptical now.  We will continue to montior his progress. Jequan is walking outside outside about 1-1.5 miles every other day on the off days from rehab. He also does the elliptical on the weekends for about 20 minutes. I encouraged him to hit the minimum 30 minutes during exercise. He does check his HR at home by using his smart watch. We reviewed his [place] and knows where his goal should be. Corrado attends consistently.  He completes full time on elliptical.  Staff reviewed THR range - we will contintue to monitor. Sohil has been out recently but returned on Monday.  We will continue to encourage better attendance and monitor his progress. Amanuel has continued back with better attendance. His overall METS have increased to almost 5! He is due for his post 6MWT and hope to see significant  improvement. Will continue to monitor.   Expected Outcomes Short: Continue to attend regularly even with work Long: Continue to improve stamina Short: Continue to check HR during exercise Long: Continue to exercise independently at home at appropriate prescription Short: reach THR range all workouts Long:  complete HT program Short: Return to consistent attendance Long: conitnue to improve stamina Short: Improve on post 6MWT Long: Continue to increase overall MET level    Row Name 09/27/21 1750             Exercise Goal Re-Evaluation   Exercise Goals Review Increase Physical Activity;Increase Strength and Stamina;Understanding of Exercise Prescription       Comments Denyse Amass is doing well. He is going to graduate in the next 3 sessions. He will continue to exercise by walking outside and use weights with pounds up to 35 lbs. Has pulse ox to watch his HR which he intends to keep doing. He has enjoyed this program and really felt his stamina increase.       Expected Outcomes Long: Graduate Long: Exercise independently at home  Discharge Exercise Prescription (Final Exercise Prescription Changes):  Exercise Prescription Changes - 09/27/21 1700       Response to Exercise   Blood Pressure (Admit) 118/68    Blood Pressure (Exit) 120/70    Heart Rate (Admit) 81 bpm    Heart Rate (Exercise) 121 bpm    Heart Rate (Exit) 86 bpm    Rating of Perceived Exertion (Exercise) 13    Symptoms none    Duration Continue with 30 min of aerobic exercise without signs/symptoms of physical distress.    Intensity THRR unchanged      Progression   Progression Continue to progress workloads to maintain intensity without signs/symptoms of physical distress.    Average METs 4.95      Resistance Training   Training Prescription Yes    Weight 12 lb    Reps 10-15      Interval Training   Interval Training No      Treadmill   MPH 3.4    Grade 3    Minutes 15    METs 5.01       Elliptical   Level 4    Minutes 15    METs 4.6      REL-XR   Level 8    Minutes 15    METs 6.5      Home Exercise Plan   Plans to continue exercise at Home (comment)   walking, elliptial, staff videos   Frequency Add 2 additional days to program exercise sessions.    Initial Home Exercises Provided 06/30/21      Oxygen   Maintain Oxygen Saturation 88% or higher             Nutrition:  Target Goals: Understanding of nutrition guidelines, daily intake of sodium '1500mg'$ , cholesterol '200mg'$ , calories 30% from fat and 7% or less from saturated fats, daily to have 5 or more servings of fruits and vegetables.  Education: All About Nutrition: -Group instruction provided by verbal, written material, interactive activities, discussions, models, and posters to present general guidelines for heart healthy nutrition including fat, fiber, MyPlate, the role of sodium in heart healthy nutrition, utilization of the nutrition label, and utilization of this knowledge for meal planning. Follow up email sent as well. Written material given at graduation. Flowsheet Row Cardiac Rehab from 09/27/2021 in [facility] [place] Cardiac and Pulmonary Rehab  Education need identified 06/19/21       Biometrics:  Pre Biometrics - 06/19/21 1103       Pre Biometrics   Height 5' 10.75" (1.797 m)    Weight 191 lb 8 oz (86.9 kg)    BMI (Calculated) 26.9    Single Leg Stand 30 seconds             Post Biometrics - 09/27/21 1736        Post  Biometrics   Height 5' 10.75" (1.797 m)    Weight 198 lb 6.4 oz (90 kg)    BMI (Calculated) 27.87             Nutrition Therapy Plan and Nutrition Goals:  Nutrition Therapy & Goals - 07/05/21 1349       Nutrition Therapy   Diet Heart healthy, low Na    Drug/Food Interactions Coumadin/Vit K    Protein (specify units) 70g    Fiber 30 grams    Whole Grain Foods 3 servings    Saturated Fats 12 max. grams    Fruits and Vegetables 8 servings/day    Sodium 2  grams       Personal Nutrition Goals   Nutrition Goal ST: practice meal planning and ingredient preppring - start with a couple of versatile ingredients LT: 8 servings fruits/vegetables per day, Follow MyPlate structure    Comments 46 y.o. M admitted to rehab for s/p AVR also presenting with HTN, CAD, ICD (placement 2017). PMHx IBD (unspecified in problem list). PSHx cholecystectomy, CABG. Relevant medications include warfarin, coreg, mag-ox, MVI, zofran, potassium, torsemide. Per MD note, 06/20/21, pt would like to get back to a vegan diet. PYP Score: 64. Vegetables & Fruits 7/12. Breads, Grains & Cereals 8/12. Red & Processed Meat 12/12. Poultry 0/2. Fish & Shellfish 1/4. Beans, Nuts & Seeds 0/4. Milk & Dairy Foods 5/6. Toppings, Oils, Seasonings & Salt 15/20. Sweets, Snacks & Restaurant Food 8/14. Beverages 8/10. He does not eat red meat, mainly poultry and fish. He was eating more vegan and now is not due to the warfarin. Encouraged that with warfarin and vitamin K consistency is key. He was and continues to take a mutlivitamin. He feels like his biggest issue is snacks - usually oranges and apples, but sometimes has a sweet tooth. B: Veggie patty and eggwhites or oatmeal or cheerios. L: fruit, small portion of chicken and vegetables S: candy bar D: Pizza, buffalo wings, meat with vegetables and potato. Discussed honoring hunger and meal planning tips. Cale is open to practicing meal planning skills. Discussed general heart healthy eating.      Intervention Plan   Intervention Prescribe, educate and counsel regarding individualized specific dietary modifications aiming towards targeted core components such as weight, hypertension, lipid management, diabetes, heart failure and other comorbidities.    Expected Outcomes Short Term Goal: Understand basic principles of dietary content, such as calories, fat, sodium, cholesterol and nutrients.;Short Term Goal: A plan has been developed with personal nutrition goals  set during dietitian appointment.;Long Term Goal: Adherence to prescribed nutrition plan.             Nutrition Assessments:  MEDIFICTS Score Key: ?70 Need to make dietary changes  40-70 Heart Healthy Diet ? 40 Therapeutic Level Cholesterol Diet  Flowsheet Row Cardiac Rehab from 06/19/2021 in [facility] Cardiac and Pulmonary Rehab  Picture Your Plate Total Score on Admission 64      Picture Your Plate Scores: <00 Unhealthy dietary pattern with much room for improvement. 41-50 Dietary pattern unlikely to meet recommendations for good health and room for improvement. 51-60 More healthful dietary pattern, with some room for improvement.  >60 Healthy dietary pattern, although there may be some specific behaviors that could be improved.    Nutrition Goals Re-Evaluation:  Nutrition Goals Re-Evaluation     [place] Name 06/30/21 1120 07/27/21 1723 08/24/21 1745 09/27/21 1756       Goals   Nutrition Goal Meet with dietitian Continue diet plan ST: practice meal planning and ingredient preppring - start with a couple of versatile ingredients LT: 8 servings fruits/vegetables per day, Follow MyPlate structure ST: practice meal planning and ingredient preppring - start with a couple of versatile ingredients LT: 8 servings fruits/vegetables per day, Follow MyPlate structure    Comment Orion is scheduled to meet with dietitian next week over the phone. Ramses states that he has always had a pretty good diet and is happy with his weight. He has no comments of issues with his diet at this time. Teal admits that he has not made many too changes to his diet so far. He is interested in turning back to  a vegan diet and is interested in having the RD provide him with recomennded recipes or help. Staff member will notify RD of requested information. Evelena Peat received new vegatarian recipes from the RD. He admits he hasn't tried them yet because he hashn't had time with work, We talked about meal prepping,  pre-cutting vegetables and pre-planning grocery lists to help with time. Patient is graduating next week and denied other questions for the RD at this time.    Expected Outcome Discuss nutrition Short: maintain current diet. Long: adhere and maintain a heart healthy diet. Short: Follow up with RD regarding switch to vegan diet Long: Continue to follow heart healthy diet Short: Graduate Long: Continue to eat heart healthy diet             Nutrition Goals Discharge (Final Nutrition Goals Re-Evaluation):  Nutrition Goals Re-Evaluation - 09/27/21 1756       Goals   Nutrition Goal ST: practice meal planning and ingredient preppring - start with a couple of versatile ingredients LT: 8 servings fruits/vegetables per day, Follow MyPlate structure    Comment Caid received new vegatarian recipes from the RD. He admits he hasn't tried them yet because he hashn't had time with work, We talked about meal prepping, pre-cutting vegetables and pre-planning grocery lists to help with time. Patient is graduating next week and denied other questions for the RD at this time.    Expected Outcome Short: Graduate Long: Continue to eat heart healthy diet             Psychosocial: Target Goals: Acknowledge presence or absence of significant depression and/or stress, maximize coping skills, provide positive support system. Participant is able to verbalize types and ability to use techniques and skills needed for reducing stress and depression.   Education: Stress, Anxiety, and Depression - Group verbal and visual presentation to define topics covered.  Reviews how body is impacted by stress, anxiety, and depression.  Also discusses healthy ways to reduce stress and to treat/manage anxiety and depression.  Written material given at graduation.   Education: Sleep Hygiene -Provides group verbal and written instruction about how sleep can affect your health.  Define sleep hygiene, discuss sleep cycles and impact  of sleep habits. Review good sleep hygiene tips.    Initial Review & Psychosocial Screening:  Initial Psych Review & Screening - 05/25/21 1008       Initial Review   Current issues with None Identified      Family Dynamics   Good Support System? Yes    Comments He can look to his whole family for support. He has a good outlook on his health. He has not done much exercise before his event and is ready to get healthier.      Barriers   Psychosocial barriers to participate in program There are no identifiable barriers or psychosocial needs.;The patient should benefit from training in stress management and relaxation.      Screening Interventions   Interventions Encouraged to exercise;To provide support and resources with identified psychosocial needs;Provide feedback about the scores to participant    Expected Outcomes Short Term goal: Utilizing psychosocial counselor, staff and physician to assist with identification of specific Stressors or current issues interfering with healing process. Setting desired goal for each stressor or current issue identified.;Long Term Goal: Stressors or current issues are controlled or eliminated.;Short Term goal: Identification and review with participant of any Quality of Life or Depression concerns found by scoring the questionnaire.;Long Term goal: The participant  improves quality of Life and PHQ9 Scores as seen by post scores and/or verbalization of changes             Quality of Life Scores:   Quality of Life - 06/19/21 1108       Quality of Life   Select Quality of Life      Quality of Life Scores   Health/Function Pre 20.57 %    Socioeconomic Pre 23.57 %    Psych/Spiritual Pre 24.43 %    Family Pre 22.9 %    GLOBAL Pre 22.32 %            Scores of 19 and below usually indicate a poorer quality of life in these areas.  A difference of  2-3 points is a clinically meaningful difference.  A difference of 2-3 points in the total score of  the Quality of Life Index has been associated with significant improvement in overall quality of life, self-image, physical symptoms, and general health in studies assessing change in quality of life.  PHQ-9: Review Flowsheet        07/11/2021 06/19/2021 02/25/2020 02/17/2020 05/04/2019  Depression screen PHQ 2/9  Decreased Interest 0 0 0 0 0  Down, Depressed, Hopeless 0 0 0 0 0  PHQ - 2 Score 0 0 0 0 0  Altered sleeping 0 0 0  0  Tired, decreased energy 0 0 0  0  Change in appetite 0 0 0  0  Feeling bad or failure about yourself  0 0 0  0  Trouble concentrating 0 0 0  0  Moving slowly or fidgety/restless 0 0 0  0  Suicidal thoughts 0 0 0  0  PHQ-9 Score 0 0 0  0  Difficult doing work/chores Not difficult at all Not difficult at all Not difficult at all  Not difficult at all         Interpretation of Total Score  Total Score Depression Severity:  1-4 = Minimal depression, 5-9 = Mild depression, 10-14 = Moderate depression, 15-19 = Moderately severe depression, 20-27 = Severe depression   Psychosocial Evaluation and Intervention:  Psychosocial Evaluation - 05/25/21 1009       Psychosocial Evaluation & Interventions   Interventions Encouraged to exercise with the program and follow exercise prescription;Relaxation education;Stress management education    Comments He can look to his whole family for support. He has a good outlook on his health. He has not done much exercise before his event and is ready to get healthier. He works in Engineer, technical sales.    Expected Outcomes Short: Start [facility] to help with mood. Long: Maintain a healthy mental state    Continue Psychosocial Services  Follow up required by staff             Psychosocial Re-Evaluation:  Psychosocial Re-Evaluation     [place] Name 05/25/21 1010 06/30/21 1117 07/27/21 1715 08/24/21 1738 09/27/21 1752     Psychosocial Re-Evaluation   Current issues with -- None Identified None Identified None Identified None Identified    Comments -- Garvin is doing well in rehab to start.  He denies any current major stressors.  His family is very supportive.  His biggest struggle is trying to balance out medications. He is sleeping okay for the most part.  He does have some rough nights, but okay overall and getting at least 6 hours a night. Patient reports no issues with their current mental states, sleep, stress, depression or anxiety. Will follow up with  patient in a few weeks for any changes. Lori says he is doing well mentally. He is not taking any medications for depression or anxiety and does not feel the need to. He says his job can be stressful at times but thinks he manages it well. He enjoys exercising and using the elliptical at rehab. He feels stronger & his endurance has improved since starting the program and is his pleased with his progress thus far. He denies any other concerns at this time. Denyse Amass is doing well mentally. He denies any new issues, concerns, or problems. 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 He has been dealing a lot of stress from work but feels like he manages it well.   Expected Outcomes -- Short: Continue to work on improving sleep Long: continue to focus on positive. Short: Continue to exercise regularly to support mental health and notify staff of any changes. Long: maintain mental health and well being through teaching of rehab or prescribed medications independently. Short: Continue routine attendance with rehab Long: Continue to maintain positive attitude and utilize exercise for stress management Short: Graduate Long: Continue to maintain posititve attitude   Interventions -- Encouraged to attend Cardiac Rehabilitation for the exercise;Stress management education Encouraged to attend Cardiac Rehabilitation for the exercise Encouraged to attend Cardiac Rehabilitation for the exercise Encouraged to attend Cardiac Rehabilitation for the exercise   Continue  Psychosocial Services  -- Follow up required by staff Follow up required by staff Follow up required by staff Follow up required by staff            Psychosocial Discharge (Final Psychosocial Re-Evaluation):  Psychosocial Re-Evaluation - 09/27/21 1752       Psychosocial Re-Evaluation   Current issues with None Identified    Comments Denyse Amass is doing well mentally. He denies any new issues, concerns, or problems. 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 He has been dealing a lot of stress from work but feels like he manages it well.    Expected Outcomes Short: Graduate Long: Continue to maintain posititve attitude    Interventions Encouraged to attend Cardiac Rehabilitation for the exercise    Continue Psychosocial Services  Follow up required by staff             Vocational Rehabilitation: Provide vocational rehab assistance to qualifying candidates.   Vocational Rehab Evaluation & Intervention:   Education: Education Goals: Education classes will be provided on a variety of topics geared toward better understanding of heart health and risk factor modification. Participant will state understanding/return demonstration of topics presented as noted by education test scores.  Learning Barriers/Preferences:  Learning Barriers/Preferences - 05/25/21 1007       Learning Barriers/Preferences   Learning Barriers None    Learning Preferences None             General Cardiac Education Topics:  AED/CPR: - Group verbal and written instruction with the use of models to demonstrate the basic use of the AED with the basic ABC's of resuscitation.   Anatomy and Cardiac Procedures: - Group verbal and visual presentation and models provide information about basic cardiac anatomy and function. Reviews the testing methods done to diagnose heart disease and the outcomes of the test results. Describes the treatment choices: Medical Management,  Angioplasty, or Coronary Bypass Surgery for treating various heart conditions including Myocardial Infarction, Angina, Valve Disease, and Cardiac Arrhythmias.  Written  material given at graduation. Flowsheet Row Cardiac Rehab from 09/27/2021 in [facility] Cardiac and Pulmonary Rehab  Date 09/27/21  Educator SB  Instruction Review Code 1- Verbalizes Understanding       Medication Safety: - Group verbal and visual instruction to review commonly prescribed medications for heart and lung disease. Reviews the medication, class of the drug, and side effects. Includes the steps to properly store meds and maintain the prescription regimen.  Written material given at graduation.   Intimacy: - Group verbal instruction through game format to discuss how heart and lung disease can affect sexual intimacy. Written material given at graduation..   Know Your Numbers and Heart Failure: - Group verbal and visual instruction to discuss disease risk factors for cardiac and pulmonary disease and treatment options.  Reviews associated critical values for Overweight/Obesity, Hypertension, Cholesterol, and Diabetes.  Discusses basics of heart failure: signs/symptoms and treatments.  Introduces Heart Failure Zone chart for action plan for heart failure.  Written material given at graduation. Flowsheet Row Cardiac Rehab from 09/27/2021 in [facility] Cardiac and Pulmonary Rehab  Date 06/21/21  Educator [facility]  Instruction Review Code 1- Verbalizes Understanding       Infection Prevention: - Provides verbal and written material to individual with discussion of infection control including proper hand washing and proper equipment cleaning during exercise session. Flowsheet Row Cardiac Rehab from 09/27/2021 in [facility] Cardiac and Pulmonary Rehab  Date 06/19/21  Educator [facility]  Instruction Review Code 1- Verbalizes Understanding       Falls Prevention: - Provides verbal and written material to individual with discussion of falls  prevention and safety. Flowsheet Row Cardiac Rehab from 09/27/2021 in [facility] Cardiac and Pulmonary Rehab  Date 06/19/21  Educator [facility]  Instruction Review Code 1- Verbalizes Understanding       Other: -Provides group and verbal instruction on various topics (see comments)   Knowledge Questionnaire Score:  Knowledge Questionnaire Score - 06/19/21 1108       Knowledge Questionnaire Score   Pre Score 25/26             Core Components/Risk Factors/Patient Goals at Admission:  Personal Goals and Risk Factors at Admission - 06/19/21 1105       Core Components/Risk Factors/Patient Goals on Admission    Weight Management Yes;Weight Loss    Intervention Weight Management: Develop a combined nutrition and exercise program designed to reach desired caloric intake, while maintaining appropriate intake of nutrient and fiber, sodium and fats, and appropriate energy expenditure required for the weight goal.;Weight Management: Provide education and appropriate resources to help participant work on and attain dietary goals.;Weight Management/Obesity: Establish reasonable short term and long term weight goals.    Admit Weight 191 lb 8 oz (86.9 kg)    Goal Weight: Short Term 185 lb (83.9 kg)    Goal Weight: Long Term 185 lb (83.9 kg)    Expected Outcomes Long Term: Adherence to nutrition and physical activity/exercise program aimed toward attainment of established weight goal;Short Term: Continue to assess and modify interventions until short term weight is achieved;Understanding recommendations for meals to include 15-35% energy as protein, 25-35% energy from fat, 35-60% energy from carbohydrates, less than $RemoveB'200mg'SucjvwWm$  of dietary cholesterol, 20-35 gm of total fiber daily;Understanding of distribution of calorie intake throughout the day with the consumption of 4-5 meals/snacks;Weight Loss: Understanding of general recommendations for a balanced deficit meal plan, which promotes 1-2 lb weight loss per week  and includes a negative energy balance of [phone] kcal/d  Heart Failure Yes    Intervention Provide a combined exercise and nutrition program that is supplemented with education, support and counseling about heart failure. Directed toward relieving symptoms such as shortness of breath, decreased exercise tolerance, and extremity edema.    Expected Outcomes Improve functional capacity of life;Short term: Attendance in program 2-3 days a week with increased exercise capacity. Reported lower sodium intake. Reported increased fruit and vegetable intake. Reports medication compliance.;Short term: Daily weights obtained and reported for increase. Utilizing diuretic protocols set by physician.;Long term: Adoption of self-care skills and reduction of barriers for early signs and symptoms recognition and intervention leading to self-care maintenance.    Hypertension Yes    Intervention Provide education on lifestyle modifcations including regular physical activity/exercise, weight management, moderate sodium restriction and increased consumption of fresh fruit, vegetables, and low fat dairy, alcohol moderation, and smoking cessation.;Monitor prescription use compliance.    Expected Outcomes Short Term: Continued assessment and intervention until BP is < 140/50mm HG in hypertensive participants. < 130/100mm HG in hypertensive participants with diabetes, heart failure or chronic kidney disease.;Long Term: Maintenance of blood pressure at goal levels.             Education:Diabetes - Individual verbal and written instruction to review signs/symptoms of diabetes, desired ranges of glucose level fasting, after meals and with exercise. Acknowledge that pre and post exercise glucose checks will be done for 3 sessions at entry of program.   Core Components/Risk Factors/Patient Goals Review:   Goals and Risk Factor Review     Row Name 06/30/21 1120 07/27/21 1716 08/24/21 1729 09/27/21 1754       Core  Components/Risk Factors/Patient Goals Review   Personal Goals Review Weight Management/Obesity;Hypertension;Lipids;Heart Failure Weight Management/Obesity;Hypertension Weight Management/Obesity;Hypertension;Heart Failure Weight Management/Obesity;Hypertension;Heart Failure    Review Prestyn is doing well in rehab.  His weight has crept up a little bit.  We talked about using some of his extra lasix to help get fluid off as well.  His pressures are stabilizing since switching up his meds some, so he is pleased with this.  He checks it about 2-3x a day at home and keeping a log.  Other than his weight, he has not had any heart failure symptoms. Vandy has had good vital signs since starting the program. His blood pressure is doing well and within normal limits. He has progressed to off the monitor status. His heart rate and oxygen are within normal range for his age group. Hendry is doing well- he denies any heart failure symptoms at this time. He weighs himself daily but states his weight fluctuates up and down. He is aware to make note if he gains weight in a short period of time which could be fluid.He knows to notify his doctor  as well. He takes his BP at home which is usually 120s/80s. They are stable at rehab as well. Staying compliant with medications. Kasra is doing well. He is graduating next week. He still denies symptoms with heart failure. He checks his weight and says he fluctuates sometimes 5-10 lbs. We talked about importance to make sure he is aware of fluid gain and to notify his doctor of any sudden weight gain, especially that he has a history of heart failure. His  BP at rehab and home have been stable.    Expected Outcomes Short: Work on weight loss Long: continue to manage heart failure symptoms. Short: continue to attened rehan regularly. Long: Maintain vital signs and exercsie independently. Short: Continue to  monitor weight and notify doctor of any weight gain Long: Continue to manage  lifestyle risk factors Short: Graduate Long: Continue to manage risk factors             Core Components/Risk Factors/Patient Goals at Discharge (Final Review):   Goals and Risk Factor Review - 09/27/21 1754       Core Components/Risk Factors/Patient Goals Review   Personal Goals Review Weight Management/Obesity;Hypertension;Heart Failure    Review Dwane is doing well. He is graduating next week. He still denies symptoms with heart failure. He checks his weight and says he fluctuates sometimes 5-10 lbs. We talked about importance to make sure he is aware of fluid gain and to notify his doctor of any sudden weight gain, especially that he has a history of heart failure. His  BP at rehab and home have been stable.    Expected Outcomes Short: Graduate Long: Continue to manage risk factors             ITP Comments:  ITP Comments     Row Name 05/25/21 1014 06/19/21 1056 06/21/21 1110 07/05/21 1433 07/12/21 0848   ITP Comments Virtual Visit completed. Patient informed on EP and RD appointment and 6 Minute walk test. Patient also informed of patient health questionnaires on My Chart. Patient Verbalizes understanding. Visit diagnosis can be found in [facility] 04/27/2021. Completed 6MWT and gym orientation. Initial ITP created and sent for review to Dr. Emily Filbert, Medical Director. First full day of exercise!  Patient was oriented to gym and equipment including functions, settings, policies, and procedures.  Patient's individual exercise prescription and treatment plan were reviewed.  All starting workloads were established based on the results of the 6 minute walk test done at initial orientation visit.  The plan for exercise progression was also introduced and progression will be customized based on patient's performance and goals. Completed initial RD consultation 30 Day review completed. Medical Director ITP review done, changes made as directed, and signed approval by Medical Director.    [place]  Name 08/09/21 0840 09/06/21 1343 09/12/21 1536 10/04/21 0846 10/05/21 1733   ITP Comments 30 Day review completed. Medical Director ITP review done, changes made as directed, and signed approval by Medical Director. 30 Day review completed. Medical Director ITP review done, changes made as directed, and signed approval by Medical Director. Patient hasn't been here in a week. Called to follow up and patient stated he has been out for personal problems. States he plans to be here tomorrow, 4/26. 30 Day review completed. Medical Director ITP review done, changes made as directed, and signed approval by Medical Director. Devanta graduated today from  rehab with 36 sessions completed.  Details of the patient's exercise prescription and what He needs to do in order to continue the prescription and progress were discussed with patient.  Patient was given a copy of prescription and goals.  Patient verbalized understanding.  [place] plans to continue to exercise by walking at home.            Comments: Discharge ITP

## 2021-10-20 ENCOUNTER — Telehealth: Payer: Self-pay

## 2021-10-20 NOTE — Telephone Encounter (Signed)
BSC alert Antitachycardia pacing (ATP) therapy delivered to convert arrhythmia Event occurred 6/1 @ 09:55, EGM shows abrupt onset of sustained VT, HR 208, ATP delivered x1 slowing to regular VS. There are an additional 4 NSVT from previous dates, no therapy Route to triage per protocol LA  Successful telephone call to patient to assess for s/s VT. Patient states he was standing at the time and begin to feel his heart race. No loss of consciousness. Only mild lightheadedness. No chest pain or pressure. Patient remains compliant with coreg 6.'25mg'$  BID, entresto 24-'26mg'$  BID, and mag-ox '400mg'$  BID. [place] driving law and Shock plan reviewed. 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 Caryl Comes stating he must work remotely for the next 6 months.     Presenting rhythm

## 2021-10-24 ENCOUNTER — Encounter: Payer: Self-pay | Admitting: *Deleted

## 2021-10-24 NOTE — Telephone Encounter (Signed)
Letter completed. I have called the patient and notified him that this is available in his 69. He read over the initial letter and asked the "cardiac condition" be changed to "medical condition." His letter has been addended and resent to his MyChart.   I asked the patient to please call back with if there is any other documentation that is needed. He voices understanding and is agreeable.

## 2021-11-02 ENCOUNTER — Telehealth: Payer: Self-pay | Admitting: Internal Medicine

## 2021-11-02 NOTE — Telephone Encounter (Signed)
Please see prior telephone encounter from today for further detail.

## 2021-11-02 NOTE — Telephone Encounter (Signed)
[address] [place] [address]   To Whom It May Concern,  Due to Mr. Nyquist' (August 01, 1975) medical condition, he cannot drive for 6 months. I am asking that you allow him to work remotely during this 6 month period as his condition does not otherwise inhibit him from performing his job duties.   If you have any questions or concerns, please don't hesitate to call.  Sincerely,    Virl Axe, MD

## 2021-11-02 NOTE — Telephone Encounter (Signed)
Follow Up:     Patient is returning Catherine's call, concerning his work restrictions.

## 2021-11-02 NOTE — Telephone Encounter (Signed)
Called and spoke with pt. Pt states that he found the note in mychart prior to my return call.  Pt voices understanding regarding driving restrictions.  Pt requests that I also mail letter to him.  Home address confirmed with pt and letter mailed.   Pt appreciative and has no further questions at this time.

## 2021-11-02 NOTE — Telephone Encounter (Signed)
New Message:     Patient says he need to know his restrictions for driving please. He said he thought he had them, but he can not find them

## 2021-11-08 ENCOUNTER — Ambulatory Visit: Payer: 59

## 2021-11-08 DIAGNOSIS — Z7901 Long term (current) use of anticoagulants: Secondary | ICD-10-CM

## 2021-11-08 DIAGNOSIS — I350 Nonrheumatic aortic (valve) stenosis: Secondary | ICD-10-CM

## 2021-11-08 DIAGNOSIS — Z952 Presence of prosthetic heart valve: Secondary | ICD-10-CM | POA: Diagnosis not present

## 2021-11-08 DIAGNOSIS — Z5181 Encounter for therapeutic drug level monitoring: Secondary | ICD-10-CM | POA: Diagnosis not present

## 2021-11-08 LAB — POCT INR: INR: 2.1 (ref 2.0–3.0)

## 2021-11-08 NOTE — Patient Instructions (Signed)
Continue taking warfarin 1.5 tablets daily. Recheck INR in 6 weeks. Per Dr Bartle's note 07/05/21 pt has ON-X valve placed 04/27/21, after 3 months can decrease INR goal 1.5-2.0 with 81mg ASA.  Be consistent with green (2 times per week)  [facility] [phone] 

## 2021-11-09 ENCOUNTER — Ambulatory Visit (INDEPENDENT_AMBULATORY_CARE_PROVIDER_SITE_OTHER): Payer: 59

## 2021-11-09 DIAGNOSIS — I255 Ischemic cardiomyopathy: Secondary | ICD-10-CM | POA: Diagnosis not present

## 2021-11-09 LAB — CUP PACEART REMOTE DEVICE CHECK
Battery Remaining Longevity: 60 mo
Battery Remaining Percentage: 63 %
Brady Statistic RV Percent Paced: 0 %
Date Time Interrogation Session: 20230622030200
HighPow Impedance: 55 Ohm
Implantable Lead Implant Date: 20080214
Implantable Lead Location: 753860
Implantable Lead Model: 185
Implantable Lead Serial Number: 178017
Implantable Pulse Generator Implant Date: 20141216
Lead Channel Impedance Value: 677 Ohm
Lead Channel Pacing Threshold Amplitude: 1.1 V
Lead Channel Pacing Threshold Pulse Width: 0.4 ms
Lead Channel Setting Pacing Amplitude: 2 V
Lead Channel Setting Pacing Pulse Width: 0.4 ms
Lead Channel Setting Sensing Sensitivity: 0.6 mV
Pulse Gen Serial Number: 126781

## 2021-11-17 NOTE — Progress Notes (Signed)
Remote ICD transmission.   

## 2021-11-22 ENCOUNTER — Encounter: Payer: Self-pay | Admitting: Surgery

## 2021-11-22 ENCOUNTER — Encounter: Payer: Self-pay | Admitting: Cardiovascular Disease

## 2021-11-22 NOTE — Telephone Encounter (Signed)
This is fine. OK to have dental cleaning. Needs SBE prophylaxis. thanks

## 2021-11-23 ENCOUNTER — Encounter: Payer: Self-pay | Admitting: Cardiovascular Disease

## 2021-11-23 MED ORDER — AMOXICILLIN 500 MG PO CAPS: ORAL_CAPSULE | ORAL | 3 refills | Status: AC

## 2021-11-29 NOTE — Progress Notes (Signed)
Cardiology Office Note:    Date:  12/02/2021   ID:  [name], DOB 1976-05-19, MRN [id number]  PCP:  Delsa Grana, PA-C   [facility] Providers Cardiologist:  Sherren Mocha, MD Electrophysiologist:  Virl Axe, MD     Referring MD: Delsa Grana, PA-C   Chief Complaint: follow-up    History of Present Illness:    [name] is a very pleasant 46 y.o. male with a hx of valvular heart disease and congestive heart failure. open aortic valve replacement, ventricular tachycardia, PAF, hypertension,   As a teenager was found to have a VSD on a sports physical.  He underwent surgical VSD repair at 46 years old ([facility]).  Was then found to have an aortic valve problem at age 52 and underwent bioprosthetic aortic valve replacement ([place]).  During that surgery there was an injury to the RCA and he had an infarct.  Was found to have episodes of VT in 2008 and was treated with medical therapy and an ICD.  Has had a few appropriate shocks for VT over the years and has had one ATP therapy delivered as well. Referral to [facility] for consideration of VT ablation, thought to be epicardial with mapping of the CS complicated by dissection, case aborted.  Has experienced progressive bioprosthetic aortic valve dysfunction with severe prosthetic valve stenosis and moderately severe LV systolic dysfunction.  Ultimately treated with third time redo median sternotomy and AVR using a 23 mm ON-X mechanical valve on 04/27/2021. Post-op course uncomplicated.   Seen in our office by Dr. Burt Knack on 06/06/21 at which time spironolactone was added and follow-up with Pharm D was recommended. Echo revealed normal aortic valve function, LVEF 30-35%, severe ateroseptal, inferoseptal and inferolateral HK and basal to mid inferior akinesis, mild LVH, G1 DD, mildly reduced RV function. Spironolactone was reduced to 12.5 mg. He was cleared by Dr. Cyndia Bent at office visit on 07/05/21 and was  enrolled in cardiac rehab. Device function normal at ov with Dr. Caryl Comes 07/11/21 and 1 yr f/u recommended.   His last office visit was with me on 09/06/21. He reported feeling well, energy level is good, doing well in cardiac rehab. Home BP and pulse readings are well-controlled.  Notes that heart rate generally is faster, and range from 80s to 90s which is higher than prior to surgery. He denied chest pain, shortness of breath, lower extremity edema, fatigue, palpitations, melena, hematuria, hemoptysis, diaphoresis, weakness, presyncope, syncope, orthopnea, and PND. Sternotomy incision appears to be in appropriate stages of healing, mild tenderness to touch. Has mild discomfort with weight lifting.  He has no specific cardiac complaints today. Empagliflozin was added to GDMT for cardiomyopathy. Follow-up bmet revealed stable serum creatinine.   Today,he is here alone for follow-up. Reports recent constipation, otherwise tolerating medications well. Exercises is able to do a little more than last time I saw him, hopes to continue to increase intensity. Completed cardiac rehab. Has noted dry mouth since starting Jardiance. He denies chest pain, shortness of breath, lower extremity edema, fatigue, palpitations, melena, hematuria, hemoptysis, diaphoresis, weakness, presyncope, syncope, orthopnea, and PND.  Past Medical History:  Diagnosis Date   AICD (automatic cardioverter/defibrillator) present    Anxiety    Aortic valve replaced    Requiring replacement, specifics not available   Complication of anesthesia    N & V   Dysrhythmia    Hearing loss of both ears 12/22/2015   Heart murmur    Hypertension  IBD (inflammatory bowel disease) 09/23/2015   ICD (implantable cardiac defibrillator), BSX single    Myocardial infarction [facility])    Obesity 12/22/2015   PONV (postoperative nausea and vomiting)    Secondary cardiomyopathy ([place])    Syncope    Ventricular septal defect    Ventricular tachycardia  ([place])    appropriate VT shock therapy /13    Past Surgical History:  Procedure Laterality Date   ANGIOPLASTY     RCA repair with vein angioplasty following injury with the aforementioned surgery   AORTIC VALVE REPLACEMENT     Bioprosthesis   AORTIC VALVE REPLACEMENT N/A 04/27/2021   Procedure: REDO AORTIC VALVE REPLACEMENT (AVR) USING ON-X 23MM AORTIC VALVE;  Surgeon: Gaye Pollack, MD;  Location: [place];  Service: Open Heart Surgery;  Laterality: N/A;   CARDIAC CATHETERIZATION     CARDIAC DEFIBRILLATOR PLACEMENT     CARDIAC VALVE REPLACEMENT     CHOLECYSTECTOMY     COLONOSCOPY WITH PROPOFOL N/A 12/06/2015   Procedure: COLONOSCOPY WITH PROPOFOL;  Surgeon: [name], MD;  Location: [facility] ENDOSCOPY;  Service: Endoscopy;  Laterality: N/A;   CORONARY ANGIOPLASTY     CORONARY ARTERY BYPASS GRAFT     IMPLANTABLE CARDIOVERTER DEFIBRILLATOR GENERATOR CHANGE N/A 05/05/2013   Procedure: IMPLANTABLE CARDIOVERTER DEFIBRILLATOR GENERATOR CHANGE;  Surgeon: Deboraha Sprang, MD;  Location: [facility] CATH LAB;  Service: Cardiovascular;  Laterality: N/A;   INSERT / REPLACE / REMOVE PACEMAKER     RIGHT/LEFT HEART CATH AND CORONARY ANGIOGRAPHY N/A 03/01/2021   Procedure: RIGHT/LEFT HEART CATH AND CORONARY ANGIOGRAPHY;  Surgeon: Sherren Mocha, MD;  Location: [place] CV LAB;  Service: Cardiovascular;  Laterality: N/A;   TEE WITHOUT CARDIOVERSION N/A 04/27/2021   Procedure: TRANSESOPHAGEAL ECHOCARDIOGRAM (TEE);  Surgeon: Gaye Pollack, MD;  Location: [place];  Service: Open Heart Surgery;  Laterality: N/A;   VSD REPAIR      Current Medications: Current Meds  Medication Sig   acetaminophen (TYLENOL) 500 MG tablet Take 500 mg by mouth every 6 (six) hours as needed for moderate pain.   amoxicillin (AMOXIL) 500 MG capsule Take 4 capsules by mouth one hour prior to dental exam   aspirin EC 81 MG EC tablet Take 1 tablet (81 mg total) by mouth daily. Swallow whole.   carvedilol (COREG) 6.25 MG tablet Take 1  tablet (6.25 mg total) by mouth 2 (two) times daily.   empagliflozin (JARDIANCE) 10 MG TABS tablet Take 1 tablet (10 mg total) by mouth daily before breakfast.   fluticasone (FLONASE) 50 MCG/ACT nasal spray Place 2 sprays into both nostrils daily as needed for allergies.   loratadine (CLARITIN) 10 MG tablet Take by mouth as needed.   magnesium oxide (MAG-OX) 400 MG tablet Take 1 tablet (400 mg total) by mouth 2 (two) times daily.   meclizine (ANTIVERT) 25 MG tablet Take 0.5-1 tablets (12.5-25 mg total) by mouth 3 (three) times daily as needed for dizziness.   Multiple Vitamin (MULTIVITAMIN) tablet Take 2 tablets by mouth daily.   ondansetron (ZOFRAN-ODT) 4 MG disintegrating tablet Take by mouth as needed.   potassium chloride SA (KLOR-CON M) 20 MEQ tablet Take 1 tablet (20 mEq total) by mouth daily. For 4 days then stop.   ranolazine (RANEXA) 1000 MG SR tablet Take 1 tablet (1,000 mg total) by mouth 2 (two) times daily.   sacubitril-valsartan (ENTRESTO) 24-26 MG Take 1 tablet by mouth 2 (two) times daily.   spironolactone (ALDACTONE) 25 MG tablet Take 0.5 tablets (12.5 mg total)  by mouth daily.   triamcinolone cream (KENALOG) 0.1 % Apply 1 application topically 2 (two) times daily as needed.   umeclidinium bromide (INCRUSE ELLIPTA) 62.5 MCG/ACT AEPB Inhale 1 puff into the lungs as needed.   warfarin (COUMADIN) 5 MG tablet TAKE 1 AND 1/2 TABLETS BY MOUTH DAILY OR AS DIRECTED BY THE COUMADIN CLINIC   [DISCONTINUED] fluticasone (FLONASE) 50 MCG/ACT nasal spray Place into the nose.   [DISCONTINUED] triamcinolone cream (KENALOG) 0.1 % Apply topically as needed.     Allergies:   Shellfish allergy   Social History   Socioeconomic History   Marital status: Married    Spouse name: Anderson Malta   Number of children: 2   Years of education: 17   Highest education level: Bachelor's degree (e.g., BA, AB, BS)  Occupational History   Occupation: Full time     Employer: [employer]    Comment: In IT dept at [employer]  Tobacco Use   Smoking status: Never   Smokeless tobacco: Never  Vaping Use   Vaping Use: Never used  Substance and Sexual Activity   Alcohol use: No   Drug use: No   Sexual activity: Yes    Partners: Female  Other Topics Concern   Not on file  Social History Narrative   Married   Social Determinants of Health   Financial Resource Strain: Low Risk  (02/17/2020)   Overall Financial Resource Strain (CARDIA)    Difficulty of Paying Living Expenses: Not hard at all  Food Insecurity: No Food Insecurity (02/17/2020)   Hunger Vital Sign    Worried About Running Out of Food in the Last Year: Never true    Ran Out of Food in the Last Year: Never true  Transportation Needs: No Transportation Needs (02/17/2020)   PRAPARE - Hydrologist (Medical): No    Lack of Transportation (Non-Medical): No  Physical Activity: Inactive (02/17/2020)   Exercise Vital Sign    Days of Exercise per Week: 0 days    Minutes of Exercise per Session: 0 min  Stress: No Stress Concern Present (02/17/2020)   [place]    Feeling of Stress : Not at all  Social Connections: Moderately Integrated (02/17/2020)   Social Connection and Isolation Panel [NHANES]    Frequency of Communication with Friends and Family: Twice a week    Frequency of Social Gatherings with Friends and Family: Twice a week    Attends Religious Services: 1 to 4 times per year    Active Member of [employer] or Organizations: No    Attends Music therapist: Never    Marital Status: Married     Family History: The patient's family history includes Cancer in his maternal grandfather and maternal grandmother; Heart disease in his father; Hypertension in his maternal grandfather and maternal grandmother.  ROS:   Please see the history of present illness. All other systems reviewed and are negative.  Labs/Other Studies Reviewed:    The  following studies were reviewed today:  Echo 06/07/21  1. Left ventricular ejection fraction, by estimation, is 30 to 35%. The  left ventricle has moderately decreased function. The left ventricle  demonstrates regional wall motion abnormalities with severe anteroseptal,  inferoseptal, and inferolateral  hypokinesis and basal to mid inferior akinesis. The left ventricular  internal cavity size was moderately dilated. There is mild left  ventricular hypertrophy. Left ventricular diastolic parameters are  consistent with Grade I diastolic  dysfunction  (impaired relaxation).   2. Right ventricular systolic function is mildly reduced. The right  ventricular size is normal. Tricuspid regurgitation signal is inadequate  for assessing PA pressure.   3. The mitral valve is normal in structure. No evidence of mitral valve  regurgitation. No evidence of mitral stenosis.   4. Mechanical AVR (23 mm On-X valve). Mean gradient 8 mmHg, no  significant stenosis. Trivial aortic insufficiency.   5. Left atrial size was mildly dilated.   6. The inferior vena cava is normal in size with greater than 50%  respiratory variability, suggesting right atrial pressure of 3 mmHg.   CCTA 03/19/21  1. 25 mm pericardial bioprosthesis (likely a Perimount prosthesis) in the aortic position with calcified and thickened leaflets consistent with structural valve deterioration.   2. Valve in valve analysis appropriate for 26 mm S3 TAVR. No risk of coronary obstruction.   3. Optimal Fluoroscopic Angle for Delivery: LAO 5 CRA 2   4. Severely dilated left ventricular cavity. Moderately reduced LV function, EF=32%. Regional wall motion abnormalities are consistent with prior RCA infarction.     Ost RCA to Prox RCA lesion is 100% stenosed.   There is severe aortic valve stenosis.  R/LHC 03/01/21   1.  Single-vessel coronary artery disease with total occlusion of the RCA, collateralized from the left coronary  artery (this is known from the patient's history) 2.  Patent left main, LAD, and left circumflex with no obstructive coronary artery disease in those vessels. The RCA is collateralized from septal perforators of the LAD 3.  Severe bioprosthetic aortic valve stenosis with mean gradient 57 mmHg, peak to peak gradient 74 mmHg, calculated aortic valve area 0.7 cm 4.  Normal right heart hemodynamics and preserved cardiac output   Recommendations: Cardiac surgical referral for consideration of redo aortic valve replacement   Recent Labs: 04/28/2021: Magnesium 2.4 07/11/2021: ALT 26; Hemoglobin 12.9; Platelets 177 09/22/2021: BUN 16; Creatinine, Ser 1.19; Potassium 4.3; Sodium 139  Recent Lipid Panel    Component Value Date/Time   CHOL 174 07/11/2021 1438   CHOL 156 07/12/2020 1135   TRIG 174 (H) 07/11/2021 1438   HDL 45 07/11/2021 1438   HDL 38 (L) 07/12/2020 1135   CHOLHDL 3.9 07/11/2021 1438   LDLCALC 102 (H) 07/11/2021 1438     Risk Assessment/Calculations:       Physical Exam:    VS:  BP 118/80   Pulse 73   Ht '5\' 10"'$  (1.778 m)   Wt 198 lb 3.2 oz (89.9 kg)   SpO2 97%   BMI 28.44 kg/m     Wt Readings from Last 3 Encounters:  12/01/21 198 lb 3.2 oz (89.9 kg)  09/27/21 198 lb 6.4 oz (90 kg)  09/06/21 197 lb 9.6 oz (89.6 kg)     GEN:  Well nourished, well developed in no acute distress HEENT: Normal NECK: No JVD; No carotid bruits CARDIAC: RRR, Click of valve replacement, no rubs, gallops RESPIRATORY:  Clear to auscultation without rales, wheezing or rhonchi  ABDOMEN: Soft, non-tender, non-distended MUSCULOSKELETAL:  No edema; No deformity. 2+ pedal pulses, equal bilaterally SKIN: Warm and dry NEUROLOGIC:  Alert and oriented x 3 PSYCHIATRIC:  Normal affect   EKG:  EKG is not ordered today  Diagnoses:    1. Ischemic cardiomyopathy   2. H/O aortic valve replacement   3. Severe aortic stenosis   4. Chronic systolic heart failure ([place])   5. Coronary artery disease  involving native coronary artery of  native heart without angina pectoris   6. Ventricular tachycardia ([place])   7. Essential hypertension   8. Constipation, unspecified constipation type     Assessment and Plan:     Severe aortic stenosis s/p AVR on anticoagulation: Congenital valve defect, initial valve replacement 1998. Redo replacement on 04/27/2021 by Dr. Cyndia Bent with mechanical valve. Stable valve function on echo 06/07/21. No concerns since returning to work. He continues to gradually increase exercise. He completed cardiac rehab. No bleeding problems on Coumadin and aspirin.   Chronic HFrEF/Ischemic cardiomyopathy: LVEF 35-40%, indeterminate diastolic parameters by echo 06/07/21. Denies dyspnea, orthopnea, PND, edema. Appears euvolemic on exam today. Continue GDMT including epagliflozin, spironomactone, Entresto, and carvedilol. No concerns with medications. Will update bmet today.  Ventricular tachycardia s/p AICD implant: Last device interrogation 11/09/21, normal device function. No shocks, no concerns from patient. Management per EP.   CAD without angina: Remote inferior infarct 2/2 surgical intervention with injury to [place]. Cardiac catheterization 02/2021 revealed patency of left main, LAD, and left circumflex.He is gradually increasing exercise s/p AVR. Denies chest pain, dyspnea, or other symptoms concerning for angina. No indication for further ischemic evaluation at this time.   Essential hypertension: BP well-controlled today. He denies concerns of hypotension or elevated BP readings. Feeling well on current medications. No changes today.   Constipation: Reports onset since last office visit. Encouraged him to try Miralax and ensure good hydration with at least 48 oz of water daily.    Disposition: 6 months with Dr. Burt Knack  Medication Adjustments/Labs and Tests Ordered: Current medicines are reviewed at length with the patient today.  Concerns regarding medicines are outlined  above.  Orders Placed This Encounter  Procedures   Basic Metabolic Panel (BMET)   No orders of the defined types were placed in this encounter.   Patient Instructions  Medication Instructions:   Your physician recommends that you continue on your current medications as directed. Please refer to the Current Medication list given to you today.  Try Miralax for constipation.    *If you need a refill on your cardiac medications before your next appointment, please call your pharmacy*   Lab Work:  TODAY!!!!! BMET  If you have labs (blood work) drawn today and your tests are completely normal, you will receive your results only by: [place] (if you have MyChart) OR A paper copy in the mail If you have any lab test that is abnormal or we need to change your treatment, we will call you to review the results.   Testing/Procedures:  None ordered.   Follow-Up: At [facility], you and your health needs are our priority.  As part of our continuing mission to provide you with exceptional heart care, we have created designated Provider Care Teams.  These Care Teams include your primary Cardiologist (physician) and Advanced Practice Providers (APPs -  Physician Assistants and Nurse Practitioners) who all work together to provide you with the care you need, when you need it.  We recommend signing up for the patient portal called "MyChart".  Sign up information is provided on this After Visit Summary.  MyChart is used to connect with patients for Virtual Visits (Telemedicine).  Patients are able to view lab/test results, encounter notes, upcoming appointments, etc.  Non-urgent messages can be sent to your provider as well.   To learn more about what you can do with MyChart, go to [email or web address].    Your next appointment:   6 month(s)  The format for your next  appointment:   In Person  Provider:   Sherren Mocha, MD     Other Instructions  Your physician wants you  to follow-up in: 6 month with Dr.Cooper.  You will receive a reminder letter in the mail two months in advance. If you don't receive a letter, please call our office to schedule the follow-up appointment.   Important Information About Sugar         Signed, Emmaline Life, NP  12/02/2021 12:02 AM    [place]

## 2021-12-01 ENCOUNTER — Ambulatory Visit: Payer: 59 | Admitting: Nurse Practitioner

## 2021-12-01 ENCOUNTER — Encounter: Payer: Self-pay | Admitting: Nurse Practitioner

## 2021-12-01 VITALS — BP 118/80 | HR 73 | Ht 70.0 in | Wt 198.2 lb

## 2021-12-01 DIAGNOSIS — I255 Ischemic cardiomyopathy: Secondary | ICD-10-CM | POA: Diagnosis not present

## 2021-12-01 DIAGNOSIS — I5022 Chronic systolic (congestive) heart failure: Secondary | ICD-10-CM | POA: Diagnosis not present

## 2021-12-01 DIAGNOSIS — I350 Nonrheumatic aortic (valve) stenosis: Secondary | ICD-10-CM

## 2021-12-01 DIAGNOSIS — I472 Ventricular tachycardia, unspecified: Secondary | ICD-10-CM

## 2021-12-01 DIAGNOSIS — Z952 Presence of prosthetic heart valve: Secondary | ICD-10-CM

## 2021-12-01 DIAGNOSIS — K5900 Constipation, unspecified: Secondary | ICD-10-CM

## 2021-12-01 DIAGNOSIS — I2510 Atherosclerotic heart disease of native coronary artery without angina pectoris: Secondary | ICD-10-CM

## 2021-12-01 DIAGNOSIS — I10 Essential (primary) hypertension: Secondary | ICD-10-CM

## 2021-12-01 NOTE — Patient Instructions (Signed)
Medication Instructions:   Your physician recommends that you continue on your current medications as directed. Please refer to the Current Medication list given to you today.  Try Miralax for constipation.    *If you need a refill on your cardiac medications before your next appointment, please call your pharmacy*   Lab Work:  TODAY!!!!! BMET  If you have labs (blood work) drawn today and your tests are completely normal, you will receive your results only by: [place] (if you have MyChart) OR A paper copy in the mail If you have any lab test that is abnormal or we need to change your treatment, we will call you to review the results.   Testing/Procedures:  None ordered.   Follow-Up: At [facility], you and your health needs are our priority.  As part of our continuing mission to provide you with exceptional heart care, we have created designated Provider Care Teams.  These Care Teams include your primary Cardiologist (physician) and Advanced Practice Providers (APPs -  Physician Assistants and Nurse Practitioners) who all work together to provide you with the care you need, when you need it.  We recommend signing up for the patient portal called "MyChart".  Sign up information is provided on this After Visit Summary.  MyChart is used to connect with patients for Virtual Visits (Telemedicine).  Patients are able to view lab/test results, encounter notes, upcoming appointments, etc.  Non-urgent messages can be sent to your provider as well.   To learn more about what you can do with MyChart, go to [email or web address].    Your next appointment:   6 month(s)  The format for your next appointment:   In Person  Provider:   Sherren Mocha, MD     Other Instructions  Your physician wants you to follow-up in: 6 month with Dr.Cooper.  You will receive a reminder letter in the mail two months in advance. If you don't receive a letter, please call our office to schedule  the follow-up appointment.   Important Information About Sugar

## 2021-12-02 ENCOUNTER — Encounter: Payer: Self-pay | Admitting: Nurse Practitioner

## 2021-12-02 LAB — BASIC METABOLIC PANEL
BUN/Creatinine Ratio: 15 (ref 9–20)
BUN: 20 mg/dL (ref 6–24)
CO2: 22 mmol/L (ref 20–29)
Calcium: 9.6 mg/dL (ref 8.7–10.2)
Chloride: 103 mmol/L (ref 96–106)
Creatinine, Ser: 1.37 mg/dL — ABNORMAL HIGH (ref 0.76–1.27)
Glucose: 80 mg/dL (ref 70–99)
Potassium: 4.4 mmol/L (ref 3.5–5.2)
Sodium: 140 mmol/L (ref 134–144)
eGFR: 64 mL/min/{1.73_m2} (ref >59)

## 2021-12-13 ENCOUNTER — Telehealth: Payer: Self-pay | Admitting: Cardiovascular Disease

## 2021-12-13 ENCOUNTER — Other Ambulatory Visit: Payer: Self-pay | Admitting: Physician Assistant

## 2021-12-13 NOTE — Telephone Encounter (Signed)
Pt would like for nurse to callback regarding insurance forms. Please advise

## 2021-12-13 NOTE — Telephone Encounter (Signed)
Reached out to patient. Explained that we ask for 2-4 weeks for insurance forms (and collect a fee for processing) and that it has only been 1 week since his original request for the forms to be complete. Dr Burt Knack will be back in office this upcoming Monday and Wednesday and I will do by absolute best to get these done then.

## 2021-12-20 ENCOUNTER — Ambulatory Visit: Payer: 59 | Admitting: Cardiovascular Disease

## 2021-12-27 ENCOUNTER — Ambulatory Visit (INDEPENDENT_AMBULATORY_CARE_PROVIDER_SITE_OTHER): Payer: 59

## 2021-12-27 DIAGNOSIS — Z7901 Long term (current) use of anticoagulants: Secondary | ICD-10-CM

## 2021-12-27 DIAGNOSIS — Z5181 Encounter for therapeutic drug level monitoring: Secondary | ICD-10-CM | POA: Diagnosis not present

## 2021-12-27 DIAGNOSIS — Z952 Presence of prosthetic heart valve: Secondary | ICD-10-CM | POA: Diagnosis not present

## 2021-12-27 DIAGNOSIS — I350 Nonrheumatic aortic (valve) stenosis: Secondary | ICD-10-CM

## 2021-12-27 LAB — POCT INR: INR: 2 (ref 2.0–3.0)

## 2021-12-27 NOTE — Patient Instructions (Signed)
Continue taking warfarin 1.5 tablets daily. Recheck INR in 6 weeks. Per Dr Bartle's note 07/05/21 pt has ON-X valve placed 04/27/21, after 3 months can decrease INR goal 1.5-2.0 with 81mg ASA.  Be consistent with green (2 times per week)  [facility] [phone] 

## 2022-02-06 ENCOUNTER — Other Ambulatory Visit: Payer: Self-pay

## 2022-02-06 MED ORDER — CARVEDILOL 6.25 MG PO TABS: 6.2500 mg | ORAL_TABLET | Freq: Two times a day (BID) | ORAL | 3 refills | Status: DC

## 2022-02-07 ENCOUNTER — Ambulatory Visit: Payer: 59 | Attending: Cardiology

## 2022-02-08 ENCOUNTER — Ambulatory Visit (INDEPENDENT_AMBULATORY_CARE_PROVIDER_SITE_OTHER): Payer: 59

## 2022-02-08 DIAGNOSIS — I255 Ischemic cardiomyopathy: Secondary | ICD-10-CM

## 2022-02-09 LAB — CUP PACEART REMOTE DEVICE CHECK
Battery Remaining Longevity: 54 mo
Battery Remaining Percentage: 61 %
Brady Statistic RV Percent Paced: 0 %
Date Time Interrogation Session: 20230921210000
HighPow Impedance: 55 Ohm
Implantable Lead Implant Date: 20080214
Implantable Lead Location: 753860
Implantable Lead Model: 185
Implantable Lead Serial Number: 178017
Implantable Pulse Generator Implant Date: 20141216
Lead Channel Impedance Value: 634 Ohm
Lead Channel Pacing Threshold Amplitude: 1.1 V
Lead Channel Pacing Threshold Pulse Width: 0.4 ms
Lead Channel Setting Pacing Amplitude: 2 V
Lead Channel Setting Pacing Pulse Width: 0.4 ms
Lead Channel Setting Sensing Sensitivity: 0.6 mV
Pulse Gen Serial Number: 126781

## 2022-02-13 ENCOUNTER — Ambulatory Visit: Payer: 59 | Attending: Cardiology

## 2022-02-13 DIAGNOSIS — Z7901 Long term (current) use of anticoagulants: Secondary | ICD-10-CM

## 2022-02-13 DIAGNOSIS — I350 Nonrheumatic aortic (valve) stenosis: Secondary | ICD-10-CM

## 2022-02-13 DIAGNOSIS — Z952 Presence of prosthetic heart valve: Secondary | ICD-10-CM

## 2022-02-13 DIAGNOSIS — Z5181 Encounter for therapeutic drug level monitoring: Secondary | ICD-10-CM

## 2022-02-13 LAB — POCT INR: INR: 2.4 (ref 2.0–3.0)

## 2022-02-13 NOTE — Patient Instructions (Signed)
TAKE 1 TABLET ONLY TODAY and Continue taking warfarin 1.5 tablets daily. Recheck INR in 4 weeks. Per Dr Vivi Martens note 07/05/21 pt has ON-X valve placed 04/27/21, after 3 months can decrease INR goal 1.5-2.0 with '81mg'$  ASA.  Be consistent with green (2 times per week)  [facility] [phone]

## 2022-02-19 NOTE — Progress Notes (Signed)
Remote ICD transmission.   

## 2022-03-07 ENCOUNTER — Other Ambulatory Visit: Payer: Self-pay | Admitting: Cardiovascular Disease

## 2022-03-07 NOTE — Telephone Encounter (Signed)
Refill request for warfarin:  Last INR was 2.4 on 02/13/22 Next INR due 03/14/22 LOV was 12/01/21  Refill approved.

## 2022-03-14 ENCOUNTER — Ambulatory Visit: Payer: 59 | Attending: Cardiovascular Disease

## 2022-03-14 DIAGNOSIS — Z5181 Encounter for therapeutic drug level monitoring: Secondary | ICD-10-CM

## 2022-03-14 DIAGNOSIS — I350 Nonrheumatic aortic (valve) stenosis: Secondary | ICD-10-CM | POA: Diagnosis not present

## 2022-03-14 DIAGNOSIS — Z7901 Long term (current) use of anticoagulants: Secondary | ICD-10-CM

## 2022-03-14 DIAGNOSIS — Z952 Presence of prosthetic heart valve: Secondary | ICD-10-CM | POA: Diagnosis not present

## 2022-03-14 LAB — POCT INR: INR: 2.3 (ref 2.0–3.0)

## 2022-03-14 NOTE — Patient Instructions (Signed)
Continue taking warfarin 1.5 tablets daily. Recheck INR in 4 weeks. Per Dr [name] note 07/05/21 pt has ON-X valve placed 04/27/21, after 3 months can decrease INR goal 1.5-2.0 with '81mg'$  ASA.  Be consistent with green (2 times per week)  [facility] [phone]

## 2022-04-09 ENCOUNTER — Telehealth: Payer: Self-pay | Admitting: Cardiovascular Disease

## 2022-04-09 ENCOUNTER — Encounter: Payer: Self-pay | Admitting: Cardiovascular Disease

## 2022-04-09 NOTE — Telephone Encounter (Signed)
Patient calling trying to file a claim and they are requesting notes of a procedure. He wants to know if they can be provided.

## 2022-04-09 NOTE — Telephone Encounter (Signed)
Patient returned call to office and states that the disability paperwork we filled out for him was sent in and the company is now requesting the surgical/operative note for the aortic valve surgery he had in December with Dr Cyndia Bent. Advised that I can print them out for him to pick up, but patient denies and says that insurance will be sending [place] a request form for this, as they state that the information has tob come directly from [place]. Will await insurance request for documentation.

## 2022-04-09 NOTE — Telephone Encounter (Signed)
Returned call to patient, no answer, left message for him to call back for more details.

## 2022-04-11 ENCOUNTER — Ambulatory Visit: Payer: 59 | Attending: Cardiovascular Disease

## 2022-04-11 DIAGNOSIS — Z952 Presence of prosthetic heart valve: Secondary | ICD-10-CM | POA: Diagnosis not present

## 2022-04-11 DIAGNOSIS — Z7901 Long term (current) use of anticoagulants: Secondary | ICD-10-CM | POA: Diagnosis not present

## 2022-04-11 DIAGNOSIS — I350 Nonrheumatic aortic (valve) stenosis: Secondary | ICD-10-CM

## 2022-04-11 LAB — POCT INR: INR: 1.8 — AB (ref 2.0–3.0)

## 2022-04-11 NOTE — Patient Instructions (Signed)
Continue taking warfarin 1.5 tablets daily. Recheck INR in 6 weeks. Per Dr Vivi Martens note 07/05/21 pt has ON-X valve placed 04/27/21, after 3 months can decrease INR goal 1.5-2.0 with '81mg'$  ASA.  Be consistent with green (2 times per week)  [facility] [phone]

## 2022-04-16 ENCOUNTER — Other Ambulatory Visit: Payer: Self-pay | Admitting: Cardiovascular Disease

## 2022-05-07 ENCOUNTER — Encounter: Payer: Self-pay | Admitting: Family Medicine

## 2022-05-07 ENCOUNTER — Ambulatory Visit (INDEPENDENT_AMBULATORY_CARE_PROVIDER_SITE_OTHER): Payer: 59 | Admitting: Family Medicine

## 2022-05-07 VITALS — BP 104/70 | HR 85 | Temp 98.0°F | Resp 16 | Ht 70.0 in | Wt 214.5 lb

## 2022-05-07 DIAGNOSIS — I429 Cardiomyopathy, unspecified: Secondary | ICD-10-CM | POA: Diagnosis not present

## 2022-05-07 DIAGNOSIS — R350 Frequency of micturition: Secondary | ICD-10-CM | POA: Diagnosis not present

## 2022-05-07 DIAGNOSIS — Z95810 Presence of automatic (implantable) cardiac defibrillator: Secondary | ICD-10-CM

## 2022-05-07 DIAGNOSIS — M545 Low back pain, unspecified: Secondary | ICD-10-CM

## 2022-05-07 DIAGNOSIS — Z5181 Encounter for therapeutic drug level monitoring: Secondary | ICD-10-CM

## 2022-05-07 DIAGNOSIS — Z7901 Long term (current) use of anticoagulants: Secondary | ICD-10-CM | POA: Diagnosis not present

## 2022-05-07 DIAGNOSIS — Z952 Presence of prosthetic heart valve: Secondary | ICD-10-CM

## 2022-05-07 DIAGNOSIS — E785 Hyperlipidemia, unspecified: Secondary | ICD-10-CM

## 2022-05-07 NOTE — Assessment & Plan Note (Signed)
Doing well, per cardiology, most recent cardiology office visit reviewed today with the patient

## 2022-05-07 NOTE — Assessment & Plan Note (Signed)
History of low back pain that the patient does not recall when he might of had this before he endorses low back pain without radiation he is concerned about his kidneys

## 2022-05-07 NOTE — Progress Notes (Signed)
Name: [name]   MRN: [id number]    DOB: June 08, 1975   Date:05/07/2022       Progress Note  Chief Complaint  Patient presents with   Follow-up   Hypertension     Subjective:   [name] is a 46 y.o. male, presents to clinic for routine f/up  He anticpated doing his physical today but it was done Feb 2023  Jardiance for heart protection  - his cardiologist wanted his kidney function rechecked and monitored We reviewed his labs  Urinary frequency all day and all night, different types of drinks make it worse last night up multiple times No burning Some low back pain No hematuria On jardiance entresto, spironolactone  Reports family hx of prostate CA - a grandfather Lab Results  Component Value Date   PSA1 1.2 12/25/2016   PSA1 1.1 12/22/2015   PSA1 1.1 05/11/2015   PSA 1.25 07/11/2021   PSA 0.73 02/17/2020   PSA 0.9 02/16/2019    Not on cholesterol meds, cardiology wanted to start him - he did not start meds  Lab Results  Component Value Date   CHOL 174 07/11/2021   HDL 45 07/11/2021   LDLCALC 102 (H) 07/11/2021   TRIG 174 (H) 07/11/2021   CHOLHDL 3.9 07/11/2021   Heart/BP meds the same as below - denies palpitations, chest pain, near syncope, exertional symptoms or fatigue he is currently on carvedilol 6.25 mg twice daily, Entresto, and spironolactone, Ranexa Jardiance and warfarin he also has prophylactic antibiotics for dental procedures  He denies any bleeding concerns he does monitor his PT/INR with cardiology and Coumadin clinic    Current Outpatient Medications:    acetaminophen (TYLENOL) 500 MG tablet, Take 500 mg by mouth every 6 (six) hours as needed for moderate pain., Disp: , Rfl:    amoxicillin (AMOXIL) 500 MG capsule, Take 4 capsules by mouth one hour prior to dental exam, Disp: 12 capsule, Rfl: 3   aspirin EC 81 MG EC tablet, Take 1 tablet (81 mg total) by mouth daily. Swallow whole., Disp: 30 tablet, Rfl: 11   carvedilol (COREG) 6.25  MG tablet, Take 1 tablet (6.25 mg total) by mouth 2 (two) times daily., Disp: 180 tablet, Rfl: 3   empagliflozin (JARDIANCE) 10 MG TABS tablet, Take 1 tablet (10 mg total) by mouth daily before breakfast., Disp: 30 tablet, Rfl: 11   fluticasone (FLONASE) 50 MCG/ACT nasal spray, Place 2 sprays into both nostrils daily as needed for allergies., Disp: , Rfl:    loratadine (CLARITIN) 10 MG tablet, Take by mouth as needed., Disp: , Rfl:    magnesium oxide (MAG-OX) 400 MG tablet, Take 1 tablet (400 mg total) by mouth 2 (two) times daily., Disp: 180 tablet, Rfl: 3   meclizine (ANTIVERT) 25 MG tablet, Take 0.5-1 tablets (12.5-25 mg total) by mouth 3 (three) times daily as needed for dizziness., Disp: 30 tablet, Rfl: 2   Multiple Vitamin (MULTIVITAMIN) tablet, Take 2 tablets by mouth daily., Disp: , Rfl:    ondansetron (ZOFRAN-ODT) 4 MG disintegrating tablet, Take by mouth as needed., Disp: , Rfl:    oxyCODONE (OXY IR/ROXICODONE) 5 MG immediate release tablet, Take 1 tablet (5 mg total) by mouth every 4 (four) hours as needed for severe pain., Disp: 30 tablet, Rfl: 0   potassium chloride SA (KLOR-CON M) 20 MEQ tablet, Take 1 tablet (20 mEq total) by mouth daily. For 4 days then stop., Disp: 4 tablet, Rfl: 0   ranolazine (RANEXA) 1000  MG SR tablet, Take 1 tablet (1,000 mg total) by mouth 2 (two) times daily., Disp: 180 tablet, Rfl: 3   sacubitril-valsartan (ENTRESTO) 24-26 MG, Take 1 tablet by mouth 2 (two) times daily., Disp: 180 tablet, Rfl: 3   spironolactone (ALDACTONE) 25 MG tablet, Take 0.5 tablets (12.5 mg total) by mouth daily., Disp: 45 tablet, Rfl: 3   triamcinolone cream (KENALOG) 0.1 %, Apply 1 application topically 2 (two) times daily as needed., Disp: 30 g, Rfl: 0   umeclidinium bromide (INCRUSE ELLIPTA) 62.5 MCG/ACT AEPB, Inhale 1 puff into the lungs as needed., Disp: , Rfl:    warfarin (COUMADIN) 5 MG tablet, TAKE ONE AND ONE-HALF TABLETS DAILY OR AS DIRECTED BY THE COUMADIN CLINIC, Disp: 150  tablet, Rfl: 3  Patient Active Problem List   Diagnosis Date Noted   Long term (current) use of anticoagulants 05/05/2021   Disorder of prosthetic aortic valve 04/27/2021   Severe aortic stenosis 03/01/2021   Epigastric pain 05/09/2017   Lipoma of buttock 01/11/2017   ED (erectile dysfunction) 12/01/2016   Syncope 05/16/2016   Sustained ventricular tachycardia ([place]) 05/16/2016   Ventricular tachycardia ([place]) 05/16/2016   Prostate cancer screening 12/22/2015   Hearing loss of both ears 12/22/2015   Abnormal findings-gastrointestinal tract    IBD (inflammatory bowel disease) 09/23/2015   Pain in lower back 09/15/2015   Night sweats 09/15/2015   Groin pain 07/29/2015   Screening for STD (sexually transmitted disease) 07/29/2015   Preventative health care 05/10/2015   Presence of cardiac defibrillator 05/10/2015   Hypertension goal BP (blood pressure) < 140/90 12/13/2011   H/O aortic valve replacement 08/08/2009   Cardiomyopathy, secondary ([place]) 03/09/2009   History of ventricular tachycardia 03/09/2009    Past Surgical History:  Procedure Laterality Date   ANGIOPLASTY     RCA repair with vein angioplasty following injury with the aforementioned surgery   AORTIC VALVE REPLACEMENT     Bioprosthesis   AORTIC VALVE REPLACEMENT N/A 04/27/2021   Procedure: REDO AORTIC VALVE REPLACEMENT (AVR) USING ON-X 23MM AORTIC VALVE;  Surgeon: Gaye Pollack, MD;  Location: [facility];  Service: Open Heart Surgery;  Laterality: N/A;   CARDIAC CATHETERIZATION     CARDIAC DEFIBRILLATOR PLACEMENT     CARDIAC VALVE REPLACEMENT     CHOLECYSTECTOMY     COLONOSCOPY WITH PROPOFOL N/A 12/06/2015   Procedure: COLONOSCOPY WITH PROPOFOL;  Surgeon: [name], MD;  Location: [facility] ENDOSCOPY;  Service: Endoscopy;  Laterality: N/A;   CORONARY ANGIOPLASTY     CORONARY ARTERY BYPASS GRAFT     IMPLANTABLE CARDIOVERTER DEFIBRILLATOR GENERATOR CHANGE N/A 05/05/2013   Procedure: IMPLANTABLE CARDIOVERTER DEFIBRILLATOR  GENERATOR CHANGE;  Surgeon: Deboraha Sprang, MD;  Location: [facility] CATH LAB;  Service: Cardiovascular;  Laterality: N/A;   INSERT / REPLACE / REMOVE PACEMAKER     RIGHT/LEFT HEART CATH AND CORONARY ANGIOGRAPHY N/A 03/01/2021   Procedure: RIGHT/LEFT HEART CATH AND CORONARY ANGIOGRAPHY;  Surgeon: Sherren Mocha, MD;  Location: [facility] CV LAB;  Service: Cardiovascular;  Laterality: N/A;   TEE WITHOUT CARDIOVERSION N/A 04/27/2021   Procedure: TRANSESOPHAGEAL ECHOCARDIOGRAM (TEE);  Surgeon: Gaye Pollack, MD;  Location: [place];  Service: Open Heart Surgery;  Laterality: N/A;   VSD REPAIR      Family History  Problem Relation Age of Onset   Heart disease Father    Hypertension Maternal Grandmother    Cancer Maternal Grandmother    Hypertension Maternal Grandfather    Cancer Maternal Grandfather     Social History  Tobacco Use   Smoking status: Never   Smokeless tobacco: Never  Vaping Use   Vaping Use: Never used  Substance Use Topics   Alcohol use: No   Drug use: No     Allergies  Allergen Reactions   Shellfish Allergy Nausea And Vomiting    Health Maintenance  Topic Date Due   COVID-19 Vaccine (3 - Pfizer risk series) 11/08/2019   INFLUENZA VACCINE  12/19/2021   DTaP/Tdap/Td (2 - Td or Tdap) 05/09/2025   COLONOSCOPY (Pts 45-43yr Insurance coverage will need to be confirmed)  12/05/2025   Hepatitis C Screening  Completed   HIV Screening  Completed   HPV VACCINES  Aged Out    Chart Review Today: I personally reviewed active problem list, medication list, allergies, family history, social history, health maintenance, notes from last encounter, lab results, imaging with the patient/caregiver today.   Review of Systems  Constitutional: Negative.   HENT: Negative.    Eyes: Negative.   Respiratory: Negative.    Cardiovascular: Negative.   Gastrointestinal: Negative.   Endocrine: Negative.   Genitourinary: Negative.   Musculoskeletal: Negative.   Skin: Negative.    Allergic/Immunologic: Negative.   Neurological: Negative.   Hematological: Negative.   Psychiatric/Behavioral: Negative.    All other systems reviewed and are negative.    Objective:   Vitals:   05/07/22 1145  BP: 104/70  Pulse: 85  Resp: 16  Temp: 98 F (36.7 C)  TempSrc: Oral  SpO2: 94%  Weight: 214 lb 8 oz (97.3 kg)  Height: '5\' 10"'$  (1.778 m)    Body mass index is 30.78 kg/m.  Physical Exam Vitals and nursing note reviewed.  Constitutional:      General: He is not in acute distress.    Appearance: Normal appearance. He is well-developed and overweight. He is not ill-appearing, toxic-appearing or diaphoretic.  HENT:     Head: Normocephalic and atraumatic.     Jaw: No trismus.     Right Ear: Tympanic membrane, ear canal and external ear normal.     Left Ear: Tympanic membrane, ear canal and external ear normal.     Nose: Nose normal. No mucosal edema or rhinorrhea.     Right Sinus: No maxillary sinus tenderness or frontal sinus tenderness.     Left Sinus: No maxillary sinus tenderness or frontal sinus tenderness.     Mouth/Throat:     Pharynx: Uvula midline. No oropharyngeal exudate, posterior oropharyngeal erythema or uvula swelling.  Eyes:     General: Lids are normal. No scleral icterus.       Right eye: No discharge.        Left eye: No discharge.     Conjunctiva/sclera: Conjunctivae normal.  Neck:     Trachea: Trachea and phonation normal. No tracheal deviation.  Cardiovascular:     Rate and Rhythm: Normal rate and regular rhythm.     Pulses: Normal pulses.          Radial pulses are 2+ on the right side and 2+ on the left side.       Posterior tibial pulses are 2+ on the right side and 2+ on the left side.     Heart sounds: Normal heart sounds. No murmur heard.    No friction rub. No gallop.     Comments: Audible mechanical valve Pulmonary:     Effort: Pulmonary effort is normal.     Breath sounds: Normal breath sounds. No wheezing, rhonchi or rales.   Abdominal:  General: Bowel sounds are normal. There is no distension.     Palpations: Abdomen is soft.     Tenderness: There is no abdominal tenderness.  Musculoskeletal:     Cervical back: Normal range of motion and neck supple.     Right lower leg: No edema.     Left lower leg: No edema.  Skin:    General: Skin is warm and dry.     Capillary Refill: Capillary refill takes less than 2 seconds.     Findings: No rash.  Neurological:     Mental Status: He is alert.     Gait: Gait normal.  Psychiatric:        Mood and Affect: Mood normal.        Speech: Speech normal.        Behavior: Behavior normal. Behavior is cooperative.         Assessment & Plan:   Problem List Items Addressed This Visit       Cardiovascular and Mediastinum   Cardiomyopathy, secondary ([place])   Relevant Orders   Comprehensive metabolic panel   CBC with Differential/Platelet   Lipid panel     Other   H/O aortic valve replacement    Doing well, per cardiology, most recent cardiology office visit reviewed today with the patient      Presence of cardiac defibrillator   Pain in lower back    History of low back pain that the patient does not recall when he might of had this before he endorses low back pain without radiation he is concerned about his kidneys      Relevant Orders   Urine Microalbumin w/creat. ratio   Urinalysis, Routine w reflex microscopic   Long term (current) use of anticoagulants    PT/INR monitoring per cardiology rechecking CBC today some acute blood loss with his surgery and mild anemia when checked last February      Relevant Orders   Comprehensive metabolic panel   CBC with Differential/Platelet   Other Visit Diagnoses     Urinary frequency    -  Primary   Multiple medications causing diuresis, no stream changes or straining, no abdominal pain, hematuria or dysuria   Relevant Orders   Urine Microalbumin w/creat. ratio   Comprehensive metabolic panel    Urinalysis, Routine w reflex microscopic   PSA   Dyslipidemia       Advised by cardiology to start statins, not on meds will recheck labs, i explained benefits of statin to the patient   Relevant Orders   Comprehensive metabolic panel   Lipid panel   Medication monitoring encounter       Relevant Orders   Urine Microalbumin w/creat. ratio   Comprehensive metabolic panel   CBC with Differential/Platelet   Urinalysis, Routine w reflex microscopic   Lipid panel        Return for reschedule his CPE for after 07/11/2022.   Delsa Grana, PA-C 05/07/22 11:54 AM

## 2022-05-07 NOTE — Assessment & Plan Note (Signed)
PT/INR monitoring per cardiology rechecking CBC today some acute blood loss with his surgery and mild anemia when checked last February

## 2022-05-08 LAB — CBC WITH DIFFERENTIAL/PLATELET
Basophils Absolute: 0.1 10*3/uL (ref 0.0–0.2)
Basos: 1 %
EOS (ABSOLUTE): 0.1 10*3/uL (ref 0.0–0.4)
Eos: 3 %
Hematocrit: 47 % (ref 37.5–51.0)
Hemoglobin: 15.5 g/dL (ref 13.0–17.7)
Immature Grans (Abs): 0 10*3/uL (ref 0.0–0.1)
Immature Granulocytes: 0 %
Lymphocytes Absolute: 1.2 10*3/uL (ref 0.7–3.1)
Lymphs: 26 %
MCH: 29.8 pg (ref 26.6–33.0)
MCHC: 33 g/dL (ref 31.5–35.7)
MCV: 90 fL (ref 79–97)
Monocytes Absolute: 0.6 10*3/uL (ref 0.1–0.9)
Monocytes: 13 %
Neutrophils Absolute: 2.6 10*3/uL (ref 1.4–7.0)
Neutrophils: 57 %
Platelets: 180 10*3/uL (ref 150–450)
RBC: 5.21 x10E6/uL (ref 4.14–5.80)
RDW: 11.9 % (ref 11.6–15.4)
WBC: 4.6 10*3/uL (ref 3.4–10.8)

## 2022-05-08 LAB — COMPREHENSIVE METABOLIC PANEL
ALT: 27 IU/L (ref 0–44)
AST: 25 IU/L (ref 0–40)
Albumin/Globulin Ratio: 1.5 (ref 1.2–2.2)
Albumin: 4.6 g/dL (ref 4.1–5.1)
Alkaline Phosphatase: 35 IU/L — ABNORMAL LOW (ref 44–121)
BUN/Creatinine Ratio: 11 (ref 9–20)
BUN: 14 mg/dL (ref 6–24)
Bilirubin Total: 0.7 mg/dL (ref 0.0–1.2)
CO2: 24 mmol/L (ref 20–29)
Calcium: 9.4 mg/dL (ref 8.7–10.2)
Chloride: 103 mmol/L (ref 96–106)
Creatinine, Ser: 1.23 mg/dL (ref 0.76–1.27)
Globulin, Total: 3 g/dL (ref 1.5–4.5)
Glucose: 90 mg/dL (ref 70–99)
Potassium: 4.7 mmol/L (ref 3.5–5.2)
Sodium: 140 mmol/L (ref 134–144)
Total Protein: 7.6 g/dL (ref 6.0–8.5)
eGFR: 72 mL/min/{1.73_m2} (ref >59)

## 2022-05-08 LAB — URINALYSIS, ROUTINE W REFLEX MICROSCOPIC
Bilirubin, UA: NEGATIVE
Ketones, UA: NEGATIVE
Leukocytes,UA: NEGATIVE
Nitrite, UA: NEGATIVE
Protein,UA: NEGATIVE
RBC, UA: NEGATIVE
Specific Gravity, UA: 1.025 (ref 1.005–1.030)
Urobilinogen, Ur: 0.2 mg/dL (ref 0.2–1.0)
pH, UA: 5.5 (ref 5.0–7.5)

## 2022-05-08 LAB — MICROALBUMIN / CREATININE URINE RATIO
Creatinine, Urine: 95.2 mg/dL
Microalb/Creat Ratio: 3 mg/g creat (ref 0–29)
Microalbumin, Urine: 3.2 ug/mL

## 2022-05-08 LAB — PSA: Prostate Specific Ag, Serum: 1.2 ng/mL (ref 0.0–4.0)

## 2022-05-08 LAB — LIPID PANEL
Chol/HDL Ratio: 4.4 ratio (ref 0.0–5.0)
Cholesterol, Total: 209 mg/dL — ABNORMAL HIGH (ref 100–199)
HDL: 47 mg/dL (ref >39)
LDL Chol Calc (NIH): 138 mg/dL — ABNORMAL HIGH (ref 0–99)
Triglycerides: 133 mg/dL (ref 0–149)
VLDL Cholesterol Cal: 24 mg/dL (ref 5–40)

## 2022-05-10 ENCOUNTER — Ambulatory Visit (INDEPENDENT_AMBULATORY_CARE_PROVIDER_SITE_OTHER): Payer: 59

## 2022-05-10 DIAGNOSIS — I255 Ischemic cardiomyopathy: Secondary | ICD-10-CM | POA: Diagnosis not present

## 2022-05-10 LAB — CUP PACEART REMOTE DEVICE CHECK
Battery Remaining Longevity: 54 mo
Battery Remaining Percentage: 57 %
Brady Statistic RV Percent Paced: 0 %
Date Time Interrogation Session: 20231221030100
HighPow Impedance: 56 Ohm
Implantable Lead Connection Status: 753985
Implantable Lead Implant Date: 20080214
Implantable Lead Location: 753860
Implantable Lead Model: 185
Implantable Lead Serial Number: 178017
Implantable Pulse Generator Implant Date: 20141216
Lead Channel Impedance Value: 678 Ohm
Lead Channel Pacing Threshold Amplitude: 1.1 V
Lead Channel Pacing Threshold Pulse Width: 0.4 ms
Lead Channel Setting Pacing Amplitude: 2 V
Lead Channel Setting Pacing Pulse Width: 0.4 ms
Lead Channel Setting Sensing Sensitivity: 0.6 mV
Pulse Gen Serial Number: 126781

## 2022-05-14 ENCOUNTER — Other Ambulatory Visit: Payer: Self-pay | Admitting: Cardiovascular Disease

## 2022-05-23 ENCOUNTER — Encounter: Payer: Self-pay | Admitting: Internal Medicine

## 2022-05-23 ENCOUNTER — Ambulatory Visit: Payer: 59 | Attending: Cardiovascular Disease

## 2022-05-23 ENCOUNTER — Ambulatory Visit: Payer: 59 | Admitting: Internal Medicine

## 2022-05-23 VITALS — BP 118/82 | HR 72 | Temp 97.6°F | Ht 69.5 in | Wt 214.0 lb

## 2022-05-23 DIAGNOSIS — Z952 Presence of prosthetic heart valve: Secondary | ICD-10-CM

## 2022-05-23 DIAGNOSIS — Z5181 Encounter for therapeutic drug level monitoring: Secondary | ICD-10-CM

## 2022-05-23 DIAGNOSIS — I10 Essential (primary) hypertension: Secondary | ICD-10-CM

## 2022-05-23 DIAGNOSIS — Z7901 Long term (current) use of anticoagulants: Secondary | ICD-10-CM

## 2022-05-23 DIAGNOSIS — I429 Cardiomyopathy, unspecified: Secondary | ICD-10-CM | POA: Diagnosis not present

## 2022-05-23 DIAGNOSIS — I472 Ventricular tachycardia, unspecified: Secondary | ICD-10-CM

## 2022-05-23 DIAGNOSIS — I350 Nonrheumatic aortic (valve) stenosis: Secondary | ICD-10-CM

## 2022-05-23 LAB — POCT INR: INR: 2.4 (ref 2.0–3.0)

## 2022-05-23 NOTE — Patient Instructions (Signed)
TAKE 1 TABLET ONLY TODAY THEN  Continue taking warfarin 1.5 tablets daily. Recheck INR in 6 weeks. Per Dr Vivi Martens note 07/05/21 pt has ON-X valve placed 04/27/21, after 3 months can decrease INR goal 1.5-2.0 with '81mg'$  ASA.  Be consistent with green (2 times per week)  [facility] Clinic [phone]

## 2022-05-23 NOTE — Assessment & Plan Note (Signed)
Has ICD Carvedilol 6.25 bid

## 2022-05-23 NOTE — Assessment & Plan Note (Signed)
BP Readings from Last 3 Encounters:  05/23/22 118/82  05/07/22 104/70  12/01/21 118/80   Controlled on heart meds

## 2022-05-23 NOTE — Assessment & Plan Note (Addendum)
Feels like his EF may have improved from last echo  On entresto 24/26 bid, jardiance 10 spironolactone 12.5 daily, carvedilol 6.25 bid

## 2022-05-23 NOTE — Assessment & Plan Note (Signed)
Twice--last 2022 Doing well Has CT follow up Warfarin followed at cardiology office

## 2022-05-23 NOTE — Progress Notes (Signed)
Subjective:    Patient ID: [name], male    DOB: May 02, 1976, 47 y.o.   MRN: [id number]  HPI Here to establish care  Wants to move from PCP in [facility] : [facility] Had only been seeing NP  Main issue is heart disease VSD corrected at age 44 AVR twice (age 89, and again 2022)---bicuspid aortic valve Is on warfarin Has ICD ---recurrent vent tach (has been shocked for this) Last echo 1/23 showed EF 30-35%. He feels that has probably improved since then (less symptoms) Off his exercise routine now--hopes to restart  Current Outpatient Medications on File Prior to Visit  Medication Sig Dispense Refill   acetaminophen (TYLENOL) 500 MG tablet Take 500 mg by mouth every 6 (six) hours as needed for moderate pain.     amoxicillin (AMOXIL) 500 MG capsule Take 4 capsules by mouth one hour prior to dental exam 12 capsule 3   aspirin EC 81 MG EC tablet Take 1 tablet (81 mg total) by mouth daily. Swallow whole. 30 tablet 11   carvedilol (COREG) 6.25 MG tablet Take 1 tablet (6.25 mg total) by mouth 2 (two) times daily. 180 tablet 3   empagliflozin (JARDIANCE) 10 MG TABS tablet Take 1 tablet (10 mg total) by mouth daily before breakfast. 30 tablet 11   loratadine (CLARITIN) 10 MG tablet Take by mouth as needed.     magnesium oxide (MAG-OX) 400 MG tablet Take 1 tablet (400 mg total) by mouth 2 (two) times daily. 180 tablet 3   meclizine (ANTIVERT) 25 MG tablet Take 0.5-1 tablets (12.5-25 mg total) by mouth 3 (three) times daily as needed for dizziness. 30 tablet 2   Multiple Vitamin (MULTIVITAMIN) tablet Take 2 tablets by mouth daily.     ondansetron (ZOFRAN-ODT) 4 MG disintegrating tablet Take by mouth as needed.     ranolazine (RANEXA) 1000 MG SR tablet Take 1 tablet (1,000 mg total) by mouth 2 (two) times daily. 180 tablet 3   sacubitril-valsartan (ENTRESTO) 24-26 MG Take 1 tablet by mouth 2 (two) times daily. 180 tablet 3   spironolactone (ALDACTONE) 25 MG tablet Take 0.5 tablets (12.5 mg total) by  mouth daily. 45 tablet 1   warfarin (COUMADIN) 5 MG tablet TAKE ONE AND ONE-HALF TABLETS DAILY OR AS DIRECTED BY THE COUMADIN CLINIC 150 tablet 3   No current facility-administered medications on file prior to visit.    Allergies  Allergen Reactions   Shellfish Allergy Nausea And Vomiting    Past Medical History:  Diagnosis Date   AICD (automatic cardioverter/defibrillator) present    Anxiety    Aortic valve replaced    Requiring replacement, specifics not available   Complication of anesthesia    N & V   Dysrhythmia    Hearing loss of both ears 12/22/2015   Heart murmur    Hypertension    IBD (inflammatory bowel disease) 09/23/2015   ICD (implantable cardiac defibrillator), BSX single    Myocardial infarction ([place])    Obesity 12/22/2015   PONV (postoperative nausea and vomiting)    Secondary cardiomyopathy ([place])    Syncope    Ventricular septal defect    Ventricular tachycardia ([place])    appropriate VT shock therapy /13    Past Surgical History:  Procedure Laterality Date   ANGIOPLASTY     RCA repair with vein angioplasty following injury with the aforementioned surgery   AORTIC VALVE REPLACEMENT     Bioprosthesis   AORTIC VALVE REPLACEMENT N/A 04/27/2021   Procedure: REDO AORTIC  VALVE REPLACEMENT (AVR) USING ON-X 23MM AORTIC VALVE;  Surgeon: Gaye Pollack, MD;  Location: [facility];  Service: Open Heart Surgery;  Laterality: N/A;   CARDIAC CATHETERIZATION     CARDIAC DEFIBRILLATOR PLACEMENT     CARDIAC VALVE REPLACEMENT     CHOLECYSTECTOMY     COLONOSCOPY WITH PROPOFOL N/A 12/06/2015   Procedure: COLONOSCOPY WITH PROPOFOL;  Surgeon: [name], MD;  Location: [facility] ENDOSCOPY;  Service: Endoscopy;  Laterality: N/A;   CORONARY ANGIOPLASTY     CORONARY ARTERY BYPASS GRAFT     IMPLANTABLE CARDIOVERTER DEFIBRILLATOR GENERATOR CHANGE N/A 05/05/2013   Procedure: IMPLANTABLE CARDIOVERTER DEFIBRILLATOR GENERATOR CHANGE;  Surgeon: Deboraha Sprang, MD;  Location: [facility] CATH LAB;   Service: Cardiovascular;  Laterality: N/A;   INSERT / REPLACE / REMOVE PACEMAKER     RIGHT/LEFT HEART CATH AND CORONARY ANGIOGRAPHY N/A 03/01/2021   Procedure: RIGHT/LEFT HEART CATH AND CORONARY ANGIOGRAPHY;  Surgeon: Sherren Mocha, MD;  Location: [place] CV LAB;  Service: Cardiovascular;  Laterality: N/A;   TEE WITHOUT CARDIOVERSION N/A 04/27/2021   Procedure: TRANSESOPHAGEAL ECHOCARDIOGRAM (TEE);  Surgeon: Gaye Pollack, MD;  Location: [place];  Service: Open Heart Surgery;  Laterality: N/A;   VSD REPAIR      Family History  Problem Relation Age of Onset   Heart disease Father    Hypertension Maternal Grandmother    Cancer Maternal Grandmother    Hypertension Maternal Grandfather    Cancer Maternal Grandfather     Social History   Socioeconomic History   Marital status: Married    Spouse name: Anderson Malta   Number of children: 2   Years of education: 17   Highest education level: Bachelor's degree (e.g., BA, AB, BS)  Occupational History   Occupation: IT    Employer: [employer]  Tobacco Use   Smoking status: Never    Passive exposure: Past   Smokeless tobacco: Never  Vaping Use   Vaping Use: Never used  Substance and Sexual Activity   Alcohol use: No   Drug use: No   Sexual activity: Yes    Partners: Female  Other Topics Concern   Not on file  Social History Narrative   Has living will   Wife is health care POA--alternate is sister Janace Hoard   Would accept resuscitation    No feeding tube if persistent cognitively unaware   Social Determinants of Health   Financial Resource Strain: Low Risk  (02/17/2020)   Overall Financial Resource Strain (CARDIA)    Difficulty of Paying Living Expenses: Not hard at all  Food Insecurity: No Food Insecurity (02/17/2020)   Hunger Vital Sign    Worried About Running Out of Food in the Last Year: Never true    Ran Out of Food in the Last Year: Never true  Transportation Needs: No Transportation Needs (02/17/2020)   PRAPARE -  Hydrologist (Medical): No    Lack of Transportation (Non-Medical): No  Physical Activity: Inactive (02/17/2020)   Exercise Vital Sign    Days of Exercise per Week: 0 days    Minutes of Exercise per Session: 0 min  Stress: No Stress Concern Present (02/17/2020)   [place]    Feeling of Stress : Not at all  Social Connections: Moderately Integrated (02/17/2020)   Social Connection and Isolation Panel [NHANES]    Frequency of Communication with Friends and Family: Twice a week    Frequency of Social Gatherings with  Friends and Family: Twice a week    Attends Religious Services: 1 to 4 times per year    Active Member of [employer] or Organizations: No    Attends Archivist Meetings: Never    Marital Status: Married  Human resources officer Violence: Not At Risk (02/17/2020)   Humiliation, Afraid, Rape, and Kick questionnaire    Fear of Current or Ex-Partner: No    Emotionally Abused: No    Physically Abused: No    Sexually Abused: No   Review of Systems Sleep is variable---occ bad nights (will wake up with mind going) Appetite is good Weight is steady Colon in 2017 for bowel symptoms---was fine Prefers no flu or COVID vaccines    Objective:   Physical Exam Constitutional:      Appearance: Normal appearance.  Cardiovascular:     Rate and Rhythm: Normal rate and regular rhythm.     Heart sounds:     No gallop.     Comments: Valve click Soft aortic systolic murmur Normal pulse left foot---faint in right foot Pulmonary:     Effort: Pulmonary effort is normal.     Breath sounds: Normal breath sounds. No wheezing or rales.  Musculoskeletal:     Cervical back: Neck supple.     Right lower leg: No edema.     Left lower leg: No edema.  Lymphadenopathy:     Cervical: No cervical adenopathy.  Neurological:     Mental Status: He is alert.  Psychiatric:        Mood and Affect: Mood normal.         Behavior: Behavior normal.            Assessment & Plan:

## 2022-06-01 ENCOUNTER — Other Ambulatory Visit: Payer: Self-pay | Admitting: Nurse Practitioner

## 2022-06-01 ENCOUNTER — Other Ambulatory Visit: Payer: Self-pay | Admitting: Cardiovascular Disease

## 2022-06-01 MED ORDER — EMPAGLIFLOZIN 10 MG PO TABS: 10.0000 mg | ORAL_TABLET | Freq: Every day | ORAL | 6 refills | Status: DC

## 2022-06-01 NOTE — Progress Notes (Signed)
Remote ICD transmission.   

## 2022-06-05 ENCOUNTER — Other Ambulatory Visit: Payer: Self-pay | Admitting: Cardiovascular Disease

## 2022-06-05 MED ORDER — MAGNESIUM OXIDE 400 MG PO TABS: 400.0000 mg | ORAL_TABLET | Freq: Two times a day (BID) | ORAL | 1 refills | Status: DC

## 2022-06-21 ENCOUNTER — Encounter: Payer: Self-pay | Admitting: Cardiovascular Disease

## 2022-07-04 ENCOUNTER — Ambulatory Visit: Payer: 59

## 2022-07-11 ENCOUNTER — Ambulatory Visit: Payer: 59 | Attending: Cardiovascular Disease | Admitting: Pharmacist

## 2022-07-11 DIAGNOSIS — Z952 Presence of prosthetic heart valve: Secondary | ICD-10-CM | POA: Diagnosis not present

## 2022-07-11 DIAGNOSIS — I350 Nonrheumatic aortic (valve) stenosis: Secondary | ICD-10-CM | POA: Diagnosis not present

## 2022-07-11 DIAGNOSIS — Z7901 Long term (current) use of anticoagulants: Secondary | ICD-10-CM | POA: Diagnosis not present

## 2022-07-11 LAB — POCT INR: INR: 2 (ref 2.0–3.0)

## 2022-07-11 NOTE — Patient Instructions (Signed)
Description   Continue taking warfarin 1.5 tablets daily. Recheck INR in 6 weeks. Per Dr Vivi Martens note 07/05/21 pt has ON-X valve placed 04/27/21, after 3 months can decrease INR goal 1.5-2.0 with 81m ASA.  Be consistent with green (2 times per week)  [facility]ou[facility]in Clinic [phone]

## 2022-08-09 ENCOUNTER — Ambulatory Visit (INDEPENDENT_AMBULATORY_CARE_PROVIDER_SITE_OTHER): Payer: 59

## 2022-08-09 DIAGNOSIS — I255 Ischemic cardiomyopathy: Secondary | ICD-10-CM

## 2022-08-09 LAB — CUP PACEART REMOTE DEVICE CHECK
Battery Remaining Longevity: 54 mo
Battery Remaining Percentage: 54 %
Brady Statistic RV Percent Paced: 0 %
Date Time Interrogation Session: 20240321030100
HighPow Impedance: 56 Ohm
Implantable Lead Connection Status: 753985
Implantable Lead Implant Date: 20080214
Implantable Lead Location: 753860
Implantable Lead Model: 185
Implantable Lead Serial Number: 178017
Implantable Pulse Generator Implant Date: 20141216
Lead Channel Impedance Value: 613 Ohm
Lead Channel Pacing Threshold Amplitude: 1.1 V
Lead Channel Pacing Threshold Pulse Width: 0.4 ms
Lead Channel Setting Pacing Amplitude: 2 V
Lead Channel Setting Pacing Pulse Width: 0.4 ms
Lead Channel Setting Sensing Sensitivity: 0.6 mV
Pulse Gen Serial Number: 126781

## 2022-08-22 ENCOUNTER — Ambulatory Visit: Payer: 59

## 2022-08-29 ENCOUNTER — Ambulatory Visit: Payer: 59 | Attending: Cardiovascular Disease

## 2022-08-29 DIAGNOSIS — I350 Nonrheumatic aortic (valve) stenosis: Secondary | ICD-10-CM | POA: Diagnosis not present

## 2022-08-29 DIAGNOSIS — Z952 Presence of prosthetic heart valve: Secondary | ICD-10-CM

## 2022-08-29 DIAGNOSIS — Z7901 Long term (current) use of anticoagulants: Secondary | ICD-10-CM | POA: Diagnosis not present

## 2022-08-29 LAB — POCT INR: INR: 1.7 — AB (ref 2.0–3.0)

## 2022-08-29 NOTE — Patient Instructions (Signed)
Description   Continue taking warfarin 1.5 tablets daily. Recheck INR in 6 weeks.  Be consistent with green (2 times per week)  Coumadin Clinic [phone]

## 2022-09-03 ENCOUNTER — Telehealth: Payer: Self-pay | Admitting: Cardiovascular Disease

## 2022-09-03 MED ORDER — ENTRESTO 24-26 MG PO TABS: 1.0000 | ORAL_TABLET | Freq: Two times a day (BID) | ORAL | 0 refills | Status: DC

## 2022-09-03 NOTE — Telephone Encounter (Signed)
Pt's medication was sent to pt's pharmacy as requested. Confirmation received.  °

## 2022-09-03 NOTE — Telephone Encounter (Signed)
*  STAT* If patient is at the pharmacy, call can be transferred to refill team.   1. Which medications need to be refilled? (please list name of each medication and dose if known)  sacubitril-valsartan (ENTRESTO) 24-26 MG  2. Which pharmacy/location (including street and city if local pharmacy) is medication to be sent to? [facility] - [address] [place], [place], [place] [address]   3. Do they need a 30 day or 90 day supply?  90 day supply  Patient states he is completely out of medication.

## 2022-09-12 NOTE — Progress Notes (Signed)
Remote ICD transmission.   

## 2022-10-10 ENCOUNTER — Ambulatory Visit: Payer: 59 | Attending: Cardiovascular Disease

## 2022-10-10 DIAGNOSIS — I350 Nonrheumatic aortic (valve) stenosis: Secondary | ICD-10-CM | POA: Diagnosis not present

## 2022-10-10 DIAGNOSIS — Z952 Presence of prosthetic heart valve: Secondary | ICD-10-CM

## 2022-10-10 DIAGNOSIS — Z7901 Long term (current) use of anticoagulants: Secondary | ICD-10-CM

## 2022-10-10 LAB — POCT INR: INR: 2 (ref 2.0–3.0)

## 2022-10-10 NOTE — Patient Instructions (Signed)
Continue taking warfarin 1.5 tablets daily. Recheck INR in 6 weeks.  Be consistent with green (2 times per week)  Coumadin Clinic [phone]

## 2022-10-11 DIAGNOSIS — I5020 Unspecified systolic (congestive) heart failure: Secondary | ICD-10-CM | POA: Insufficient documentation

## 2022-10-11 NOTE — Progress Notes (Signed)
Cardiology Office Note Date:  10/12/2022  Patient ID:  [name], [name] 12-13-1975, MRN [id number] PCP:  Karie Schwalbe, MD  Cardiologist:  Tonny Bollman, MD Electrophysiologist: Sherryl Manges, MD    Chief Complaint: 1 year ICD follow-up  History of Present Illness: [name] is a 48 y.o. male with PMH notable for ICM, PVCs, HFrEF, VT s/p ICD, parox AFib, HTN, anomalous RCA s/p repair, s/p AVR; seen today for Sherryl Manges, MD for routine electrophysiology followup.   As a teenager was found to have a VSD on a sports physical.  He underwent surgical VSD repair at 47 years old [facility]).  Was then found to have an aortic valve problem at age 59 and underwent bioprosthetic aortic valve replacement ([facility]).  During that surgery there was an injury to the RCA and he had an infarct.  Was found to have episodes of VT in 2008 and was treated with medical therapy and an ICD.  Has had a few appropriate shocks for VT over the years and has had one ATP therapy delivered as well. Referral to [facility] for consideration of VT ablation, thought to be epicardial with mapping of the CS complicated by dissection, case aborted.  Has experienced progressive bioprosthetic aortic valve dysfunction with severe prosthetic valve stenosis and moderately severe LV systolic dysfunction.  Ultimately treated with third time redo sternotomy and AVR using a 23 mm ON-X mechanical valve on 04/27/2021. Post-op course uncomplicated.   Last saw Dr. Graciela Husbands 06/2021, not having any further VT by device. Has followed up regularly with general cardiology, most recently 11/2021.  Today he tells me that he is overall doing well. Is able to walk 1.5-2 miles everyday, using elliptical without SOB, palpitations, chest pain.   He checks his BP sporadically, readings are as low as 110s/70. Systolics as high as 130-140s.  He does have episodes of feeling dizzy, does not check BP when having those symptoms.   He does not have edema, doesn't think he's fluid overloaded.     Device Information: Bos Sci ICD, imp 06/2006; ICM, VT Gen change 04/2013   Past Medical History:  Diagnosis Date   AICD (automatic cardioverter/defibrillator) present    Anxiety    Aortic valve replaced    Requiring replacement, specifics not available   Complication of anesthesia    N & V   Dysrhythmia    Hearing loss of both ears 12/22/2015   Heart murmur    Hypertension    IBD (inflammatory bowel disease) 09/23/2015   ICD (implantable cardiac defibrillator), BSX single    Myocardial infarction [facility])    Obesity 12/22/2015   PONV (postoperative nausea and vomiting)    Secondary cardiomyopathy ([facility])    Syncope    Ventricular septal defect    Ventricular tachycardia ([facility])    appropriate VT shock therapy /13    Past Surgical History:  Procedure Laterality Date   ANGIOPLASTY     RCA repair with vein angioplasty following injury with the aforementioned surgery   AORTIC VALVE REPLACEMENT     Bioprosthesis   AORTIC VALVE REPLACEMENT N/A 04/27/2021   Procedure: REDO AORTIC VALVE REPLACEMENT (AVR) USING ON-X AORTIC VALVE;  Surgeon: Alleen Borne, MD;  Location: [facility];  Service: Open Heart Surgery;  Laterality: N/A;   CARDIAC CATHETERIZATION     CARDIAC DEFIBRILLATOR PLACEMENT     CARDIAC VALVE REPLACEMENT     CHOLECYSTECTOMY     COLONOSCOPY WITH PROPOFOL  N/A 12/06/2015   Procedure: COLONOSCOPY WITH PROPOFOL;  Surgeon: Midge Minium, MD;  Location: [facility] ENDOSCOPY;  Service: Endoscopy;  Laterality: N/A;   CORONARY ANGIOPLASTY     CORONARY ARTERY BYPASS GRAFT     IMPLANTABLE CARDIOVERTER DEFIBRILLATOR GENERATOR CHANGE N/A 05/05/2013   Procedure: IMPLANTABLE CARDIOVERTER DEFIBRILLATOR GENERATOR CHANGE;  Surgeon: Duke Salvia, MD;  Location: [facility] CATH LAB;  Service: Cardiovascular;  Laterality: N/A;   INSERT / REPLACE / REMOVE PACEMAKER     RIGHT/LEFT HEART CATH AND CORONARY ANGIOGRAPHY N/A 03/01/2021    Procedure: RIGHT/LEFT HEART CATH AND CORONARY ANGIOGRAPHY;  Surgeon: Tonny Bollman, MD;  Location: [facility] INVASIVE CV LAB;  Service: Cardiovascular;  Laterality: N/A;   TEE WITHOUT CARDIOVERSION N/A 04/27/2021   Procedure: TRANSESOPHAGEAL ECHOCARDIOGRAM (TEE);  Surgeon: Alleen Borne, MD;  Location: [facility] OR;  Service: Open Heart Surgery;  Laterality: N/A;   VSD REPAIR      Current Outpatient Medications  Medication Instructions   acetaminophen (TYLENOL) 500 mg, Oral, Every 6 hours PRN   amoxicillin (AMOXIL) 500 MG capsule Take 4 capsules by mouth one hour prior to dental exam   aspirin EC 81 mg, Oral, Daily, Swallow whole.   carvedilol (COREG) 6.25 mg, Oral, 2 times daily   empagliflozin (JARDIANCE) 10 mg, Oral, Daily before breakfast   loratadine (CLARITIN) 10 MG tablet Oral, As needed   magnesium oxide (MAG-OX) 400 mg, Oral, 2 times daily   meclizine (ANTIVERT) 12.5-25 mg, Oral, 3 times daily PRN   Multiple Vitamin (MULTIVITAMIN) tablet 2 tablets, Oral, Daily   ondansetron (ZOFRAN-ODT) 4 MG disintegrating tablet Oral, As needed   ranolazine (RANEXA) 1,000 mg, Oral, 2 times daily   sacubitril-valsartan (ENTRESTO) 24-26 MG 1 tablet, Oral, 2 times daily   spironolactone (ALDACTONE) 12.5 mg, Oral, Daily   warfarin (COUMADIN) 5 MG tablet TAKE ONE AND ONE-HALF TABLETS DAILY OR AS DIRECTED BY THE COUMADIN CLINIC    Social History:  The patient  reports that he has never smoked. He has been exposed to tobacco smoke. He has never used smokeless tobacco. He reports that he does not drink alcohol and does not use drugs.   Family History:  The patient's family history includes Cancer in his maternal grandfather and maternal grandmother; Heart disease in his father; Hypertension in his maternal grandfather and maternal grandmother.  ROS:  Please see the history of present illness. All other systems are reviewed and otherwise negative.   PHYSICAL EXAM:  VS:  BP 130/80 (BP Location: Left Arm, Patient  Position: Sitting, Cuff Size: Normal)   Pulse 81   Ht 5\' 10"  (1.778 m)   Wt 215 lb (97.5 kg)   SpO2 99%   BMI 30.85 kg/m  BMI: Body mass index is 30.85 kg/m.  GEN- The patient is well appearing, alert and oriented x 3 today.   Lungs- Clear to ausculation bilaterally, normal work of breathing.  Heart- Regular rate and rhythm, mechanical click, no rubs or gallops Extremities- No peripheral edema, warm, dry Skin-   device pocket well-healed, no tethering   Device interrogation done today and reviewed by myself:  Battery 4 years Lead thresholds, impedence, sensing stable  2 episodes, both appear SVT No changes made today  EKG is ordered. Personal review of EKG from today shows:  NSR, rate 81; LBBB   Recent Labs: 05/07/2022: ALT 27; BUN 14; Creatinine, Ser 1.23; Hemoglobin 15.5; Platelets 180; Potassium 4.7; Sodium 140  05/07/2022: Chol/HDL Ratio 4.4; Cholesterol, Total 209; HDL 47; LDL Chol Calc (NIH)  138; Triglycerides 133   CrCl cannot be calculated (Patient's most recent lab result is older than the maximum 21 days allowed.).   Wt Readings from Last 3 Encounters:  10/12/22 215 lb (97.5 kg)  05/23/22 214 lb (97.1 kg)  05/07/22 214 lb 8 oz (97.3 kg)     Additional studies reviewed include: Previous EP, cardiology notes.   TTE, 06/07/2021 1. Left ventricular ejection fraction, by estimation, is 30 to 35%. The left ventricle has moderately decreased function. The left ventricle demonstrates regional wall motion abnormalities with severe anteroseptal, inferoseptal, and inferolateral hypokinesis and basal to mid inferior akinesis. The left ventricular internal cavity size was moderately dilated. There is mild left ventricular hypertrophy. Left ventricular diastolic parameters are  consistent with Grade I diastolic dysfunction (impaired relaxation).   2. Right ventricular systolic function is mildly reduced. The right ventricular size is normal. Tricuspid regurgitation signal is  inadequate for assessing PA pressure.   3. The mitral valve is normal in structure. No evidence of mitral valve regurgitation. No evidence of mitral stenosis.   4. Mechanical AVR (23 mm On-X valve). Mean gradient 8 mmHg, no significant stenosis. Trivial aortic insufficiency.   5. Left atrial size was mildly dilated.   6. The inferior vena cava is normal in size with greater than 50% respiratory variability, suggesting right atrial pressure of 3 mmHg.   LHC, 03/01/2021 1.  Single-vessel coronary artery disease with total occlusion of the RCA, collateralized from the left coronary artery (this is known from the patient's history) 2.  Patent left main, LAD, and left circumflex with no obstructive coronary artery disease in those vessels.  The RCA is collateralized from septal perforators of the LAD 3.  Severe bioprosthetic aortic valve stenosis with mean gradient 57 mmHg, peak to peak gradient 74 mmHg, calculated aortic valve area 0.7 cm 4.  Normal right heart hemodynamics and preserved cardiac output   Recommendations: Cardiac surgical referral for consideration of redo aortic valve replacement    ASSESSMENT AND PLAN:  #) VT #) s/p Bos Sci ICD in situ Device functioning well, see paceart for details No recent VT episodes Brief SVT episodes  #) HFrEF NYHA class I Warm and dry on exam, no s/s of fluid overload Good exercise tolerance GDMT: entresto, coreg, spiro, jardiance No diuretic - will update BNP and BMP today Encouraged him to check BP more regularly to correlate dizziness with ?hypotension  #) CAD No ischemic s/s Update lipid panel today  #) s/p AVR Followed by Dr. Excell Seltzer Past-due for updated echo     Current medicines are reviewed at length with the patient today.   The patient does not have concerns regarding his medicines.  The following changes were made today:  none  Labs/ tests ordered today include:  Orders Placed This Encounter  Procedures   Lipid panel    Basic Metabolic Panel (BMET)   Magnesium   B Nat Peptide   EKG 12-Lead   ECHOCARDIOGRAM COMPLETE     Disposition: Follow up with Dr. Graciela Husbands or EP APP in 12 months  Past-due for general cardiology (Dr. Excell Seltzer) follow-up, will arrange  Signed, Sherie Don, NP  10/12/22  11:43 AM  Electrophysiology [facility]

## 2022-10-12 ENCOUNTER — Other Ambulatory Visit
Admission: RE | Admit: 2022-10-12 | Discharge: 2022-10-12 | Disposition: A | Discharge status: Home / Self Care | Payer: 59 | Attending: Cardiology | Admitting: Cardiology

## 2022-10-12 ENCOUNTER — Ambulatory Visit: Payer: 59 | Attending: Cardiology | Admitting: Cardiology

## 2022-10-12 ENCOUNTER — Encounter: Payer: Self-pay | Admitting: Cardiology

## 2022-10-12 VITALS — BP 130/80 | HR 81 | Ht 70.0 in | Wt 215.0 lb

## 2022-10-12 DIAGNOSIS — I472 Ventricular tachycardia, unspecified: Secondary | ICD-10-CM | POA: Insufficient documentation

## 2022-10-12 DIAGNOSIS — I5020 Unspecified systolic (congestive) heart failure: Secondary | ICD-10-CM | POA: Insufficient documentation

## 2022-10-12 DIAGNOSIS — Z13220 Encounter for screening for lipoid disorders: Secondary | ICD-10-CM | POA: Diagnosis present

## 2022-10-12 DIAGNOSIS — Z79899 Other long term (current) drug therapy: Secondary | ICD-10-CM

## 2022-10-12 DIAGNOSIS — I350 Nonrheumatic aortic (valve) stenosis: Secondary | ICD-10-CM | POA: Diagnosis not present

## 2022-10-12 DIAGNOSIS — Z953 Presence of xenogenic heart valve: Secondary | ICD-10-CM

## 2022-10-12 LAB — CUP PACEART INCLINIC DEVICE CHECK
Date Time Interrogation Session: 20240524144610
HighPow Impedance: 38 Ohm
HighPow Impedance: 54 Ohm
Implantable Lead Connection Status: 753985
Implantable Lead Implant Date: 20080214
Implantable Lead Location: 753860
Implantable Lead Model: 185
Implantable Lead Serial Number: 178017
Implantable Pulse Generator Implant Date: 20141216
Lead Channel Impedance Value: 667 Ohm
Lead Channel Pacing Threshold Amplitude: 1 V
Lead Channel Pacing Threshold Pulse Width: 0.4 ms
Lead Channel Sensing Intrinsic Amplitude: 20.8 mV
Lead Channel Setting Pacing Amplitude: 2 V
Lead Channel Setting Pacing Pulse Width: 0.4 ms
Lead Channel Setting Sensing Sensitivity: 0.6 mV
Pulse Gen Serial Number: 126781

## 2022-10-12 LAB — BASIC METABOLIC PANEL
Anion gap: 6 (ref 5–15)
BUN: 17 mg/dL (ref 6–20)
CO2: 27 mmol/L (ref 22–32)
Calcium: 9 mg/dL (ref 8.9–10.3)
Chloride: 106 mmol/L (ref 98–111)
Creatinine, Ser: 1.23 mg/dL (ref 0.61–1.24)
GFR, Estimated: >60 mL/min (ref >60)
Glucose, Bld: 90 mg/dL (ref 70–99)
Potassium: 4.1 mmol/L (ref 3.5–5.1)
Sodium: 139 mmol/L (ref 135–145)

## 2022-10-12 LAB — LIPID PANEL
Cholesterol: 180 mg/dL (ref 0–200)
HDL: 43 mg/dL (ref >40)
LDL Cholesterol: 108 mg/dL — ABNORMAL HIGH (ref 0–99)
Total CHOL/HDL Ratio: 4.2 RATIO
Triglycerides: 147 mg/dL (ref <150)
VLDL: 29 mg/dL (ref 0–40)

## 2022-10-12 LAB — BRAIN NATRIURETIC PEPTIDE: B Natriuretic Peptide: 137.9 pg/mL — ABNORMAL HIGH (ref 0.0–100.0)

## 2022-10-12 LAB — MAGNESIUM: Magnesium: 2.1 mg/dL (ref 1.7–2.4)

## 2022-10-12 NOTE — Patient Instructions (Signed)
Medication Instructions:  Your physician recommends that you continue on your current medications as directed. Please refer to the Current Medication list given to you today.  *If you need a refill on your cardiac medications before your next appointment, please call your pharmacy*   Lab Work: Lipid panel, BMP, Mg, and BNP - Please go to the [facility]. You will check in at the front desk to the right as you walk into the atrium. Valet Parking is offered if needed. - No appointment needed. You may go any day between 7 am and 6 pm.  If you have labs (blood work) drawn today and your tests are completely normal, you will receive your results only by: MyChart Message (if you have MyChart) OR A paper copy in the mail If you have any lab test that is abnormal or we need to change your treatment, we will call you to review the results.   Testing/Procedures: Your physician has requested that you have an echocardiogram. Echocardiography is a painless test that uses sound waves to create images of your heart. It provides your doctor with information about the size and shape of your heart and how well your heart's chambers and valves are working. This procedure takes approximately one hour. There are no restrictions for this procedure. Please do NOT wear cologne, perfume, aftershave, or lotions (deodorant is allowed). Please arrive 15 minutes prior to your appointment time.  Follow-Up: At [facility], you and your health needs are our priority.  As part of our continuing mission to provide you with exceptional heart care, we have created designated Provider Care Teams.  These Care Teams include your primary Cardiologist (physician) and Advanced Practice Providers (APPs -  Physician Assistants and Nurse Practitioners) who all work together to provide you with the care you need, when you need it.  We recommend signing up for the patient portal called "MyChart".  Sign up information is  provided on this After Visit Summary.  MyChart is used to connect with patients for Virtual Visits (Telemedicine).  Patients are able to view lab/test results, encounter notes, upcoming appointments, etc.  Non-urgent messages can be sent to your provider as well.   To learn more about what you can do with MyChart, go to [email or web address].    Your next appointment:   1 year(s)  Provider:   Sherryl Manges, MD or Sherie Don, NP  Other Instructions Please schedule follow-up appointment with Dr. Excell Seltzer

## 2022-10-16 ENCOUNTER — Telehealth: Payer: Self-pay

## 2022-10-16 DIAGNOSIS — I472 Ventricular tachycardia, unspecified: Secondary | ICD-10-CM

## 2022-10-16 DIAGNOSIS — I5020 Unspecified systolic (congestive) heart failure: Secondary | ICD-10-CM

## 2022-10-16 MED ORDER — SPIRONOLACTONE 25 MG PO TABS: 25.0000 mg | ORAL_TABLET | Freq: Every day | ORAL | 0 refills | Status: DC

## 2022-10-16 NOTE — Telephone Encounter (Signed)
Orders per Sherie Don, NP entered as requested.

## 2022-10-30 ENCOUNTER — Telehealth: Payer: Self-pay

## 2022-10-30 NOTE — Telephone Encounter (Signed)
Left voicemail message to remind patient to have labs drawn 7-10 days after starting spironolactone as requested by Sherie Don, NP.

## 2022-11-06 ENCOUNTER — Encounter: Payer: Self-pay | Admitting: Internal Medicine

## 2022-11-06 ENCOUNTER — Ambulatory Visit (INDEPENDENT_AMBULATORY_CARE_PROVIDER_SITE_OTHER): Payer: 59 | Admitting: Internal Medicine

## 2022-11-06 VITALS — BP 110/80 | HR 91 | Temp 97.3°F | Ht 70.0 in | Wt 212.0 lb

## 2022-11-06 DIAGNOSIS — J029 Acute pharyngitis, unspecified: Secondary | ICD-10-CM | POA: Diagnosis not present

## 2022-11-06 NOTE — Progress Notes (Signed)
Subjective:    Patient ID: [name], male    DOB: [birth year], 47 y.o.   MRN: [id number]  HPI Here due to sore throat  Started 2 days ago Then body aches and chills yesterday Mild cough Night sweats Sore throat slightly better--but has pain with swallowing No SOB No clear neck glands enlarged  Took coricidin Son had similar symptoms last week--not evaluated  Current Outpatient Medications on File Prior to Visit  Medication Sig Dispense Refill   acetaminophen (TYLENOL) 500 MG tablet Take 500 mg by mouth every 6 (six) hours as needed for moderate pain.     amoxicillin (AMOXIL) 500 MG capsule Take 4 capsules by mouth one hour prior to dental exam 12 capsule 3   aspirin EC 81 MG EC tablet Take 1 tablet (81 mg total) by mouth daily. Swallow whole. 30 tablet 11   carvedilol (COREG) 6.25 MG tablet Take 1 tablet (6.25 mg total) by mouth 2 (two) times daily. 180 tablet 3   empagliflozin (JARDIANCE) 10 MG TABS tablet Take 1 tablet (10 mg total) by mouth daily before breakfast. 30 tablet 6   loratadine (CLARITIN) 10 MG tablet Take by mouth as needed.     magnesium oxide (MAG-OX) 400 MG tablet Take 1 tablet (400 mg total) by mouth 2 (two) times daily. 180 tablet 1   meclizine (ANTIVERT) 25 MG tablet Take 0.5-1 tablets (12.5-25 mg total) by mouth 3 (three) times daily as needed for dizziness. 30 tablet 2   Multiple Vitamin (MULTIVITAMIN) tablet Take 2 tablets by mouth daily.     ondansetron (ZOFRAN-ODT) 4 MG disintegrating tablet Take by mouth as needed.     ranolazine (RANEXA) 1000 MG SR tablet TAKE 1 TABLET TWICE A DAY 180 tablet 1   sacubitril-valsartan (ENTRESTO) 24-26 MG Take 1 tablet by mouth 2 (two) times daily. 180 tablet 0   spironolactone (ALDACTONE) 25 MG tablet Take 1 tablet (25 mg total) by mouth daily. 90 tablet 0   warfarin (COUMADIN) 5 MG tablet TAKE ONE AND ONE-HALF TABLETS DAILY OR AS DIRECTED BY THE COUMADIN CLINIC 150 tablet 3   No current facility-administered  medications on file prior to visit.    Allergies  Allergen Reactions   Shellfish Allergy Nausea And Vomiting    Past Medical History:  Diagnosis Date   AICD (automatic cardioverter/defibrillator) present    Anxiety    Aortic valve replaced    Requiring replacement, specifics not available   Complication of anesthesia    N & V   Dysrhythmia    Hearing loss of both ears 12/22/2015   Heart murmur    Hypertension    IBD (inflammatory bowel disease) 09/23/2015   ICD (implantable cardiac defibrillator), BSX single    Myocardial infarction [facility])    Obesity 12/22/2015   PONV (postoperative nausea and vomiting)    Secondary cardiomyopathy (HCC)    Syncope    Ventricular septal defect    Ventricular tachycardia (HCC)    appropriate VT shock therapy /13    Past Surgical History:  Procedure Laterality Date   ANGIOPLASTY     RCA repair with vein angioplasty following injury with the aforementioned surgery   AORTIC VALVE REPLACEMENT     Bioprosthesis   AORTIC VALVE REPLACEMENT N/A 04/27/2021   Procedure: REDO AORTIC VALVE REPLACEMENT (AVR) USING ON-X AORTIC VALVE;  Surgeon: Alleen Borne, MD;  Location: [facility] OR;  Service: Open Heart Surgery;  Laterality: N/A;   CARDIAC CATHETERIZATION  CARDIAC DEFIBRILLATOR PLACEMENT     CARDIAC VALVE REPLACEMENT     CHOLECYSTECTOMY     COLONOSCOPY WITH PROPOFOL N/A 12/06/2015   Procedure: COLONOSCOPY WITH PROPOFOL;  Surgeon: Midge Minium, MD;  Location: [facility] ENDOSCOPY;  Service: Endoscopy;  Laterality: N/A;   CORONARY ANGIOPLASTY     CORONARY ARTERY BYPASS GRAFT     IMPLANTABLE CARDIOVERTER DEFIBRILLATOR GENERATOR CHANGE N/A 05/05/2013   Procedure: IMPLANTABLE CARDIOVERTER DEFIBRILLATOR GENERATOR CHANGE;  Surgeon: Duke Salvia, MD;  Location: [facility] CATH LAB;  Service: Cardiovascular;  Laterality: N/A;   INSERT / REPLACE / REMOVE PACEMAKER     RIGHT/LEFT HEART CATH AND CORONARY ANGIOGRAPHY N/A 03/01/2021   Procedure: RIGHT/LEFT HEART CATH  AND CORONARY ANGIOGRAPHY;  Surgeon: Tonny Bollman, MD;  Location: [facility] INVASIVE CV LAB;  Service: Cardiovascular;  Laterality: N/A;   TEE WITHOUT CARDIOVERSION N/A 04/27/2021   Procedure: TRANSESOPHAGEAL ECHOCARDIOGRAM (TEE);  Surgeon: Alleen Borne, MD;  Location: [facility] OR;  Service: Open Heart Surgery;  Laterality: N/A;   VSD REPAIR      Family History  Problem Relation Age of Onset   Heart disease Father    Hypertension Maternal Grandmother    Cancer Maternal Grandmother    Hypertension Maternal Grandfather    Cancer Maternal Grandfather     Social History   Socioeconomic History   Marital status: Married    Spouse name: [name]   Number of children: 2   Years of education: 17   Highest education level: Bachelor's degree (e.g., BA, AB, BS)  Occupational History   Occupation: IT    Employer: [employer]  Tobacco Use   Smoking status: Never    Passive exposure: Past   Smokeless tobacco: Never  Vaping Use   Vaping Use: Never used  Substance and Sexual Activity   Alcohol use: No   Drug use: No   Sexual activity: Yes    Partners: Female  Other Topics Concern   Not on file  Social History Narrative   Has living will   Wife is health care POA--alternate is sister [name]   Would accept resuscitation    No feeding tube if persistent cognitively unaware   Social Determinants of Health   Financial Resource Strain: Low Risk  (02/17/2020)   Overall Financial Resource Strain (CARDIA)    Difficulty of Paying Living Expenses: Not hard at all  Food Insecurity: No Food Insecurity (02/17/2020)   Hunger Vital Sign    Worried About Running Out of Food in the Last Year: Never true    Ran Out of Food in the Last Year: Never true  Transportation Needs: No Transportation Needs (02/17/2020)   PRAPARE - Administrator, Civil Service (Medical): No    Lack of Transportation (Non-Medical): No  Physical Activity: Inactive (02/17/2020)   Exercise Vital Sign    Days of  Exercise per Week: 0 days    Minutes of Exercise per Session: 0 min  Stress: No Stress Concern Present (02/17/2020)   [employer] of Occupational Health - Occupational Stress Questionnaire    Feeling of Stress : Not at all  Social Connections: Moderately Integrated (02/17/2020)   Social Connection and Isolation Panel [NHANES]    Frequency of Communication with Friends and Family: Twice a week    Frequency of Social Gatherings with Friends and Family: Twice a week    Attends Religious Services: 1 to 4 times per year    Active Member of [employer] or Organizations: No    Attends Club or  Organization Meetings: Never    Marital Status: Married  Catering manager Violence: Not At Risk (02/17/2020)   Humiliation, Afraid, Rape, and Kick questionnaire    Fear of Current or Ex-Partner: No    Emotionally Abused: No    Physically Abused: No    Sexually Abused: No   Review of Systems No rash No abdominal pain Able to eat No headache COVID test at home negative      Objective:   Physical Exam Constitutional:      Appearance: Normal appearance.  HENT:     Head:     Comments: No sinus tenderness    Left Ear: Tympanic membrane and ear canal normal.     Ears:     Comments: Cerumen on right    Mouth/Throat:     Comments: Pharyngeal and uvula injection without enlarged tonsils or exudates Pulmonary:     Effort: Pulmonary effort is normal.     Breath sounds: Normal breath sounds. No wheezing or rales.  Musculoskeletal:     Cervical back: Neck supple.  Lymphadenopathy:     Cervical: No cervical adenopathy.  Skin:    Findings: No rash.  Neurological:     Mental Status: He is alert.            Assessment & Plan:

## 2022-11-06 NOTE — Assessment & Plan Note (Addendum)
Almost certainly viral ---but will check strep since that is the only thing that would require Rx Discussed tylenol (and other supportive things like gargles, etc)  Strep negative Supportive care RTW 6/20

## 2022-11-08 ENCOUNTER — Ambulatory Visit (INDEPENDENT_AMBULATORY_CARE_PROVIDER_SITE_OTHER): Payer: 59

## 2022-11-08 DIAGNOSIS — I472 Ventricular tachycardia, unspecified: Secondary | ICD-10-CM | POA: Diagnosis not present

## 2022-11-08 LAB — CUP PACEART REMOTE DEVICE CHECK
Battery Remaining Longevity: 48 mo
Battery Remaining Percentage: 52 %
Brady Statistic RV Percent Paced: 0 %
Date Time Interrogation Session: 20240620032700
HighPow Impedance: 59 Ohm
Implantable Lead Connection Status: 753985
Implantable Lead Implant Date: 20080214
Implantable Lead Location: 753860
Implantable Lead Model: 185
Implantable Lead Serial Number: 178017
Implantable Pulse Generator Implant Date: 20141216
Lead Channel Impedance Value: 659 Ohm
Lead Channel Pacing Threshold Amplitude: 1 V
Lead Channel Pacing Threshold Pulse Width: 0.4 ms
Lead Channel Setting Pacing Amplitude: 2 V
Lead Channel Setting Pacing Pulse Width: 0.4 ms
Lead Channel Setting Sensing Sensitivity: 0.6 mV
Pulse Gen Serial Number: 126781

## 2022-11-12 ENCOUNTER — Other Ambulatory Visit: Payer: Self-pay | Admitting: Cardiovascular Disease

## 2022-11-12 NOTE — Telephone Encounter (Signed)
Called patient and spoke with him to remind him to get BMP drawn at [facility]. Patient states he will have it done on Wednesday, November 14, 2022. Patient expressed gratitude for the call.

## 2022-11-14 ENCOUNTER — Other Ambulatory Visit
Admission: RE | Admit: 2022-11-14 | Discharge: 2022-11-14 | Disposition: A | Discharge status: Home / Self Care | Payer: 59 | Attending: Cardiology | Admitting: Cardiology

## 2022-11-14 DIAGNOSIS — I5020 Unspecified systolic (congestive) heart failure: Secondary | ICD-10-CM | POA: Diagnosis present

## 2022-11-14 DIAGNOSIS — I472 Ventricular tachycardia, unspecified: Secondary | ICD-10-CM | POA: Insufficient documentation

## 2022-11-14 LAB — BASIC METABOLIC PANEL
Anion gap: 6 (ref 5–15)
BUN: 18 mg/dL (ref 6–20)
CO2: 25 mmol/L (ref 22–32)
Calcium: 9.3 mg/dL (ref 8.9–10.3)
Chloride: 105 mmol/L (ref 98–111)
Creatinine, Ser: 1.12 mg/dL (ref 0.61–1.24)
GFR, Estimated: >60 mL/min (ref >60)
Glucose, Bld: 98 mg/dL (ref 70–99)
Potassium: 4.3 mmol/L (ref 3.5–5.1)
Sodium: 136 mmol/L (ref 135–145)

## 2022-11-21 ENCOUNTER — Ambulatory Visit: Payer: 59 | Attending: Cardiovascular Disease

## 2022-11-21 DIAGNOSIS — Z7901 Long term (current) use of anticoagulants: Secondary | ICD-10-CM

## 2022-11-21 DIAGNOSIS — Z952 Presence of prosthetic heart valve: Secondary | ICD-10-CM | POA: Diagnosis not present

## 2022-11-21 DIAGNOSIS — I350 Nonrheumatic aortic (valve) stenosis: Secondary | ICD-10-CM | POA: Diagnosis not present

## 2022-11-21 LAB — POCT INR: INR: 1.9 — AB (ref 2.0–3.0)

## 2022-11-21 NOTE — Patient Instructions (Signed)
Continue taking warfarin 1.5 tablets daily. Recheck INR in 6 weeks.  Be consistent with green (2 times per week)  [facility] [phone] 

## 2022-11-28 ENCOUNTER — Ambulatory Visit (INDEPENDENT_AMBULATORY_CARE_PROVIDER_SITE_OTHER): Payer: 59 | Admitting: Internal Medicine

## 2022-11-28 ENCOUNTER — Encounter: Payer: Self-pay | Admitting: Internal Medicine

## 2022-11-28 VITALS — BP 118/74 | HR 72 | Temp 97.3°F | Ht 69.25 in | Wt 212.0 lb

## 2022-11-28 DIAGNOSIS — Z0000 Encounter for general adult medical examination without abnormal findings: Secondary | ICD-10-CM | POA: Diagnosis not present

## 2022-11-28 DIAGNOSIS — K648 Other hemorrhoids: Secondary | ICD-10-CM

## 2022-11-28 DIAGNOSIS — R200 Anesthesia of skin: Secondary | ICD-10-CM | POA: Diagnosis not present

## 2022-11-28 DIAGNOSIS — Z952 Presence of prosthetic heart valve: Secondary | ICD-10-CM | POA: Diagnosis not present

## 2022-11-28 DIAGNOSIS — I5020 Unspecified systolic (congestive) heart failure: Secondary | ICD-10-CM

## 2022-11-28 DIAGNOSIS — R202 Paresthesia of skin: Secondary | ICD-10-CM

## 2022-11-28 NOTE — Assessment & Plan Note (Signed)
Warfarin managed by cardiology

## 2022-11-28 NOTE — Progress Notes (Signed)
Remote ICD transmission.   

## 2022-11-28 NOTE — Progress Notes (Signed)
Subjective:    Patient ID: [name], male    DOB: [birth year], 47 y.o.   MRN: [id number]  HPI Here for physical  Doing okay in general Has noticed some tingling in left arm to hand---since recent cold Notices mostly in the morning--when he stands up. Most prominent in hand No weakness Will go away in 3-5 minutes No other neurologic symptoms  Still with severe hemorrhoid problems Bleed at times  Heart okay No new problems there  Current Outpatient Medications on File Prior to Visit  Medication Sig Dispense Refill   acetaminophen (TYLENOL) 500 MG tablet Take 500 mg by mouth every 6 (six) hours as needed for moderate pain.     amoxicillin (AMOXIL) 500 MG capsule Take 4 capsules by mouth one hour prior to dental exam 12 capsule 3   aspirin EC 81 MG EC tablet Take 1 tablet (81 mg total) by mouth daily. Swallow whole. 30 tablet 11   carvedilol (COREG) 6.25 MG tablet Take 1 tablet (6.25 mg total) by mouth 2 (two) times daily. 180 tablet 3   empagliflozin (JARDIANCE) 10 MG TABS tablet Take 1 tablet (10 mg total) by mouth daily before breakfast. 30 tablet 6   loratadine (CLARITIN) 10 MG tablet Take by mouth as needed.     magnesium oxide (MAG-OX) 400 MG tablet Take 1 tablet (400 mg total) by mouth 2 (two) times daily. 180 tablet 1   meclizine (ANTIVERT) 25 MG tablet Take 0.5-1 tablets (12.5-25 mg total) by mouth 3 (three) times daily as needed for dizziness. 30 tablet 2   Multiple Vitamin (MULTIVITAMIN) tablet Take 2 tablets by mouth daily.     ondansetron (ZOFRAN-ODT) 4 MG disintegrating tablet Take by mouth as needed.     ranolazine (RANEXA) 1000 MG SR tablet TAKE 1 TABLET TWICE A DAY 180 tablet 1   sacubitril-valsartan (ENTRESTO) 24-26 MG Take 1 tablet by mouth 2 (two) times daily. 180 tablet 0   spironolactone (ALDACTONE) 25 MG tablet Take 1 tablet (25 mg total) by mouth daily. 90 tablet 0   warfarin (COUMADIN) 5 MG tablet TAKE ONE AND ONE-HALF TABLETS DAILY OR AS DIRECTED BY THE  COUMADIN CLINIC 150 tablet 3   No current facility-administered medications on file prior to visit.    Allergies  Allergen Reactions   Shellfish Allergy Nausea And Vomiting    Past Medical History:  Diagnosis Date   AICD (automatic cardioverter/defibrillator) present    Anxiety    Aortic valve replaced    Requiring replacement, specifics not available   Complication of anesthesia    N & V   Dysrhythmia    Hearing loss of both ears 12/22/2015   Heart murmur    Hypertension    IBD (inflammatory bowel disease) 09/23/2015   ICD (implantable cardiac defibrillator), BSX single    Myocardial infarction [facility])    Obesity 12/22/2015   PONV (postoperative nausea and vomiting)    Secondary cardiomyopathy (HCC)    Syncope    Ventricular septal defect    Ventricular tachycardia (HCC)    appropriate VT shock therapy /13    Past Surgical History:  Procedure Laterality Date   ANGIOPLASTY     RCA repair with vein angioplasty following injury with the aforementioned surgery   AORTIC VALVE REPLACEMENT     Bioprosthesis   AORTIC VALVE REPLACEMENT N/A 04/27/2021   Procedure: REDO AORTIC VALVE REPLACEMENT (AVR) USING ON-X AORTIC VALVE;  Surgeon: Alleen Borne, MD;  Location: [facility] OR;  Service:  Open Heart Surgery;  Laterality: N/A;   CARDIAC CATHETERIZATION     CARDIAC DEFIBRILLATOR PLACEMENT     CARDIAC VALVE REPLACEMENT     CHOLECYSTECTOMY     COLONOSCOPY WITH PROPOFOL N/A 12/06/2015   Procedure: COLONOSCOPY WITH PROPOFOL;  Surgeon: Midge Minium, MD;  Location: [facility] ENDOSCOPY;  Service: Endoscopy;  Laterality: N/A;   CORONARY ANGIOPLASTY     CORONARY ARTERY BYPASS GRAFT     IMPLANTABLE CARDIOVERTER DEFIBRILLATOR GENERATOR CHANGE N/A 05/05/2013   Procedure: IMPLANTABLE CARDIOVERTER DEFIBRILLATOR GENERATOR CHANGE;  Surgeon: Duke Salvia, MD;  Location: [facility] CATH LAB;  Service: Cardiovascular;  Laterality: N/A;   INSERT / REPLACE / REMOVE PACEMAKER     RIGHT/LEFT HEART CATH AND  CORONARY ANGIOGRAPHY N/A 03/01/2021   Procedure: RIGHT/LEFT HEART CATH AND CORONARY ANGIOGRAPHY;  Surgeon: Tonny Bollman, MD;  Location: [facility] INVASIVE CV LAB;  Service: Cardiovascular;  Laterality: N/A;   TEE WITHOUT CARDIOVERSION N/A 04/27/2021   Procedure: TRANSESOPHAGEAL ECHOCARDIOGRAM (TEE);  Surgeon: Alleen Borne, MD;  Location: [facility] OR;  Service: Open Heart Surgery;  Laterality: N/A;   VSD REPAIR      Family History  Problem Relation Age of Onset   Heart disease Father    Hypertension Maternal Grandmother    Cancer Maternal Grandmother    Hypertension Maternal Grandfather    Cancer Maternal Grandfather     Social History   Socioeconomic History   Marital status: Married    Spouse name: [name]   Number of children: 2   Years of education: 17   Highest education level: Bachelor's degree (e.g., BA, AB, BS)  Occupational History   Occupation: IT    Employer: [employer]  Tobacco Use   Smoking status: Never    Passive exposure: Past   Smokeless tobacco: Never  Vaping Use   Vaping Use: Never used  Substance and Sexual Activity   Alcohol use: No   Drug use: No   Sexual activity: Yes    Partners: Female  Other Topics Concern   Not on file  Social History Narrative   Has living will   Wife is health care POA--alternate is sister [name]   Would accept resuscitation    No feeding tube if persistent cognitively unaware   Social Determinants of Health   Financial Resource Strain: Low Risk  (02/17/2020)   Overall Financial Resource Strain (CARDIA)    Difficulty of Paying Living Expenses: Not hard at all  Food Insecurity: No Food Insecurity (02/17/2020)   Hunger Vital Sign    Worried About Running Out of Food in the Last Year: Never true    Ran Out of Food in the Last Year: Never true  Transportation Needs: No Transportation Needs (02/17/2020)   PRAPARE - Administrator, Civil Service (Medical): No    Lack of Transportation (Non-Medical): No  Physical  Activity: Inactive (02/17/2020)   Exercise Vital Sign    Days of Exercise per Week: 0 days    Minutes of Exercise per Session: 0 min  Stress: No Stress Concern Present (02/17/2020)   [employer] of Occupational Health - Occupational Stress Questionnaire    Feeling of Stress : Not at all  Social Connections: Moderately Integrated (02/17/2020)   Social Connection and Isolation Panel [NHANES]    Frequency of Communication with Friends and Family: Twice a week    Frequency of Social Gatherings with Friends and Family: Twice a week    Attends Religious Services: 1 to 4 times per year  Active Member of Clubs or Organizations: No    Attends Banker Meetings: Never    Marital Status: Married  Catering manager Violence: Not At Risk (02/17/2020)   Humiliation, Afraid, Rape, and Kick questionnaire    Fear of Current or Ex-Partner: No    Emotionally Abused: No    Physically Abused: No    Sexually Abused: No   Review of Systems  Constitutional:  Negative for fatigue and unexpected weight change.       Tries to walk 2 miles daily Wears seat belt  HENT:  Positive for hearing loss and tinnitus. Negative for dental problem and trouble swallowing.        Sees dentist regularly  Eyes:        Some decline in vision even with glasses  Respiratory:  Negative for cough, chest tightness and shortness of breath.   Cardiovascular:  Negative for chest pain, palpitations and leg swelling.  Gastrointestinal:  Positive for blood in stool. Negative for constipation.       Some gas pains No heartburn  Endocrine: Negative for polydipsia and polyuria.  Genitourinary:        More nocturia---3 times Mild sexual problems  Musculoskeletal:  Negative for back pain and joint swelling.       Just some mild aches and pains  Skin:  Negative for rash.  Allergic/Immunologic: Negative for environmental allergies and immunocompromised state.  Neurological:  Negative for dizziness, syncope,  light-headedness and headaches.  Hematological:  Negative for adenopathy. Does not bruise/bleed easily.  Psychiatric/Behavioral:  Negative for dysphoric mood and sleep disturbance. The patient is not nervous/anxious.        Objective:   Physical Exam Constitutional:      Appearance: Normal appearance.  HENT:     Mouth/Throat:     Pharynx: No oropharyngeal exudate or posterior oropharyngeal erythema.  Eyes:     Conjunctiva/sclera: Conjunctivae normal.     Pupils: Pupils are equal, round, and reactive to light.  Cardiovascular:     Rate and Rhythm: Normal rate and regular rhythm.     Pulses: Normal pulses.     Heart sounds: No murmur heard.    No gallop.     Comments: Valve click Pulmonary:     Effort: Pulmonary effort is normal.     Breath sounds: Normal breath sounds. No wheezing or rales.  Abdominal:     Palpations: Abdomen is soft.     Tenderness: There is no abdominal tenderness.  Musculoskeletal:     Cervical back: Neck supple.     Right lower leg: No edema.     Left lower leg: No edema.  Lymphadenopathy:     Cervical: No cervical adenopathy.  Skin:    Findings: No lesion or rash.  Neurological:     General: No focal deficit present.     Mental Status: He is alert and oriented to person, place, and time.  Psychiatric:        Mood and Affect: Mood normal.        Behavior: Behavior normal.            Assessment & Plan:

## 2022-11-28 NOTE — Assessment & Plan Note (Signed)
Healthy Does exercise Recent PSA Colon due 2027 Prefers no flu or COVID vaccines

## 2022-11-28 NOTE — Assessment & Plan Note (Signed)
Wants to consider banding Will refer to GI

## 2022-11-28 NOTE — Assessment & Plan Note (Signed)
Median nerve distribution If worsens can use wrist splint or consider referral to hand surgeon

## 2022-11-28 NOTE — Assessment & Plan Note (Signed)
Compensated with entresto, carvedilol, jardiance, aldactone Follows with cardiology

## 2022-11-29 ENCOUNTER — Other Ambulatory Visit: Payer: Self-pay | Admitting: Cardiovascular Disease

## 2022-11-29 NOTE — Telephone Encounter (Signed)
Prescribed by Dr. Excell Seltzer. Please review for refill. Thank you!

## 2022-12-03 ENCOUNTER — Other Ambulatory Visit: Payer: Self-pay | Admitting: Cardiovascular Disease

## 2022-12-04 NOTE — Telephone Encounter (Signed)
Medication sent to pharmacy on file. (It's OTC actually, but perhaps his insurance will pay for it if RX is sent in and last mag level was still low)

## 2022-12-04 NOTE — Telephone Encounter (Signed)
 This is a [place] pt 

## 2022-12-04 NOTE — Telephone Encounter (Signed)
General card. Dr. Excell Seltzer pt./[place]

## 2022-12-04 NOTE — Telephone Encounter (Signed)
Pt's pharmacy is requesting a refill on magnesium oxide. Would Dr. Excell Seltzer like to refill this medication? Please address

## 2022-12-05 ENCOUNTER — Ambulatory Visit: Payer: 59 | Attending: Cardiology

## 2022-12-05 DIAGNOSIS — Z953 Presence of xenogenic heart valve: Secondary | ICD-10-CM

## 2022-12-05 LAB — ECHOCARDIOGRAM COMPLETE
AR max vel: 2.2 cm2
AV Area VTI: 2.25 cm2
AV Area mean vel: 2.18 cm2
AV Mean grad: 6.2 mmHg
AV Peak grad: 11.8 mmHg
Ao pk vel: 1.72 m/s
Area-P 1/2: 4.89 cm2
Calc EF: 38.9 %
S' Lateral: 5.5 cm
Single Plane A2C EF: 35.9 %
Single Plane A4C EF: 39.8 %

## 2022-12-07 IMAGING — CT CT ANGIO CHEST
2 of 7 series · 15 of 36 positions shown · IV contrast (APPLIED)
Comparison: 12/31/2016 and previous

CLINICAL DATA: Aortic stenosis, preop evaluation

EXAM:
CT ANGIOGRAPHY CHEST, ABDOMEN AND PELVIS
TECHNIQUE: Multidetector CT imaging through the chest, abdomen and pelvis was
performed using the standard protocol during bolus administration of
intravenous contrast. Multiplanar reconstructed images and MIPs were
obtained and reviewed to evaluate the vascular anatomy.
CONTRAST:  100mL OMNIPAQUE IOHEXOL 350 MG/ML SOLN

[Series 3: ax thins · axial · 0.59mm/px · z∈[+848,+1490]mm · 14 of 743 slices shown]
[im 50/743  lung]
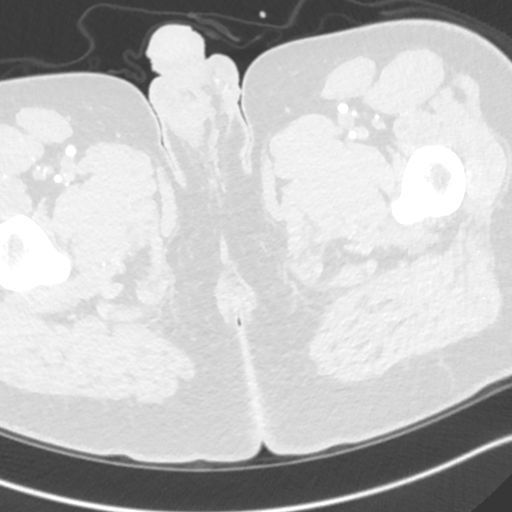
[im 99/743  mediastinal]
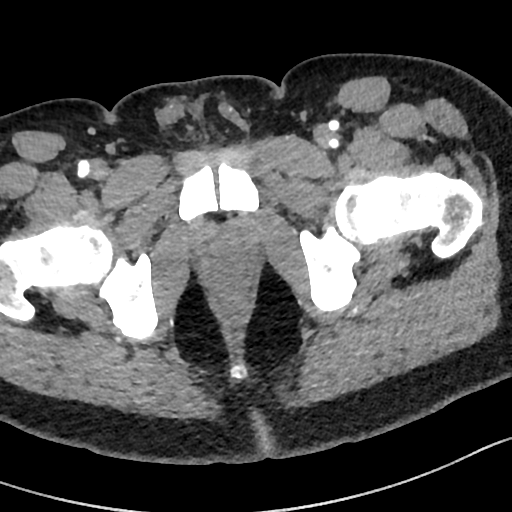
[im 149/743  lung]
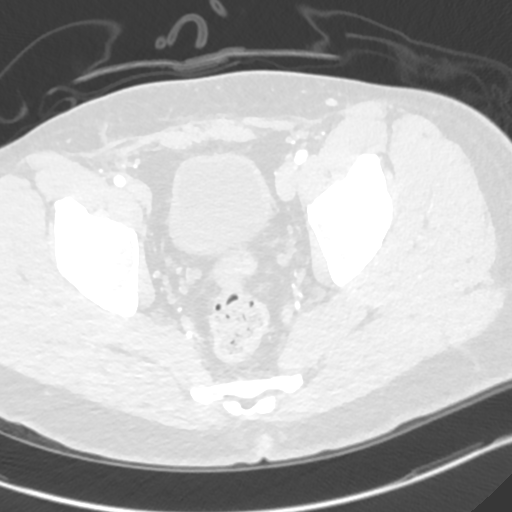
[im 198/743  mediastinal]
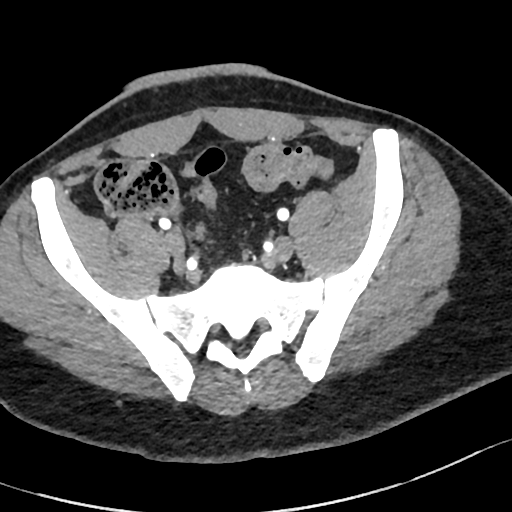
[im 248/743  lung]
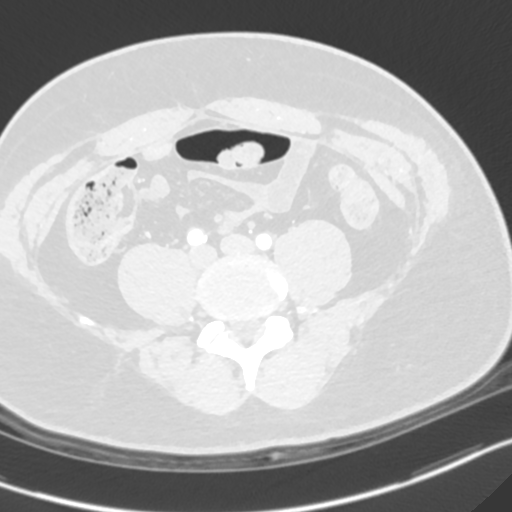
[im 297/743  mediastinal]
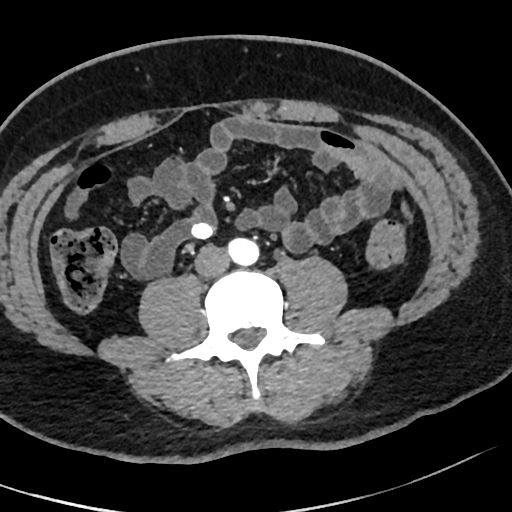
[im 347/743  lung]
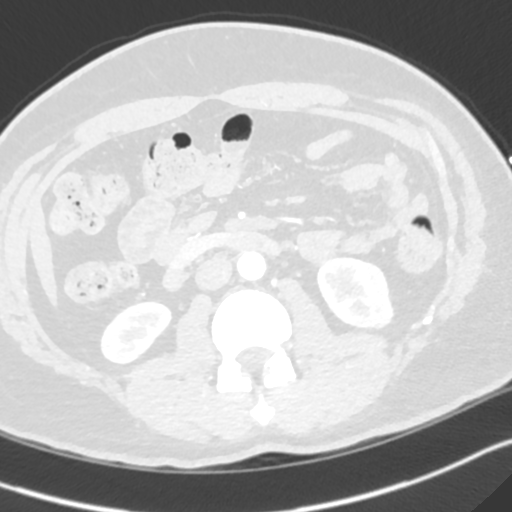
[im 396/743  mediastinal]
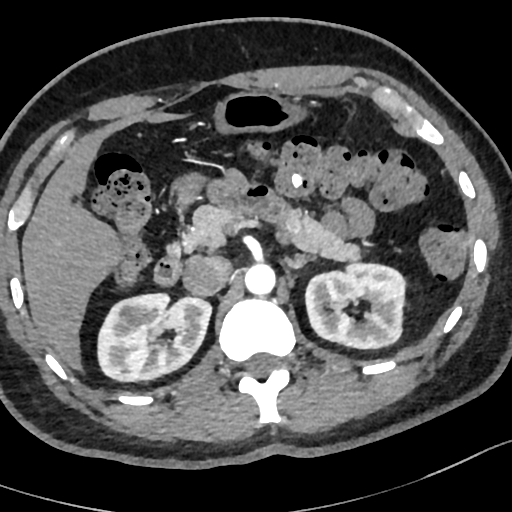
[im 446/743  lung]
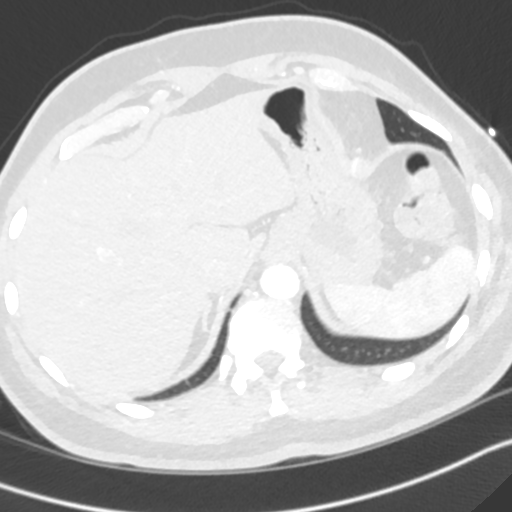
[im 495/743  mediastinal]
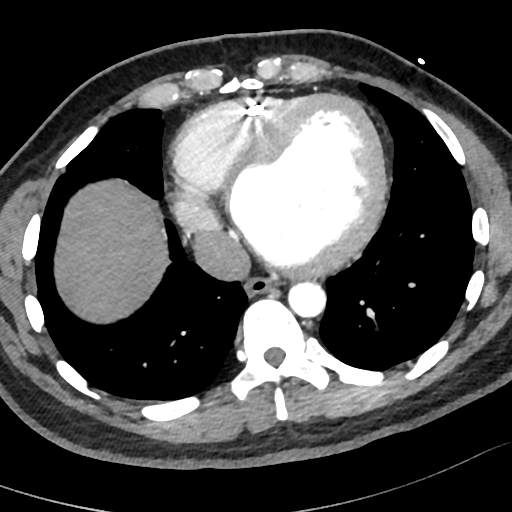
[im 545/743  lung]
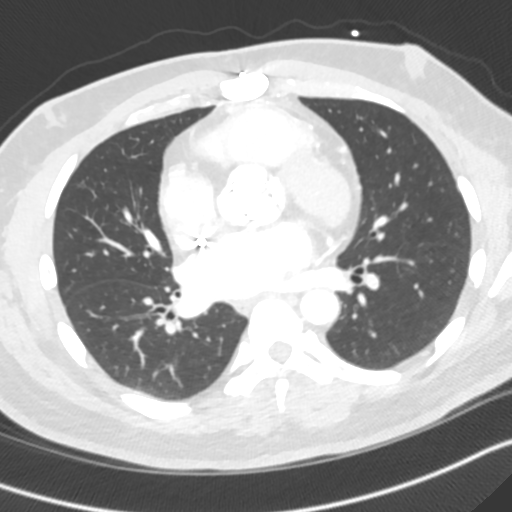
[im 594/743  mediastinal]
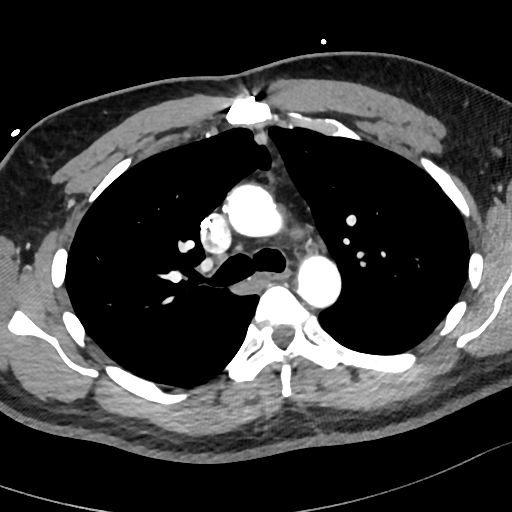
[im 644/743  lung]
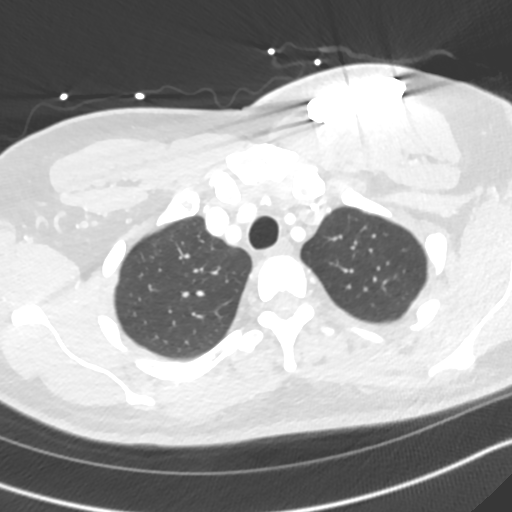
[im 693/743  mediastinal]
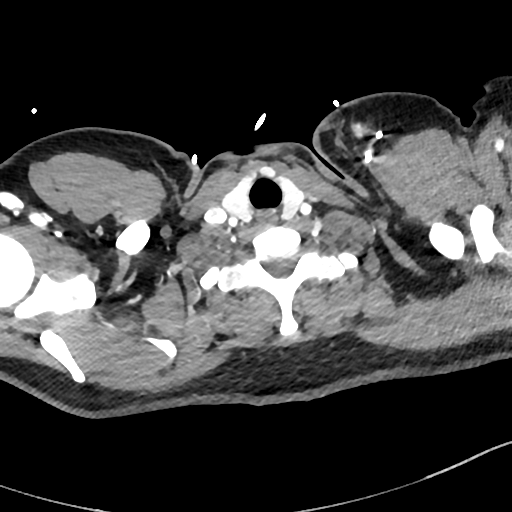

[Series 6: cor · coronal · 0.90mm/px · 1 of 133 slices shown]
[im 67/133  mediastinal]
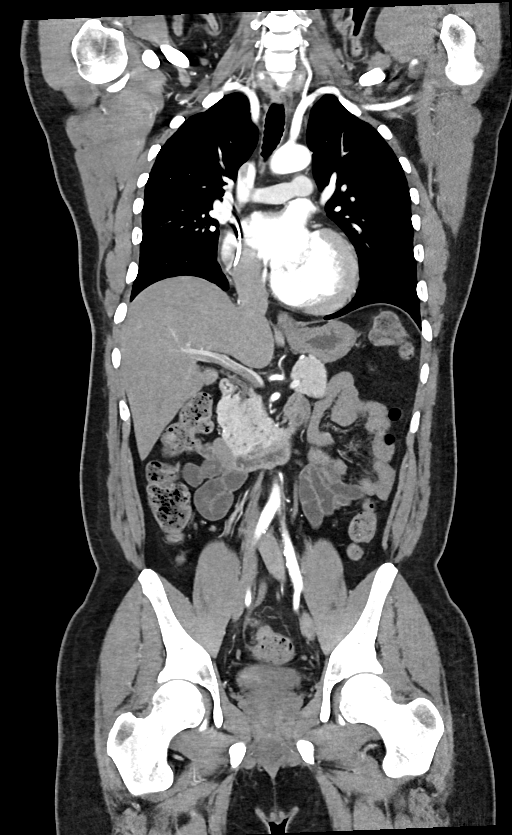

[15 of 36 positions shown; findings below may reference images not displayed]

FINDINGS: CTA CHEST FINDINGS

Cardiovascular: Left subclavian single lead AICD extends to the RV
apex. There is mild primary left-sided cardiomegaly. No pericardial
effusion. Satisfactory opacification of pulmonary arteries noted,
and there is no evidence of pulmonary emboli. Left ventricular
dilatation. Previous AVR. Good contrast opacification of the
thoracic aorta. No dissection, aneurysm or stenosis. Bovine variant
brachiocephalic arterial origin anatomy without proximal stenosis.

Mediastinum/Nodes: No mass or adenopathy.

Lungs/Pleura: No pleural effusion. Stable linear scarring or
subsegmental atelectasis laterally in the right middle lobe. Lungs
otherwise clear. No pneumothorax.

Musculoskeletal: Sternotomy wires. No fracture or worrisome bone
lesion.

Review of the MIP images confirms the above findings.

CTA ABDOMEN AND PELVIS FINDINGS

VASCULAR

Aorta: Normal caliber aorta without aneurysm, dissection, vasculitis
or significant stenosis.

Celiac: Patent without evidence of aneurysm, dissection, vasculitis
or significant stenosis.

SMA: Patent without evidence of aneurysm, dissection, vasculitis or
significant stenosis.

Renals: Both renal arteries are patent without evidence of aneurysm,
dissection, vasculitis, fibromuscular dysplasia or significant
stenosis.

IMA: Patent without evidence of aneurysm, dissection, vasculitis or
significant stenosis.

Inflow: Mild calcified plaque in the common iliac arteries. No
aneurysm, dissection, stenosis, or significant tortuosity.

Veins: No obvious venous abnormality within the limitations of this
arterial phase study.

Review of the MIP images confirms the above findings.

NON-VASCULAR

Hepatobiliary: No focal liver abnormality is seen. Status post
cholecystectomy. No biliary dilatation.

Pancreas: Unremarkable. No pancreatic ductal dilatation or
surrounding inflammatory changes.

Spleen: Normal in size without focal abnormality.

Adrenals/Urinary Tract: 1.8 cm right upper pole renal cyst, present
since 09/23/2015. No urolithiasis or hydronephrosis. Adrenal glands
unremarkable. Urinary bladder incompletely distended, unremarkable.

Stomach/Bowel: Small hiatal hernia. Stomach is incompletely
distended, unremarkable. The small bowel is decompressed. Normal
appendix. The colon is nondilated , unremarkable.

Lymphatic: No abdominal or pelvic adenopathy.

Reproductive: Prostate enlargement

Other: No ascites.  No free air.

Musculoskeletal: No acute or significant osseous findings.

Review of the MIP images confirms the above findings.
IMPRESSION: 1. No aortic aneurysm, dissection, or stenosis.
2. Mild atheromatous plaque in common iliac arteries without
significant stenosis.

## 2022-12-07 IMAGING — CT CT HEART MORP W/ CTA COR W/ SCORE W/ CA W/CM &/OR W/O CM
1 series · 13 of 20 positions shown, 17 images · non-contrast
Comparison: None.
COMPARISON: None.

Addendum:
EXAM:
OVER-READ INTERPRETATION  CT CHEST

The following report is an over-read performed by radiologist Dr.
Iamlambda Lormand [REDACTED] on 03/19/2021. This
over-read does not include interpretation of cardiac or coronary
anatomy or pathology. The coronary calcium score/coronary CTA
interpretation by the cardiologist is attached.
CLINICAL DATA: Prosthetic valve stenosis. 25 mm pericardial tissue
valve 07/09/2001 at [REDACTED] ([facility] [HOSPITAL]).
Cardiac TAVR CT
TECHNIQUE: A non-contrast, gated CT scan was obtained with axial slices of 3 mm
through the heart for aortic valve calcium scoring. A 100 kV
retrospective, gated, contrast cardiac scan was obtained. Gantry
rotation speed was 250 msecs and collimation was 0.6 mm.
Nitroglycerin was not given. The 3D data set was reconstructed in 5%
intervals of the 0-95% of the R-R cycle. Systolic and diastolic
phases were analyzed on a dedicated workstation using MPR, MIP, and
VRT modes. The patient received 100 cc of contrast.

[Series 3280: findings · 0.35mm/px · 13 of 36 slices shown, 17 images]
[im 2/36  vessel]
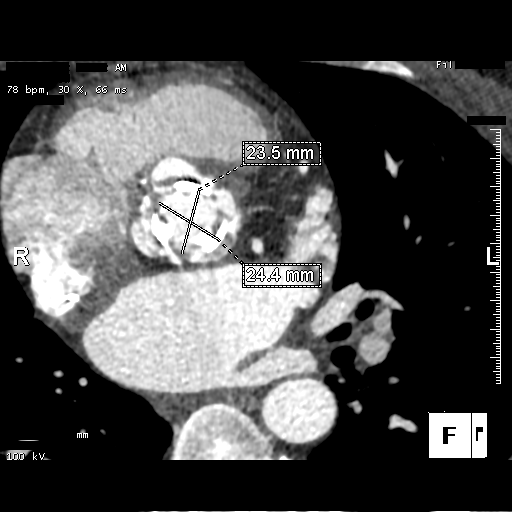
[im 2/36  lung]
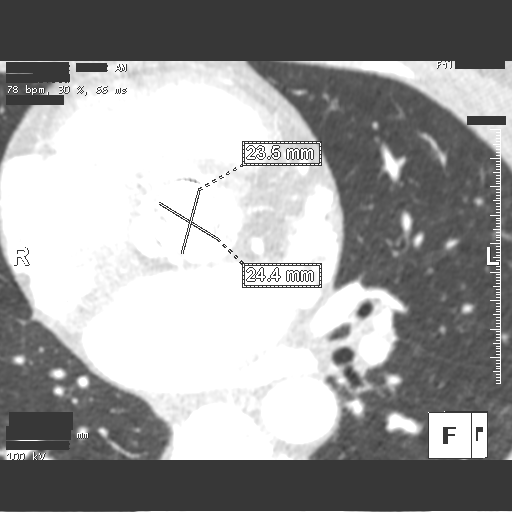
[im 4/36  vessel]
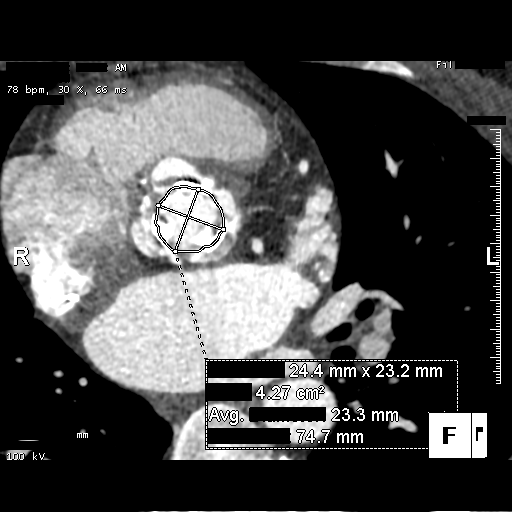
[im 6/36  vessel]
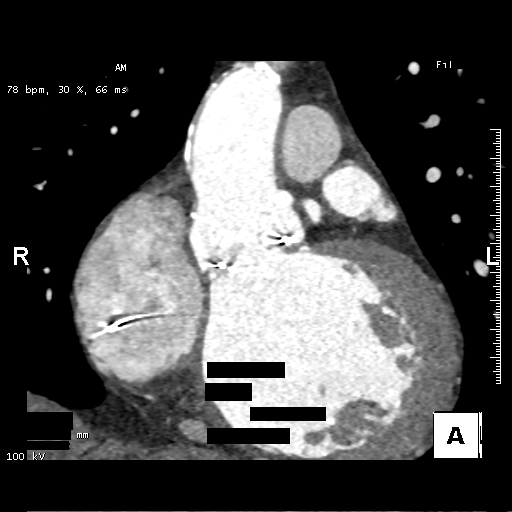
[im 17/36  vessel]
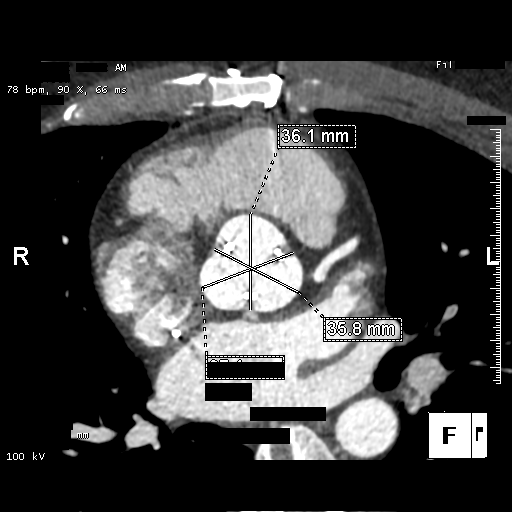
[im 19/36  vessel]
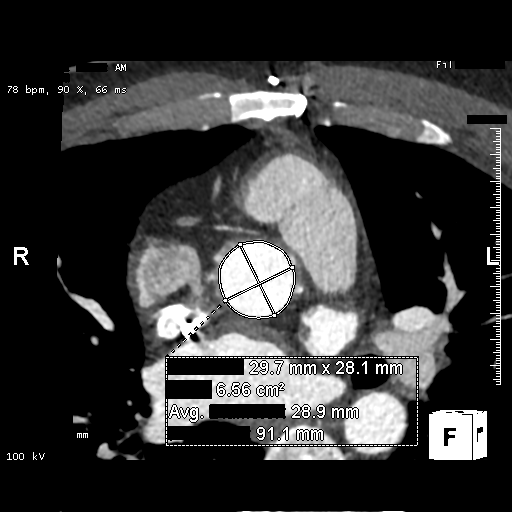
[im 19/36  lung]
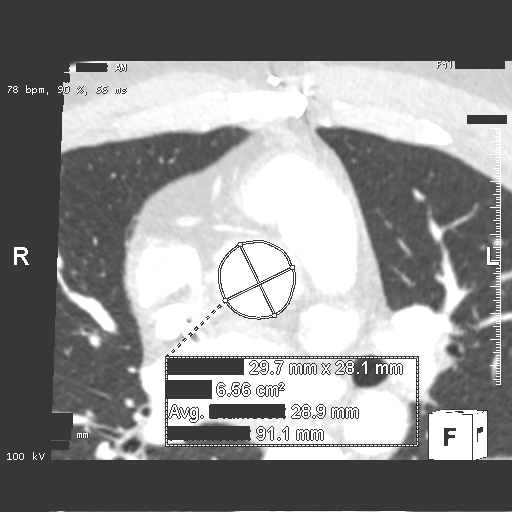
[im 21/36  vessel]
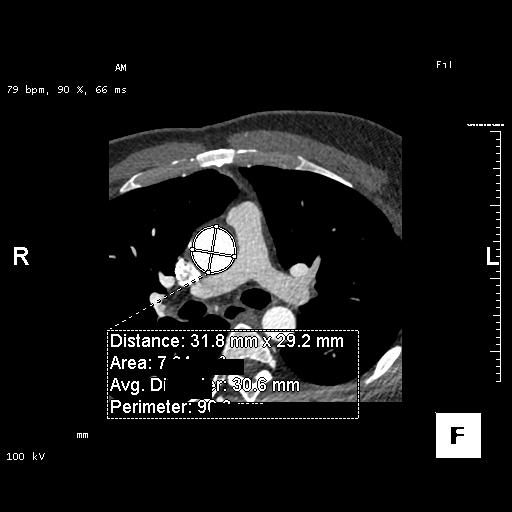
[im 23/36  vessel]
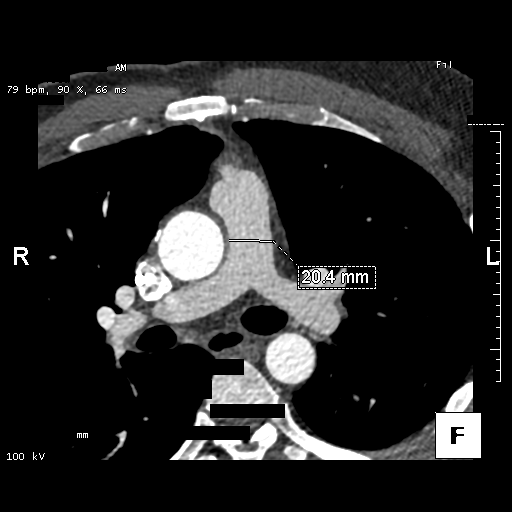
[im 24/36  vessel]
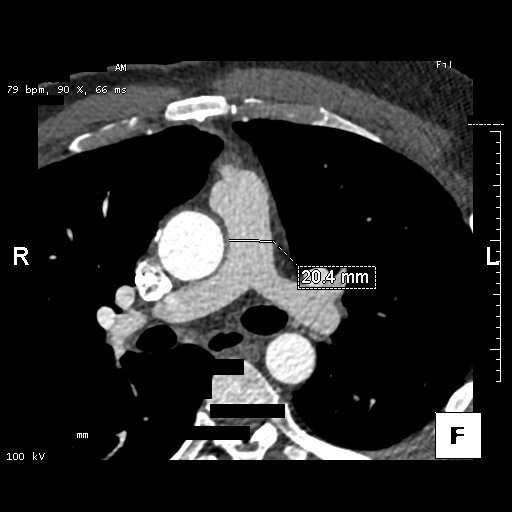
[im 26/36  vessel]
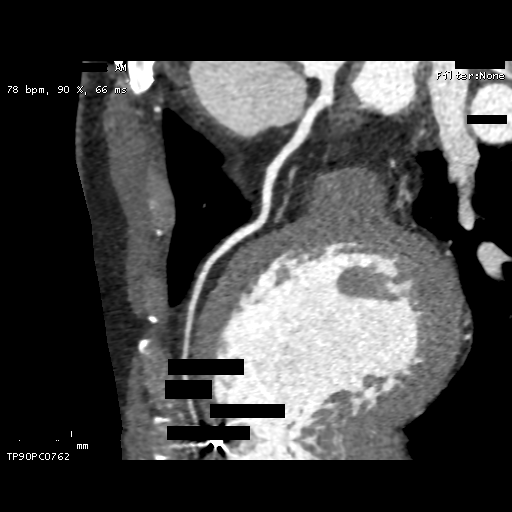
[im 26/36  lung]
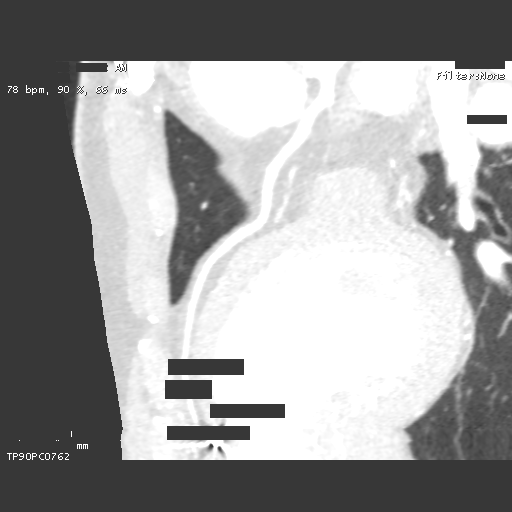
[im 28/36  vessel]
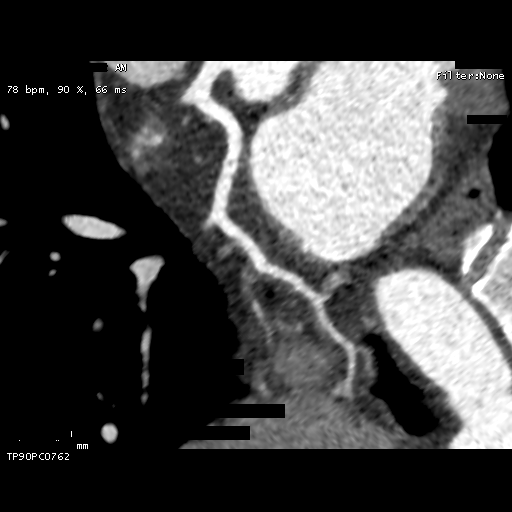
[im 30/36  vessel]
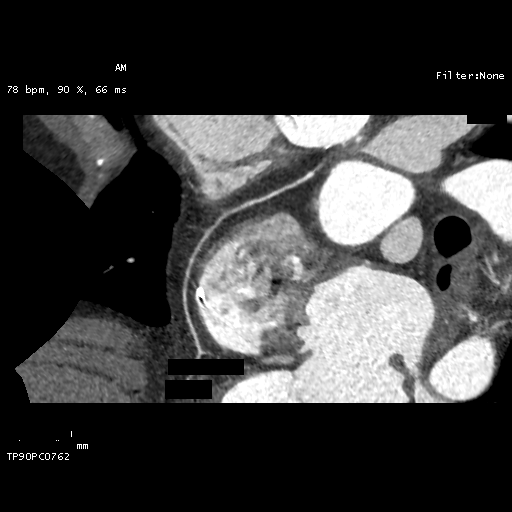
[im 32/36  vessel]
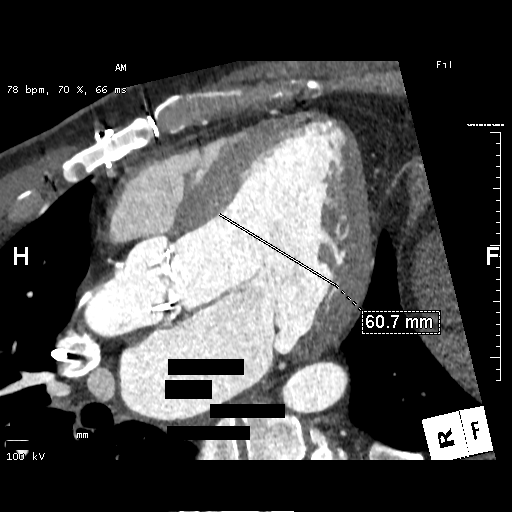
[im 34/36  vessel]
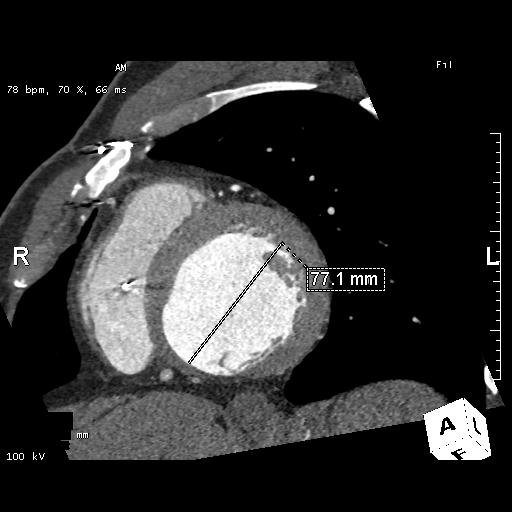
[im 34/36  lung]
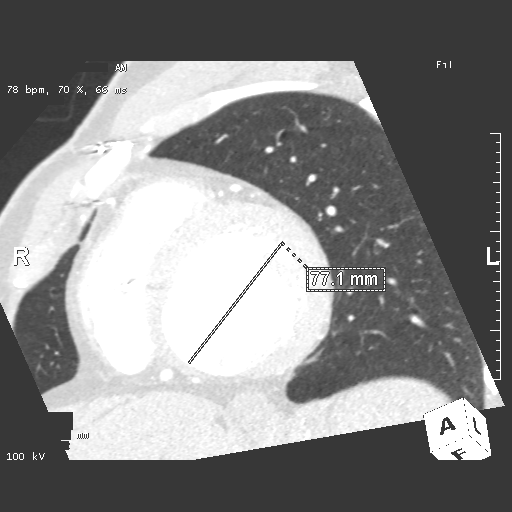

[13 of 20 positions shown; findings below may reference images not displayed]

FINDINGS: Extracardiac findings were described separately under dictation for
contemporaneously obtained CTA chest, abdomen and pelvis.
IMPRESSION: Please see separate dictation for contemporaneously obtained CTA
chest, abdomen and pelvis 03/17/2021 for full description of
relevant extracardiac findings.
FINDINGS: Image quality: Excellent.

Noise artifact is: Limited.

Aortic Valve: A 25 mm pericardial tissue valve is present in the
aortic position. Implant date 07/09/2001 ([REDACTED], [facility] [HOSP[facility]]). The
valve is likely a Perimount valve. The valve leaflets are thickened
and calcified consistent with structural valve deterioration. No
thrombus is present.

Valve in Valve Analysis: [facility] simulation performed for a 26 mm S3
TAVR performed with 80% offset into the aorta and 20% into the
ventricle. The virtual transcatheter heart valve (THV) to coronary
([facility]) distance for the left main is 5.6 mm. The ostium of the left
main appears above the THV. The RCA is occluded at the ostium as
noted in the prior operative report. The ostium is well above the
THV regardless. Overall, there is no risk for coronary obstruction
to the left main coronary artery.

Optimal coplanar projection: LAO 5 Ginger 2

Sinus of Valsalva Measurements:

Non-coronary: 37 mm

Right-coronary: 36 mm

Left-coronary: 36 mm

Sinotubular Junction: 30 mm

Ascending Thoracic Aorta: 31 mm

Coronary Arteries: Normal coronary origin. Right dominance. Occluded
RCA at the ostium. The left main, LAD, and LCX are normal.

Cardiac Morphology:

Right Atrium: Right atrial size is within normal limits.

Right Ventricle: The right ventricular cavity is within normal
limits. A single CIED lead is seen that terminates in the RV.

Left Atrium: Left atrial size is normal in size with no left atrial
appendage filling defect.

Left Ventricle: The ventricular cavity size is severely dilated.
Moderately reduced LV function, EF=32%. The basal to mid infero
septum and inferior segments are akinetic. The myocardium in this
region is thinned consistent with prior RCA infarction.

Pulmonary arteries: Normal in size without proximal filling defect.

Pulmonary veins: Normal pulmonary venous drainage.

Pericardium: Normal thickness with no significant effusion or
calcium present.

Mitral Valve: The mitral valve is normal structure without
significant calcification.

Extra-cardiac findings: See attached radiology report for
non-cardiac structures.
IMPRESSION: 1. 25 mm pericardial bioprosthesis (likely a Perimount prosthesis)
in the aortic position with calcified and thickened leaflets
consistent with structural valve deterioration.

2. Valve in valve analysis appropriate for 26 mm S3 TAVR. No risk of
coronary obstruction.

3. Optimal Fluoroscopic Angle for Delivery: LAO 5 GAUNA 2

4. Severely dilated left ventricular cavity. Moderately reduced LV
function, EF=32%. Regional wall motion abnormalities are consistent
with prior RCA infarction.

*** End of Addendum ***
EXAM:
OVER-READ INTERPRETATION  CT CHEST

The following report is an over-read performed by radiologist Dr.
MOCH ERLAND [REDACTED] on 03/19/2021. This
over-read does not include interpretation of cardiac or coronary
anatomy or pathology. The coronary calcium score/coronary CTA
interpretation by the cardiologist is attached.
FINDINGS: Extracardiac findings were described separately under dictation for
contemporaneously obtained CTA chest, abdomen and pelvis.
IMPRESSION: Please see separate dictation for contemporaneously obtained CTA
chest, abdomen and pelvis 03/17/2021 for full description of
relevant extracardiac findings.

## 2023-01-02 ENCOUNTER — Ambulatory Visit: Payer: 59 | Attending: Cardiovascular Disease

## 2023-01-02 DIAGNOSIS — Z7901 Long term (current) use of anticoagulants: Secondary | ICD-10-CM | POA: Diagnosis not present

## 2023-01-02 DIAGNOSIS — Z952 Presence of prosthetic heart valve: Secondary | ICD-10-CM

## 2023-01-02 DIAGNOSIS — I350 Nonrheumatic aortic (valve) stenosis: Secondary | ICD-10-CM

## 2023-01-02 LAB — POCT INR: INR: 1.8 — AB (ref 2.0–3.0)

## 2023-01-02 NOTE — Patient Instructions (Signed)
Continue taking warfarin 1.5 tablets daily. Recheck INR in 6 weeks.  Be consistent with green (2 times per week)  [facility] [phone] 

## 2023-01-03 ENCOUNTER — Other Ambulatory Visit: Payer: Self-pay

## 2023-01-03 ENCOUNTER — Ambulatory Visit: Payer: 59 | Admitting: Gastroenterology

## 2023-01-03 ENCOUNTER — Encounter: Payer: Self-pay | Admitting: Gastroenterology

## 2023-01-03 VITALS — BP 112/78 | HR 80 | Temp 98.3°F | Ht 70.0 in | Wt 217.4 lb

## 2023-01-03 DIAGNOSIS — K625 Hemorrhage of anus and rectum: Secondary | ICD-10-CM

## 2023-01-03 DIAGNOSIS — K648 Other hemorrhoids: Secondary | ICD-10-CM

## 2023-01-03 MED ORDER — NA SULFATE-K SULFATE-MG SULF 17.5-3.13-1.6 GM/177ML PO SOLN: 354.0000 mL | Freq: Once | ORAL | 0 refills | Status: AC

## 2023-01-03 NOTE — Patient Instructions (Signed)
Hemorrhoids Hemorrhoids are swollen veins that may form: In the butt (rectum). These are called internal hemorrhoids. Around the opening of the butt (anus). These are called external hemorrhoids. Most hemorrhoids do not cause very bad problems. They often get better with changes to your lifestyle and what you eat. What are the causes? Having trouble pooping (constipation) or watery poop (diarrhea). Pushing too hard when you poop. Pregnancy. Being very overweight (obese). Sitting for too long. Riding a bike for a long time. Heavy lifting or other things that take a lot of effort. Anal sex. What are the signs or symptoms? Pain. Itching or soreness in the butt. Bleeding from the butt. Leaking poop. Swelling. One or more lumps around the opening of your butt. How is this treated? In most cases, hemorrhoids can be treated at home. You may be told to: Change what you eat. Make changes to your lifestyle. If these treatments do not help, you may need to have a procedure done. Your doctor may need to: Place rubber bands at the bottom of the hemorrhoids to make them fall off. Put medicine into the hemorrhoids to shrink them. Shine a type of light on the hemorrhoids to cause them to fall off. Do surgery to get rid of the hemorrhoids. Follow these instructions at home: Medicines Take over-the-counter and prescription medicines only as told by your doctor. Use creams with medicine in them or medicines that you put in your butt as told by your doctor. Eating and drinking  Eat foods that have a lot of fiber in them. These include whole grains, beans, nuts, fruits, and vegetables. Ask your doctor about taking products that have fiber added to them (fibersupplements). Take in less fat. You can do this by: Eating low-fat dairy products. Eating less red meat. Staying away from processed foods. Drink enough fluid to keep your pee (urine) pale yellow. Managing pain and swelling  Take a  warm-water bath (sitz bath) for 20 minutes to ease pain. Do this 3-4 times a day. You may do this in a bathtub. You may also use a portable sitz bath that fits over the toilet. If told, put ice on the painful area. It may help to use ice between your warm baths. Put ice in a plastic bag. Place a towel between your skin and the bag. Leave the ice on for 20 minutes, 2-3 times a day. If your skin turns bright red, take off the ice right away to prevent skin damage. The risk of damage is higher if you cannot feel pain, heat, or cold. General instructions Exercise. Ask your doctor how much and what kind of exercise is best for you. Go to the bathroom when you need to poop. Do not wait. Try not to push too hard when you poop. Keep your butt dry and clean. Use wet toilet paper or moist towelettes after you poop. Do not sit on the toilet for a long time. Contact a doctor if: You have pain and swelling that do not get better with treatment. You have trouble pooping. You cannot poop. You have pain or swelling outside the area of the hemorrhoids. Get help right away if: You have bleeding from the butt that will not stop. This information is not intended to replace advice given to you by your health care provider. Make sure you discuss any questions you have with your health care provider. Document Revised: 01/17/2022 Document Reviewed: 01/17/2022 Elsevier Patient Education  2024 Elsevier Inc.  

## 2023-01-03 NOTE — Progress Notes (Signed)
Fernando Mood MD, MRCP(U.K) [address]  [address]  [place], [place] [address]  Main: [phone]  Fax: [phone]   Gastroenterology Consultation  Referring Provider:     Karie Schwalbe, MD Primary Care Physician:  Karie Schwalbe, MD Primary Gastroenterologist:  Dr. Wyline Bates  Reason for Consultation:     Hemorrhoids         HPI:   [name] is a 47 y.o. y/o male referred for consultation & management  by Dr. Alphonsus Sias, Berneda Rose, MD.   Being referred today to have evaluation for banding of internal hemorroids . No recent colonoscopy . 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    Has had an ICD implanted in the past.  History of ventricular tachycardia. He had VSD repair.  LV systolic dysfunction.  In December 2018 he had 3 weeks of abdominal bout bloating epigastric pain . CT scan of the abdomen in May 2017 showed inflammatory changes and thickening of the colon from descending colon to rectum.  He had a colonoscopy with Dr Servando Snare in  12/08/2015 no polyps were found biopsies of the terminal ileum and random colon were negative for inflammation and microscopic colitis.      He says that for the past few months has noted blood associated with a bowel movement.  Denies any change in his bowel habits.  Does not have any significant constipation.  Some perianal discomfort feels like the hemorrhoids prolapse.  Past Medical History:  Diagnosis Date   AICD (automatic cardioverter/defibrillator) present    Anxiety    Aortic valve replaced    Requiring replacement, specifics not available   Complication of anesthesia    N & V   Dysrhythmia    Hearing loss of both ears 12/22/2015   Heart murmur    Hypertension    IBD (inflammatory bowel disease) 09/23/2015   ICD (implantable cardiac defibrillator), BSX single    Myocardial infarction [facility])    Obesity 12/22/2015   PONV (postoperative nausea and vomiting)    Secondary cardiomyopathy (HCC)    Syncope     Ventricular septal defect    Ventricular tachycardia (HCC)    appropriate VT shock therapy /13    Past Surgical History:  Procedure Laterality Date   ANGIOPLASTY     RCA repair with vein angioplasty following injury with the aforementioned surgery   AORTIC VALVE REPLACEMENT     Bioprosthesis   AORTIC VALVE REPLACEMENT N/A 04/27/2021   Procedure: REDO AORTIC VALVE REPLACEMENT (AVR) USING ON-X AORTIC VALVE;  Surgeon: Alleen Borne, MD;  Location: [facility] OR;  Service: Open Heart Surgery;  Laterality: N/A;   CARDIAC CATHETERIZATION     CARDIAC DEFIBRILLATOR PLACEMENT     CARDIAC VALVE REPLACEMENT     CHOLECYSTECTOMY     COLONOSCOPY WITH PROPOFOL N/A 12/06/2015   Procedure: COLONOSCOPY WITH PROPOFOL;  Surgeon: Midge Minium, MD;  Location: [facility] ENDOSCOPY;  Service: Endoscopy;  Laterality: N/A;   CORONARY ANGIOPLASTY     CORONARY ARTERY BYPASS GRAFT     IMPLANTABLE CARDIOVERTER DEFIBRILLATOR GENERATOR CHANGE N/A 05/05/2013   Procedure: IMPLANTABLE CARDIOVERTER DEFIBRILLATOR GENERATOR CHANGE;  Surgeon: Duke Salvia, MD;  Location: [facility] CATH LAB;  Service: Cardiovascular;  Laterality: N/A;   INSERT / REPLACE / REMOVE PACEMAKER     RIGHT/LEFT HEART CATH AND CORONARY ANGIOGRAPHY N/A 03/01/2021   Procedure: RIGHT/LEFT HEART CATH AND CORONARY ANGIOGRAPHY;  Surgeon: Tonny Bollman, MD;  Location: [facility] INVASIVE CV LAB;  Service: Cardiovascular;  Laterality: N/A;   TEE WITHOUT CARDIOVERSION N/A 04/27/2021   Procedure: TRANSESOPHAGEAL ECHOCARDIOGRAM (TEE);  Surgeon: Alleen Borne, MD;  Location: [facility] OR;  Service: Open Heart Surgery;  Laterality: N/A;   VSD REPAIR      Prior to Admission medications   Medication Sig Start Date End Date Taking? Authorizing Provider  ranolazine (RANEXA) 1000 MG SR tablet TAKE 1 TABLET TWICE A DAY 11/29/22   Tonny Bollman, MD  acetaminophen (TYLENOL) 500 MG tablet Take 500 mg by mouth every 6 (six) hours as needed for moderate pain.    [provider]   amoxicillin (AMOXIL) 500 MG capsule Take 4 capsules by mouth one hour prior to dental exam 11/23/21   Tonny Bollman, MD  aspirin EC 81 MG EC tablet Take 1 tablet (81 mg total) by mouth daily. Swallow whole. 05/03/21   Ardelle Balls, PA-C  carvedilol (COREG) 6.25 MG tablet Take 1 tablet (6.25 mg total) by mouth 2 (two) times daily. 02/06/22   Tonny Bollman, MD  empagliflozin (JARDIANCE) 10 MG TABS tablet Take 1 tablet (10 mg total) by mouth daily before breakfast. 06/01/22   Swinyer, Zachary George, NP  loratadine (CLARITIN) 10 MG tablet Take by mouth as needed. 05/04/19   [provider]  MAGOX 400 400 (240 Mg) MG tablet TAKE 1 TABLET TWICE A DAY 12/04/22   Tonny Bollman, MD  meclizine (ANTIVERT) 25 MG tablet Take 0.5-1 tablets (12.5-25 mg total) by mouth 3 (three) times daily as needed for dizziness. 02/17/20   Danelle Berry, PA-C  Multiple Vitamin (MULTIVITAMIN) tablet Take 2 tablets by mouth daily.    [provider]  ondansetron (ZOFRAN-ODT) 4 MG disintegrating tablet Take by mouth as needed. 04/12/18   [provider]  sacubitril-valsartan (ENTRESTO) 24-26 MG TAKE 1 TABLET BY MOUTH TWICE DAILY 11/29/22   Tonny Bollman, MD  spironolactone (ALDACTONE) 25 MG tablet Take 1 tablet (25 mg total) by mouth daily. 10/16/22   Sherie Don, NP  warfarin (COUMADIN) 5 MG tablet TAKE ONE AND ONE-HALF TABLETS DAILY OR AS DIRECTED BY THE COUMADIN CLINIC 04/16/22   Tonny Bollman, MD    Family History  Problem Relation Age of Onset   Heart disease Father    Hypertension Maternal Grandmother    Cancer Maternal Grandmother    Hypertension Maternal Grandfather    Cancer Maternal Grandfather      Social History   Tobacco Use   Smoking status: Never    Passive exposure: Past   Smokeless tobacco: Never  Vaping Use   Vaping status: Never Used  Substance Use Topics   Alcohol use: No   Drug use: No    Allergies as of 01/03/2023 - Review Complete 01/03/2023  Allergen  Reaction Noted   Shellfish allergy Nausea And Vomiting 11/06/2010    Review of Systems:    All systems reviewed and negative except where noted in HPI.   Physical Exam:  BP 112/78   Pulse 80   Temp 98.3 F (36.8 C) (Oral)   Ht 5\' 10"  (1.778 m)   Wt 217 lb 6.4 oz (98.6 kg)   BMI 31.19 kg/m  No LMP for male patient. Psych:  Alert and cooperative. Normal Bates and affect. General:   Alert,  Well-developed, well-nourished, pleasant and cooperative in NAD Head:  Normocephalic and atraumatic. Eyes:  Sclera clear, no icterus.   Conjunctiva pink. Ears:  Normal auditory acuity. Neck:  Supple; no masses or thyromegaly. Neurologic:  Alert and oriented x3;  grossly normal neurologically. Psych:  Alert and cooperative. Normal Bates and affect.  Imaging Studies: ECHOCARDIOGRAM COMPLETE  Result Date: 12/05/2022    ECHOCARDIOGRAM REPORT   Patient Name:   RYLEY BAYERL Date of Exam: 12/05/2022 Medical Rec #:  [id number]      Height:       69.3 in Accession #:    [id number]     Weight:       212.0 lb Date of Birth:  03/31/1976      BSA:          2.123 m Patient Age:    46 years       BP:           118/74 mmHg Patient Gender: M              HR:           58-76 bpm. Exam Location:  [facility] Procedure: 2D Echo, 3D Echo, Cardiac Doppler and Color Doppler Indications:    Q21.0 VSD-congenital; I35.8 Other nonrheumatic aortic valve                 disorders  History:        Patient has prior history of Echocardiogram examinations, most                 recent 06/07/2021. Cardiomyopathy and CHF, CAD and Previous                 Myocardial Infarction; Risk Factors:Hypertension and Non-Smoker.                 Aortic Valve: 23 mm Onyx mechanical valve is present in the                 aortic position. Procedure Date: 04/2021.  Sonographer:    Quentin Ore RDMS, RVT, RDCS Referring Phys: [id number] [facility] RIDDLE IMPRESSIONS  1. Left ventricular ejection fraction, by estimation, is 35 to 40%. Left ventricular ejection  fraction by 2D MOD biplane is 38.9 %. The left ventricle has moderately decreased function. The left ventricle demonstrates global hypokinesis. The left ventricular internal cavity size was moderately dilated. Left ventricular diastolic parameters are indeterminate.  2. Right ventricular systolic function is normal. The right ventricular size is normal.  3. The mitral valve is normal in structure. No evidence of mitral valve regurgitation.  4. The aortic valve has been repaired/replaced. Aortic valve regurgitation is not visualized. There is a 23 mm Onyx mechanical valve present in the aortic position. Procedure Date: 04/2021. Aortic valve mean gradient measures 6.2 mmHg.  5. The inferior vena cava is normal in size with greater than 50% respiratory variability, suggesting right atrial pressure of 3 mmHg. FINDINGS  Left Ventricle: Left ventricular ejection fraction, by estimation, is 35 to 40%. Left ventricular ejection fraction by 2D MOD biplane is 38.9 %. The left ventricle has moderately decreased function. The left ventricle demonstrates global hypokinesis. The left ventricular internal cavity size was moderately dilated. There is no left ventricular hypertrophy. Left ventricular diastolic parameters are indeterminate. Right Ventricle: The right ventricular size is normal. No increase in right ventricular wall thickness. Right ventricular systolic function is normal. Left Atrium: Left atrial size was normal in size. Right Atrium: Right atrial size was normal in size. Pericardium: There is no evidence of pericardial effusion. Mitral Valve: The mitral valve is normal in structure. No evidence of mitral valve regurgitation. Tricuspid Valve: The tricuspid valve is normal in structure. Tricuspid valve regurgitation is not demonstrated.  Aortic Valve: The aortic valve has been repaired/replaced. Aortic valve regurgitation is not visualized. Aortic valve mean gradient measures 6.2 mmHg. Aortic valve peak gradient  measures 11.8 mmHg. Aortic valve area, by VTI measures 2.25 cm. There is a 23 mm Onyx mechanical valve present in the aortic position. Procedure Date: 04/2021. Pulmonic Valve: The pulmonic valve was not well visualized. Pulmonic valve regurgitation is not visualized. Aorta: The aortic root and ascending aorta are structurally normal, with no evidence of dilitation. Venous: The inferior vena cava is normal in size with greater than 50% respiratory variability, suggesting right atrial pressure of 3 mmHg. IAS/Shunts: No atrial level shunt detected by color flow Doppler.  LEFT VENTRICLE PLAX 2D                        Biplane EF (MOD) LVIDd:         6.60 cm         LV Biplane EF:   Left LVIDs:         5.50 cm                          ventricular LV PW:         0.90 cm                          ejection LV IVS:        1.00 cm                          fraction by LVOT diam:     2.30 cm                          2D MOD LV SV:         73                               biplane is LV SV Index:   35                               38.9 %. LVOT Area:     4.15 cm                                Diastology                                LV e' medial:    6.20 cm/s LV Volumes (MOD)               LV E/e' medial:  12.1 LV vol d, MOD    231.0 ml      LV e' lateral:   13.20 cm/s A2C:                           LV E/e' lateral: 5.7 LV vol d, MOD    206.0 ml A4C: LV vol s, MOD    148.0 ml A2C: LV vol s, MOD    124.0 ml      3D Volume EF: A4C:  3D EF:        36 % LV SV MOD A2C:   83.0 ml       LV EDV:       245 ml LV SV MOD A4C:   206.0 ml      LV ESV:       157 ml LV SV MOD BP:    86.0 ml       LV SV:        87 ml RIGHT VENTRICLE            IVC RV Basal diam:  3.10 cm    IVC diam: 1.40 cm RV S prime:     8.49 cm/s TAPSE (M-mode): 1.7 cm LEFT ATRIUM             Index        RIGHT ATRIUM           Index LA diam:        3.90 cm 1.84 cm/m   RA Area:     17.10 cm LA Vol (A2C):   54.9 ml 25.86 ml/m  RA Volume:   47.10 ml   22.18 ml/m LA Vol (A4C):   51.6 ml 24.30 ml/m LA Biplane Vol: 59.1 ml 27.84 ml/m  AORTIC VALVE                     PULMONIC VALVE AV Area (Vmax):    2.20 cm      PV Vmax:       1.15 m/s AV Area (Vmean):   2.18 cm      PV Peak grad:  5.3 mmHg AV Area (VTI):     2.25 cm AV Vmax:           172.00 cm/s AV Vmean:          113.200 cm/s AV VTI:            0.326 m AV Peak Grad:      11.8 mmHg AV Mean Grad:      6.2 mmHg LVOT Vmax:         91.20 cm/s LVOT Vmean:        59.367 cm/s LVOT VTI:          0.176 m LVOT/AV VTI ratio: 0.54  AORTA Ao Asc diam:  3.20 cm Ao Arch diam: 2.5 cm MITRAL VALVE               TRICUSPID VALVE MV Area (PHT): 4.89 cm    TR Peak grad:   25.2 mmHg MV Decel Time: 155 msec    TR Vmax:        251.00 cm/s MV E velocity: 74.90 cm/s MV A velocity: 58.70 cm/s  SHUNTS MV E/A ratio:  1.28        Systemic VTI:  0.18 m                            Systemic Diam: 2.30 cm Debbe Odea MD Electronically signed by Debbe Odea MD Signature Date/Time: 12/05/2022/5:24:30 PM    Final     Assessment and Plan:   RICARDO HOLLIE is a 47 y.o. y/o male has been referred for evaluation of hemorrhoids.  He has intermittent rectal bleeding which could be related to hemorrhoids but his colonoscopy was over 7 years back and so I recommend him to have a repeat colonoscopy.  He would best be served with conservative  management of internal hemorrhoids which have discussed in detail including a high-fiber diet perianal toileting and hygiene use of a sitz bath patient information been provided it would be probably high risk for [place] to stop his Coumadin to perform any banding of his hemorrhoids which are 3 sessions and each time we stopped the Coumadin and restarted there is increased risk of complications from blood clots.  Hence would be very reluctant to perform any hemorrhoidal banding unless absolutely needed  I have discussed alternative options, risks & benefits,  which include, but are not limited to,  bleeding, infection, perforation,respiratory complication & drug reaction.  The patient agrees with this plan & written consent will be obtained.     Follow up in a needed  Dr Fernando Mood MD,MRCP(U.K)

## 2023-01-09 ENCOUNTER — Ambulatory Visit: Payer: 59 | Attending: Cardiovascular Disease | Admitting: Cardiovascular Disease

## 2023-01-09 ENCOUNTER — Encounter: Payer: Self-pay | Admitting: Cardiovascular Disease

## 2023-01-09 VITALS — BP 116/72 | HR 77 | Ht 70.0 in | Wt 217.2 lb

## 2023-01-09 DIAGNOSIS — I10 Essential (primary) hypertension: Secondary | ICD-10-CM

## 2023-01-09 DIAGNOSIS — Z7901 Long term (current) use of anticoagulants: Secondary | ICD-10-CM | POA: Diagnosis not present

## 2023-01-09 DIAGNOSIS — Z952 Presence of prosthetic heart valve: Secondary | ICD-10-CM

## 2023-01-09 DIAGNOSIS — I255 Ischemic cardiomyopathy: Secondary | ICD-10-CM | POA: Diagnosis not present

## 2023-01-09 DIAGNOSIS — I472 Ventricular tachycardia, unspecified: Secondary | ICD-10-CM | POA: Diagnosis not present

## 2023-01-09 MED ORDER — ENTRESTO 49-51 MG PO TABS: 1.0000 | ORAL_TABLET | Freq: Two times a day (BID) | ORAL | 3 refills | Status: DC

## 2023-01-09 MED ORDER — ENTRESTO 49-51 MG PO TABS: 1.0000 | ORAL_TABLET | Freq: Two times a day (BID) | ORAL | 0 refills | Status: DC

## 2023-01-09 NOTE — Patient Instructions (Signed)
Medication Instructions:  INCREASE Entresto to 49/51mg  twice daily *If you need a refill on your cardiac medications before your next appointment, please call your pharmacy*   Lab Work: NONE If you have labs (blood work) drawn today and your tests are completely normal, you will receive your results only by: MyChart Message (if you have MyChart) OR A paper copy in the mail If you have any lab test that is abnormal or we need to change your treatment, we will call you to review the results.   Testing/Procedures: NONE   Follow-Up: At [facility], you and your health needs are our priority.  As part of our continuing mission to provide you with exceptional heart care, we have created designated Provider Care Teams.  These Care Teams include your primary Cardiologist (physician) and Advanced Practice Providers (APPs -  Physician Assistants and Nurse Practitioners) who all work together to provide you with the care you need, when you need it.  We recommend signing up for the patient portal called "MyChart".  Sign up information is provided on this After Visit Summary.  MyChart is used to connect with patients for Virtual Visits (Telemedicine).  Patients are able to view lab/test results, encounter notes, upcoming appointments, etc.  Non-urgent messages can be sent to your provider as well.   To learn more about what you can do with MyChart, go to [email or web address].    Your next appointment:   6 month(s)  Provider:   Tonny Bollman, MD

## 2023-01-09 NOTE — Progress Notes (Signed)
Cardiology Office Note:    Date:  01/14/2023   ID:  [name], DOB 07-26-1975, MRN [id number]  PCP:  Karie Schwalbe, MD   [facility] Providers Cardiologist:  Tonny Bollman, MD Electrophysiologist:  Sherryl Manges, MD     Referring MD: Karie Schwalbe, MD   Chief Complaint  Patient presents with   Coronary Artery Disease    History of Present Illness:    [name] is a 47 y.o. male presenting for follow-up evaluation.  The patient has a history of valvular heart disease, congestive heart failure, ventricular tachycardia, paroxysmal atrial fibrillation, and hypertension.  He underwent surgical VSD repair at 47 years old at [facility] in [place].  He then underwent aortic valve replacement at age 52 with a bioprosthetic valve.  During that surgery he had an injury to the RCA and suffered an inferior infarct.  He began having problems with ventricular tachycardia in 2008 and was ultimately treated with medical therapy and an ICD.  He developed progressive bioprosthetic aortic valve dysfunction associated with severe LV dysfunction and ultimately underwent another redo median sternotomy with aortic valve replacement using a 23 mm On-X mechanical valve in 2022.  The patient was last seen in our office in July 2023 by Eligha Bridegroom.  He is here alone today. Complains of generalized fatigue. He's able to walk briskly 1-2 miles a few days per week but has been inconsistent with this, not walking regularly in the heat.  He states that occasionally he gets a feeling similar to "coming down with the flu."  He denies orthopnea, PND, chest pain, or chest pressure.  Past Medical History:  Diagnosis Date   AICD (automatic cardioverter/defibrillator) present    Anxiety    Aortic valve replaced    Requiring replacement, specifics not available   Complication of anesthesia    N & V   Dysrhythmia    Hearing loss of both ears 12/22/2015   Heart  murmur    Hypertension    IBD (inflammatory bowel disease) 09/23/2015   ICD (implantable cardiac defibrillator), BSX single    Myocardial infarction [facility])    Obesity 12/22/2015   PONV (postoperative nausea and vomiting)    Secondary cardiomyopathy (HCC)    Syncope    Ventricular septal defect    Ventricular tachycardia (HCC)    appropriate VT shock therapy /13    Past Surgical History:  Procedure Laterality Date   ANGIOPLASTY     RCA repair with vein angioplasty following injury with the aforementioned surgery   AORTIC VALVE REPLACEMENT     Bioprosthesis   AORTIC VALVE REPLACEMENT N/A 04/27/2021   Procedure: REDO AORTIC VALVE REPLACEMENT (AVR) USING ON-X AORTIC VALVE;  Surgeon: Alleen Borne, MD;  Location: [facility] OR;  Service: Open Heart Surgery;  Laterality: N/A;   CARDIAC CATHETERIZATION     CARDIAC DEFIBRILLATOR PLACEMENT     CARDIAC VALVE REPLACEMENT     CHOLECYSTECTOMY     COLONOSCOPY WITH PROPOFOL N/A 12/06/2015   Procedure: COLONOSCOPY WITH PROPOFOL;  Surgeon: Midge Minium, MD;  Location: [facility] ENDOSCOPY;  Service: Endoscopy;  Laterality: N/A;   CORONARY ANGIOPLASTY     CORONARY ARTERY BYPASS GRAFT     IMPLANTABLE CARDIOVERTER DEFIBRILLATOR GENERATOR CHANGE N/A 05/05/2013   Procedure: IMPLANTABLE CARDIOVERTER DEFIBRILLATOR GENERATOR CHANGE;  Surgeon: Duke Salvia, MD;  Location: [facility] CATH LAB;  Service: Cardiovascular;  Laterality: N/A;   INSERT / REPLACE / REMOVE PACEMAKER  RIGHT/LEFT HEART CATH AND CORONARY ANGIOGRAPHY N/A 03/01/2021   Procedure: RIGHT/LEFT HEART CATH AND CORONARY ANGIOGRAPHY;  Surgeon: Tonny Bollman, MD;  Location: [facility] INVASIVE CV LAB;  Service: Cardiovascular;  Laterality: N/A;   TEE WITHOUT CARDIOVERSION N/A 04/27/2021   Procedure: TRANSESOPHAGEAL ECHOCARDIOGRAM (TEE);  Surgeon: Alleen Borne, MD;  Location: [facility] OR;  Service: Open Heart Surgery;  Laterality: N/A;   VSD REPAIR      Current Medications: Current Meds  Medication Sig    acetaminophen (TYLENOL) 500 MG tablet Take 500 mg by mouth every 6 (six) hours as needed for moderate pain.   amoxicillin (AMOXIL) 500 MG capsule Take 4 capsules by mouth one hour prior to dental exam   aspirin EC 81 MG EC tablet Take 1 tablet (81 mg total) by mouth daily. Swallow whole.   empagliflozin (JARDIANCE) 10 MG TABS tablet Take 1 tablet (10 mg total) by mouth daily before breakfast.   loratadine (CLARITIN) 10 MG tablet Take by mouth as needed.   MAGOX 400 400 (240 Mg) MG tablet TAKE 1 TABLET TWICE A DAY   meclizine (ANTIVERT) 25 MG tablet Take 0.5-1 tablets (12.5-25 mg total) by mouth 3 (three) times daily as needed for dizziness.   Multiple Vitamin (MULTIVITAMIN) tablet Take 2 tablets by mouth daily.   ondansetron (ZOFRAN-ODT) 4 MG disintegrating tablet Take by mouth as needed.   ranolazine (RANEXA) 1000 MG SR tablet TAKE 1 TABLET TWICE A DAY   sacubitril-valsartan (ENTRESTO) 49-51 MG Take 1 tablet by mouth 2 (two) times daily.   sacubitril-valsartan (ENTRESTO) 49-51 MG Take 1 tablet by mouth 2 (two) times daily.   spironolactone (ALDACTONE) 25 MG tablet Take 1 tablet (25 mg total) by mouth daily.   warfarin (COUMADIN) 5 MG tablet TAKE ONE AND ONE-HALF TABLETS DAILY OR AS DIRECTED BY THE COUMADIN CLINIC   [DISCONTINUED] carvedilol (COREG) 6.25 MG tablet Take 1 tablet (6.25 mg total) by mouth 2 (two) times daily.   [DISCONTINUED] sacubitril-valsartan (ENTRESTO) 24-26 MG TAKE 1 TABLET BY MOUTH TWICE DAILY     Allergies:   Shellfish allergy   Social History   Socioeconomic History   Marital status: Married    Spouse name: [name]   Number of children: 2   Years of education: 17   Highest education level: Bachelor's degree (e.g., BA, AB, BS)  Occupational History   Occupation: IT    Employer: [employer]  Tobacco Use   Smoking status: Never    Passive exposure: Past   Smokeless tobacco: Never  Vaping Use   Vaping status: Never Used  Substance and Sexual Activity    Alcohol use: No   Drug use: No   Sexual activity: Yes    Partners: Female  Other Topics Concern   Not on file  Social History Narrative   Has living will   Wife is health care POA--alternate is sister [name]   Would accept resuscitation    No feeding tube if persistent cognitively unaware   Social Determinants of Health   Financial Resource Strain: Low Risk  (02/17/2020)   Overall Financial Resource Strain (CARDIA)    Difficulty of Paying Living Expenses: Not hard at all  Food Insecurity: No Food Insecurity (02/17/2020)   Hunger Vital Sign    Worried About Running Out of Food in the Last Year: Never true    Ran Out of Food in the Last Year: Never true  Transportation Needs: No Transportation Needs (02/17/2020)   PRAPARE - Transportation    Lack of  Transportation (Medical): No    Lack of Transportation (Non-Medical): No  Physical Activity: Inactive (02/17/2020)   Exercise Vital Sign    Days of Exercise per Week: 0 days    Minutes of Exercise per Session: 0 min  Stress: No Stress Concern Present (02/17/2020)   [employer] of Occupational Health - Occupational Stress Questionnaire    Feeling of Stress : Not at all  Social Connections: Moderately Integrated (02/17/2020)   Social Connection and Isolation Panel [NHANES]    Frequency of Communication with Friends and Family: Twice a week    Frequency of Social Gatherings with Friends and Family: Twice a week    Attends Religious Services: 1 to 4 times per year    Active Member of [employer] or Organizations: No    Attends Engineer, structural: Never    Marital Status: Married     Family History: The patient's family history includes Cancer in his maternal grandfather and maternal grandmother; Heart disease in his father; Hypertension in his maternal grandfather and maternal grandmother.  ROS:   Please see the history of present illness.    All other systems reviewed and are negative.  EKGs/Labs/Other Studies Reviewed:     The following studies were reviewed today: Most recent echo reviewed with LVEF 35 to 40%, normal mechanical aortic valve function with a mean gradient of 6 mmHg.      Recent Labs: 05/07/2022: ALT 27; Hemoglobin 15.5; Platelets 180 10/12/2022: B Natriuretic Peptide 137.9; Magnesium 2.1 11/14/2022: BUN 18; Creatinine, Ser 1.12; Potassium 4.3; Sodium 136  Recent Lipid Panel    Component Value Date/Time   CHOL 180 10/12/2022 1155   CHOL 209 (H) 05/07/2022 1320   TRIG 147 10/12/2022 1155   HDL 43 10/12/2022 1155   HDL 47 05/07/2022 1320   CHOLHDL 4.2 10/12/2022 1155   VLDL 29 10/12/2022 1155   LDLCALC 108 (H) 10/12/2022 1155   LDLCALC 138 (H) 05/07/2022 1320   LDLCALC 102 (H) 07/11/2021 1438           Physical Exam:    VS:  BP 116/72   Pulse 77   Ht 5\' 10"  (1.778 m)   Wt 217 lb 3.2 oz (98.5 kg)   SpO2 97%   BMI 31.16 kg/m     Wt Readings from Last 3 Encounters:  01/09/23 217 lb 3.2 oz (98.5 kg)  01/03/23 217 lb 6.4 oz (98.6 kg)  11/28/22 212 lb (96.2 kg)     GEN:  Well nourished, well developed in no acute distress HEENT: Normal NECK: No JVD; No carotid bruits LYMPHATICS: No lymphadenopathy CARDIAC: RRR, 2/6 ejection murmur at the right upper sternal border with normal mechanical A2 RESPIRATORY:  Clear to auscultation without rales, wheezing or rhonchi  ABDOMEN: Soft, non-tender, non-distended MUSCULOSKELETAL:  No edema; No deformity  SKIN: Warm and dry NEUROLOGIC:  Alert and oriented x 3 PSYCHIATRIC:  Normal affect   ASSESSMENT:    1. H/O aortic valve replacement   2. Long term (current) use of anticoagulants   3. Ischemic cardiomyopathy   4. Sustained ventricular tachycardia (HCC)   5. Essential hypertension    PLAN:    In order of problems listed above:  Patient with normal valve function on his most recent echo checked postoperatively.  Continue current management.  With an On-X valve he is managed with low-dose aspirin and warfarin. Tolerating  warfarin with a lower goal INR in the setting of an On-X valve per guidelines. LVEF modestly improved following surgery at  35 to 40%.  Patient's only symptom is generalized fatigue.  I advised him to increase Entresto to 49/51 mg twice daily and otherwise continue on his current regimen which includes spironolactone, carvedilol, and Jardiance. Doing well with no recent ICD discharges.  Followed regularly by EP. Blood pressure well-controlled on current regimen.  Hopefully he will tolerate the increased Entresto dose.  We discussed potential side effects.      Medication Adjustments/Labs and Tests Ordered: Current medicines are reviewed at length with the patient today.  Concerns regarding medicines are outlined above.  No orders of the defined types were placed in this encounter.  Meds ordered this encounter  Medications   sacubitril-valsartan (ENTRESTO) 49-51 MG    Sig: Take 1 tablet by mouth 2 (two) times daily.    Dispense:  180 tablet    Refill:  3    Dose INCREASE   sacubitril-valsartan (ENTRESTO) 49-51 MG    Sig: Take 1 tablet by mouth 2 (two) times daily.    Dispense:  60 tablet    Refill:  0    Patient Instructions  Medication Instructions:  INCREASE Entresto to 49/51mg  twice daily *If you need a refill on your cardiac medications before your next appointment, please call your pharmacy*   Lab Work: NONE If you have labs (blood work) drawn today and your tests are completely normal, you will receive your results only by: MyChart Message (if you have MyChart) OR A paper copy in the mail If you have any lab test that is abnormal or we need to change your treatment, we will call you to review the results.   Testing/Procedures: NONE   Follow-Up: At [facility], you and your health needs are our priority.  As part of our continuing mission to provide you with exceptional heart care, we have created designated Provider Care Teams.  These Care Teams include your  primary Cardiologist (physician) and Advanced Practice Providers (APPs -  Physician Assistants and Nurse Practitioners) who all work together to provide you with the care you need, when you need it.  We recommend signing up for the patient portal called "MyChart".  Sign up information is provided on this After Visit Summary.  MyChart is used to connect with patients for Virtual Visits (Telemedicine).  Patients are able to view lab/test results, encounter notes, upcoming appointments, etc.  Non-urgent messages can be sent to your provider as well.   To learn more about what you can do with MyChart, go to [email or web address].    Your next appointment:   6 month(s)  Provider:   Tonny Bollman, MD        Signed, Tonny Bollman, MD  01/14/2023 8:45 AM    [facility]

## 2023-01-11 ENCOUNTER — Other Ambulatory Visit: Payer: Self-pay | Admitting: Cardiovascular Disease

## 2023-01-28 ENCOUNTER — Ambulatory Visit: Payer: 59 | Admitting: Gastroenterology

## 2023-02-07 ENCOUNTER — Ambulatory Visit (INDEPENDENT_AMBULATORY_CARE_PROVIDER_SITE_OTHER): Payer: 59

## 2023-02-07 DIAGNOSIS — I255 Ischemic cardiomyopathy: Secondary | ICD-10-CM

## 2023-02-07 LAB — CUP PACEART REMOTE DEVICE CHECK
Battery Remaining Longevity: 48 mo
Battery Remaining Percentage: 50 %
Brady Statistic RV Percent Paced: 0 %
Date Time Interrogation Session: 20240919030200
HighPow Impedance: 57 Ohm
Implantable Lead Connection Status: 753985
Implantable Lead Implant Date: 20080214
Implantable Lead Location: 753860
Implantable Lead Model: 185
Implantable Lead Serial Number: 178017
Implantable Pulse Generator Implant Date: 20141216
Lead Channel Impedance Value: 667 Ohm
Lead Channel Pacing Threshold Amplitude: 1 V
Lead Channel Pacing Threshold Pulse Width: 0.4 ms
Lead Channel Setting Pacing Amplitude: 2 V
Lead Channel Setting Pacing Pulse Width: 0.4 ms
Lead Channel Setting Sensing Sensitivity: 0.6 mV
Pulse Gen Serial Number: 126781

## 2023-02-13 ENCOUNTER — Encounter: Payer: Self-pay | Admitting: Gastroenterology

## 2023-02-13 ENCOUNTER — Ambulatory Visit: Payer: 59

## 2023-02-15 ENCOUNTER — Encounter: Payer: Self-pay | Admitting: Internal Medicine

## 2023-02-15 ENCOUNTER — Telehealth (HOSPITAL_BASED_OUTPATIENT_CLINIC_OR_DEPARTMENT_OTHER): Payer: Self-pay | Admitting: *Deleted

## 2023-02-15 NOTE — Telephone Encounter (Signed)
   Pre-operative Risk Assessment    Patient Name: [name]  DOB: [birth year] MRN: [id number]      Request for Surgical Clearance    Procedure:   Colonoscopy  Date of Surgery:  Clearance 03/04/23                                 Surgeon:  Dr. Katy Apo Group or Practice Name:  [facility] Phone number:  [phone] Fax number:  [phone]   Type of Clearance Requested:   - Medical  - Pharmacy:  Hold Aspirin and Warfarin (Coumadin) Not Indicated.   Type of Anesthesia:  Not Indicated   Additional requests/questions:  Last appointment with Dr. Excell Seltzer on 01/09/2023.  Signed, Emmit Pomfret   02/15/2023, 10:57 AM

## 2023-02-15 NOTE — Telephone Encounter (Signed)
Patient with diagnosis of ON-X aortic valve, afib  on warfarin for anticoagulation.    Procedure: Colonoscopy  Date of procedure: 03/04/2023     CrCl >100 mL/min Platelet count 180 K (04/2022)   Per office protocol, patient can hold warfarin for 5 days prior to procedure.Patient will need bridging with Lovenox (enoxaparin) around procedure.  This will be organized by the Coumadin clinic ( appointment on 10/2 at [facility] clinic)  **This guidance is not considered finalized until pre-operative APP has relayed final recommendations.**

## 2023-02-15 NOTE — Telephone Encounter (Signed)
Pt has ON-X aortic valve. Will send coumadin hold to pharmD

## 2023-02-15 NOTE — Progress Notes (Signed)
PERIOPERATIVE PRESCRIPTION FOR IMPLANTED CARDIAC DEVICE PROGRAMMING  Patient Information: Name:  [name]  DOB:  October 19, 1975  MRN:  [id number]  Procedure:   Colonoscopy   Date of Surgery:  Clearance 03/04/23                                 Surgeon:  Dr. Katy Apo Group or Practice Name:  [facility]  GI Phone number:  [phone] Fax number:  [phone]   Type of Clearance Requested:   - Medical  - Pharmacy:  Hold Aspirin and Warfarin (Coumadin) Not Indicated.   Type of Anesthesia:  Not Indicated  Device Information:  Clinic EP Physician:  Sherryl Manges, MD   Device Type:  Defibrillator Manufacturer and Phone #:  [employer]: [phone] Pacemaker Dependent?:  No. Date of Last Device Check:  02/07/2023 Normal Device Function?:  Yes.    Electrophysiologist's Recommendations:  Have magnet available. Provide continuous ECG monitoring when magnet is used or reprogramming is to be performed.  Procedure should not interfere with device function.  No device programming or magnet placement needed.  Per Device Clinic Standing Orders, Wiliam Ke, RN  11:45 AM 02/15/2023

## 2023-02-15 NOTE — Telephone Encounter (Signed)
     Primary Cardiologist: Tonny Bollman, MD  Chart reviewed as part of pre-operative protocol coverage. Given past medical history and time since last visit, based on ACC/AHA guidelines, [name] would be at acceptable risk for the planned procedure without further cardiovascular testing.   Patient with diagnosis of ON-X aortic valve, afib  on warfarin for anticoagulation.     Procedure: Colonoscopy  Date of procedure: 03/04/2023        CrCl >100 mL/min Platelet count 180 K (04/2022)     Per office protocol, patient can hold warfarin for 5 days prior to procedure.Patient will need bridging with Lovenox (enoxaparin) around procedure.  This will be organized by the Coumadin clinic ( appointment on 10/2 at [facility] clinic)  I will route this recommendation to the requesting party via Epic fax function and remove from pre-op pool.  Please call with questions.  Thomasene Ripple. Daun Rens NP-C     02/15/2023, 2:24 PM [facility] [address] Office [phone] Fax [phone]

## 2023-02-19 NOTE — Progress Notes (Signed)
Remote ICD transmission.   

## 2023-02-20 ENCOUNTER — Ambulatory Visit: Payer: 59 | Attending: Cardiovascular Disease

## 2023-02-20 DIAGNOSIS — I350 Nonrheumatic aortic (valve) stenosis: Secondary | ICD-10-CM | POA: Diagnosis not present

## 2023-02-20 DIAGNOSIS — Z952 Presence of prosthetic heart valve: Secondary | ICD-10-CM

## 2023-02-20 DIAGNOSIS — Z7901 Long term (current) use of anticoagulants: Secondary | ICD-10-CM

## 2023-02-20 LAB — POCT INR: INR: 2.3 (ref 2.0–3.0)

## 2023-02-20 MED ORDER — ENOXAPARIN SODIUM 100 MG/ML IJ SOSY: 100.0000 mg | PREFILLED_SYRINGE | Freq: Two times a day (BID) | INTRAMUSCULAR | 1 refills | Status: DC

## 2023-02-20 NOTE — Patient Instructions (Signed)
Continue taking warfarin 1.5 tablets daily. Recheck INR in 3 weeks.  Be consistent with green (2 times per week) Colonoscopy 10/14 Holding 5 days prior with Lovenox Bridge. Coumadin Clinic [phone]  10/8: Last dose of warfarin.  10/9: No warfarin or enoxaparin (Lovenox).  10/10: Inject enoxaparin 100mg  in the fatty abdominal tissue at least 2 inches from the belly button twice a day about 12 hours apart, 8am and 8pm rotate sites. No warfarin.  10/11: Inject enoxaparin in the fatty tissue every 12 hours, 8am and 8pm. No warfarin.  10/12: Inject enoxaparin in the fatty tissue every 12 hours, 8am and 8pm. No warfarin.  10/13: Inject enoxaparin in the fatty tissue in the morning at 8 am (No PM dose). No warfarin.  10/14: Procedure Day - No enoxaparin - Resume warfarin in the evening or as directed by doctor (take an extra half tablet with usual dose for 2 days then resume normal dose).  10/15: Resume enoxaparin inject in the fatty tissue every 12 hours and take warfarin  10/16: Inject enoxaparin in the fatty tissue every 12 hours and take warfarin  10/17: Inject enoxaparin in the fatty tissue every 12 hours and take warfarin  10/18: Inject enoxaparin in the fatty tissue every 12 hours and take warfarin  10/19: Inject enoxaparin in the fatty tissue every 12 hours and take warfarin  10/23: warfarin appt to check INR.

## 2023-02-24 ENCOUNTER — Other Ambulatory Visit: Payer: Self-pay | Admitting: Cardiovascular Disease

## 2023-02-26 ENCOUNTER — Other Ambulatory Visit: Payer: Self-pay | Admitting: Nurse Practitioner

## 2023-02-26 ENCOUNTER — Telehealth: Payer: Self-pay | Admitting: Cardiovascular Disease

## 2023-02-26 NOTE — Telephone Encounter (Signed)
Pt's medication was sent to pt's pharmacy as requested. Confirmation received.  °

## 2023-02-26 NOTE — Telephone Encounter (Signed)
*  STAT* If patient is at the pharmacy, call can be transferred to refill team.   1. Which medications need to be refilled? (please list name of each medication and dose if known)   empagliflozin (JARDIANCE) 10 MG TABS tablet    2. Which pharmacy/location (including street and city if local pharmacy) is medication to be sent to?    [id number] [address], [place] [phone] [address]   3. Do they need a 30 day or 90 day supply? 90

## 2023-03-03 ENCOUNTER — Telehealth: Payer: Self-pay | Admitting: Physician Assistant

## 2023-03-03 DIAGNOSIS — I429 Cardiomyopathy, unspecified: Secondary | ICD-10-CM

## 2023-03-03 MED ORDER — EMPAGLIFLOZIN 10 MG PO TABS: 10.0000 mg | ORAL_TABLET | Freq: Every day | ORAL | 3 refills | Status: DC

## 2023-03-04 ENCOUNTER — Ambulatory Visit: Payer: 59 | Admitting: Certified Registered"

## 2023-03-04 ENCOUNTER — Encounter
Admission: RE | Disposition: A | Discharge status: Home / Self Care | Payer: Self-pay | Source: Home / Self Care | Attending: Gastroenterology

## 2023-03-04 ENCOUNTER — Ambulatory Visit
Admission: RE | Admit: 2023-03-04 | Discharge: 2023-03-04 | Disposition: A | Discharge status: Home / Self Care | Payer: 59 | Attending: Gastroenterology | Admitting: Gastroenterology

## 2023-03-04 ENCOUNTER — Telehealth: Payer: Self-pay

## 2023-03-04 DIAGNOSIS — Z7984 Long term (current) use of oral hypoglycemic drugs: Secondary | ICD-10-CM | POA: Diagnosis not present

## 2023-03-04 DIAGNOSIS — E669 Obesity, unspecified: Secondary | ICD-10-CM | POA: Insufficient documentation

## 2023-03-04 DIAGNOSIS — Z95810 Presence of automatic (implantable) cardiac defibrillator: Secondary | ICD-10-CM | POA: Diagnosis not present

## 2023-03-04 DIAGNOSIS — Z79899 Other long term (current) drug therapy: Secondary | ICD-10-CM | POA: Insufficient documentation

## 2023-03-04 DIAGNOSIS — D123 Benign neoplasm of transverse colon: Secondary | ICD-10-CM | POA: Diagnosis not present

## 2023-03-04 DIAGNOSIS — K640 First degree hemorrhoids: Secondary | ICD-10-CM | POA: Insufficient documentation

## 2023-03-04 DIAGNOSIS — Z951 Presence of aortocoronary bypass graft: Secondary | ICD-10-CM | POA: Diagnosis not present

## 2023-03-04 DIAGNOSIS — I252 Old myocardial infarction: Secondary | ICD-10-CM | POA: Diagnosis not present

## 2023-03-04 DIAGNOSIS — K625 Hemorrhage of anus and rectum: Secondary | ICD-10-CM

## 2023-03-04 DIAGNOSIS — D126 Benign neoplasm of colon, unspecified: Secondary | ICD-10-CM

## 2023-03-04 DIAGNOSIS — I472 Ventricular tachycardia, unspecified: Secondary | ICD-10-CM | POA: Insufficient documentation

## 2023-03-04 DIAGNOSIS — Z7982 Long term (current) use of aspirin: Secondary | ICD-10-CM | POA: Insufficient documentation

## 2023-03-04 DIAGNOSIS — Z7901 Long term (current) use of anticoagulants: Secondary | ICD-10-CM | POA: Diagnosis not present

## 2023-03-04 DIAGNOSIS — I509 Heart failure, unspecified: Secondary | ICD-10-CM | POA: Insufficient documentation

## 2023-03-04 DIAGNOSIS — I10 Essential (primary) hypertension: Secondary | ICD-10-CM | POA: Insufficient documentation

## 2023-03-04 DIAGNOSIS — Z952 Presence of prosthetic heart valve: Secondary | ICD-10-CM | POA: Insufficient documentation

## 2023-03-04 DIAGNOSIS — I110 Hypertensive heart disease with heart failure: Secondary | ICD-10-CM | POA: Diagnosis not present

## 2023-03-04 DIAGNOSIS — Z6830 Body mass index (BMI) 30.0-30.9, adult: Secondary | ICD-10-CM | POA: Insufficient documentation

## 2023-03-04 DIAGNOSIS — F419 Anxiety disorder, unspecified: Secondary | ICD-10-CM | POA: Insufficient documentation

## 2023-03-04 DIAGNOSIS — I255 Ischemic cardiomyopathy: Secondary | ICD-10-CM | POA: Diagnosis not present

## 2023-03-04 HISTORY — PX: POLYPECTOMY: SHX5525

## 2023-03-04 HISTORY — PX: COLONOSCOPY WITH PROPOFOL: SHX5780

## 2023-03-04 SURGERY — COLONOSCOPY WITH PROPOFOL
Anesthesia: General

## 2023-03-04 MED ORDER — SODIUM CHLORIDE 0.9 % IV SOLN
INTRAVENOUS | Status: DC
  Administered 2023-03-04: 20 mL/h via INTRAVENOUS

## 2023-03-04 MED ORDER — PROPOFOL 500 MG/50ML IV EMUL
INTRAVENOUS | Status: DC | PRN
Start: 2023-03-04 — End: 2023-03-04
  Administered 2023-03-04: 100 mg via INTRAVENOUS
  Administered 2023-03-04: 150 ug/kg/min via INTRAVENOUS

## 2023-03-04 NOTE — H&P (Signed)
[name], MD [address], [address], [place], [place], [address] [address], [address], [place], [place], [address] Phone: [phone]  Fax: [phone]  Primary Care Physician:  Karie Schwalbe, MD   Pre-Procedure History & Physical: HPI:  [name] is a 47 y.o. male is here for an colonoscopy.   Past Medical History:  Diagnosis Date   AICD (automatic cardioverter/defibrillator) present    Anxiety    Aortic valve replaced    Requiring replacement, specifics not available   Complication of anesthesia    N & V   Dysrhythmia    Hearing loss of both ears 12/22/2015   Heart murmur    Hypertension    IBD (inflammatory bowel disease) 09/23/2015   ICD (implantable cardiac defibrillator), BSX single    Myocardial infarction [facility])    Obesity 12/22/2015   PONV (postoperative nausea and vomiting)    Secondary cardiomyopathy (HCC)    Syncope    Ventricular septal defect    Ventricular tachycardia (HCC)    appropriate VT shock therapy /13    Past Surgical History:  Procedure Laterality Date   ANGIOPLASTY     RCA repair with vein angioplasty following injury with the aforementioned surgery   AORTIC VALVE REPLACEMENT     Bioprosthesis   AORTIC VALVE REPLACEMENT N/A 04/27/2021   Procedure: REDO AORTIC VALVE REPLACEMENT (AVR) USING ON-X AORTIC VALVE;  Surgeon: Alleen Borne, MD;  Location: [facility] OR;  Service: Open Heart Surgery;  Laterality: N/A;   CARDIAC CATHETERIZATION     CARDIAC DEFIBRILLATOR PLACEMENT     CARDIAC VALVE REPLACEMENT     CHOLECYSTECTOMY     COLONOSCOPY WITH PROPOFOL N/A 12/06/2015   Procedure: COLONOSCOPY WITH PROPOFOL;  Surgeon: Midge Minium, MD;  Location: [facility] ENDOSCOPY;  Service: Endoscopy;  Laterality: N/A;   CORONARY ANGIOPLASTY     CORONARY ARTERY BYPASS GRAFT     IMPLANTABLE CARDIOVERTER DEFIBRILLATOR GENERATOR CHANGE N/A 05/05/2013   Procedure: IMPLANTABLE CARDIOVERTER DEFIBRILLATOR GENERATOR CHANGE;  Surgeon: Duke Salvia, MD;   Location: [facility] CATH LAB;  Service: Cardiovascular;  Laterality: N/A;   INSERT / REPLACE / REMOVE PACEMAKER     RIGHT/LEFT HEART CATH AND CORONARY ANGIOGRAPHY N/A 03/01/2021   Procedure: RIGHT/LEFT HEART CATH AND CORONARY ANGIOGRAPHY;  Surgeon: Tonny Bollman, MD;  Location: [facility] INVASIVE CV LAB;  Service: Cardiovascular;  Laterality: N/A;   TEE WITHOUT CARDIOVERSION N/A 04/27/2021   Procedure: TRANSESOPHAGEAL ECHOCARDIOGRAM (TEE);  Surgeon: Alleen Borne, MD;  Location: [facility] OR;  Service: Open Heart Surgery;  Laterality: N/A;   VSD REPAIR      Prior to Admission medications   Medication Sig Start Date End Date Taking? Authorizing Provider  acetaminophen (TYLENOL) 500 MG tablet Take 500 mg by mouth every 6 (six) hours as needed for moderate pain.   Yes [provider]  amoxicillin (AMOXIL) 500 MG capsule Take 4 capsules by mouth one hour prior to dental exam 11/23/21  Yes Tonny Bollman, MD  aspirin EC 81 MG EC tablet Take 1 tablet (81 mg total) by mouth daily. Swallow whole. 05/03/21  Yes Doree Fudge M, PA-C  carvedilol (COREG) 6.25 MG tablet Take 1 tablet (6.25 mg total) by mouth 2 (two) times daily with a meal. 01/11/23  Yes Tonny Bollman, MD  empagliflozin (JARDIANCE) 10 MG TABS tablet Take 1 tablet (10 mg total) by mouth daily before breakfast. 03/03/23  Yes Duke, Roe Rutherford, PA  enoxaparin (LOVENOX) 100 MG/ML injection Inject 1 mL (100  mg total) into the skin every 12 (twelve) hours. 02/20/23  Yes Tonny Bollman, MD  loratadine (CLARITIN) 10 MG tablet Take by mouth as needed. 05/04/19  Yes [provider]  MAGOX 400 400 (240 Mg) MG tablet TAKE 1 TABLET TWICE A DAY 12/04/22  Yes Tonny Bollman, MD  meclizine (ANTIVERT) 25 MG tablet Take 0.5-1 tablets (12.5-25 mg total) by mouth 3 (three) times daily as needed for dizziness. 02/17/20  Yes Danelle Berry, PA-C  Multiple Vitamin (MULTIVITAMIN) tablet Take 2 tablets by mouth daily.   Yes [provider]   ondansetron (ZOFRAN-ODT) 4 MG disintegrating tablet Take by mouth as needed. 04/12/18  Yes [provider]  ranolazine (RANEXA) 1000 MG SR tablet TAKE 1 TABLET TWICE A DAY 11/29/22  Yes Tonny Bollman, MD  sacubitril-valsartan (ENTRESTO) 49-51 MG Take 1 tablet by mouth 2 (two) times daily. 01/09/23  Yes Tonny Bollman, MD  sacubitril-valsartan (ENTRESTO) 49-51 MG Take 1 tablet by mouth 2 (two) times daily. 01/09/23  Yes Tonny Bollman, MD  spironolactone (ALDACTONE) 25 MG tablet Take 1 tablet (25 mg total) by mouth daily. 10/16/22  Yes Sherie Don, NP  warfarin (COUMADIN) 5 MG tablet TAKE 1 AND 1/2 TABLETS BY MOUTH DAILY OR AS DIRECTED BY THE COUMADIN CLINIC 02/25/23  Yes Tonny Bollman, MD    Allergies as of 01/04/2023 - Review Complete 01/03/2023  Allergen Reaction Noted   Shellfish allergy Nausea And Vomiting 11/06/2010    Family History  Problem Relation Age of Onset   Heart disease Father    Hypertension Maternal Grandmother    Cancer Maternal Grandmother    Hypertension Maternal Grandfather    Cancer Maternal Grandfather     Social History   Socioeconomic History   Marital status: Married    Spouse name: [name]   Number of children: 2   Years of education: 17   Highest education level: Bachelor's degree (e.g., BA, AB, BS)  Occupational History   Occupation: IT    Employer: [employer]  Tobacco Use   Smoking status: Never    Passive exposure: Past   Smokeless tobacco: Never  Vaping Use   Vaping status: Never Used  Substance and Sexual Activity   Alcohol use: No   Drug use: No   Sexual activity: Yes    Partners: Female  Other Topics Concern   Not on file  Social History Narrative   Has living will   Wife is health care POA--alternate is sister [name]   Would accept resuscitation    No feeding tube if persistent cognitively unaware   Social Determinants of Health   Financial Resource Strain: Low Risk  (02/17/2020)   Overall Financial  Resource Strain (CARDIA)    Difficulty of Paying Living Expenses: Not hard at all  Food Insecurity: No Food Insecurity (02/17/2020)   Hunger Vital Sign    Worried About Running Out of Food in the Last Year: Never true    Ran Out of Food in the Last Year: Never true  Transportation Needs: No Transportation Needs (02/17/2020)   PRAPARE - Administrator, Civil Service (Medical): No    Lack of Transportation (Non-Medical): No  Physical Activity: Inactive (02/17/2020)   Exercise Vital Sign    Days of Exercise per Week: 0 days    Minutes of Exercise per Session: 0 min  Stress: No Stress Concern Present (02/17/2020)   [employer] of Occupational Health - Occupational Stress Questionnaire    Feeling of Stress : Not at all  Social Connections: Moderately Integrated (02/17/2020)   Social Connection and Isolation Panel [NHANES]    Frequency of Communication with Friends and Family: Twice a week    Frequency of Social Gatherings with Friends and Family: Twice a week    Attends Religious Services: 1 to 4 times per year    Active Member of [employer] or Organizations: No    Attends Banker Meetings: Never    Marital Status: Married  Catering manager Violence: Not At Risk (02/17/2020)   Humiliation, Afraid, Rape, and Kick questionnaire    Fear of Current or Ex-Partner: No    Emotionally Abused: No    Physically Abused: No    Sexually Abused: No    Review of Systems: See HPI, otherwise negative ROS  Physical Exam: BP 117/78   Pulse 80   Temp (!) 97.2 F (36.2 C) (Temporal)   Resp 18   Ht 5\' 10"  (1.778 m)   Wt 97.1 kg   SpO2 100%   BMI 30.71 kg/m  General:   Alert,  pleasant and cooperative in NAD Head:  Normocephalic and atraumatic. Neck:  Supple; no masses or thyromegaly. Lungs:  Clear throughout to auscultation, normal respiratory effort.    Heart:  +S1, +S2, Regular rate and rhythm, No edema. Abdomen:  Soft, nontender and nondistended. Normal bowel sounds,  without guarding, and without rebound.   Neurologic:  Alert and  oriented x4;  grossly normal neurologically.  Impression/Plan: Patric Dykes is here for an colonoscopy to be performed for rectal bleeding. Risks, benefits, limitations, and alternatives regarding  colonoscopy have been reviewed with the patient.  Questions have been answered.  All parties agreeable.   [name], MD  03/04/2023, 10:35 AM

## 2023-03-04 NOTE — Anesthesia Postprocedure Evaluation (Signed)
Anesthesia Post Note  Patient: [name]  Procedure(s) Performed: COLONOSCOPY WITH PROPOFOL POLYPECTOMY  Patient location during evaluation: PACU Anesthesia Type: General Level of consciousness: awake and alert, oriented and patient cooperative Pain management: pain level controlled Vital Signs Assessment: post-procedure vital signs reviewed and stable Respiratory status: spontaneous breathing, nonlabored ventilation and respiratory function stable Cardiovascular status: blood pressure returned to baseline and stable Postop Assessment: adequate PO intake Anesthetic complications: no   There were no known notable events for this encounter.   Last Vitals:  Vitals:   03/04/23 1110 03/04/23 1120  BP: 100/74 100/75  Pulse: 86   Resp:    Temp:    SpO2: 98%     Last Pain:  Vitals:   03/04/23 1120  TempSrc:   PainSc: 0-No pain                 Reed Breech

## 2023-03-04 NOTE — Op Note (Signed)
[facility] Gastroenterology Patient Name: [name] Procedure Date: 03/04/2023 10:40 AM MRN: [id number] Account #: [id number] Date of Birth: 24-Mar-1976 Admit Type: Outpatient Age: 47 Room: [facility] ENDO ROOM 2 Gender: Male Note Status: Finalized Instrument Name: Prentice Docker [id number] Procedure:             Colonoscopy Indications:           Rectal bleeding Providers:             [name] MD, MD Referring MD:          Karie Schwalbe (Referring MD) Medicines:             Monitored Anesthesia Care Complications:         No immediate complications. Procedure:             Pre-Anesthesia Assessment:                        - Prior to the procedure, a History and Physical was                         performed, and patient medications, allergies and                         sensitivities were reviewed. The patient's tolerance                         of previous anesthesia was reviewed.                        - The risks and benefits of the procedure and the                         sedation options and risks were discussed with the                         patient. All questions were answered and informed                         consent was obtained.                        - ASA Grade Assessment: II - A patient with mild                         systemic disease.                        After obtaining informed consent, the colonoscope was                         passed under direct vision. Throughout the procedure,                         the patient's blood pressure, pulse, and oxygen                         saturations were monitored continuously. The                         Colonoscope was introduced through the anus  and                         advanced to the the cecum, identified by the                         appendiceal orifice. The colonoscopy was performed                         with ease. The patient tolerated the procedure well.                         The  quality of the bowel preparation was excellent.                         The ileocecal valve, appendiceal orifice, and rectum                         were photographed. Findings:      The perianal and digital rectal examinations were normal.      A 3 mm polyp was found in the transverse colon. The polyp was sessile.       The polyp was removed with a jumbo cold forceps. Resection and retrieval       were complete.      Non-bleeding internal hemorrhoids were found during retroflexion. The       hemorrhoids were medium-sized and Grade I (internal hemorrhoids that do       not prolapse).      The exam was otherwise without abnormality on direct and retroflexion       views. Impression:            - One 3 mm polyp in the transverse colon, removed with                         a jumbo cold forceps. Resected and retrieved.                        - Non-bleeding internal hemorrhoids.                        - The examination was otherwise normal on direct and                         retroflexion views. Recommendation:        - Discharge patient to home (with escort).                        - Resume previous diet.                        - Continue present medications.                        - Await pathology results.                        - Repeat colonoscopy for surveillance based on                         pathology results. Procedure Code(s):     ---  Professional ---                        [phone], Colonoscopy, flexible; with biopsy, single or                         multiple Diagnosis Code(s):     --- Professional ---                        D12.3, Benign neoplasm of transverse colon (hepatic                         flexure or splenic flexure)                        K64.0, First degree hemorrhoids                        K62.5, Hemorrhage of anus and rectum CPT copyright 2022 American Medical Association. All rights reserved. The codes documented in this report are preliminary and upon coder  review may  be revised to meet current compliance requirements. [name], MD [name] MD, MD 03/04/2023 10:59:37 AM This report has been signed electronically. Number of Addenda: 0 Note Initiated On: 03/04/2023 10:40 AM Scope Withdrawal Time: 0 hours 9 minutes 28 seconds  Total Procedure Duration: 0 hours 11 minutes 57 seconds  Estimated Blood Loss:  Estimated blood loss: none.      [facility]

## 2023-03-04 NOTE — Anesthesia Preprocedure Evaluation (Addendum)
Anesthesia Evaluation  Patient identified by MRN, date of birth, ID band Patient awake    Reviewed: Allergy & Precautions, NPO status , Patient's Chart, lab work & pertinent test results  History of Anesthesia Complications (+) PONV and history of anesthetic complications  Airway Mallampati: I   Neck ROM: Full    Dental  (+) Missing   Pulmonary neg pulmonary ROS   Pulmonary exam normal breath sounds clear to auscultation       Cardiovascular hypertension, + Past MI and +CHF  + Cardiac Defibrillator + Valvular Problems/Murmurs (VSD s/p repair; s/p AVR x2)  Rhythm:Regular Rate:Normal + Systolic murmurs    Neuro/Psych  PSYCHIATRIC DISORDERS Anxiety     HOH    GI/Hepatic negative GI ROS,,,  Endo/Other  Obesity   Renal/GU negative Renal ROS     Musculoskeletal   Abdominal   Peds  Hematology negative hematology ROS (+)   Anesthesia Other Findings Cardiology note 01/09/23:  ASSESSMENT:    1. H/O aortic valve replacement  2. Long term (current) use of anticoagulants  3. Ischemic cardiomyopathy  4. Sustained ventricular tachycardia (HCC)  5. Essential hypertension    PLAN:    In order of problems listed above:   1. Patient with normal valve function on his most recent echo checked postoperatively.  Continue current management.  With an On-X valve he is managed with low-dose aspirin and warfarin. 2. Tolerating warfarin with a lower goal INR in the setting of an On-X valve per guidelines. 3. LVEF modestly improved following surgery at 35 to 40%.  Patient's only symptom is generalized fatigue.  I advised him to increase Entresto to 49/51 mg twice daily and otherwise continue on his current regimen which includes spironolactone, carvedilol, and Jardiance. 4. Doing well with no recent ICD discharges.  Followed regularly by EP. 5. Blood pressure well-controlled on current regimen.  Hopefully he will tolerate the increased  Entresto dose.  We discussed potential side effects.   Reproductive/Obstetrics                             Anesthesia Physical Anesthesia Plan  ASA: 3  Anesthesia Plan: General   Post-op Pain Management:    Induction: Intravenous  PONV Risk Score and Plan: 3 and Propofol infusion, TIVA and Treatment may vary due to age or medical condition  Airway Management Planned: Natural Airway  Additional Equipment:   Intra-op Plan:   Post-operative Plan:   Informed Consent: I have reviewed the patients History and Physical, chart, labs and discussed the procedure including the risks, benefits and alternatives for the proposed anesthesia with the patient or authorized representative who has indicated his/her understanding and acceptance.       Plan Discussed with: CRNA  Anesthesia Plan Comments: (LMA/GETA backup discussed.  Patient consented for risks of anesthesia including but not limited to:  - adverse reactions to medications - damage to eyes, teeth, lips or other oral mucosa - nerve damage due to positioning  - sore throat or hoarseness - damage to heart, brain, nerves, lungs, other parts of body or loss of life  Informed patient about role of CRNA in peri- and intra-operative care.  Patient voiced understanding.)        Anesthesia Quick Evaluation

## 2023-03-04 NOTE — Telephone Encounter (Signed)
Following alert received from [employer] received for ICD alert received. Antitachycardia pacing (ATP) therapy delivered to convert arrhythmia. Marland Kitchen 1 VT classified event on 02/28/23 at 00:48, 57 sec duration. EGM c/w VT treated unsuccessfully with ATP x 4 Bursts.  Arrhythmia self terminated. Average V rate 206 bpm. Analysis limited by RV only EGM.  Patient reports of palpitations while he was sleeping. Did stop coumadin 02/27/23 d/t rectal bleeding (that was the only change he is able to think of.) Compliant with coreg 6.25 mg BID. Patient advised of [place] DMV driving restrictions x6 months and shock plan.  Routing to Dr. Gerilyn Pilgrim who saw patient last. Pt also states he will need a note about driving restrictions for work.

## 2023-03-04 NOTE — Transfer of Care (Signed)
Immediate Anesthesia Transfer of Care Note  Patient: [name]  Procedure(s) Performed: COLONOSCOPY WITH PROPOFOL POLYPECTOMY  Patient Location: PACU  Anesthesia Type:General  Level of Consciousness: drowsy  Airway & Oxygen Therapy: Patient Spontanous Breathing and Patient connected to nasal cannula oxygen  Post-op Assessment: Report given to RN and Post -op Vital signs reviewed and stable  Post vital signs: stable  Last Vitals:  Vitals Value Taken Time  BP    Temp    Pulse 88 03/04/23 1100  Resp 23 03/04/23 1100  SpO2 98 % 03/04/23 1100  Vitals shown include unfiled device data.  Last Pain:  Vitals:   03/04/23 1007  TempSrc: Temporal  PainSc: 0-No pain         Complications: No notable events documented.

## 2023-03-05 ENCOUNTER — Encounter: Payer: Self-pay | Admitting: Gastroenterology

## 2023-03-05 LAB — SURGICAL PATHOLOGY

## 2023-03-06 NOTE — Telephone Encounter (Signed)
Please see note about patient needing apt.

## 2023-03-07 ENCOUNTER — Encounter: Payer: Self-pay | Admitting: Gastroenterology

## 2023-03-13 ENCOUNTER — Ambulatory Visit: Payer: 59 | Attending: Cardiovascular Disease

## 2023-03-13 DIAGNOSIS — Z952 Presence of prosthetic heart valve: Secondary | ICD-10-CM

## 2023-03-13 DIAGNOSIS — Z7901 Long term (current) use of anticoagulants: Secondary | ICD-10-CM | POA: Diagnosis not present

## 2023-03-13 DIAGNOSIS — I350 Nonrheumatic aortic (valve) stenosis: Secondary | ICD-10-CM | POA: Diagnosis not present

## 2023-03-13 LAB — POCT INR: INR: 1.5 — AB (ref 2.0–3.0)

## 2023-03-13 NOTE — Patient Instructions (Signed)
Continue taking warfarin 1.5 tablets daily. Recheck INR in 6 weeks.  Be consistent with green (2 times per week)  [facility] [phone] 

## 2023-03-19 ENCOUNTER — Other Ambulatory Visit: Payer: Self-pay

## 2023-03-19 ENCOUNTER — Telehealth: Payer: Self-pay | Admitting: Internal Medicine

## 2023-03-19 MED ORDER — SPIRONOLACTONE 25 MG PO TABS: 25.0000 mg | ORAL_TABLET | Freq: Every day | ORAL | 1 refills | Status: DC

## 2023-03-19 NOTE — Telephone Encounter (Signed)
Refill sent to pharmacy of choice. 

## 2023-03-19 NOTE — Telephone Encounter (Signed)
*  STAT* If patient is at the pharmacy, call can be transferred to refill team.   1. Which medications need to be refilled? (please list name of each medication and dose if known) Spironolactone   2. Would you like to learn more about the convenience, safety, & potential cost savings by using the [facility]?     3. Are you open to using the [facility] (Type [facility].   4. Which pharmacy/location (including street and city if local pharmacy) is medication to be sent to? [facility] [address] [place]   5. Do they need a 30 day or 90 day supply? 90 days and refills-please call in today- out of medicine

## 2023-03-21 ENCOUNTER — Ambulatory Visit: Payer: 59 | Attending: Internal Medicine | Admitting: Internal Medicine

## 2023-03-21 ENCOUNTER — Encounter: Payer: Self-pay | Admitting: Internal Medicine

## 2023-03-21 VITALS — BP 118/78 | HR 80 | Ht 70.0 in | Wt 213.6 lb

## 2023-03-21 DIAGNOSIS — I5020 Unspecified systolic (congestive) heart failure: Secondary | ICD-10-CM

## 2023-03-21 DIAGNOSIS — Z95810 Presence of automatic (implantable) cardiac defibrillator: Secondary | ICD-10-CM | POA: Diagnosis not present

## 2023-03-21 LAB — CUP PACEART INCLINIC DEVICE CHECK
Date Time Interrogation Session: 20241031131314
HighPow Impedance: 38 Ohm
Implantable Lead Connection Status: 753985
Implantable Lead Implant Date: 20080214
Implantable Lead Location: 753860
Implantable Lead Model: 185
Implantable Lead Serial Number: 178017
Implantable Pulse Generator Implant Date: 20141216
Lead Channel Setting Pacing Amplitude: 2 V
Lead Channel Setting Pacing Pulse Width: 0.4 ms
Lead Channel Setting Sensing Sensitivity: 0.6 mV
Pulse Gen Serial Number: 126781

## 2023-03-21 NOTE — Patient Instructions (Signed)
Medication Instructions:  The current medical regimen is effective;  continue present plan and medications.  *If you need a refill on your cardiac medications before your next appointment, please call your pharmacy*   Follow-Up: At [facility], you and your health needs are our priority.  As part of our continuing mission to provide you with exceptional heart care, we have created designated Provider Care Teams.  These Care Teams include your primary Cardiologist (physician) and Advanced Practice Providers (APPs -  Physician Assistants and Nurse Practitioners) who all work together to provide you with the care you need, when you need it.  We recommend signing up for the patient portal called "MyChart".  Sign up information is provided on this After Visit Summary.  MyChart is used to connect with patients for Virtual Visits (Telemedicine).  Patients are able to view lab/test results, encounter notes, upcoming appointments, etc.  Non-urgent messages can be sent to your provider as well.   To learn more about what you can do with MyChart, go to [email or web address].    Your next appointment:   6 month(s)  Provider:   Sherryl Manges, MD

## 2023-03-21 NOTE — Progress Notes (Signed)
Patient Care Team: Karie Schwalbe, MD as PCP - General (Internal Medicine) Tonny Bollman, MD as PCP - Cardiology (Cardiology) Duke Salvia, MD as PCP - Electrophysiology (Cardiology) Kerman Passey, MD as Attending Physician (Family Medicine) Duke Salvia, MD as Consulting Physician (Cardiology)   HPI  [name] is a 47 y.o. male Seen in followup for ventricular tachycardia occurring in the context of surgically corrected congenital heart disease With Aortic valve replacement.  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  My note from 2011 reports that at the time of surgery "he had damage to his right coronary artery and underwent repair." He is status post ICD implantation. Device generator replacement 12/14.  Underwent open aortic valve replacement 12/22 ([facility]); discussions prior to that as to whether he should have VT mapping at the same time, these happened for weeks prior to his surgery but ultimately Came to final resolution the week of.  Quite frustrating for the patient understandably  Overall better.  Less dyspnea.  Has noted his heart rate has been faster.  Following discharge, his afterload therapies were discontinued.  Gradually resumed.  Recently down titrated by Dr. [facility] because of lightheadedness and fatigue.  Now better.  No chest pain or edema  4/13 had appropriate shock for fast ventricular tachycardia He underwent   12/17  Admitted [facility] for syncopal VT- PM  Antecedent VT PM in wake of viral illness  Ranexa and MAG added He has had interval VTNS monomorphic and polymorphic  2/20 --abrupt onset of presyncope prompting him to sit on the floor and about 15 seconds the episode had resolved.  Device interrogation demonstrates rapid monomorphic VT with acceleration with ATP and then termination spontaneously at the moment of charging being  completed.  The shock was diverted.    Intercurrent AFib-paroxysmal   Referred 2/21 to [facility] for consideration of VT ablation -briefly summarized as normal endocardial voltage.  Thought to be epicardial.  Mapping of the CS was complicated by dissection.  VTs were hemodynamically unstable and " Given the inability to activation map due to hemodynamically unstable VT and inability to access the epicardial space, the case was concluded at this time." With recommendations for antiarrhythmic therapy     Interval ventricular tachycardia treated with ATP X4  and self termination prior to shock therapy.  No symptoms.  Cardiovascular symptoms are stable.  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  Not sleeping.  Early MN awakening irritability   DATE TEST EF   3/13    Echo 40-45 % IP akinesis   1/15    TEE  60 % "mechanical aortic valve"  2/17 Echo 45-50%   12/17 Echo  35-40% "bioprosthesis "  5/17 Echo  30-35%   8/18 CT 30-35% RCA Total without flow//aneurysm Inferior Wall LAD & CX w FFR > 80%  9/21 Echo  35-40% Bioprosthesis -- AoV mean grad 33  1/23 Echo  30-35% AoV mean grad 8 mm  7/24 Echo  35-40%        Antiarrhythmics Date Reason stopped  Ranolazine 12/17     Date Cr K Hgb  12/18 1.24 4.3    11/19 1.14 3.8   9/20 1.17 4.5   9/21 1.25 4.8 14  1/23 1.17 4.0 7.5      DATE TEST    6/18  HOLTER <1 % PVCs              Past Medical History:  Diagnosis Date   AICD (automatic cardioverter/defibrillator) present    Anxiety    Aortic valve replaced    Requiring replacement, specifics not available   Complication of anesthesia    N & V   Dysrhythmia    Hearing loss of both ears 12/22/2015   Heart murmur    Hypertension    IBD (inflammatory bowel disease) 09/23/2015   ICD (implantable cardiac defibrillator), BSX single    Myocardial infarction [facility])    Obesity 12/22/2015   PONV (postoperative nausea and vomiting)    Secondary  cardiomyopathy (HCC)    Syncope    Ventricular septal defect    Ventricular tachycardia (HCC)    appropriate VT shock therapy /13    Past Surgical History:  Procedure Laterality Date   ANGIOPLASTY     RCA repair with vein angioplasty following injury with the aforementioned surgery   AORTIC VALVE REPLACEMENT     Bioprosthesis   AORTIC VALVE REPLACEMENT N/A 04/27/2021   Procedure: REDO AORTIC VALVE REPLACEMENT (AVR) USING ON-X AORTIC VALVE;  Surgeon: Alleen Borne, MD;  Location: [facility] OR;  Service: Open Heart Surgery;  Laterality: N/A;   CARDIAC CATHETERIZATION     CARDIAC DEFIBRILLATOR PLACEMENT     CARDIAC VALVE REPLACEMENT     CHOLECYSTECTOMY     COLONOSCOPY WITH PROPOFOL N/A 12/06/2015   Procedure: COLONOSCOPY WITH PROPOFOL;  Surgeon: Midge Minium, MD;  Location: [facility] ENDOSCOPY;  Service: Endoscopy;  Laterality: N/A;   COLONOSCOPY WITH PROPOFOL N/A 03/04/2023   Procedure: COLONOSCOPY WITH PROPOFOL;  Surgeon: [name], MD;  Location: [facility] ENDOSCOPY;  Service: Gastroenterology;  Laterality: N/A;   CORONARY ANGIOPLASTY     CORONARY ARTERY BYPASS GRAFT     IMPLANTABLE CARDIOVERTER DEFIBRILLATOR GENERATOR CHANGE N/A 05/05/2013   Procedure: IMPLANTABLE CARDIOVERTER DEFIBRILLATOR GENERATOR CHANGE;  Surgeon: Duke Salvia, MD;  Location: [facility] CATH LAB;  Service: Cardiovascular;  Laterality: N/A;   INSERT / REPLACE / REMOVE PACEMAKER     POLYPECTOMY  03/04/2023   Procedure: POLYPECTOMY;  Surgeon: [name], MD;  Location: [facility] ENDOSCOPY;  Service: Gastroenterology;;   RIGHT/LEFT HEART CATH AND CORONARY ANGIOGRAPHY N/A 03/01/2021   Procedure: RIGHT/LEFT HEART CATH AND CORONARY ANGIOGRAPHY;  Surgeon: Tonny Bollman, MD;  Location: [facility] INVASIVE CV LAB;  Service: Cardiovascular;  Laterality: N/A;   TEE WITHOUT CARDIOVERSION N/A 04/27/2021   Procedure: TRANSESOPHAGEAL ECHOCARDIOGRAM (TEE);  Surgeon: Alleen Borne, MD;  Location: [facility] OR;  Service: Open Heart Surgery;  Laterality: N/A;    VSD REPAIR      Current Outpatient Medications  Medication Sig Dispense Refill   acetaminophen (TYLENOL) 500 MG tablet Take 500 mg by mouth every 6 (six) hours as needed for moderate pain.     amoxicillin (AMOXIL) 500 MG capsule Take 4 capsules by mouth one hour prior to dental exam 12 capsule 3   aspirin EC 81 MG EC tablet Take 1 tablet (81 mg total) by mouth daily. Swallow whole. 30 tablet 11   carvedilol (COREG) 6.25 MG tablet Take 1 tablet (6.25 mg total) by mouth 2 (two) times daily with a meal. 180 tablet 3   empagliflozin (JARDIANCE) 10 MG TABS tablet Take 1 tablet (10 mg total) by mouth daily before breakfast. 90 tablet 3   MAGOX 400 400 (240 Mg) MG tablet TAKE 1 TABLET TWICE A DAY 180 tablet 3   meclizine (ANTIVERT) 25  MG tablet Take 0.5-1 tablets (12.5-25 mg total) by mouth 3 (three) times daily as needed for dizziness. 30 tablet 2   Multiple Vitamin (MULTIVITAMIN) tablet Take 2 tablets by mouth daily.     ranolazine (RANEXA) 1000 MG SR tablet TAKE 1 TABLET TWICE A DAY 180 tablet 3   sacubitril-valsartan (ENTRESTO) 49-51 MG Take 1 tablet by mouth 2 (two) times daily. 180 tablet 3   spironolactone (ALDACTONE) 25 MG tablet Take 1 tablet (25 mg total) by mouth daily. 90 tablet 1   warfarin (COUMADIN) 5 MG tablet TAKE 1 AND 1/2 TABLETS BY MOUTH DAILY OR AS DIRECTED BY THE COUMADIN CLINIC 150 tablet 1   loratadine (CLARITIN) 10 MG tablet Take by mouth as needed. (Patient not taking: Reported on 03/21/2023)     ondansetron (ZOFRAN-ODT) 4 MG disintegrating tablet Take by mouth as needed. (Patient not taking: Reported on 03/21/2023)     No current facility-administered medications for this visit.    Allergies  Allergen Reactions   Shellfish Allergy Nausea And Vomiting    Review of Systems negative except from HPI and PMH  Physical Exam BP 118/78 (BP Location: Left Arm, Patient Position: Sitting, Cuff Size: Normal)   Pulse 80   Ht 5\' 10"  (1.778 m)   Wt 213 lb 9.6 oz (96.9 kg)    SpO2 98%   BMI 30.65 kg/m  Well developed and well nourished in no acute distress HENT normal Neck supple with JVP-flat Clear Device pocket well healed; without hematoma or erythema.  There is no tethering  Regular rate and rhythm, no  gallop 2/6 murmur mechanical S2 Abd-soft with active BS No Clubbing cyanosis  edema Skin-warm and dry A & Oriented  Grossly normal sensory and motor function  ECG sinus @ 80 17/14/41 LBBB Premature PVCs  Device function is normal. Programming changes none  See Paceart for details    Assessment and plan  Ventricular tachycardia-recurrent failed ATP 10/24  Hypertension   Aortic valve-mechanical 12/22   ICD-[employer]    Cardiomyopathy-hypertrophic/ischemic-secondary  PVCs-infrequent  Left bundle branch block new (progressive for minute mild IVCD)  Significant stress   Recurrent ventricular tachycardia with failed ATP; first interval episode in some time; so we will watch prior to initiating further therapy.  He would not be a candidate for epicardial ablation.  I wonder whether pulsed field ablation as a potential for him as it may allow a deeper lesion delivered endocardially.  Will plan to refer him to Dr. Mindi Junker sun at [facility] just was to be connected with that service again in the event that more ventricular tachycardia unfolds and impressed that her technologies would be available  Otherwise we will keep his GDMT the same.  Also talked about the importance of stress management.  Discussed the role of bitterness, anger manifesting as irritability and poor sleep suggestive of depression and worth thinking about addressing this.  He will consider and let us know   He has had mild prolongation of his IVCD moving from the 130 ms range to the 140s milliseconds range with now a more prominent left bundle branch block pattern.  This may reflect progressive cardiomyopathy I will keep our eye on this.  His symptoms at some point may justify  upgrading his device to a CRT depending on the stability of his QRS widening

## 2023-04-24 ENCOUNTER — Ambulatory Visit: Payer: 59 | Attending: Cardiovascular Disease

## 2023-04-24 DIAGNOSIS — I350 Nonrheumatic aortic (valve) stenosis: Secondary | ICD-10-CM

## 2023-04-24 DIAGNOSIS — Z7901 Long term (current) use of anticoagulants: Secondary | ICD-10-CM

## 2023-04-24 DIAGNOSIS — Z952 Presence of prosthetic heart valve: Secondary | ICD-10-CM | POA: Diagnosis not present

## 2023-04-24 LAB — POCT INR: INR: 2 (ref 2.0–3.0)

## 2023-04-24 NOTE — Patient Instructions (Signed)
Continue taking warfarin 1.5 tablets daily. Recheck INR in 6 weeks.  Be consistent with green (2 times per week)  [facility] [phone] 

## 2023-05-09 ENCOUNTER — Ambulatory Visit: Payer: 59

## 2023-05-09 DIAGNOSIS — I255 Ischemic cardiomyopathy: Secondary | ICD-10-CM | POA: Diagnosis not present

## 2023-05-09 LAB — CUP PACEART REMOTE DEVICE CHECK
Battery Remaining Longevity: 42 mo
Battery Remaining Percentage: 45 %
Brady Statistic RV Percent Paced: 0 %
Date Time Interrogation Session: 20241219030000
HighPow Impedance: 54 Ohm
Implantable Lead Connection Status: 753985
Implantable Lead Implant Date: 20080214
Implantable Lead Location: 753860
Implantable Lead Model: 185
Implantable Lead Serial Number: 178017
Implantable Pulse Generator Implant Date: 20141216
Lead Channel Impedance Value: 662 Ohm
Lead Channel Pacing Threshold Amplitude: 1 V
Lead Channel Pacing Threshold Pulse Width: 0.4 ms
Lead Channel Setting Pacing Amplitude: 2 V
Lead Channel Setting Pacing Pulse Width: 0.4 ms
Lead Channel Setting Sensing Sensitivity: 0.6 mV
Pulse Gen Serial Number: 126781

## 2023-06-05 ENCOUNTER — Ambulatory Visit: Payer: 59 | Attending: Cardiovascular Disease

## 2023-06-05 DIAGNOSIS — Z952 Presence of prosthetic heart valve: Secondary | ICD-10-CM | POA: Diagnosis not present

## 2023-06-05 DIAGNOSIS — I350 Nonrheumatic aortic (valve) stenosis: Secondary | ICD-10-CM | POA: Diagnosis not present

## 2023-06-05 DIAGNOSIS — Z7901 Long term (current) use of anticoagulants: Secondary | ICD-10-CM | POA: Diagnosis not present

## 2023-06-05 LAB — POCT INR: INR: 2 (ref 2.0–3.0)

## 2023-06-05 NOTE — Patient Instructions (Signed)
Continue taking warfarin 1.5 tablets daily. Recheck INR in 6 weeks.  Be consistent with green (2 times per week)  [facility] [phone] 

## 2023-06-10 ENCOUNTER — Other Ambulatory Visit: Payer: Self-pay | Admitting: Internal Medicine

## 2023-06-10 ENCOUNTER — Encounter: Payer: Self-pay | Admitting: Internal Medicine

## 2023-06-10 ENCOUNTER — Ambulatory Visit: Payer: 59 | Admitting: Internal Medicine

## 2023-06-10 VITALS — BP 104/68 | HR 63 | Temp 97.9°F | Ht 70.0 in | Wt 221.0 lb

## 2023-06-10 DIAGNOSIS — R1084 Generalized abdominal pain: Secondary | ICD-10-CM | POA: Diagnosis not present

## 2023-06-10 MED ORDER — ONDANSETRON 4 MG PO TBDP: 4.0000 mg | ORAL_TABLET | ORAL | 0 refills | Status: DC | PRN

## 2023-06-10 NOTE — Progress Notes (Signed)
Subjective:    Patient ID: [name], male    DOB: 1975/11/20, 48 y.o.   MRN: [id number]  HPI Here due to GI symptoms  Having some abdominal cramping and pain Severe at times-- 10/10 at times (but only for 1 minute or less) Persists even without eating Started 2 days ago  Slight nausea --no vomiting. Didn't take the ondansetron Still eating and drinking normally No BM in 2 days--not unusual  Current Outpatient Medications on File Prior to Visit  Medication Sig Dispense Refill   acetaminophen (TYLENOL) 500 MG tablet Take 500 mg by mouth every 6 (six) hours as needed for moderate pain.     amoxicillin (AMOXIL) 500 MG capsule Take 4 capsules by mouth one hour prior to dental exam 12 capsule 3   aspirin EC 81 MG EC tablet Take 1 tablet (81 mg total) by mouth daily. Swallow whole. 30 tablet 11   carvedilol (COREG) 6.25 MG tablet Take 1 tablet (6.25 mg total) by mouth 2 (two) times daily with a meal. 180 tablet 3   empagliflozin (JARDIANCE) 10 MG TABS tablet Take 1 tablet (10 mg total) by mouth daily before breakfast. 90 tablet 3   loratadine (CLARITIN) 10 MG tablet Take by mouth as needed.     MAGOX 400 400 (240 Mg) MG tablet TAKE 1 TABLET TWICE A DAY 180 tablet 3   meclizine (ANTIVERT) 25 MG tablet Take 0.5-1 tablets (12.5-25 mg total) by mouth 3 (three) times daily as needed for dizziness. 30 tablet 2   Multiple Vitamin (MULTIVITAMIN) tablet Take 2 tablets by mouth daily.     ondansetron (ZOFRAN-ODT) 4 MG disintegrating tablet Take by mouth as needed.     ranolazine (RANEXA) 1000 MG SR tablet TAKE 1 TABLET TWICE A DAY 180 tablet 3   sacubitril-valsartan (ENTRESTO) 49-51 MG Take 1 tablet by mouth 2 (two) times daily. 180 tablet 3   spironolactone (ALDACTONE) 25 MG tablet Take 1 tablet (25 mg total) by mouth daily. 90 tablet 1   warfarin (COUMADIN) 5 MG tablet TAKE 1 AND 1/2 TABLETS BY MOUTH DAILY OR AS DIRECTED BY THE COUMADIN CLINIC 150 tablet 1   No current facility-administered  medications on file prior to visit.    Allergies  Allergen Reactions   Shellfish Allergy Nausea And Vomiting    Past Medical History:  Diagnosis Date   AICD (automatic cardioverter/defibrillator) present    Anxiety    Aortic valve replaced    Requiring replacement, specifics not available   Complication of anesthesia    N & V   Dysrhythmia    Hearing loss of both ears 12/22/2015   Heart murmur    Hypertension    IBD (inflammatory bowel disease) 09/23/2015   ICD (implantable cardiac defibrillator), BSX single    Myocardial infarction [facility])    Obesity 12/22/2015   PONV (postoperative nausea and vomiting)    Secondary cardiomyopathy (HCC)    Syncope    Ventricular septal defect    Ventricular tachycardia (HCC)    appropriate VT shock therapy /13    Past Surgical History:  Procedure Laterality Date   ANGIOPLASTY     RCA repair with vein angioplasty following injury with the aforementioned surgery   AORTIC VALVE REPLACEMENT     Bioprosthesis   AORTIC VALVE REPLACEMENT N/A 04/27/2021   Procedure: REDO AORTIC VALVE REPLACEMENT (AVR) USING ON-X AORTIC VALVE;  Surgeon: Alleen Borne, MD;  Location: [facility] OR;  Service: Open Heart Surgery;  Laterality: N/A;  CARDIAC CATHETERIZATION     CARDIAC DEFIBRILLATOR PLACEMENT     CARDIAC VALVE REPLACEMENT     CHOLECYSTECTOMY     COLONOSCOPY WITH PROPOFOL N/A 12/06/2015   Procedure: COLONOSCOPY WITH PROPOFOL;  Surgeon: Midge Minium, MD;  Location: [facility] ENDOSCOPY;  Service: Endoscopy;  Laterality: N/A;   COLONOSCOPY WITH PROPOFOL N/A 03/04/2023   Procedure: COLONOSCOPY WITH PROPOFOL;  Surgeon: [name], MD;  Location: [facility] ENDOSCOPY;  Service: Gastroenterology;  Laterality: N/A;   CORONARY ANGIOPLASTY     CORONARY ARTERY BYPASS GRAFT     IMPLANTABLE CARDIOVERTER DEFIBRILLATOR GENERATOR CHANGE N/A 05/05/2013   Procedure: IMPLANTABLE CARDIOVERTER DEFIBRILLATOR GENERATOR CHANGE;  Surgeon: Duke Salvia, MD;  Location: [facility] CATH LAB;   Service: Cardiovascular;  Laterality: N/A;   INSERT / REPLACE / REMOVE PACEMAKER     POLYPECTOMY  03/04/2023   Procedure: POLYPECTOMY;  Surgeon: [name], MD;  Location: [facility] ENDOSCOPY;  Service: Gastroenterology;;   RIGHT/LEFT HEART CATH AND CORONARY ANGIOGRAPHY N/A 03/01/2021   Procedure: RIGHT/LEFT HEART CATH AND CORONARY ANGIOGRAPHY;  Surgeon: Tonny Bollman, MD;  Location: [facility] INVASIVE CV LAB;  Service: Cardiovascular;  Laterality: N/A;   TEE WITHOUT CARDIOVERSION N/A 04/27/2021   Procedure: TRANSESOPHAGEAL ECHOCARDIOGRAM (TEE);  Surgeon: Alleen Borne, MD;  Location: [facility] OR;  Service: Open Heart Surgery;  Laterality: N/A;   VSD REPAIR      Family History  Problem Relation Age of Onset   Heart disease Father    Hypertension Maternal Grandmother    Cancer Maternal Grandmother    Hypertension Maternal Grandfather    Cancer Maternal Grandfather     Social History   Socioeconomic History   Marital status: Married    Spouse name: [name]   Number of children: 2   Years of education: 17   Highest education level: Bachelor's degree (e.g., BA, AB, BS)  Occupational History   Occupation: IT    Employer: [employer]  Tobacco Use   Smoking status: Never    Passive exposure: Past   Smokeless tobacco: Never  Vaping Use   Vaping status: Never Used  Substance and Sexual Activity   Alcohol use: No   Drug use: No   Sexual activity: Yes    Partners: Female  Other Topics Concern   Not on file  Social History Narrative   Has living will   Wife is health care POA--alternate is sister [name]   Would accept resuscitation    No feeding tube if persistent cognitively unaware   Social Drivers of Health   Financial Resource Strain: Low Risk  (02/17/2020)   Overall Financial Resource Strain (CARDIA)    Difficulty of Paying Living Expenses: Not hard at all  Food Insecurity: No Food Insecurity (02/17/2020)   Hunger Vital Sign    Worried About Running Out of Food in the Last  Year: Never true    Ran Out of Food in the Last Year: Never true  Transportation Needs: No Transportation Needs (02/17/2020)   PRAPARE - Administrator, Civil Service (Medical): No    Lack of Transportation (Non-Medical): No  Physical Activity: Inactive (02/17/2020)   Exercise Vital Sign    Days of Exercise per Week: 0 days    Minutes of Exercise per Session: 0 min  Stress: No Stress Concern Present (02/17/2020)   [employer] of Occupational Health - Occupational Stress Questionnaire    Feeling of Stress : Not at all  Social Connections: Moderately Integrated (02/17/2020)   Social Connection and Isolation Panel [NHANES]  Frequency of Communication with Friends and Family: Twice a week    Frequency of Social Gatherings with Friends and Family: Twice a week    Attends Religious Services: 1 to 4 times per year    Active Member of [employer] or Organizations: No    Attends Banker Meetings: Never    Marital Status: Married  Catering manager Violence: Not At Risk (02/17/2020)   Humiliation, Afraid, Rape, and Kick questionnaire    Fear of Current or Ex-Partner: No    Emotionally Abused: No    Physically Abused: No    Sexually Abused: No   Review of Systems No cough or SOB No fever No recent travel--other than to [place] (visited friends--but no ill exposures)    Objective:   Physical Exam Constitutional:      Appearance: He is well-developed.  Cardiovascular:     Rate and Rhythm: Normal rate and regular rhythm.     Heart sounds: No murmur heard.    No gallop.  Pulmonary:     Effort: Pulmonary effort is normal.     Breath sounds: Normal breath sounds. No wheezing or rales.  Abdominal:     General: Bowel sounds are normal.     Palpations: Abdomen is soft. There is no mass.     Tenderness: There is no abdominal tenderness. There is no guarding or rebound.  Musculoskeletal:     Cervical back: Neck supple.     Right lower leg: No edema.     Left lower  leg: No edema.  Lymphadenopathy:     Cervical: No cervical adenopathy.  Neurological:     Mental Status: He is alert.            Assessment & Plan:

## 2023-06-10 NOTE — Telephone Encounter (Signed)
Pharmacy comment: Script Clarification:NEEDS TO BE MORE SPECIFIC THAN AS NEEDED, HOW MANY TIMES PER DAY?

## 2023-06-10 NOTE — Telephone Encounter (Signed)
Pt was seen in the office after this message.

## 2023-06-10 NOTE — Assessment & Plan Note (Signed)
No worrisome features on history or exam Still eating fine Started after trip to [place]--doesn't seem to be infection, but might be change in bowels (hasn't gone since) Will refill ondansetron Try miralax for bowels ER if really worsens--consider CT if persistent

## 2023-06-11 NOTE — Telephone Encounter (Signed)
 error

## 2023-06-13 NOTE — Progress Notes (Signed)
Remote ICD transmission.   

## 2023-06-15 ENCOUNTER — Other Ambulatory Visit: Payer: Self-pay | Admitting: Cardiology

## 2023-06-17 NOTE — Telephone Encounter (Signed)
Last office visit: 03/21/23 with plan to f/u 6 months. next office visit: none/active recall

## 2023-07-02 ENCOUNTER — Other Ambulatory Visit: Payer: Self-pay | Admitting: Cardiovascular Disease

## 2023-07-10 ENCOUNTER — Ambulatory Visit: Payer: 59 | Admitting: Cardiovascular Disease

## 2023-07-17 ENCOUNTER — Ambulatory Visit: Payer: 59 | Attending: Cardiovascular Disease

## 2023-07-17 DIAGNOSIS — Z7901 Long term (current) use of anticoagulants: Secondary | ICD-10-CM | POA: Diagnosis not present

## 2023-07-17 DIAGNOSIS — I350 Nonrheumatic aortic (valve) stenosis: Secondary | ICD-10-CM

## 2023-07-17 DIAGNOSIS — Z952 Presence of prosthetic heart valve: Secondary | ICD-10-CM | POA: Diagnosis not present

## 2023-07-17 LAB — POCT INR: INR: 1.7 — AB (ref 2.0–3.0)

## 2023-07-17 NOTE — Patient Instructions (Signed)
Continue taking warfarin 1.5 tablets daily. Recheck INR in 6 weeks.  Be consistent with green (2 times per week)  [facility] [phone] 

## 2023-08-08 ENCOUNTER — Ambulatory Visit: Payer: 59

## 2023-08-08 DIAGNOSIS — I255 Ischemic cardiomyopathy: Secondary | ICD-10-CM | POA: Diagnosis not present

## 2023-08-09 LAB — CUP PACEART REMOTE DEVICE CHECK
Battery Remaining Longevity: 42 mo
Battery Remaining Percentage: 42 %
Brady Statistic RV Percent Paced: 0 %
Date Time Interrogation Session: 20250320030100
HighPow Impedance: 58 Ohm
Implantable Lead Connection Status: 753985
Implantable Lead Implant Date: 20080214
Implantable Lead Location: 753860
Implantable Lead Model: 185
Implantable Lead Serial Number: 178017
Implantable Pulse Generator Implant Date: 20141216
Lead Channel Impedance Value: 662 Ohm
Lead Channel Pacing Threshold Amplitude: 1 V
Lead Channel Pacing Threshold Pulse Width: 0.4 ms
Lead Channel Setting Pacing Amplitude: 2 V
Lead Channel Setting Pacing Pulse Width: 0.4 ms
Lead Channel Setting Sensing Sensitivity: 0.6 mV
Pulse Gen Serial Number: 126781

## 2023-08-28 ENCOUNTER — Ambulatory Visit: Attending: Cardiovascular Disease

## 2023-08-28 ENCOUNTER — Ambulatory Visit: Payer: 59

## 2023-08-28 DIAGNOSIS — I350 Nonrheumatic aortic (valve) stenosis: Secondary | ICD-10-CM

## 2023-08-28 DIAGNOSIS — Z952 Presence of prosthetic heart valve: Secondary | ICD-10-CM | POA: Diagnosis not present

## 2023-08-28 DIAGNOSIS — Z7901 Long term (current) use of anticoagulants: Secondary | ICD-10-CM | POA: Diagnosis not present

## 2023-08-28 LAB — POCT INR: INR: 2.1 (ref 2.0–3.0)

## 2023-08-28 NOTE — Patient Instructions (Signed)
Continue taking warfarin 1.5 tablets daily. Recheck INR in 6 weeks.  Be consistent with green (2 times per week)  [facility] [phone] 

## 2023-08-29 ENCOUNTER — Ambulatory Visit: Admitting: Internal Medicine

## 2023-08-29 ENCOUNTER — Encounter: Payer: Self-pay | Admitting: Internal Medicine

## 2023-08-29 VITALS — BP 112/80 | HR 76 | Temp 97.8°F | Ht 70.0 in | Wt 221.0 lb

## 2023-08-29 DIAGNOSIS — H6123 Impacted cerumen, bilateral: Secondary | ICD-10-CM

## 2023-08-29 DIAGNOSIS — H6120 Impacted cerumen, unspecified ear: Secondary | ICD-10-CM | POA: Insufficient documentation

## 2023-08-29 NOTE — Progress Notes (Signed)
 Subjective:    Patient ID: [name], male    DOB: 07/17/1975, 48 y.o.   MRN: [id number]  HPI Here due to right ear trouble  Has pressure sensation in right ear Hearing is off Seems worse in the morning Started about 3 weeks ago  No recent fever or respiratory symptoms No recent travel Current Outpatient Medications on File Prior to Visit  Medication Sig Dispense Refill   acetaminophen (TYLENOL) 500 MG tablet Take 500 mg by mouth every 6 (six) hours as needed for moderate pain.     amoxicillin (AMOXIL) 500 MG capsule Take 4 capsules by mouth one hour prior to dental exam 12 capsule 3   aspirin EC 81 MG EC tablet Take 1 tablet (81 mg total) by mouth daily. Swallow whole. 30 tablet 11   carvedilol (COREG) 6.25 MG tablet Take 1 tablet (6.25 mg total) by mouth 2 (two) times daily with a meal. 180 tablet 3   empagliflozin (JARDIANCE) 10 MG TABS tablet Take 1 tablet (10 mg total) by mouth daily before breakfast. 90 tablet 3   MAGOX 400 400 (240 Mg) MG tablet TAKE 1 TABLET TWICE A DAY 180 tablet 3   meclizine (ANTIVERT) 25 MG tablet Take 0.5-1 tablets (12.5-25 mg total) by mouth 3 (three) times daily as needed for dizziness. 30 tablet 2   Multiple Vitamin (MULTIVITAMIN) tablet Take 2 tablets by mouth daily.     ondansetron (ZOFRAN-ODT) 4 MG disintegrating tablet Take 1 tablet (4 mg total) by mouth 3 (three) times daily as needed. 20 tablet 0   ranolazine (RANEXA) 1000 MG SR tablet TAKE 1 TABLET TWICE A DAY 180 tablet 3   sacubitril-valsartan (ENTRESTO) 49-51 MG Take 1 tablet by mouth 2 (two) times daily. 180 tablet 3   spironolactone (ALDACTONE) 25 MG tablet TAKE 1 TABLET (25 MG TOTAL) BY MOUTH DAILY. 90 tablet 0   warfarin (COUMADIN) 5 MG tablet TAKE ONE AND ONE-HALF TABLETS DAILY OR AS DIRECTED BY THE COUMADIN CLINIC 150 tablet 3   loratadine (CLARITIN) 10 MG tablet Take by mouth as needed. (Patient not taking: Reported on 08/29/2023)     No current facility-administered medications  on file prior to visit.    Allergies  Allergen Reactions   Shellfish Allergy Nausea And Vomiting    Past Medical History:  Diagnosis Date   AICD (automatic cardioverter/defibrillator) present    Anxiety    Aortic valve replaced    Requiring replacement, specifics not available   Complication of anesthesia    N & V   Dysrhythmia    Hearing loss of both ears 12/22/2015   Heart murmur    Hypertension    IBD (inflammatory bowel disease) 09/23/2015   ICD (implantable cardiac defibrillator), BSX single    Myocardial infarction [facility])    Obesity 12/22/2015   PONV (postoperative nausea and vomiting)    Secondary cardiomyopathy (HCC)    Syncope    Ventricular septal defect    Ventricular tachycardia (HCC)    appropriate VT shock therapy /13    Past Surgical History:  Procedure Laterality Date   ANGIOPLASTY     RCA repair with vein angioplasty following injury with the aforementioned surgery   AORTIC VALVE REPLACEMENT     Bioprosthesis   AORTIC VALVE REPLACEMENT N/A 04/27/2021   Procedure: REDO AORTIC VALVE REPLACEMENT (AVR) USING ON-X AORTIC VALVE;  Surgeon: Alleen Borne, MD;  Location: [facility] OR;  Service: Open Heart Surgery;  Laterality: N/A;   CARDIAC CATHETERIZATION  CARDIAC DEFIBRILLATOR PLACEMENT     CARDIAC VALVE REPLACEMENT     CHOLECYSTECTOMY     COLONOSCOPY WITH PROPOFOL N/A 12/06/2015   Procedure: COLONOSCOPY WITH PROPOFOL;  Surgeon: Midge Minium, MD;  Location: [facility] ENDOSCOPY;  Service: Endoscopy;  Laterality: N/A;   COLONOSCOPY WITH PROPOFOL N/A 03/04/2023   Procedure: COLONOSCOPY WITH PROPOFOL;  Surgeon: [name], MD;  Location: [facility] ENDOSCOPY;  Service: Gastroenterology;  Laterality: N/A;   CORONARY ANGIOPLASTY     CORONARY ARTERY BYPASS GRAFT     IMPLANTABLE CARDIOVERTER DEFIBRILLATOR GENERATOR CHANGE N/A 05/05/2013   Procedure: IMPLANTABLE CARDIOVERTER DEFIBRILLATOR GENERATOR CHANGE;  Surgeon: Duke Salvia, MD;  Location: [facility] CATH LAB;  Service:  Cardiovascular;  Laterality: N/A;   INSERT / REPLACE / REMOVE PACEMAKER     POLYPECTOMY  03/04/2023   Procedure: POLYPECTOMY;  Surgeon: [name], MD;  Location: [facility] ENDOSCOPY;  Service: Gastroenterology;;   RIGHT/LEFT HEART CATH AND CORONARY ANGIOGRAPHY N/A 03/01/2021   Procedure: RIGHT/LEFT HEART CATH AND CORONARY ANGIOGRAPHY;  Surgeon: Tonny Bollman, MD;  Location: [facility] INVASIVE CV LAB;  Service: Cardiovascular;  Laterality: N/A;   TEE WITHOUT CARDIOVERSION N/A 04/27/2021   Procedure: TRANSESOPHAGEAL ECHOCARDIOGRAM (TEE);  Surgeon: Alleen Borne, MD;  Location: [facility] OR;  Service: Open Heart Surgery;  Laterality: N/A;   VSD REPAIR      Family History  Problem Relation Age of Onset   Heart disease Father    Hypertension Maternal Grandmother    Cancer Maternal Grandmother    Hypertension Maternal Grandfather    Cancer Maternal Grandfather     Social History   Socioeconomic History   Marital status: Married    Spouse name: [name]   Number of children: 2   Years of education: 17   Highest education level: Bachelor's degree (e.g., BA, AB, BS)  Occupational History   Occupation: IT    Employer: [employer]  Tobacco Use   Smoking status: Never    Passive exposure: Past   Smokeless tobacco: Never  Vaping Use   Vaping status: Never Used  Substance and Sexual Activity   Alcohol use: No   Drug use: No   Sexual activity: Yes    Partners: Female  Other Topics Concern   Not on file  Social History Narrative   Has living will   Wife is health care POA--alternate is sister [name]   Would accept resuscitation    No feeding tube if persistent cognitively unaware   Social Drivers of Health   Financial Resource Strain: Low Risk  (02/17/2020)   Overall Financial Resource Strain (CARDIA)    Difficulty of Paying Living Expenses: Not hard at all  Food Insecurity: No Food Insecurity (02/17/2020)   Hunger Vital Sign    Worried About Running Out of Food in the Last Year: Never  true    Ran Out of Food in the Last Year: Never true  Transportation Needs: No Transportation Needs (02/17/2020)   PRAPARE - Administrator, Civil Service (Medical): No    Lack of Transportation (Non-Medical): No  Physical Activity: Inactive (02/17/2020)   Exercise Vital Sign    Days of Exercise per Week: 0 days    Minutes of Exercise per Session: 0 min  Stress: No Stress Concern Present (02/17/2020)   [employer] of Occupational Health - Occupational Stress Questionnaire    Feeling of Stress : Not at all  Social Connections: Moderately Integrated (02/17/2020)   Social Connection and Isolation Panel [NHANES]    Frequency of Communication  with Friends and Family: Twice a week    Frequency of Social Gatherings with Friends and Family: Twice a week    Attends Religious Services: 1 to 4 times per year    Active Member of [employer] or Organizations: No    Attends Banker Meetings: Never    Marital Status: Married  Catering manager Violence: Not At Risk (02/17/2020)   Humiliation, Afraid, Rape, and Kick questionnaire    Fear of Current or Ex-Partner: No    Emotionally Abused: No    Physically Abused: No    Sexually Abused: No    Review of Systems Occasional tinnitus--not related to current problems No vertigo    Objective:   Physical Exam Constitutional:      Appearance: Normal appearance.  HENT:     Ears:     Comments: Both TMs are occluded due to cerumen Neurological:     Mental Status: He is alert.            Assessment & Plan:

## 2023-08-29 NOTE — Assessment & Plan Note (Addendum)
 Both ears but only the hearing loss in right Will flush out----cleared but still didn't feel right Re-examination showed basically clear canals, ?slight retraction but without fluid/inflammation bilaterally Discussed home Rx with peroxide solution and flushes prn  Will wait a few days---if still having hearing issues will refer to ENT for exam and audiology

## 2023-09-02 ENCOUNTER — Encounter: Payer: Self-pay | Admitting: Internal Medicine

## 2023-09-19 NOTE — Addendum Note (Signed)
 Addended by: Lott Rouleau A on: 09/19/2023 01:40 PM   Modules accepted: Orders

## 2023-09-19 NOTE — Progress Notes (Signed)
 Remote ICD transmission.

## 2023-10-09 ENCOUNTER — Ambulatory Visit: Attending: Cardiovascular Disease

## 2023-10-09 DIAGNOSIS — Z952 Presence of prosthetic heart valve: Secondary | ICD-10-CM

## 2023-10-09 DIAGNOSIS — Z7901 Long term (current) use of anticoagulants: Secondary | ICD-10-CM

## 2023-10-09 DIAGNOSIS — I350 Nonrheumatic aortic (valve) stenosis: Secondary | ICD-10-CM

## 2023-10-09 LAB — POCT INR: INR: 1.8 — AB (ref 2.0–3.0)

## 2023-10-09 NOTE — Patient Instructions (Signed)
Description   Continue taking warfarin 1.5 tablets daily. Recheck INR in 6 weeks.  Be consistent with green (2 times per week)  Coumadin Clinic [phone]

## 2023-10-12 ENCOUNTER — Other Ambulatory Visit: Payer: Self-pay | Admitting: Cardiovascular Disease

## 2023-10-17 ENCOUNTER — Ambulatory Visit: Attending: Cardiology | Admitting: Cardiology

## 2023-10-17 ENCOUNTER — Encounter: Payer: Self-pay | Admitting: Cardiology

## 2023-10-17 VITALS — BP 106/70 | HR 77 | Ht 70.0 in | Wt 216.8 lb

## 2023-10-17 DIAGNOSIS — Z7901 Long term (current) use of anticoagulants: Secondary | ICD-10-CM

## 2023-10-17 DIAGNOSIS — I350 Nonrheumatic aortic (valve) stenosis: Secondary | ICD-10-CM | POA: Diagnosis not present

## 2023-10-17 DIAGNOSIS — I5020 Unspecified systolic (congestive) heart failure: Secondary | ICD-10-CM

## 2023-10-17 DIAGNOSIS — Z952 Presence of prosthetic heart valve: Secondary | ICD-10-CM

## 2023-10-17 DIAGNOSIS — I10 Essential (primary) hypertension: Secondary | ICD-10-CM | POA: Diagnosis not present

## 2023-10-17 DIAGNOSIS — Z8679 Personal history of other diseases of the circulatory system: Secondary | ICD-10-CM | POA: Diagnosis not present

## 2023-10-17 MED ORDER — ENTRESTO 49-51 MG PO TABS: 1.0000 | ORAL_TABLET | Freq: Two times a day (BID) | ORAL | 3 refills | Status: AC

## 2023-10-17 NOTE — Progress Notes (Signed)
 " Cardiology Office Note:   Date:  10/18/2023  ID:  DELMA VILLALVA, DOB April 20, 1976, MRN [id number] PCP: Jimmy Charlie FERNS, MD  [facility] Providers Cardiologist:  Ozell Fell, MD Electrophysiologist:  Elspeth Sage, MD    History of Present Illness:   Discussed the use of AI scribe software for clinical note transcription with the patient, who gave verbal consent to proceed.  History of Present Illness RASHAD AULD is a 48 year old male with history of valvular heart disease, congestive heart failure, ventricular tachycardia, paroxysmal atrial fibrillation, and hypertension . He presents for a follow-up visit.  Patient underwent VSD repair at age 31 at [facility] in [place]. He then had surgical AVR with bioprosthetic valve. During this procedure, he suffered incidental injury to his RCA resulting in an inferior infarct. Patient later began having ventricular tachycardia in 2008 and received ICD. Patient with progressive bioprosthetic valve dysfunction with severe LV dysfunction and had redo surgical aortic valve replacement in 2022 (23 mm On-X mechanical valve).   Patient saw Dr. Sage in October 2024 where it was noted that he had recurrent ventricular tachycardia with failed ATP as well as mild prolongation of intraventricular conduction delay with widening QRS from 130 ms to 140 ms. Patient and Dr. Sage discussed possible referral to [facility] for further management. However, he has not yet seen anyone at [facility] for further evaluation given stable device interrogations.   Today patient reports that he still experiences generalized fatigue though tolerates walking daily. He is compliant with low dose aspirin  and warfarin. His left ventricular ejection fraction (LVEF) following the most recent surgery was 35-40%. His medications include Entresto  49/51 mg twice daily, spironolactone , carvedilol , and Jardiance .  He has no recent issues with dizziness, lightheadedness, or  exertional intolerance. He walks about two and a half miles but avoids lifting heavy objects. He is interested in weight training. No bleeding, chest discomfort reported.   He has been experiencing symptoms of a cold or allergies for about three weeks, with no significant sinus issues or postnasal drip. He has not been using Flonase  or other sinus medications. His blood pressure is usually around 100-115/70-80 mmHg.  He had an issue with getting his Entresto  prescription filled at [facility] due to a mix-up with [facility], but he has enough medication to last through the month. He is due for a physical next month with his primary care provider.   Today patient denies chest pain, shortness of breath, lower extremity edema, palpitations, melena, hematuria, hemoptysis, diaphoresis, weakness, presyncope, syncope, orthopnea, and PND.   Studies Reviewed:    EKG:   EKG Interpretation Date/Time:  Thursday Oct 17 2023 15:26:01 EDT Ventricular Rate:  77 PR Interval:  210 QRS Duration:  144 QT Interval:  436 QTC Calculation: 493 R Axis:   52  Text Interpretation: Sinus rhythm with 1st degree A-V block with frequent Premature ventricular complexes Left bundle branch block When compared with ECG of 21-Mar-2023 11:01, PR interval has increased Confirmed by Trudy Birmingham [phone]) on 10/17/2023 3:28:15 PM    Risk Assessment/Calculations:              Physical Exam:   VS:  BP 106/70   Pulse 77   Ht 5' 10 (1.778 m)   Wt 216 lb 12.8 oz (98.3 kg)   SpO2 95%   BMI 31.11 kg/m    Wt Readings from Last 3 Encounters:  10/17/23 216 lb 12.8 oz (98.3 kg)  08/29/23 221 lb (  100.2 kg)  06/10/23 221 lb (100.2 kg)     Physical Exam Vitals reviewed.  Constitutional:      Appearance: Normal appearance.  HENT:     Head: Normocephalic.  Eyes:     Pupils: Pupils are equal, round, and reactive to light.  Cardiovascular:     Rate and Rhythm: Normal rate and regular rhythm.     Pulses: Normal  pulses.     Heart sounds: Normal heart sounds.     Friction rub: crisp valve closure.  Pulmonary:     Effort: Pulmonary effort is normal.     Breath sounds: Normal breath sounds.  Abdominal:     General: Abdomen is flat.     Palpations: Abdomen is soft.  Musculoskeletal:     Right lower leg: No edema.     Left lower leg: No edema.  Skin:    General: Skin is warm and dry.     Capillary Refill: Capillary refill takes less than 2 seconds.  Neurological:     General: No focal deficit present.     Mental Status: He is alert and oriented to person, place, and time.  Psychiatric:        Mood and Affect: Mood normal.        Behavior: Behavior normal.        Thought Content: Thought content normal.        Judgment: Judgment normal.       ASSESSMENT AND PLAN:    Assessment & Plan Ventricular tachycardia S/P ICD. Recurrent ventricular tachycardia with failed ATP last year (subsequently saw Dr. Fernande). Mild prolongation of intraventricular conduction delay with QRS widening from 130 ms to 140 ms also noted at that time. Recent device interrogations are stable. QRS today stable.  - Will ensure appropriate device clinic/EP follow up.   Congestive heart failure Well-managed on Entresto , spironolactone , carvedilol , and Jardiance . LVEF post-surgery is 35-40%. No exertional intolerance or heart failure symptoms. Typical blood pressure readings are 100-115/70-80. - Continue current heart failure medications. - Order basic metabolic panel and complete blood count   Aortic valve replacement Status post redo median sternotomy with 23 mm Onyx mechanical valve in 2022. Stable function on 2024 TTE. No new symptoms.  - Order ultrasound for valve assessment later this summer - Continue ASA and Warfarin  General Health Maintenance Due for a physical next month with PCP. Plans to have labs drawn during this visit. - Coordinate with PCP for upcoming physical and lab work.              Signed, Artist Pouch, PA-C   "

## 2023-10-17 NOTE — Patient Instructions (Addendum)
 Medication Instructions:  ENTRESTO  HAS BEEN REFILLED AND SENT TO YOUR PHARMACY *If you need a refill on your cardiac medications before your next appointment, please call your pharmacy*  Lab Work: CBC, BMET, MAG. TODAY  If you have labs (blood work) drawn today and your tests are completely normal, you will receive your results only by: MyChart Message (if you have MyChart) OR A paper copy in the mail If you have any lab test that is abnormal or we need to change your treatment, we will call you to review the results.  Testing/Procedures: Your physician has requested that you have an echocardiogram. Echocardiography is a painless test that uses sound waves to create images of your heart. It provides your doctor with information about the size and shape of your heart and how well your heart's chambers and valves are working. This procedure takes approximately one hour. There are no restrictions for this procedure. Please do NOT wear cologne, perfume, aftershave, or lotions (deodorant is allowed). Please arrive 15 minutes prior to your appointment time.  Please note: We ask at that you not bring children with you during ultrasound (echo/ vascular) testing. Due to room size and safety concerns, children are not allowed in the ultrasound rooms during exams. Our front office staff cannot provide observation of children in our lobby area while testing is being conducted. An adult accompanying a patient to their appointment will only be allowed in the ultrasound room at the discretion of the ultrasound technician under special circumstances. We apologize for any inconvenience.   Follow-Up: At [facility], you and your health needs are our priority.  As part of our continuing mission to provide you with exceptional heart care, our providers are all part of one team.  This team includes your primary Cardiologist (physician) and Advanced Practice Providers or APPs (Physician Assistants and Nurse  Practitioners) who all work together to provide you with the care you need, when you need it.  Your next appointment:    1) FOLLOW UP WITH DR. Marven Slimmer ON JUNE 4TH AT 8:20 AM, PLEASE ARRIVE 15 MINUTES BEFORE YOUR SCHEDULED APPOINTMENT.    2) 6 MONTHS WITH DR. Arlester Ladd

## 2023-10-18 ENCOUNTER — Ambulatory Visit: Payer: Self-pay | Admitting: Cardiology

## 2023-10-18 LAB — CBC
Hematocrit: 44.2 % (ref 37.5–51.0)
Hemoglobin: 14.6 g/dL (ref 13.0–17.7)
MCH: 30.2 pg (ref 26.6–33.0)
MCHC: 33 g/dL (ref 31.5–35.7)
MCV: 92 fL (ref 79–97)
Platelets: 206 10*3/uL (ref 150–450)
RBC: 4.83 x10E6/uL (ref 4.14–5.80)
RDW: 11.9 % (ref 11.6–15.4)
WBC: 4.6 10*3/uL (ref 3.4–10.8)

## 2023-10-18 LAB — BASIC METABOLIC PANEL WITH GFR
BUN/Creatinine Ratio: 14 (ref 9–20)
BUN: 18 mg/dL (ref 6–24)
CO2: 21 mmol/L (ref 20–29)
Calcium: 9.2 mg/dL (ref 8.7–10.2)
Chloride: 105 mmol/L (ref 96–106)
Creatinine, Ser: 1.31 mg/dL — ABNORMAL HIGH (ref 0.76–1.27)
Glucose: 96 mg/dL (ref 70–99)
Potassium: 4.4 mmol/L (ref 3.5–5.2)
Sodium: 140 mmol/L (ref 134–144)
eGFR: 67 mL/min/{1.73_m2} (ref >59)

## 2023-10-18 LAB — MAGNESIUM: Magnesium: 2.2 mg/dL (ref 1.6–2.3)

## 2023-10-22 NOTE — Progress Notes (Unsigned)
  Electrophysiology Office Follow up Visit Note:    Date:  10/22/2023   ID:  [name], DOB April 19, 1976, MRN [id number]  PCP:  Helaine Llanos, MD  [facility] Cardiologist:  Arnoldo Lapping, MD  [facility] Electrophysiologist:  Richardo Chandler, MD    Interval History:     [name] is a 48 y.o. male who presents for a follow up visit.   He was last seen by Dr [name] 03/21/2023. He has a history of AVR. He has an ICD implanted and had a generator replacement in 2014. His hx includes VT. He had a prior falied VT ablation at [facility]. He was referred back to Dr [name] at [facility] to reestablish care with their clinic. Upgrade to CRTD has been discussed/considered in the past but not performed.  He takes ranexa  for AAD.   He has been doing well.  No syncope.  No chest pain or shortness of breath.       Past medical, surgical, social and family history were reviewed.  ROS:   Please see the history of present illness.    All other systems reviewed and are negative.  EKGs/Labs/Other Studies Reviewed:    The following studies were reviewed today:  12/05/2022 Echo EF 35 RV normal Mechanical AVR  10/23/2023 in clinic device interrogation personally reviewed Battery longevity 3 years Adjusted V output to 2.5 Stable lead parameters 2 short NSVT episodes since last remote  Voltage therapies have been delivered since last remote  10/17/2023 ECG shows sinus LBBB, frequent PVC's.. QRS duration .  10/12/2022 ECG shows sinus, LBBB, QRS duration .         Physical Exam:    VS:  There were no vitals taken for this visit.    Wt Readings from Last 3 Encounters:  10/17/23 216 lb 12.8 oz (98.3 kg)  08/29/23 221 lb (100.2 kg)  06/10/23 221 lb (100.2 kg)     GEN: no distress CARD: RRR, No MRG. ICD pocket well healed. RESP: No IWOB. CTAB.      ASSESSMENT:    1. H/O aortic valve replacement   2. HFrEF (heart failure with reduced ejection fraction) (HCC)   3.  History of ventricular tachycardia   4. Cardiac defibrillator in situ    PLAN:    In order of problems listed above:  #HFrEF #Hx of AVR #ICD in situ NYHA II. Warm and dry. Last EF 35%. Repeat echo planned. ICD functioning appropriately, continue remote monitoring.   CRT upgrade again discussed during today's visit.  I discussed the rationale behind CRT-D upgrade.  Given the borderline QRS duration and his functional status, I do not think I would proceed with CRT upgrade at this time.  I have recommended that we closely monitor his symptoms and EKG.  If he were to have any change in symptoms, would move up the timeline.  Otherwise, at the time of generator replacement, would plan for CRT upgrade.   Follow up 1 year with APP.    Signed, Fernando Liner, MD, [facility], [facility] 10/22/2023 9:12 PM    Electrophysiology [facility]

## 2023-10-23 ENCOUNTER — Encounter: Payer: Self-pay | Admitting: Cardiology

## 2023-10-23 ENCOUNTER — Ambulatory Visit: Attending: Cardiology | Admitting: Cardiology

## 2023-10-23 VITALS — BP 130/72 | HR 64 | Ht 70.0 in | Wt 213.4 lb

## 2023-10-23 DIAGNOSIS — Z952 Presence of prosthetic heart valve: Secondary | ICD-10-CM

## 2023-10-23 DIAGNOSIS — Z95810 Presence of automatic (implantable) cardiac defibrillator: Secondary | ICD-10-CM | POA: Diagnosis not present

## 2023-10-23 DIAGNOSIS — Z8679 Personal history of other diseases of the circulatory system: Secondary | ICD-10-CM

## 2023-10-23 DIAGNOSIS — I5020 Unspecified systolic (congestive) heart failure: Secondary | ICD-10-CM | POA: Diagnosis not present

## 2023-10-23 LAB — CUP PACEART INCLINIC DEVICE CHECK
Date Time Interrogation Session: 20250604115115
Implantable Lead Connection Status: 753985
Implantable Lead Implant Date: 20080214
Implantable Lead Location: 753860
Implantable Lead Model: 185
Implantable Lead Serial Number: 178017
Implantable Pulse Generator Implant Date: 20141216
Pulse Gen Serial Number: 126781

## 2023-10-23 NOTE — Patient Instructions (Signed)
 Medication Instructions:  Your physician recommends that you continue on your current medications as directed. Please refer to the Current Medication list given to you today.  *If you need a refill on your cardiac medications before your next appointment, please call your pharmacy*  Follow-Up: At [facility], you and your health needs are our priority.  As part of our continuing mission to provide you with exceptional heart care, our providers are all part of one team.  This team includes your primary Cardiologist (physician) and Advanced Practice Providers or APPs (Physician Assistants and Nurse Practitioners) who all work together to provide you with the care you need, when you need it.  Your next appointment:   1 year  Provider:   You may see Harvie Liner, MD or one of the following Advanced Practice Providers on your designated Care Team:   Mertha Abrahams, [place] [address] [place], PA-C Suzann Riddle, NP Creighton Doffing, NP

## 2023-10-26 ENCOUNTER — Ambulatory Visit: Payer: Self-pay | Admitting: Cardiology

## 2023-10-28 ENCOUNTER — Encounter: Payer: Self-pay | Admitting: Internal Medicine

## 2023-10-28 ENCOUNTER — Telehealth: Admitting: Physician Assistant

## 2023-10-28 DIAGNOSIS — R197 Diarrhea, unspecified: Secondary | ICD-10-CM | POA: Diagnosis not present

## 2023-10-28 NOTE — Telephone Encounter (Signed)
 Thank you :)

## 2023-10-28 NOTE — Telephone Encounter (Signed)
 Noted

## 2023-10-28 NOTE — Progress Notes (Signed)
E-Visit for Diarrhea  We are sorry that you are not feeling well.  Here is how we plan to help!  Based on what you have shared with me it looks like you have Acute Infectious Diarrhea.  Most cases of acute diarrhea are due to infections with virus and bacteria and are self-limited conditions lasting less than 14 days.  For your symptoms you may take Imodium 2 mg tablets that are over the counter at your local pharmacy. Take two tablet now and then one after each loose stool up to 6 a day.  Antibiotics are not needed for most people with diarrhea.  HOME CARE We recommend changing your diet to help with your symptoms for the next few days. Drink plenty of fluids that contain water salt and sugar. Sports drinks such as Gatorade may help.  You may try broths, soups, bananas, applesauce, soft breads, mashed potatoes or crackers.  You are considered infectious for as long as the diarrhea continues. Hand washing or use of alcohol based hand sanitizers is recommend. It is best to stay out of work or school until your symptoms stop.   GET HELP RIGHT AWAY If you have dark yellow colored urine or do not pass urine frequently you should drink more fluids.   If your symptoms worsen  If you feel like you are going to pass out (faint) You have a new problem  MAKE SURE YOU  Understand these instructions. Will watch your condition. Will get help right away if you are not doing well or get worse.  Thank you for choosing an e-visit.  Your e-visit answers were reviewed by a board certified advanced clinical practitioner to complete your personal care plan. Depending upon the condition, your plan could have included both over the counter or prescription medications.  Please review your pharmacy choice. Make sure the pharmacy is open so you can pick up prescription now. If there is a problem, you may contact your provider through [employer] and have the prescription routed to another pharmacy.   Your safety is important to [place]. If you have drug allergies check your prescription carefully.   For the next 24 hours you can use MyChart to ask questions about today's visit, request a non-urgent call back, or ask for a work or school excuse. You will get an email in the next two days asking about your experience. I hope that your e-visit has been valuable and will speed your recovery.  I have spent 5 minutes in review of e-visit questionnaire, review and updating patient chart, medical decision making and response to patient.   Mar Daring, PA-C

## 2023-10-28 NOTE — Telephone Encounter (Signed)
 I spoke with pt; starting on 10/27/23 pt has chlls and body aches. Starting on 10/28/23 7 AM started with watery diarrhea. Pt has had 6 watery stools today. Pt said on 10/27/23 ate [place] sausage with rice that he had not had before. No N&V, no abd pain, no cough, no fever today slight body aches.and diarrhea. Pt had evisit with [facility] earlier today and is taking imodium , drinking water and pt wanted to schedule appt at [facility]. Pt scheduled appt on 10/29/23 at 10:40 with B Deborra Falter NP with UC & ED precautions. If pt is not feeling better she will keep appt and since e visit today and eating BRAT diet if symptoms are gone on 10/29/23 pt will call early to cancel the appt. Sending note to B Deborra Falter NP ands [place] pool.

## 2023-10-29 ENCOUNTER — Ambulatory Visit: Admitting: General Practice

## 2023-11-07 ENCOUNTER — Ambulatory Visit (INDEPENDENT_AMBULATORY_CARE_PROVIDER_SITE_OTHER): Payer: 59

## 2023-11-07 DIAGNOSIS — I255 Ischemic cardiomyopathy: Secondary | ICD-10-CM | POA: Diagnosis not present

## 2023-11-07 LAB — CUP PACEART REMOTE DEVICE CHECK
Battery Remaining Longevity: 36 mo
Battery Remaining Percentage: 38 %
Brady Statistic RV Percent Paced: 0 %
Date Time Interrogation Session: 20250619030100
HighPow Impedance: 55 Ohm
Implantable Lead Connection Status: 753985
Implantable Lead Implant Date: 20080214
Implantable Lead Location: 753860
Implantable Lead Model: 185
Implantable Lead Serial Number: 178017
Implantable Pulse Generator Implant Date: 20141216
Lead Channel Impedance Value: 634 Ohm
Lead Channel Pacing Threshold Amplitude: 1 V
Lead Channel Pacing Threshold Pulse Width: 0.4 ms
Lead Channel Setting Pacing Amplitude: 2.5 V
Lead Channel Setting Pacing Pulse Width: 0.4 ms
Lead Channel Setting Sensing Sensitivity: 0.6 mV
Pulse Gen Serial Number: 126781

## 2023-11-08 ENCOUNTER — Ambulatory Visit: Payer: Self-pay | Admitting: Cardiology

## 2023-11-11 ENCOUNTER — Telehealth: Payer: Self-pay

## 2023-11-11 NOTE — Telephone Encounter (Addendum)
 Alert received from [employer]:  Alert remote transmission: ATP VT event on 6/22 at 1100 with V-rate 201 bpm treated with ATP x 1. Regular R-R that self terminates, two tachy events followed by ATP.  Sending to triage.  1 NSVT event x 6 sec.  Outreach made to Pt.  Pt states he did feel this episode (unclear if he felt the NSVT or the ATP).  Advised strip reviewed with EP physician.  It appears that the initial NSVT episode did break, but because the programming for initial detection was 5 seconds, Pt received ATP treatment.  Per Joey with BS, the normal programming for initial detection is at least 10 seconds.  Will have Pt come to [place] to reprogram initial detection to 10 seconds.    Pt requests a mychart message so he can determine a good time to come.  W

## 2023-11-25 ENCOUNTER — Other Ambulatory Visit: Payer: Self-pay | Admitting: Cardiovascular Disease

## 2023-11-27 ENCOUNTER — Ambulatory Visit

## 2023-11-29 ENCOUNTER — Encounter: Payer: 59 | Admitting: Internal Medicine

## 2023-12-01 ENCOUNTER — Other Ambulatory Visit: Payer: Self-pay | Admitting: Internal Medicine

## 2023-12-04 ENCOUNTER — Ambulatory Visit (HOSPITAL_COMMUNITY)
Admission: RE | Admit: 2023-12-04 | Discharge: 2023-12-04 | Disposition: A | Discharge status: Home / Self Care | Source: Ambulatory Visit | Attending: Cardiovascular Disease | Admitting: Cardiovascular Disease

## 2023-12-04 DIAGNOSIS — Z952 Presence of prosthetic heart valve: Secondary | ICD-10-CM | POA: Diagnosis present

## 2023-12-04 LAB — ECHOCARDIOGRAM COMPLETE
Area-P 1/2: 4.18 cm2
P 1/2 time: 294 ms
S' Lateral: 4.7 cm

## 2023-12-05 ENCOUNTER — Ambulatory Visit: Attending: Cardiology | Admitting: Cardiology

## 2023-12-05 ENCOUNTER — Encounter: Payer: Self-pay | Admitting: Cardiology

## 2023-12-05 VITALS — BP 110/62 | Ht 70.0 in | Wt 215.8 lb

## 2023-12-05 DIAGNOSIS — I5020 Unspecified systolic (congestive) heart failure: Secondary | ICD-10-CM | POA: Diagnosis not present

## 2023-12-05 DIAGNOSIS — Z8679 Personal history of other diseases of the circulatory system: Secondary | ICD-10-CM

## 2023-12-05 DIAGNOSIS — Z95810 Presence of automatic (implantable) cardiac defibrillator: Secondary | ICD-10-CM

## 2023-12-05 LAB — CUP PACEART INCLINIC DEVICE CHECK
Date Time Interrogation Session: 20250717165239
HighPow Impedance: 38 Ohm
HighPow Impedance: 53 Ohm
Implantable Lead Connection Status: 753985
Implantable Lead Implant Date: 20080214
Implantable Lead Location: 753860
Implantable Lead Model: 185
Implantable Lead Serial Number: 178017
Implantable Pulse Generator Implant Date: 20141216
Lead Channel Impedance Value: 589 Ohm
Lead Channel Pacing Threshold Amplitude: 1.2 V
Lead Channel Pacing Threshold Pulse Width: 0.4 ms
Lead Channel Sensing Intrinsic Amplitude: 15.4 mV
Lead Channel Setting Pacing Amplitude: 2.5 V
Lead Channel Setting Pacing Pulse Width: 0.4 ms
Lead Channel Setting Sensing Sensitivity: 0.6 mV
Pulse Gen Serial Number: 126781

## 2023-12-05 NOTE — Progress Notes (Signed)
 Electrophysiology Clinic Note    Date:  12/05/2023  Patient ID:  [name] 06-28-1975, MRN [id number] PCP:  Jimmy Charlie FERNS, MD  Cardiologist:  Ozell Fell, MD   Electrophysiologist:  Dr. Fernande > OLE ONEIDA HOLTS, MD  Electrophysiology APP:  Mackie Goon, NP    Discussed the use of AI scribe software for clinical note transcription with the patient, who gave verbal consent to proceed.   Patient Profile    Chief Complaint: ATP, device adjustments  History of Present Illness: [name] is a 48 y.o. male with PMH notable for ICM, PVCs, HFrEF, VT s/p ICD, parox AFib, HTN, anomalous RCA s/p repair, s/p AVR ; seen today for OLE ONEIDA HOLTS, MD for acute visit due to ATP.    As a teenager was found to have a VSD on a sports physical. He underwent surgical VSD repair at 48 years old [facility]). Was then found to have an aortic valve problem at age 60 and underwent bioprosthetic aortic valve replacement ([facility]). During that surgery there was an injury to the RCA and he had an infarct. Was found to have episodes of VT in 2008 and was treated with medical therapy and an ICD. Has had a few appropriate shocks for VT over the years and has had one ATP therapy delivered as well. Referral to [facility] for consideration of VT ablation, thought to be epicardial with mapping of the CS complicated by dissection, case aborted. Has experienced progressive bioprosthetic aortic valve dysfunction with severe prosthetic valve stenosis and moderately severe LV systolic dysfunction. Ultimately treated with third time redo sternotomy and AVR using a 23 mm ON-X mechanical valve on 04/27/2021. Post-op course uncomplicated.   He last saw Dr. HOLTS 10/2023, planning to upgrade device to CRT-D at time of next gen change.   Device clinic was alerted 10/2023 for ATP for NSVT, symptomatic but unsure whether patient was symptomatic of the NSVT or the ATP.   Patient today  tells me that he overall feels well. He denies chest pain, chest pressure. He has intermittent, brief palpitation episodes. The day following his NSVT episode requiring ATP he had another palpitaiton episodes.   He had updated TTE yesterday to follow his aortic valve.  He denies increased SOB, DOE, or edema.     Arrhythmia/Device History Bos Sci ICD, imp 06/2006; ICM, VT Gen change 04/2013   AAD -  Renexa    ROS:  Please see the history of present illness. All other systems are reviewed and otherwise negative.    Physical Exam    VS:  BP 110/62 (BP Location: Left Arm, Patient Position: Sitting, Cuff Size: Normal)   Ht 5' 10 (1.778 m)   Wt 215 lb 12.8 oz (97.9 kg)   SpO2 98%   BMI 30.96 kg/m  BMI: Body mass index is 30.96 kg/m.      Wt Readings from Last 3 Encounters:  12/05/23 215 lb 12.8 oz (97.9 kg)  10/23/23 213 lb 6.4 oz (96.8 kg)  10/17/23 216 lb 12.8 oz (98.3 kg)     GEN- The patient is well appearing, alert and oriented x 3 today.   Lungs- Clear to ausculation bilaterally, normal work of breathing.  Heart- Regular rate and rhythm, mechanical click appreciated Extremities- No peripheral edema, warm, dry Skin-  device pocket well-healed, no tethering   Device interrogation done today and reviewed by myself:  Battery 3 years Lead thresholds, impedence, sensing stable  VT episode 11/10/2023 where episode self-terminated, but device did not recognize the break in rhythm. ATP x 1 given Increased VT detection from 5 second to 10 second    Studies Reviewed   Previous EP, cardiology notes.    EKG is not ordered. Personal review of EKG from 10/17/2023 shows:  SR with 1st deg HB, freq PVCs        TTE, 12/04/2023  1. Left ventricular ejection fraction, by estimation, is 30 to 35%. The left ventricle has moderately decreased function. The left ventricle has no regional wall motion abnormalities. The left ventricular internal cavity size was mildly dilated. Left  ventricular diastolic parameters are consistent with Grade I diastolic dysfunction (impaired relaxation).   2. Right ventricular systolic function is mildly reduced. The right ventricular size is normal.   3. The mitral valve is normal in structure. No evidence of mitral valve regurgitation. No evidence of mitral stenosis.   4. The aortic valve has been repaired/replaced. Aortic valve regurgitation is not visualized. No aortic stenosis is present. There is a 23 mm mechanical valve present in the aortic position. Procedure Date: 04/2021. Echo findings are consistent with normal structure and function of the aortic valve prosthesis.   5. The inferior vena cava is normal in size with greater than 50% respiratory variability, suggesting right atrial pressure of 3 mmHg.   TTE, 06/07/2021 1. Left ventricular ejection fraction, by estimation, is 30 to 35%. The left ventricle has moderately decreased function. The left ventricle demonstrates regional wall motion abnormalities with severe anteroseptal, inferoseptal, and inferolateral hypokinesis and basal to mid inferior akinesis. The left ventricular internal cavity size was moderately dilated. There is mild left ventricular hypertrophy. Left ventricular diastolic parameters are consistent with Grade I diastolic dysfunction (impaired relaxation).   2. Right ventricular systolic function is mildly reduced. The right ventricular size is normal. Tricuspid regurgitation signal is inadequate for assessing PA pressure.   3. The mitral valve is normal in structure. No evidence of mitral valve regurgitation. No evidence of mitral stenosis.   4. Mechanical AVR (23 mm On-X valve). Mean gradient 8 mmHg, no significant stenosis. Trivial aortic insufficiency.   5. Left atrial size was mildly dilated.   6. The inferior vena cava is normal in size with greater than 50% respiratory variability, suggesting right atrial pressure of 3 mmHg.     Assessment and Plan     #)  VT #) Bos Sci ICD Device functioning well, rare NSVT episodes Adjusted VT detection window as above to limit ATP Rare NSVT episodes  Continue 1000mg  renexa BID  #) HFrEF Appears warm and dry on exam Updated LVEF on yesterday's TTE slightly reduced from 35-40 > 30-35% Planning to upgrade device at next generator change  Continue 6.25mg  coreg  BID, 10mg  jardiance , 49-51 entresto  BID, 25mg  spiro BP borderline, unable to increase GDMT       Current medicines are reviewed at length with the patient today.   The patient does not have concerns regarding his medicines.  The following changes were made today:  none  Labs/ tests ordered today include:  Orders Placed This Encounter  Procedures   EKG 12-Lead     Disposition: Follow up with Dr. Cindie or EP APP in 12 months   Signed, Cassidey Barrales, NP  12/05/23  3:47 PM  Electrophysiology [facility]

## 2023-12-05 NOTE — Patient Instructions (Signed)
 Medication Instructions:  Your physician recommends that you continue on your current medications as directed. Please refer to the Current Medication list given to you today.  *If you need a refill on your cardiac medications before your next appointment, please call your pharmacy*  Lab Work: No labs ordered today  If you have labs (blood work) drawn today and your tests are completely normal, you will receive your results only by: MyChart Message (if you have MyChart) OR A paper copy in the mail If you have any lab test that is abnormal or we need to change your treatment, we will call you to review the results.  Testing/Procedures: No test ordered today   Follow-Up: At [facility], you and your health needs are our priority.  As part of our continuing mission to provide you with exceptional heart care, our providers are all part of one team.  This team includes your primary Cardiologist (physician) and Advanced Practice Providers or APPs (Physician Assistants and Nurse Practitioners) who all work together to provide you with the care you need, when you need it.  Your next appointment:   1 year(s)  Provider:   Ole Holts, MD or Suzann Riddle, NP

## 2023-12-06 ENCOUNTER — Ambulatory Visit: Payer: Self-pay | Admitting: Cardiology

## 2023-12-11 ENCOUNTER — Encounter

## 2023-12-18 ENCOUNTER — Ambulatory Visit (INDEPENDENT_AMBULATORY_CARE_PROVIDER_SITE_OTHER): Admitting: Internal Medicine

## 2023-12-18 ENCOUNTER — Ambulatory Visit: Payer: Self-pay | Admitting: Internal Medicine

## 2023-12-18 ENCOUNTER — Ambulatory Visit: Attending: Cardiovascular Disease

## 2023-12-18 VITALS — BP 110/70 | HR 74 | Temp 98.0°F | Ht 69.5 in | Wt 210.0 lb

## 2023-12-18 DIAGNOSIS — I5020 Unspecified systolic (congestive) heart failure: Secondary | ICD-10-CM | POA: Diagnosis not present

## 2023-12-18 DIAGNOSIS — Z125 Encounter for screening for malignant neoplasm of prostate: Secondary | ICD-10-CM | POA: Diagnosis not present

## 2023-12-18 DIAGNOSIS — I472 Ventricular tachycardia, unspecified: Secondary | ICD-10-CM | POA: Diagnosis not present

## 2023-12-18 DIAGNOSIS — Z952 Presence of prosthetic heart valve: Secondary | ICD-10-CM

## 2023-12-18 DIAGNOSIS — Z0000 Encounter for general adult medical examination without abnormal findings: Secondary | ICD-10-CM | POA: Diagnosis not present

## 2023-12-18 DIAGNOSIS — Z7901 Long term (current) use of anticoagulants: Secondary | ICD-10-CM

## 2023-12-18 DIAGNOSIS — I350 Nonrheumatic aortic (valve) stenosis: Secondary | ICD-10-CM | POA: Diagnosis not present

## 2023-12-18 LAB — RENAL FUNCTION PANEL
Albumin: 4.4 g/dL (ref 3.5–5.2)
BUN: 17 mg/dL (ref 6–23)
CO2: 30 meq/L (ref 19–32)
Calcium: 9.3 mg/dL (ref 8.4–10.5)
Chloride: 102 meq/L (ref 96–112)
Creatinine, Ser: 1.27 mg/dL (ref 0.40–1.50)
GFR: 66.28 mL/min (ref >60.00)
Glucose, Bld: 89 mg/dL (ref 70–99)
Phosphorus: 2.9 mg/dL (ref 2.3–4.6)
Potassium: 4.3 meq/L (ref 3.5–5.1)
Sodium: 138 meq/L (ref 135–145)

## 2023-12-18 LAB — HEPATIC FUNCTION PANEL
ALT: 25 U/L (ref 0–53)
AST: 21 U/L (ref 0–37)
Albumin: 4.4 g/dL (ref 3.5–5.2)
Alkaline Phosphatase: 29 U/L — ABNORMAL LOW (ref 39–117)
Bilirubin, Direct: 0.2 mg/dL (ref 0.0–0.3)
Total Bilirubin: 0.9 mg/dL (ref 0.2–1.2)
Total Protein: 7.4 g/dL (ref 6.0–8.3)

## 2023-12-18 LAB — PSA: PSA: 1.01 ng/mL (ref 0.10–4.00)

## 2023-12-18 LAB — POCT INR: INR: 1.9 — AB (ref 2.0–3.0)

## 2023-12-18 LAB — LIPID PANEL
Cholesterol: 176 mg/dL (ref 0–200)
HDL: 40.1 mg/dL (ref >39.00)
LDL Cholesterol: 109 mg/dL — ABNORMAL HIGH (ref 0–99)
NonHDL: 135.45
Total CHOL/HDL Ratio: 4
Triglycerides: 130 mg/dL (ref 0.0–149.0)
VLDL: 26 mg/dL (ref 0.0–40.0)

## 2023-12-18 LAB — TSH: TSH: 1.89 u[IU]/mL (ref 0.35–5.50)

## 2023-12-18 LAB — CBC
HCT: 45.8 % (ref 39.0–52.0)
Hemoglobin: 15 g/dL (ref 13.0–17.0)
MCHC: 32.6 g/dL (ref 30.0–36.0)
MCV: 89.9 fl (ref 78.0–100.0)
Platelets: 189 K/uL (ref 150.0–400.0)
RBC: 5.1 Mil/uL (ref 4.22–5.81)
RDW: 13 % (ref 11.5–15.5)
WBC: 4.2 K/uL (ref 4.0–10.5)

## 2023-12-18 MED ORDER — HYDROCORTISONE 2.5 % EX CREA
TOPICAL_CREAM | Freq: Three times a day (TID) | CUTANEOUS | 3 refills | Status: AC | PRN
Start: 2023-12-18 — End: ?

## 2023-12-18 NOTE — Assessment & Plan Note (Signed)
 Healthy Discussed exercise Colon due in about 6 years again Will check PSA after discussion Still prefers no Flu/COVID vaccines

## 2023-12-18 NOTE — Assessment & Plan Note (Signed)
 On the warfarin

## 2023-12-18 NOTE — Progress Notes (Signed)
 Subjective:    Patient ID: [name], male    DOB: 12-31-1975, 48 y.o.   MRN: [id number]  HPI Here for physical  Doing well Heart is fine Does walking and yard work  Current Outpatient Medications on File Prior to Visit  Medication Sig Dispense Refill   acetaminophen  (TYLENOL ) 500 MG tablet Take 500 mg by mouth every 6 (six) hours as needed for moderate pain.     amoxicillin  (AMOXIL ) 500 MG capsule Take 4 capsules by mouth one hour prior to dental exam 12 capsule 3   aspirin  EC 81 MG EC tablet Take 1 tablet (81 mg total) by mouth daily. Swallow whole. 30 tablet 11   carvedilol  (COREG ) 6.25 MG tablet Take 1 tablet (6.25 mg total) by mouth 2 (two) times daily with a meal. 180 tablet 3   empagliflozin  (JARDIANCE ) 10 MG TABS tablet Take 1 tablet (10 mg total) by mouth daily before breakfast. 90 tablet 3   loratadine  (CLARITIN ) 10 MG tablet Take by mouth as needed.     MAGOX 400 400 (240 Mg) MG tablet TAKE 1 TABLET TWICE A DAY 180 tablet 3   meclizine  (ANTIVERT ) 25 MG tablet Take 0.5-1 tablets (12.5-25 mg total) by mouth 3 (three) times daily as needed for dizziness. 30 tablet 2   Multiple Vitamin (MULTIVITAMIN) tablet Take 2 tablets by mouth daily.     ondansetron  (ZOFRAN -ODT) 4 MG disintegrating tablet Take 1 tablet (4 mg total) by mouth 3 (three) times daily as needed. 20 tablet 0   ranolazine  (RANEXA ) 1000 MG SR tablet TAKE 1 TABLET TWICE A DAY 180 tablet 3   sacubitril -valsartan  (ENTRESTO ) 49-51 MG Take 1 tablet by mouth 2 (two) times daily. 180 tablet 3   spironolactone  (ALDACTONE ) 25 MG tablet TAKE 1 TABLET (25 MG TOTAL) BY MOUTH DAILY. 90 tablet 3   triamcinolone  cream (KENALOG ) 0.1 % Apply 1 Application topically as needed.     warfarin (COUMADIN ) 5 MG tablet TAKE ONE AND ONE-HALF TABLETS DAILY OR AS DIRECTED BY THE COUMADIN  CLINIC 150 tablet 3   No current facility-administered medications on file prior to visit.    Allergies  Allergen Reactions   Shellfish Allergy  Nausea And Vomiting    Past Medical History:  Diagnosis Date   AICD (automatic cardioverter/defibrillator) present    Anxiety    Aortic valve replaced    Requiring replacement, specifics not available   Complication of anesthesia    N & V   Dysrhythmia    Hearing loss of both ears 12/22/2015   Heart murmur    Hypertension    IBD (inflammatory bowel disease) 09/23/2015   ICD (implantable cardiac defibrillator), BSX single    Myocardial infarction [facility])    Obesity 12/22/2015   PONV (postoperative nausea and vomiting)    Secondary cardiomyopathy (HCC)    Syncope    Ventricular septal defect    Ventricular tachycardia (HCC)    appropriate VT shock therapy /13    Past Surgical History:  Procedure Laterality Date   ANGIOPLASTY     RCA repair with vein angioplasty following injury with the aforementioned surgery   AORTIC VALVE REPLACEMENT     Bioprosthesis   AORTIC VALVE REPLACEMENT N/A 04/27/2021   Procedure: REDO AORTIC VALVE REPLACEMENT (AVR) USING ON-X AORTIC VALVE;  Surgeon: Lucas Dorise POUR, MD;  Location: [facility] OR;  Service: Open Heart Surgery;  Laterality: N/A;   CARDIAC CATHETERIZATION     CARDIAC DEFIBRILLATOR PLACEMENT     CARDIAC  VALVE REPLACEMENT     CHOLECYSTECTOMY     COLONOSCOPY WITH PROPOFOL  N/A 12/06/2015   Procedure: COLONOSCOPY WITH PROPOFOL ;  Surgeon: Rogelia Copping, MD;  Location: [facility] ENDOSCOPY;  Service: Endoscopy;  Laterality: N/A;   COLONOSCOPY WITH PROPOFOL  N/A 03/04/2023   Procedure: COLONOSCOPY WITH PROPOFOL ;  Surgeon: Therisa Bi, MD;  Location: [facility] ENDOSCOPY;  Service: Gastroenterology;  Laterality: N/A;   CORONARY ANGIOPLASTY     CORONARY ARTERY BYPASS GRAFT     IMPLANTABLE CARDIOVERTER DEFIBRILLATOR GENERATOR CHANGE N/A 05/05/2013   Procedure: IMPLANTABLE CARDIOVERTER DEFIBRILLATOR GENERATOR CHANGE;  Surgeon: Elspeth JAYSON Sage, MD;  Location[facility] CATH LAB;  Service: Cardiovascular;  Laterality: N/A;   INSERT / REPLACE / REMOVE PACEMAKER      POLYPECTOMY  03/04/2023   Procedure: POLYPECTOMY;  Surgeon: Therisa Bi, MD;  Location: [facility] ENDOSCOPY;  Service: Gastroenterology;;   RIGHT/LEFT HEART CATH AND CORONARY ANGIOGRAPHY N/A 03/01/2021   Procedure: RIGHT/LEFT HEART CATH AND CORONARY ANGIOGRAPHY;  Surgeon: Wonda Sharper, MD;  Location: [facility] INVASIVE CV LAB;  Service: Cardiovascular;  Laterality: N/A;   TEE WITHOUT CARDIOVERSION N/A 04/27/2021   Procedure: TRANSESOPHAGEAL ECHOCARDIOGRAM (TEE);  Surgeon: Lucas Dorise POUR, MD;  Location: [facility] OR;  Service: Open Heart Surgery;  Laterality: N/A;   VSD REPAIR      Family History  Problem Relation Age of Onset   Heart disease Father    Hypertension Maternal Grandmother    Cancer Maternal Grandmother    Hypertension Maternal Grandfather    Cancer Maternal Grandfather     Social History   Socioeconomic History   Marital status: Married    Spouse name: Delon   Number of children: 2   Years of education: 17   Highest education level: Bachelor's degree (e.g., BA, AB, BS)  Occupational History   Occupation: IT    Employer: [employer]  Tobacco Use   Smoking status: Never    Passive exposure: Past   Smokeless tobacco: Never  Vaping Use   Vaping status: Never Used  Substance and Sexual Activity   Alcohol use: No   Drug use: No   Sexual activity: Yes    Partners: Female  Other Topics Concern   Not on file  Social History Narrative   Has living will   Wife is health care POA--alternate is sister [facility]   Would accept resuscitation    No feeding tube if persistent cognitively unaware   Social Drivers of Health   Financial Resource Strain: Low Risk  (12/18/2023)   Overall Financial Resource Strain (CARDIA)    Difficulty of Paying Living Expenses: Not hard at all  Food Insecurity: No Food Insecurity (12/18/2023)   Hunger Vital Sign    Worried About Running Out of Food in the Last Year: Never true    Ran Out of Food in the Last Year: Never true  Transportation Needs:  No Transportation Needs (12/18/2023)   PRAPARE - Administrator, Civil Service (Medical): No    Lack of Transportation (Non-Medical): No  Physical Activity: Insufficiently Active (12/18/2023)   Exercise Vital Sign    Days of Exercise per Week: 3 days    Minutes of Exercise per Session: 20 min  Stress: No Stress Concern Present (12/18/2023)   [employer] of Occupational Health - Occupational Stress Questionnaire    Feeling of Stress: Only a little  Social Connections: Moderately Integrated (12/18/2023)   Social Connection and Isolation Panel    Frequency of Communication with Friends and Family: Twice a week  Frequency of Social Gatherings with Friends and Family: Once a week    Attends Religious Services: More than 4 times per year    Active Member of [employer] or Organizations: No    Attends Banker Meetings: Not on file    Marital Status: Married  Catering manager Violence: Not At Risk (02/17/2020)   Humiliation, Afraid, Rape, and Kick questionnaire    Fear of Current or Ex-Partner: No    Emotionally Abused: No    Physically Abused: No    Sexually Abused: No   Review of Systems  Constitutional:  Positive for fatigue.       Weight down a few pounds Careful with eating Wears seat belt  HENT:  Positive for hearing loss. Negative for dental problem and tinnitus.        Keeps up with dentist  Eyes:  Negative for visual disturbance.       No diplopia or unilateral vision loss  Respiratory:  Negative for cough, chest tightness and shortness of breath.   Cardiovascular:  Positive for palpitations. Negative for chest pain and leg swelling.  Gastrointestinal:  Positive for blood in stool.       Did go to proctologist about hemorrhoids --no action due to warfarin No heartburn  Endocrine: Negative for polydipsia and polyuria.  Genitourinary:  Negative for difficulty urinating and urgency.       No sexual problems  Musculoskeletal:  Negative for arthralgias,  back pain and joint swelling.  Skin:  Negative for rash.       No suspicious lesions  Allergic/Immunologic: Positive for environmental allergies. Negative for immunocompromised state.       Claritin  is effective  Neurological:  Negative for dizziness, syncope, light-headedness and headaches.  Hematological:  Negative for adenopathy.  Psychiatric/Behavioral:  Negative for dysphoric mood and sleep disturbance. The patient is not nervous/anxious.        Objective:   Physical Exam Constitutional:      Appearance: Normal appearance.  HENT:     Mouth/Throat:     Pharynx: No oropharyngeal exudate or posterior oropharyngeal erythema.  Eyes:     Conjunctiva/sclera: Conjunctivae normal.     Pupils: Pupils are equal, round, and reactive to light.  Cardiovascular:     Rate and Rhythm: Normal rate and regular rhythm.     Pulses: Normal pulses.     Heart sounds: No murmur heard.    No gallop.     Comments: Valve click Pulmonary:     Effort: Pulmonary effort is normal.     Breath sounds: Normal breath sounds. No wheezing or rales.  Abdominal:     Palpations: Abdomen is soft.     Tenderness: There is no abdominal tenderness.  Musculoskeletal:     Cervical back: Neck supple.     Right lower leg: No edema.     Left lower leg: No edema.  Lymphadenopathy:     Cervical: No cervical adenopathy.  Skin:    Findings: No lesion or rash.  Neurological:     General: No focal deficit present.     Mental Status: He is alert and oriented to person, place, and time.  Psychiatric:        Mood and Affect: Mood normal.        Behavior: Behavior normal.            Assessment & Plan:

## 2023-12-18 NOTE — Assessment & Plan Note (Signed)
 Has defibrillator in No meds currently

## 2023-12-18 NOTE — Patient Instructions (Signed)
Continue taking warfarin 1.5 tablets daily. Recheck INR in 6 weeks.  Be consistent with green (2 times per week)  [facility] [phone] 

## 2023-12-18 NOTE — Assessment & Plan Note (Signed)
 Doing well with jardiance  10, carvedilol  6.25 bid, entresto  49/51 bid, aldactone  25

## 2024-01-06 ENCOUNTER — Other Ambulatory Visit: Payer: Self-pay | Admitting: Cardiovascular Disease

## 2024-01-14 NOTE — Progress Notes (Signed)
 Remote ICD transmission.

## 2024-01-29 ENCOUNTER — Ambulatory Visit: Attending: Cardiovascular Disease

## 2024-01-29 DIAGNOSIS — Z7901 Long term (current) use of anticoagulants: Secondary | ICD-10-CM

## 2024-01-29 DIAGNOSIS — Z952 Presence of prosthetic heart valve: Secondary | ICD-10-CM

## 2024-01-29 DIAGNOSIS — I350 Nonrheumatic aortic (valve) stenosis: Secondary | ICD-10-CM

## 2024-01-29 LAB — POCT INR: INR: 1.9 — AB (ref 2.0–3.0)

## 2024-01-29 NOTE — Patient Instructions (Signed)
Continue taking warfarin 1.5 tablets daily. Recheck INR in 6 weeks.  Be consistent with green (2 times per week)  [facility] [phone] 

## 2024-02-06 ENCOUNTER — Ambulatory Visit: Payer: Self-pay | Admitting: Cardiology

## 2024-02-06 ENCOUNTER — Ambulatory Visit (INDEPENDENT_AMBULATORY_CARE_PROVIDER_SITE_OTHER): Payer: 59

## 2024-02-06 DIAGNOSIS — I255 Ischemic cardiomyopathy: Secondary | ICD-10-CM | POA: Diagnosis not present

## 2024-02-06 LAB — CUP PACEART REMOTE DEVICE CHECK
Battery Remaining Longevity: 36 mo
Battery Remaining Percentage: 36 %
Brady Statistic RV Percent Paced: 0 %
Date Time Interrogation Session: 20250918030100
HighPow Impedance: 57 Ohm
Implantable Lead Connection Status: 753985
Implantable Lead Implant Date: 20080214
Implantable Lead Location: 753860
Implantable Lead Model: 185
Implantable Lead Serial Number: 178017
Implantable Pulse Generator Implant Date: 20141216
Lead Channel Impedance Value: 655 Ohm
Lead Channel Pacing Threshold Amplitude: 1.2 V
Lead Channel Pacing Threshold Pulse Width: 0.4 ms
Lead Channel Setting Pacing Amplitude: 2.5 V
Lead Channel Setting Pacing Pulse Width: 0.4 ms
Lead Channel Setting Sensing Sensitivity: 0.6 mV
Pulse Gen Serial Number: 126781

## 2024-02-07 ENCOUNTER — Encounter: Payer: Self-pay | Admitting: Physician Assistant

## 2024-02-07 ENCOUNTER — Encounter

## 2024-02-07 ENCOUNTER — Telehealth: Admitting: Physician Assistant

## 2024-02-07 ENCOUNTER — Ambulatory Visit: Admitting: Physician Assistant

## 2024-02-07 VITALS — BP 105/63 | HR 90 | Temp 98.5°F | Ht 69.5 in | Wt 218.0 lb

## 2024-02-07 DIAGNOSIS — R051 Acute cough: Secondary | ICD-10-CM

## 2024-02-07 DIAGNOSIS — J029 Acute pharyngitis, unspecified: Secondary | ICD-10-CM

## 2024-02-07 DIAGNOSIS — I10 Essential (primary) hypertension: Secondary | ICD-10-CM

## 2024-02-07 MED ORDER — BENZONATATE 100 MG PO CAPS: 100.0000 mg | ORAL_CAPSULE | Freq: Two times a day (BID) | ORAL | 0 refills | Status: AC | PRN

## 2024-02-07 NOTE — Progress Notes (Signed)
 I have spent 5 minutes in review of e-visit questionnaire, review and updating patient chart, medical decision making and response to patient.   Laure Kidney, PA-C

## 2024-02-07 NOTE — Progress Notes (Signed)

## 2024-02-07 NOTE — Progress Notes (Signed)
 Established patient visit  Patient: [name]   DOB: [birth year]   48 y.o. Male  MRN: [id number] Visit Date: 02/07/2024  Today's healthcare provider: Jolynn Spencer, PA-C   Chief Complaint  Patient presents with   Cough           Sore Throat   Sinus Problem    Patient relates for 2-3 days he has had a non productive cough, sinus congestion, post nasal drainage causing sore throat.  He states the cough is keeping him up at night and he is unable to get sleep.  He believes he has history of allergy symptoms and thinks this started with allergies./   Subjective     HPI     Cough    Additional comments:             Sinus Problem    Additional comments: Patient relates for 2-3 days he has had a non productive cough, sinus congestion, post nasal drainage causing sore throat.  He states the cough is keeping him up at night and he is unable to get sleep.  He believes he has history of allergy symptoms and thinks this started with allergies./      Last edited by Kathi Buel BIRCH, CMA on 02/07/2024  2:11 PM.       Discussed the use of AI scribe software for clinical note transcription with the patient, who gave verbal consent to proceed.  History of Present Illness [name] is a 48 year old male with heart failure, tachycardia, and hypertension who presents with cough and sore throat.  He experiences a persistent cough and sore throat, with the cough diminishing over time. He denies shortness of breath, chest pain, fever, or significant congestion, though he notes mild congestion. He has been in contact with coworkers with similar symptoms, suspected to be allergies. He is concerned about a viral infection and requests COVID-19 and flu testing.  He has a history of acute asthma, which has not been problematic recently. He does not experience seasonal allergies or sinusitis at this time of year. His medical history includes heart failure, tachycardia, and hypertension,  which are important considerations for medication management.       12/18/2023    8:54 AM 08/29/2023   10:24 AM 11/28/2022   11:39 AM  Depression screen PHQ 2/9  Decreased Interest 0 0 0  Down, Depressed, Hopeless 0 0 0  PHQ - 2 Score 0 0 0      06/19/2018   10:08 AM  GAD 7 : Generalized Anxiety Score  Nervous, Anxious, on Edge 0  Control/stop worrying 0  Worry too much - different things 0  Trouble relaxing 0  Restless 0  Easily annoyed or irritable 0  Afraid - awful might happen 0  Total GAD 7 Score 0  Anxiety Difficulty Not difficult at all    Medications: Outpatient Medications Prior to Visit  Medication Sig   acetaminophen  (TYLENOL ) 500 MG tablet Take 500 mg by mouth every 6 (six) hours as needed for moderate pain.   amoxicillin  (AMOXIL ) 500 MG capsule Take 4 capsules by mouth one hour prior to dental exam   aspirin  EC 81 MG EC tablet Take 1 tablet (81 mg total) by mouth daily. Swallow whole.   carvedilol  (COREG ) 6.25 MG tablet TAKE 1 TABLET TWICE A DAY WITH MEALS   empagliflozin  (JARDIANCE ) 10 MG TABS tablet Take 1 tablet (10 mg total) by mouth daily before breakfast.   hydrocortisone  2.5 %  cream Apply topically 3 (three) times daily as needed.   loratadine  (CLARITIN ) 10 MG tablet Take by mouth as needed.   MAGOX 400 400 (240 Mg) MG tablet TAKE 1 TABLET TWICE A DAY   meclizine  (ANTIVERT ) 25 MG tablet Take 0.5-1 tablets (12.5-25 mg total) by mouth 3 (three) times daily as needed for dizziness.   Multiple Vitamin (MULTIVITAMIN) tablet Take 2 tablets by mouth daily.   ondansetron  (ZOFRAN -ODT) 4 MG disintegrating tablet Take 1 tablet (4 mg total) by mouth 3 (three) times daily as needed.   ranolazine  (RANEXA ) 1000 MG SR tablet TAKE 1 TABLET TWICE A DAY   sacubitril -valsartan  (ENTRESTO ) 49-51 MG Take 1 tablet by mouth 2 (two) times daily.   spironolactone  (ALDACTONE ) 25 MG tablet TAKE 1 TABLET (25 MG TOTAL) BY MOUTH DAILY.   triamcinolone  cream (KENALOG ) 0.1 % Apply 1  Application topically as needed.   warfarin (COUMADIN ) 5 MG tablet TAKE ONE AND ONE-HALF TABLETS DAILY OR AS DIRECTED BY THE COUMADIN  CLINIC   No facility-administered medications prior to visit.    Review of Systems  All other systems reviewed and are negative.  All negative Except see HPI       Objective    BP 105/63 (BP Location: Right Arm, Patient Position: Sitting, Cuff Size: Large)   Pulse 90   Temp 98.5 F (36.9 C) (Oral)   Ht 5' 9.5 (1.765 m)   Wt 218 lb (98.9 kg)   SpO2 98%   BMI 31.73 kg/m     Physical Exam Vitals reviewed.  Constitutional:      General: He is in acute distress.     Appearance: Normal appearance. He is not ill-appearing, toxic-appearing or diaphoretic.  HENT:     Head: Normocephalic and atraumatic.     Right Ear: Tympanic membrane, ear canal and external ear normal.     Left Ear: Tympanic membrane, ear canal and external ear normal.     Nose: Congestion and rhinorrhea present.     Mouth/Throat:     Pharynx: Posterior oropharyngeal erythema present.  Eyes:     General: No scleral icterus.       Right eye: No discharge.        Left eye: No discharge.     Extraocular Movements: Extraocular movements intact.     Conjunctiva/sclera: Conjunctivae normal.     Pupils: Pupils are equal, round, and reactive to light.  Cardiovascular:     Rate and Rhythm: Normal rate and regular rhythm.     Pulses: Normal pulses.     Heart sounds: Normal heart sounds. No murmur heard. Pulmonary:     Effort: Pulmonary effort is normal. No respiratory distress.     Breath sounds: Normal breath sounds. No wheezing or rhonchi.  Abdominal:     General: Abdomen is flat. Bowel sounds are normal.     Palpations: Abdomen is soft.  Musculoskeletal:        General: Normal range of motion.     Cervical back: Normal range of motion and neck supple.     Right lower leg: No edema.     Left lower leg: No edema.  Lymphadenopathy:     Cervical: No cervical adenopathy.   Skin:    General: Skin is warm and dry.     Findings: No rash.  Neurological:     General: No focal deficit present.     Mental Status: He is alert and oriented to person, place, and time. Mental status is at baseline.  Psychiatric:        Behavior: Behavior normal.        Thought Content: Thought content normal.      No results found for any visits on 02/07/24.      Assessment & Plan Acute upper respiratory infection Likely viral etiology with cough, mild congestion, and sore throat. Differential includes sinus infection and allergies, but presentation aligns with viral infection. - Recommend OTC antihistamines: Allegra, Claritin , or Zyrtec. - Advise nasal saline spray followed by Flonase  for congestion. - Suggest warm salt water gargles and hot tea with honey for sore throat. - Recommend Tylenol  and ibuprofen  for fever and malaise, with meals. - Prescribe Tessalon  Perles for cough; Mucinex as alternative. - Advise rest and hydration. - Provide work note for absence until Tuesday.  Hypertension Chronic and stable Potential impact from decongestants discussed. - Avoid decongestants unless necessary due to blood pressure impact. Continue lifestyle modifications Will follow-up  No orders of the defined types were placed in this encounter.   No follow-ups on file.   The patient was advised to call back or seek an in-person evaluation if the symptoms worsen or if the condition fails to improve as anticipated.  I discussed the assessment and treatment plan with the patient. The patient was provided an opportunity to ask questions and all were answered. The patient agreed with the plan and demonstrated an understanding of the instructions.  I, Friedrich Harriott, PA-C have reviewed all documentation for this visit. The documentation on 02/07/2024  for the exam, diagnosis, procedures, and orders are all accurate and complete.  Jolynn Spencer, [facility], MMS [facility] [phone] (phone) [phone] (fax)  [facility]

## 2024-02-09 ENCOUNTER — Encounter: Payer: Self-pay | Admitting: Physician Assistant

## 2024-02-11 NOTE — Progress Notes (Signed)
Remote ICD Transmission.

## 2024-02-19 ENCOUNTER — Other Ambulatory Visit: Payer: Self-pay

## 2024-02-19 DIAGNOSIS — I429 Cardiomyopathy, unspecified: Secondary | ICD-10-CM

## 2024-02-20 MED ORDER — EMPAGLIFLOZIN 10 MG PO TABS: 10.0000 mg | ORAL_TABLET | Freq: Every day | ORAL | 2 refills | Status: AC

## 2024-03-11 ENCOUNTER — Ambulatory Visit

## 2024-03-18 ENCOUNTER — Ambulatory Visit

## 2024-03-18 ENCOUNTER — Ambulatory Visit: Attending: Cardiovascular Disease

## 2024-03-18 DIAGNOSIS — I350 Nonrheumatic aortic (valve) stenosis: Secondary | ICD-10-CM | POA: Diagnosis not present

## 2024-03-18 DIAGNOSIS — Z952 Presence of prosthetic heart valve: Secondary | ICD-10-CM | POA: Diagnosis not present

## 2024-03-18 DIAGNOSIS — Z7901 Long term (current) use of anticoagulants: Secondary | ICD-10-CM

## 2024-03-18 LAB — POCT INR: INR: 1.9 — AB (ref 2.0–3.0)

## 2024-03-18 NOTE — Patient Instructions (Signed)
 Continue taking warfarin 1.5 tablets daily. Recheck INR in 7 weeks.  Be consistent with green (2 times per week)  Coumadin  Clinic [phone]

## 2024-05-06 ENCOUNTER — Ambulatory Visit: Attending: Cardiovascular Disease

## 2024-05-06 DIAGNOSIS — Z7901 Long term (current) use of anticoagulants: Secondary | ICD-10-CM | POA: Diagnosis not present

## 2024-05-06 DIAGNOSIS — I350 Nonrheumatic aortic (valve) stenosis: Secondary | ICD-10-CM | POA: Diagnosis not present

## 2024-05-06 DIAGNOSIS — Z952 Presence of prosthetic heart valve: Secondary | ICD-10-CM

## 2024-05-06 LAB — POCT INR: INR: 1.6 — AB (ref 2.0–3.0)

## 2024-05-06 NOTE — Patient Instructions (Signed)
 Continue taking warfarin 1.5 tablets daily. Recheck INR in 8 weeks.  Be consistent with green (2 times per week)  Coumadin  Clinic [phone]

## 2024-05-07 ENCOUNTER — Ambulatory Visit: Payer: 59

## 2024-05-07 DIAGNOSIS — I255 Ischemic cardiomyopathy: Secondary | ICD-10-CM | POA: Diagnosis not present

## 2024-05-08 ENCOUNTER — Ambulatory Visit: Payer: Self-pay | Admitting: Cardiology

## 2024-05-08 LAB — CUP PACEART REMOTE DEVICE CHECK
Battery Remaining Longevity: 30 mo
Battery Remaining Percentage: 33 %
Brady Statistic RV Percent Paced: 0 %
Date Time Interrogation Session: 20251218030100
HighPow Impedance: 58 Ohm
Implantable Lead Connection Status: 753985
Implantable Lead Implant Date: 20080214
Implantable Lead Location: 753860
Implantable Lead Model: 185
Implantable Lead Serial Number: 178017
Implantable Pulse Generator Implant Date: 20141216
Lead Channel Impedance Value: 636 Ohm
Lead Channel Pacing Threshold Amplitude: 1.2 V
Lead Channel Pacing Threshold Pulse Width: 0.4 ms
Lead Channel Setting Pacing Amplitude: 2.5 V
Lead Channel Setting Pacing Pulse Width: 0.4 ms
Lead Channel Setting Sensing Sensitivity: 0.6 mV
Pulse Gen Serial Number: 126781

## 2024-05-10 NOTE — Progress Notes (Signed)
 Remote ICD Transmission

## 2024-05-23 ENCOUNTER — Other Ambulatory Visit: Payer: Self-pay | Admitting: Cardiology

## 2024-07-01 ENCOUNTER — Ambulatory Visit

## 2024-12-18 ENCOUNTER — Encounter
